# Patient Record
Sex: Female | Born: 1945 | Race: White | Hispanic: No | Marital: Married | Smoking: Light tobacco smoker
Health system: Southern US, Community
[De-identification: ages and names within clinical notes are randomized; demographics above are authoritative.]

## PROBLEM LIST (undated history)

## (undated) DIAGNOSIS — I739 Peripheral vascular disease, unspecified: Secondary | ICD-10-CM

## (undated) DIAGNOSIS — I499 Cardiac arrhythmia, unspecified: Secondary | ICD-10-CM

## (undated) DIAGNOSIS — Z8719 Personal history of other diseases of the digestive system: Secondary | ICD-10-CM

## (undated) DIAGNOSIS — N270 Small kidney, unilateral: Secondary | ICD-10-CM

## (undated) DIAGNOSIS — Z720 Tobacco use: Secondary | ICD-10-CM

## (undated) DIAGNOSIS — Z87442 Personal history of urinary calculi: Secondary | ICD-10-CM

## (undated) DIAGNOSIS — M1990 Unspecified osteoarthritis, unspecified site: Secondary | ICD-10-CM

## (undated) DIAGNOSIS — J189 Pneumonia, unspecified organism: Secondary | ICD-10-CM

## (undated) DIAGNOSIS — K219 Gastro-esophageal reflux disease without esophagitis: Secondary | ICD-10-CM

## (undated) DIAGNOSIS — I10 Essential (primary) hypertension: Secondary | ICD-10-CM

## (undated) HISTORY — PX: OTHER SURGICAL HISTORY: SHX169

## (undated) HISTORY — DX: Essential (primary) hypertension: I10

---

## 1967-10-17 HISTORY — PX: ECTOPIC PREGNANCY SURGERY: SHX613

## 1967-10-17 HISTORY — PX: APPENDECTOMY: SHX54

## 1968-10-16 HISTORY — PX: BREAST BIOPSY: SHX20

## 2006-04-25 ENCOUNTER — Emergency Department: Payer: Self-pay | Admitting: Emergency Medicine

## 2006-04-26 ENCOUNTER — Emergency Department: Payer: Self-pay | Admitting: Emergency Medicine

## 2006-05-03 ENCOUNTER — Ambulatory Visit: Payer: Self-pay | Admitting: Urology

## 2006-05-24 ENCOUNTER — Ambulatory Visit: Payer: Self-pay | Admitting: Urology

## 2006-09-12 ENCOUNTER — Ambulatory Visit: Payer: Self-pay | Admitting: Urology

## 2006-09-25 ENCOUNTER — Emergency Department: Payer: Self-pay | Admitting: General Practice

## 2007-06-12 ENCOUNTER — Ambulatory Visit: Payer: Self-pay | Admitting: Urology

## 2007-07-01 ENCOUNTER — Emergency Department: Payer: Self-pay | Admitting: Emergency Medicine

## 2011-01-16 ENCOUNTER — Ambulatory Visit: Payer: Self-pay | Admitting: Urology

## 2011-10-09 ENCOUNTER — Other Ambulatory Visit: Payer: Self-pay | Admitting: Internal Medicine

## 2011-11-08 ENCOUNTER — Ambulatory Visit: Payer: Self-pay | Admitting: Internal Medicine

## 2014-02-02 ENCOUNTER — Ambulatory Visit: Payer: Self-pay | Admitting: Physician Assistant

## 2014-02-16 ENCOUNTER — Other Ambulatory Visit: Payer: Self-pay | Admitting: Physician Assistant

## 2014-02-16 LAB — COMPREHENSIVE METABOLIC PANEL
Albumin: 3.9 g/dL (ref 3.4–5.0)
Alkaline Phosphatase: 99 U/L
Anion Gap: 2 — ABNORMAL LOW (ref 7–16)
BUN: 13 mg/dL (ref 7–18)
Bilirubin,Total: 0.3 mg/dL (ref 0.2–1.0)
Calcium, Total: 9.4 mg/dL (ref 8.5–10.1)
Chloride: 108 mmol/L — ABNORMAL HIGH (ref 98–107)
Co2: 29 mmol/L (ref 21–32)
Creatinine: 1.09 mg/dL (ref 0.60–1.30)
EGFR (African American): >60
EGFR (Non-African Amer.): 52 — ABNORMAL LOW
Glucose: 90 mg/dL (ref 65–99)
Osmolality: 277 (ref 275–301)
Potassium: 4.5 mmol/L (ref 3.5–5.1)
SGOT(AST): 21 U/L (ref 15–37)
SGPT (ALT): 15 U/L (ref 12–78)
Sodium: 139 mmol/L (ref 136–145)
Total Protein: 7.5 g/dL (ref 6.4–8.2)

## 2014-02-16 LAB — CBC WITH DIFFERENTIAL/PLATELET
Basophil #: 0 10*3/uL (ref 0.0–0.1)
Basophil %: 0.5 %
Eosinophil #: 0.2 10*3/uL (ref 0.0–0.7)
Eosinophil %: 3 %
HCT: 43.2 % (ref 35.0–47.0)
HGB: 14.5 g/dL (ref 12.0–16.0)
Lymphocyte #: 1.6 10*3/uL (ref 1.0–3.6)
Lymphocyte %: 22.8 %
MCH: 31.7 pg (ref 26.0–34.0)
MCHC: 33.6 g/dL (ref 32.0–36.0)
MCV: 94 fL (ref 80–100)
Monocyte #: 0.4 x10 3/mm (ref 0.2–0.9)
Monocyte %: 5.9 %
Neutrophil #: 4.7 10*3/uL (ref 1.4–6.5)
Neutrophil %: 67.8 %
Platelet: 267 10*3/uL (ref 150–440)
RBC: 4.59 10*6/uL (ref 3.80–5.20)
RDW: 14.3 % (ref 11.5–14.5)
WBC: 6.9 10*3/uL (ref 3.6–11.0)

## 2014-02-16 LAB — TSH: Thyroid Stimulating Horm: 1.67 u[IU]/mL

## 2014-02-20 LAB — LIPID PANEL
Cholesterol: 246 mg/dL — ABNORMAL HIGH (ref 0–200)
HDL Cholesterol: 60 mg/dL (ref 40–60)
Ldl Cholesterol, Calc: 168 mg/dL — ABNORMAL HIGH (ref 0–100)
Triglycerides: 90 mg/dL (ref 0–200)
VLDL Cholesterol, Calc: 18 mg/dL (ref 5–40)

## 2014-06-27 ENCOUNTER — Emergency Department: Payer: Self-pay | Admitting: Emergency Medicine

## 2014-06-27 LAB — COMPREHENSIVE METABOLIC PANEL
Albumin: 3.7 g/dL (ref 3.4–5.0)
Alkaline Phosphatase: 96 U/L
Anion Gap: 9 (ref 7–16)
BUN: 21 mg/dL — ABNORMAL HIGH (ref 7–18)
Bilirubin,Total: 0.3 mg/dL (ref 0.2–1.0)
Calcium, Total: 9.1 mg/dL (ref 8.5–10.1)
Chloride: 111 mmol/L — ABNORMAL HIGH (ref 98–107)
Co2: 20 mmol/L — ABNORMAL LOW (ref 21–32)
Creatinine: 0.95 mg/dL (ref 0.60–1.30)
EGFR (African American): >60
EGFR (Non-African Amer.): >60
Glucose: 106 mg/dL — ABNORMAL HIGH (ref 65–99)
Osmolality: 283 (ref 275–301)
Potassium: 3.9 mmol/L (ref 3.5–5.1)
SGOT(AST): 24 U/L (ref 15–37)
SGPT (ALT): 18 U/L
Sodium: 140 mmol/L (ref 136–145)
Total Protein: 7.5 g/dL (ref 6.4–8.2)

## 2014-06-27 LAB — CBC WITH DIFFERENTIAL/PLATELET
Basophil #: 0 10*3/uL (ref 0.0–0.1)
Basophil %: 0.3 %
Eosinophil #: 0.1 10*3/uL (ref 0.0–0.7)
Eosinophil %: 0.5 %
HCT: 42.2 % (ref 35.0–47.0)
HGB: 13.5 g/dL (ref 12.0–16.0)
Lymphocyte #: 1.2 10*3/uL (ref 1.0–3.6)
Lymphocyte %: 10 %
MCH: 30.5 pg (ref 26.0–34.0)
MCHC: 32 g/dL (ref 32.0–36.0)
MCV: 95 fL (ref 80–100)
Monocyte #: 0.5 x10 3/mm (ref 0.2–0.9)
Monocyte %: 4.1 %
Neutrophil #: 10 10*3/uL — ABNORMAL HIGH (ref 1.4–6.5)
Neutrophil %: 85.1 %
Platelet: 241 10*3/uL (ref 150–440)
RBC: 4.44 10*6/uL (ref 3.80–5.20)
RDW: 13.4 % (ref 11.5–14.5)
WBC: 11.8 10*3/uL — ABNORMAL HIGH (ref 3.6–11.0)

## 2014-06-27 LAB — D-DIMER(ARMC): D-Dimer: 730 ng/ml

## 2014-07-01 ENCOUNTER — Ambulatory Visit: Payer: Self-pay | Admitting: Vascular Surgery

## 2014-07-01 LAB — BASIC METABOLIC PANEL
Anion Gap: 7 (ref 7–16)
BUN: 11 mg/dL (ref 7–18)
Calcium, Total: 8.9 mg/dL (ref 8.5–10.1)
Chloride: 107 mmol/L (ref 98–107)
Co2: 25 mmol/L (ref 21–32)
Creatinine: 0.96 mg/dL (ref 0.60–1.30)
EGFR (African American): >60
EGFR (Non-African Amer.): >60
Glucose: 85 mg/dL (ref 65–99)
Osmolality: 276 (ref 275–301)
Potassium: 3.5 mmol/L (ref 3.5–5.1)
Sodium: 139 mmol/L (ref 136–145)

## 2014-07-02 LAB — CBC WITH DIFFERENTIAL/PLATELET
Basophil #: 0 10*3/uL (ref 0.0–0.1)
Basophil %: 0.3 %
Eosinophil #: 0.1 10*3/uL (ref 0.0–0.7)
Eosinophil %: 0.8 %
HCT: 33.7 % — ABNORMAL LOW (ref 35.0–47.0)
HGB: 11.4 g/dL — ABNORMAL LOW (ref 12.0–16.0)
Lymphocyte #: 1.2 10*3/uL (ref 1.0–3.6)
Lymphocyte %: 11.8 %
MCH: 31.8 pg (ref 26.0–34.0)
MCHC: 33.8 g/dL (ref 32.0–36.0)
MCV: 94 fL (ref 80–100)
Monocyte #: 0.6 x10 3/mm (ref 0.2–0.9)
Monocyte %: 5.8 %
Neutrophil #: 8.1 10*3/uL — ABNORMAL HIGH (ref 1.4–6.5)
Neutrophil %: 81.3 %
Platelet: 208 10*3/uL (ref 150–440)
RBC: 3.6 10*6/uL — ABNORMAL LOW (ref 3.80–5.20)
RDW: 13.4 % (ref 11.5–14.5)
WBC: 9.9 10*3/uL (ref 3.6–11.0)

## 2014-07-02 LAB — BASIC METABOLIC PANEL
Anion Gap: 6 — ABNORMAL LOW (ref 7–16)
BUN: 9 mg/dL (ref 7–18)
Calcium, Total: 8.5 mg/dL (ref 8.5–10.1)
Chloride: 110 mmol/L — ABNORMAL HIGH (ref 98–107)
Co2: 24 mmol/L (ref 21–32)
Creatinine: 0.74 mg/dL (ref 0.60–1.30)
EGFR (African American): >60
EGFR (Non-African Amer.): >60
Glucose: 98 mg/dL (ref 65–99)
Osmolality: 278 (ref 275–301)
Potassium: 3.4 mmol/L — ABNORMAL LOW (ref 3.5–5.1)
Sodium: 140 mmol/L (ref 136–145)

## 2014-07-22 ENCOUNTER — Ambulatory Visit: Payer: Self-pay | Admitting: Vascular Surgery

## 2014-07-22 LAB — CREATININE, SERUM
Creatinine: 1 mg/dL (ref 0.60–1.30)
EGFR (African American): >60
EGFR (Non-African Amer.): 58 — ABNORMAL LOW

## 2014-07-22 LAB — BUN: BUN: 13 mg/dL (ref 7–18)

## 2014-07-23 LAB — CBC WITH DIFFERENTIAL/PLATELET
Basophil #: 0 10*3/uL (ref 0.0–0.1)
Basophil %: 0.4 %
Eosinophil #: 0.1 10*3/uL (ref 0.0–0.7)
Eosinophil %: 0.5 %
HCT: 32.6 % — ABNORMAL LOW (ref 35.0–47.0)
HGB: 10.8 g/dL — ABNORMAL LOW (ref 12.0–16.0)
Lymphocyte #: 1.2 10*3/uL (ref 1.0–3.6)
Lymphocyte %: 10.2 %
MCH: 31.6 pg (ref 26.0–34.0)
MCHC: 33.3 g/dL (ref 32.0–36.0)
MCV: 95 fL (ref 80–100)
Monocyte #: 0.7 x10 3/mm (ref 0.2–0.9)
Monocyte %: 5.6 %
Neutrophil #: 10.1 10*3/uL — ABNORMAL HIGH (ref 1.4–6.5)
Neutrophil %: 83.3 %
Platelet: 260 10*3/uL (ref 150–440)
RBC: 3.42 10*6/uL — ABNORMAL LOW (ref 3.80–5.20)
RDW: 13.6 % (ref 11.5–14.5)
WBC: 12.1 10*3/uL — ABNORMAL HIGH (ref 3.6–11.0)

## 2014-07-23 LAB — BASIC METABOLIC PANEL
Anion Gap: 6 — ABNORMAL LOW (ref 7–16)
BUN: 16 mg/dL (ref 7–18)
Calcium, Total: 7.9 mg/dL — ABNORMAL LOW (ref 8.5–10.1)
Chloride: 106 mmol/L (ref 98–107)
Co2: 28 mmol/L (ref 21–32)
Creatinine: 1.09 mg/dL (ref 0.60–1.30)
EGFR (African American): >60
EGFR (Non-African Amer.): 53 — ABNORMAL LOW
Glucose: 94 mg/dL (ref 65–99)
Osmolality: 280 (ref 275–301)
Potassium: 4.2 mmol/L (ref 3.5–5.1)
Sodium: 140 mmol/L (ref 136–145)

## 2014-08-20 ENCOUNTER — Ambulatory Visit: Payer: Self-pay | Admitting: Vascular Surgery

## 2014-08-20 LAB — CREATININE, SERUM
Creatinine: 1.19 mg/dL (ref 0.60–1.30)
EGFR (African American): 58 — ABNORMAL LOW
EGFR (Non-African Amer.): 48 — ABNORMAL LOW

## 2014-08-20 LAB — BUN: BUN: 13 mg/dL (ref 7–18)

## 2014-10-27 DIAGNOSIS — F17210 Nicotine dependence, cigarettes, uncomplicated: Secondary | ICD-10-CM | POA: Diagnosis not present

## 2014-10-27 DIAGNOSIS — R011 Cardiac murmur, unspecified: Secondary | ICD-10-CM | POA: Diagnosis not present

## 2014-10-27 DIAGNOSIS — I10 Essential (primary) hypertension: Secondary | ICD-10-CM | POA: Diagnosis not present

## 2014-10-27 DIAGNOSIS — I70223 Atherosclerosis of native arteries of extremities with rest pain, bilateral legs: Secondary | ICD-10-CM | POA: Diagnosis not present

## 2014-11-06 ENCOUNTER — Inpatient Hospital Stay: Payer: Self-pay | Admitting: Vascular Surgery

## 2014-11-06 DIAGNOSIS — I743 Embolism and thrombosis of arteries of the lower extremities: Secondary | ICD-10-CM | POA: Diagnosis not present

## 2014-11-06 DIAGNOSIS — Z95820 Peripheral vascular angioplasty status with implants and grafts: Secondary | ICD-10-CM | POA: Diagnosis not present

## 2014-11-06 DIAGNOSIS — F17210 Nicotine dependence, cigarettes, uncomplicated: Secondary | ICD-10-CM | POA: Diagnosis not present

## 2014-11-06 DIAGNOSIS — Z87442 Personal history of urinary calculi: Secondary | ICD-10-CM | POA: Diagnosis not present

## 2014-11-06 DIAGNOSIS — K219 Gastro-esophageal reflux disease without esophagitis: Secondary | ICD-10-CM | POA: Diagnosis not present

## 2014-11-06 DIAGNOSIS — Z7902 Long term (current) use of antithrombotics/antiplatelets: Secondary | ICD-10-CM | POA: Diagnosis not present

## 2014-11-06 DIAGNOSIS — Z7982 Long term (current) use of aspirin: Secondary | ICD-10-CM | POA: Diagnosis not present

## 2014-11-06 DIAGNOSIS — I739 Peripheral vascular disease, unspecified: Secondary | ICD-10-CM | POA: Diagnosis not present

## 2014-11-06 DIAGNOSIS — M549 Dorsalgia, unspecified: Secondary | ICD-10-CM | POA: Diagnosis not present

## 2014-11-06 DIAGNOSIS — F17200 Nicotine dependence, unspecified, uncomplicated: Secondary | ICD-10-CM | POA: Diagnosis not present

## 2014-11-06 DIAGNOSIS — M79609 Pain in unspecified limb: Secondary | ICD-10-CM | POA: Diagnosis not present

## 2014-11-06 DIAGNOSIS — I70221 Atherosclerosis of native arteries of extremities with rest pain, right leg: Secondary | ICD-10-CM | POA: Diagnosis not present

## 2014-11-06 DIAGNOSIS — I10 Essential (primary) hypertension: Secondary | ICD-10-CM | POA: Diagnosis not present

## 2014-11-06 DIAGNOSIS — M79604 Pain in right leg: Secondary | ICD-10-CM | POA: Diagnosis not present

## 2014-11-06 DIAGNOSIS — M25559 Pain in unspecified hip: Secondary | ICD-10-CM | POA: Diagnosis not present

## 2014-11-06 DIAGNOSIS — I70223 Atherosclerosis of native arteries of extremities with rest pain, bilateral legs: Secondary | ICD-10-CM | POA: Diagnosis not present

## 2014-11-06 DIAGNOSIS — I70229 Atherosclerosis of native arteries of extremities with rest pain, unspecified extremity: Secondary | ICD-10-CM | POA: Diagnosis not present

## 2014-11-06 LAB — COMPREHENSIVE METABOLIC PANEL
Albumin: 3.5 g/dL (ref 3.4–5.0)
Alkaline Phosphatase: 119 U/L — ABNORMAL HIGH
Anion Gap: 11 (ref 7–16)
BUN: 22 mg/dL — ABNORMAL HIGH (ref 7–18)
Bilirubin,Total: 0.3 mg/dL (ref 0.2–1.0)
Calcium, Total: 9.2 mg/dL (ref 8.5–10.1)
Chloride: 98 mmol/L (ref 98–107)
Co2: 22 mmol/L (ref 21–32)
Creatinine: 1.13 mg/dL (ref 0.60–1.30)
EGFR (African American): >60
EGFR (Non-African Amer.): 51 — ABNORMAL LOW
Glucose: 177 mg/dL — ABNORMAL HIGH (ref 65–99)
Osmolality: 270 (ref 275–301)
Potassium: 3.6 mmol/L (ref 3.5–5.1)
SGOT(AST): 21 U/L (ref 15–37)
SGPT (ALT): 15 U/L
Sodium: 131 mmol/L — ABNORMAL LOW (ref 136–145)
Total Protein: 7.1 g/dL (ref 6.4–8.2)

## 2014-11-06 LAB — CBC
HCT: 37.4 % (ref 35.0–47.0)
HGB: 11.9 g/dL — ABNORMAL LOW (ref 12.0–16.0)
MCH: 28.3 pg (ref 26.0–34.0)
MCHC: 31.9 g/dL — ABNORMAL LOW (ref 32.0–36.0)
MCV: 89 fL (ref 80–100)
Platelet: 282 10*3/uL (ref 150–440)
RBC: 4.21 10*6/uL (ref 3.80–5.20)
RDW: 15.4 % — ABNORMAL HIGH (ref 11.5–14.5)
WBC: 14.5 10*3/uL — ABNORMAL HIGH (ref 3.6–11.0)

## 2014-11-06 LAB — PROTIME-INR
INR: 1.1
Prothrombin Time: 13.6 secs (ref 11.5–14.7)

## 2014-11-06 LAB — APTT
Activated PTT: 31.1 secs (ref 23.6–35.9)
Activated PTT: 42.2 secs — ABNORMAL HIGH (ref 23.6–35.9)

## 2014-11-06 LAB — HEPARIN LEVEL (UNFRACTIONATED): Anti-Xa(Unfractionated): >1.1 IU/mL (ref 0.30–0.70)

## 2014-11-07 LAB — CBC WITH DIFFERENTIAL/PLATELET
Basophil #: 0 10*3/uL (ref 0.0–0.1)
Basophil %: 0.2 %
Eosinophil #: 0 10*3/uL (ref 0.0–0.7)
Eosinophil %: 0.2 %
HCT: 31.9 % — ABNORMAL LOW (ref 35.0–47.0)
HGB: 10.3 g/dL — ABNORMAL LOW (ref 12.0–16.0)
Lymphocyte #: 0.7 10*3/uL — ABNORMAL LOW (ref 1.0–3.6)
Lymphocyte %: 5.7 %
MCH: 28.5 pg (ref 26.0–34.0)
MCHC: 32.2 g/dL (ref 32.0–36.0)
MCV: 89 fL (ref 80–100)
Monocyte #: 0.6 x10 3/mm (ref 0.2–0.9)
Monocyte %: 4.4 %
Neutrophil #: 11.7 10*3/uL — ABNORMAL HIGH (ref 1.4–6.5)
Neutrophil %: 89.5 %
Platelet: 190 10*3/uL (ref 150–440)
RBC: 3.6 10*6/uL — ABNORMAL LOW (ref 3.80–5.20)
RDW: 15.9 % — ABNORMAL HIGH (ref 11.5–14.5)
WBC: 13.1 10*3/uL — ABNORMAL HIGH (ref 3.6–11.0)

## 2014-11-07 LAB — BASIC METABOLIC PANEL
Anion Gap: 9 (ref 7–16)
BUN: 24 mg/dL — ABNORMAL HIGH (ref 7–18)
Calcium, Total: 8.6 mg/dL (ref 8.5–10.1)
Chloride: 104 mmol/L (ref 98–107)
Co2: 22 mmol/L (ref 21–32)
Creatinine: 1.02 mg/dL (ref 0.60–1.30)
EGFR (African American): >60
EGFR (Non-African Amer.): 57 — ABNORMAL LOW
Glucose: 107 mg/dL — ABNORMAL HIGH (ref 65–99)
Osmolality: 275 (ref 275–301)
Potassium: 4.1 mmol/L (ref 3.5–5.1)
Sodium: 135 mmol/L — ABNORMAL LOW (ref 136–145)

## 2014-11-08 LAB — CBC WITH DIFFERENTIAL/PLATELET
Basophil #: 0 10*3/uL (ref 0.0–0.1)
Basophil %: 0.4 %
Eosinophil #: 0.1 10*3/uL (ref 0.0–0.7)
Eosinophil %: 0.6 %
HCT: 26 % — ABNORMAL LOW (ref 35.0–47.0)
HGB: 8.4 g/dL — ABNORMAL LOW (ref 12.0–16.0)
Lymphocyte #: 1.1 10*3/uL (ref 1.0–3.6)
Lymphocyte %: 12.9 %
MCH: 28.9 pg (ref 26.0–34.0)
MCHC: 32.2 g/dL (ref 32.0–36.0)
MCV: 90 fL (ref 80–100)
Monocyte #: 0.7 x10 3/mm (ref 0.2–0.9)
Monocyte %: 8.1 %
Neutrophil #: 6.9 10*3/uL — ABNORMAL HIGH (ref 1.4–6.5)
Neutrophil %: 78 %
Platelet: 154 10*3/uL (ref 150–440)
RBC: 2.89 10*6/uL — ABNORMAL LOW (ref 3.80–5.20)
RDW: 15.8 % — ABNORMAL HIGH (ref 11.5–14.5)
WBC: 8.9 10*3/uL (ref 3.6–11.0)

## 2014-11-08 LAB — BASIC METABOLIC PANEL
Anion Gap: 8 (ref 7–16)
BUN: 16 mg/dL (ref 7–18)
Calcium, Total: 7.8 mg/dL — ABNORMAL LOW (ref 8.5–10.1)
Chloride: 108 mmol/L — ABNORMAL HIGH (ref 98–107)
Co2: 22 mmol/L (ref 21–32)
Creatinine: 1.13 mg/dL (ref 0.60–1.30)
EGFR (African American): >60
EGFR (Non-African Amer.): 51 — ABNORMAL LOW
Glucose: 83 mg/dL (ref 65–99)
Osmolality: 276 (ref 275–301)
Potassium: 3.2 mmol/L — ABNORMAL LOW (ref 3.5–5.1)
Sodium: 138 mmol/L (ref 136–145)

## 2014-11-27 DIAGNOSIS — I70213 Atherosclerosis of native arteries of extremities with intermittent claudication, bilateral legs: Secondary | ICD-10-CM | POA: Diagnosis not present

## 2014-11-27 DIAGNOSIS — I10 Essential (primary) hypertension: Secondary | ICD-10-CM | POA: Diagnosis not present

## 2014-11-27 DIAGNOSIS — F17200 Nicotine dependence, unspecified, uncomplicated: Secondary | ICD-10-CM | POA: Diagnosis not present

## 2015-01-07 ENCOUNTER — Inpatient Hospital Stay: Payer: Self-pay | Admitting: Vascular Surgery

## 2015-01-07 DIAGNOSIS — Z7902 Long term (current) use of antithrombotics/antiplatelets: Secondary | ICD-10-CM | POA: Diagnosis not present

## 2015-01-07 DIAGNOSIS — I70213 Atherosclerosis of native arteries of extremities with intermittent claudication, bilateral legs: Secondary | ICD-10-CM | POA: Diagnosis not present

## 2015-01-07 DIAGNOSIS — Z79891 Long term (current) use of opiate analgesic: Secondary | ICD-10-CM | POA: Diagnosis not present

## 2015-01-07 DIAGNOSIS — F4310 Post-traumatic stress disorder, unspecified: Secondary | ICD-10-CM | POA: Diagnosis not present

## 2015-01-07 DIAGNOSIS — D649 Anemia, unspecified: Secondary | ICD-10-CM | POA: Diagnosis not present

## 2015-01-07 DIAGNOSIS — E785 Hyperlipidemia, unspecified: Secondary | ICD-10-CM | POA: Diagnosis not present

## 2015-01-07 DIAGNOSIS — D500 Iron deficiency anemia secondary to blood loss (chronic): Secondary | ICD-10-CM | POA: Diagnosis not present

## 2015-01-07 DIAGNOSIS — I129 Hypertensive chronic kidney disease with stage 1 through stage 4 chronic kidney disease, or unspecified chronic kidney disease: Secondary | ICD-10-CM | POA: Diagnosis not present

## 2015-01-07 DIAGNOSIS — K219 Gastro-esophageal reflux disease without esophagitis: Secondary | ICD-10-CM | POA: Diagnosis not present

## 2015-01-07 DIAGNOSIS — N200 Calculus of kidney: Secondary | ICD-10-CM | POA: Diagnosis not present

## 2015-01-07 DIAGNOSIS — Z87891 Personal history of nicotine dependence: Secondary | ICD-10-CM | POA: Diagnosis not present

## 2015-01-07 DIAGNOSIS — S0990XA Unspecified injury of head, initial encounter: Secondary | ICD-10-CM | POA: Diagnosis not present

## 2015-01-07 DIAGNOSIS — Z79899 Other long term (current) drug therapy: Secondary | ICD-10-CM | POA: Diagnosis not present

## 2015-01-07 DIAGNOSIS — E780 Pure hypercholesterolemia: Secondary | ICD-10-CM | POA: Diagnosis not present

## 2015-01-07 DIAGNOSIS — I743 Embolism and thrombosis of arteries of the lower extremities: Secondary | ICD-10-CM | POA: Diagnosis not present

## 2015-01-07 DIAGNOSIS — Z87442 Personal history of urinary calculi: Secondary | ICD-10-CM | POA: Diagnosis not present

## 2015-01-07 DIAGNOSIS — N186 End stage renal disease: Secondary | ICD-10-CM | POA: Diagnosis not present

## 2015-01-07 DIAGNOSIS — Z452 Encounter for adjustment and management of vascular access device: Secondary | ICD-10-CM | POA: Diagnosis not present

## 2015-01-07 DIAGNOSIS — N183 Chronic kidney disease, stage 3 (moderate): Secondary | ICD-10-CM | POA: Diagnosis not present

## 2015-01-07 DIAGNOSIS — I998 Other disorder of circulatory system: Secondary | ICD-10-CM | POA: Diagnosis not present

## 2015-01-07 DIAGNOSIS — I70221 Atherosclerosis of native arteries of extremities with rest pain, right leg: Secondary | ICD-10-CM | POA: Diagnosis not present

## 2015-01-07 DIAGNOSIS — N189 Chronic kidney disease, unspecified: Secondary | ICD-10-CM | POA: Diagnosis not present

## 2015-01-07 DIAGNOSIS — S0181XA Laceration without foreign body of other part of head, initial encounter: Secondary | ICD-10-CM | POA: Diagnosis not present

## 2015-01-07 DIAGNOSIS — J984 Other disorders of lung: Secondary | ICD-10-CM | POA: Diagnosis not present

## 2015-01-07 DIAGNOSIS — I70222 Atherosclerosis of native arteries of extremities with rest pain, left leg: Secondary | ICD-10-CM | POA: Diagnosis not present

## 2015-01-07 DIAGNOSIS — Z7982 Long term (current) use of aspirin: Secondary | ICD-10-CM | POA: Diagnosis not present

## 2015-01-07 DIAGNOSIS — M1990 Unspecified osteoarthritis, unspecified site: Secondary | ICD-10-CM | POA: Diagnosis not present

## 2015-01-07 DIAGNOSIS — I10 Essential (primary) hypertension: Secondary | ICD-10-CM | POA: Diagnosis not present

## 2015-01-07 DIAGNOSIS — I70223 Atherosclerosis of native arteries of extremities with rest pain, bilateral legs: Secondary | ICD-10-CM | POA: Diagnosis not present

## 2015-01-07 DIAGNOSIS — I6522 Occlusion and stenosis of left carotid artery: Secondary | ICD-10-CM | POA: Diagnosis not present

## 2015-01-07 DIAGNOSIS — D62 Acute posthemorrhagic anemia: Secondary | ICD-10-CM | POA: Diagnosis not present

## 2015-01-07 DIAGNOSIS — N179 Acute kidney failure, unspecified: Secondary | ICD-10-CM | POA: Diagnosis not present

## 2015-01-07 DIAGNOSIS — I7779 Dissection of other artery: Secondary | ICD-10-CM | POA: Diagnosis not present

## 2015-01-07 DIAGNOSIS — Z8249 Family history of ischemic heart disease and other diseases of the circulatory system: Secondary | ICD-10-CM | POA: Diagnosis not present

## 2015-01-07 DIAGNOSIS — Z8673 Personal history of transient ischemic attack (TIA), and cerebral infarction without residual deficits: Secondary | ICD-10-CM | POA: Diagnosis not present

## 2015-01-07 DIAGNOSIS — I739 Peripheral vascular disease, unspecified: Secondary | ICD-10-CM | POA: Diagnosis not present

## 2015-01-07 LAB — BASIC METABOLIC PANEL WITH GFR
Anion Gap: 9
BUN: 25 mg/dL — ABNORMAL HIGH
Calcium, Total: 9.1 mg/dL
Chloride: 103 mmol/L
Co2: 20 mmol/L — ABNORMAL LOW
Creatinine: 1.25 mg/dL — ABNORMAL HIGH
EGFR (African American): 51 — ABNORMAL LOW
EGFR (Non-African Amer.): 44 — ABNORMAL LOW
Glucose: 116 mg/dL — ABNORMAL HIGH
Potassium: 3.5 mmol/L
Sodium: 132 mmol/L — ABNORMAL LOW

## 2015-01-07 LAB — CBC
HCT: 32.1 % — ABNORMAL LOW
HGB: 10.3 g/dL — ABNORMAL LOW
MCH: 28.5 pg
MCHC: 32 g/dL
MCV: 89 fL
Platelet: 265 x10 3/mm 3
RBC: 3.61 X10 6/mm 3 — ABNORMAL LOW
RDW: 15.3 % — ABNORMAL HIGH
WBC: 7.9 x10 3/mm 3

## 2015-01-07 LAB — PROTIME-INR
INR: 1
Prothrombin Time: 13.3 secs

## 2015-01-07 LAB — APTT: Activated PTT: 30.2 secs (ref 23.6–35.9)

## 2015-01-07 LAB — HEPARIN LEVEL (UNFRACTIONATED): Anti-Xa(Unfractionated): 0.38 IU/mL (ref 0.30–0.70)

## 2015-01-08 LAB — CBC WITH DIFFERENTIAL/PLATELET
Basophil #: 0 10*3/uL (ref 0.0–0.1)
Basophil #: 0 10*3/uL (ref 0.0–0.1)
Basophil %: 0.6 %
Basophil %: 0.6 %
Eosinophil #: 0.1 10*3/uL (ref 0.0–0.7)
Eosinophil #: 0.1 10*3/uL (ref 0.0–0.7)
Eosinophil %: 1.7 %
Eosinophil %: 1.9 %
HCT: 24.9 % — ABNORMAL LOW (ref 35.0–47.0)
HCT: 22.7 % — ABNORMAL LOW (ref 35.0–47.0)
HGB: 7.9 g/dL — ABNORMAL LOW (ref 12.0–16.0)
HGB: 7.3 g/dL — ABNORMAL LOW (ref 12.0–16.0)
Lymphocyte #: 1 10*3/uL (ref 1.0–3.6)
Lymphocyte #: 1.1 10*3/uL (ref 1.0–3.6)
Lymphocyte %: 15.4 %
Lymphocyte %: 18.9 %
MCH: 28.1 pg (ref 26.0–34.0)
MCH: 28.2 pg (ref 26.0–34.0)
MCHC: 31.8 g/dL — ABNORMAL LOW (ref 32.0–36.0)
MCHC: 32 g/dL (ref 32.0–36.0)
MCV: 88 fL (ref 80–100)
MCV: 88 fL (ref 80–100)
Monocyte #: 0.4 x10 3/mm (ref 0.2–0.9)
Monocyte #: 0.4 x10 3/mm (ref 0.2–0.9)
Monocyte %: 6.2 %
Monocyte %: 7 %
Neutrophil #: 4.9 10*3/uL (ref 1.4–6.5)
Neutrophil #: 4.1 10*3/uL (ref 1.4–6.5)
Neutrophil %: 76.1 %
Neutrophil %: 71.6 %
Platelet: 198 10*3/uL (ref 150–440)
Platelet: 180 10*3/uL (ref 150–440)
RBC: 2.81 10*6/uL — ABNORMAL LOW (ref 3.80–5.20)
RBC: 2.57 10*6/uL — ABNORMAL LOW (ref 3.80–5.20)
RDW: 15 % — ABNORMAL HIGH (ref 11.5–14.5)
RDW: 15.1 % — ABNORMAL HIGH (ref 11.5–14.5)
WBC: 6.4 10*3/uL (ref 3.6–11.0)
WBC: 5.7 10*3/uL (ref 3.6–11.0)

## 2015-01-08 LAB — BASIC METABOLIC PANEL
Anion Gap: 4 — ABNORMAL LOW (ref 7–16)
BUN: 20 mg/dL
Calcium, Total: 8.2 mg/dL — ABNORMAL LOW
Chloride: 105 mmol/L
Co2: 25 mmol/L
Creatinine: 1.14 mg/dL — ABNORMAL HIGH
EGFR (African American): 57 — ABNORMAL LOW
EGFR (Non-African Amer.): 49 — ABNORMAL LOW
Glucose: 97 mg/dL
Potassium: 3.5 mmol/L
Sodium: 134 mmol/L — ABNORMAL LOW

## 2015-01-08 LAB — HEPARIN LEVEL (UNFRACTIONATED): Anti-Xa(Unfractionated): 0.58 IU/mL (ref 0.30–0.70)

## 2015-01-09 LAB — CBC WITH DIFFERENTIAL/PLATELET
Basophil #: 0 10*3/uL (ref 0.0–0.1)
Basophil %: 0.3 %
Eosinophil #: 0 10*3/uL (ref 0.0–0.7)
Eosinophil %: 0.3 %
HCT: 20.3 % — ABNORMAL LOW (ref 35.0–47.0)
HGB: 6.5 g/dL — ABNORMAL LOW (ref 12.0–16.0)
Lymphocyte #: 1.3 10*3/uL (ref 1.0–3.6)
Lymphocyte %: 18.4 %
MCH: 28.3 pg (ref 26.0–34.0)
MCHC: 32 g/dL (ref 32.0–36.0)
MCV: 88 fL (ref 80–100)
Monocyte #: 0.5 x10 3/mm (ref 0.2–0.9)
Monocyte %: 7.3 %
Neutrophil #: 5.3 10*3/uL (ref 1.4–6.5)
Neutrophil %: 73.7 %
Platelet: 171 10*3/uL (ref 150–440)
RBC: 2.3 10*6/uL — ABNORMAL LOW (ref 3.80–5.20)
RDW: 15 % — ABNORMAL HIGH (ref 11.5–14.5)
WBC: 7.2 10*3/uL (ref 3.6–11.0)

## 2015-01-09 LAB — URINALYSIS, COMPLETE
Bilirubin,UR: NEGATIVE
Glucose,UR: NEGATIVE mg/dL (ref 0–75)
Hyaline Cast: 2
Nitrite: NEGATIVE
Ph: 5 (ref 4.5–8.0)
Protein: NEGATIVE
RBC,UR: 5 /HPF (ref 0–5)
Specific Gravity: 1.012 (ref 1.003–1.030)
Squamous Epithelial: NONE SEEN
WBC UR: 12 /HPF (ref 0–5)

## 2015-01-09 LAB — PROTEIN / CREATININE RATIO, URINE
Creatinine, Urine Random: 63 mg/dL (ref 30–125)
Protein, Urine: <6 mg/dL — ABNORMAL LOW (ref 0–9)

## 2015-01-09 LAB — HEPARIN LEVEL (UNFRACTIONATED)
Anti-Xa(Unfractionated): 0.13 IU/mL — ABNORMAL LOW (ref 0.30–0.70)
Anti-Xa(Unfractionated): 0.29 IU/mL — ABNORMAL LOW (ref 0.30–0.70)

## 2015-01-10 LAB — CBC WITH DIFFERENTIAL/PLATELET
Basophil #: 0 10*3/uL (ref 0.0–0.1)
Basophil %: 0.6 %
Eosinophil #: 0.3 10*3/uL (ref 0.0–0.7)
Eosinophil %: 3.9 %
HCT: 25.7 % — ABNORMAL LOW (ref 35.0–47.0)
HGB: 8.5 g/dL — ABNORMAL LOW (ref 12.0–16.0)
Lymphocyte #: 1.6 10*3/uL (ref 1.0–3.6)
Lymphocyte %: 23.4 %
MCH: 28.4 pg (ref 26.0–34.0)
MCHC: 33.1 g/dL (ref 32.0–36.0)
MCV: 86 fL (ref 80–100)
Monocyte #: 0.5 x10 3/mm (ref 0.2–0.9)
Monocyte %: 7.9 %
Neutrophil #: 4.4 10*3/uL (ref 1.4–6.5)
Neutrophil %: 64.2 %
Platelet: 144 10*3/uL — ABNORMAL LOW (ref 150–440)
RBC: 3 10*6/uL — ABNORMAL LOW (ref 3.80–5.20)
RDW: 16.3 % — ABNORMAL HIGH (ref 11.5–14.5)
WBC: 6.9 10*3/uL (ref 3.6–11.0)

## 2015-01-10 LAB — HEPARIN LEVEL (UNFRACTIONATED)
Anti-Xa(Unfractionated): 0.52 IU/mL (ref 0.30–0.70)
Anti-Xa(Unfractionated): 0.35 IU/mL (ref 0.30–0.70)

## 2015-01-10 LAB — PROTIME-INR
INR: 1.2
Prothrombin Time: 15 secs

## 2015-01-10 LAB — CREATININE, SERUM
Creatinine: 0.84 mg/dL
EGFR (African American): >60
EGFR (Non-African Amer.): >60

## 2015-01-11 LAB — HEPATIC FUNCTION PANEL A (ARMC)
Albumin: 2.9 g/dL — ABNORMAL LOW
Albumin: 3 g/dL — ABNORMAL LOW
Alkaline Phosphatase: 86 U/L
Alkaline Phosphatase: 109 U/L
Bilirubin, Direct: 0.1 mg/dL
Bilirubin, Direct: 0.1 mg/dL
Bilirubin,Total: 0.4 mg/dL
Bilirubin,Total: 0.5 mg/dL
Indirect Bilirubin: 0.3
Indirect Bilirubin: 0.4
SGOT(AST): 21 U/L
SGOT(AST): 28 U/L
SGPT (ALT): 10 U/L — ABNORMAL LOW
SGPT (ALT): 14 U/L
Total Protein: 5.5 g/dL — ABNORMAL LOW
Total Protein: 6.3 g/dL — ABNORMAL LOW

## 2015-01-11 LAB — CBC WITH DIFFERENTIAL/PLATELET
Basophil #: 0 10*3/uL (ref 0.0–0.1)
Basophil %: 0.6 %
Eosinophil #: 0.4 10*3/uL (ref 0.0–0.7)
Eosinophil %: 5.8 %
HCT: 25.5 % — ABNORMAL LOW (ref 35.0–47.0)
HGB: 8.4 g/dL — ABNORMAL LOW (ref 12.0–16.0)
Lymphocyte #: 1.7 10*3/uL (ref 1.0–3.6)
Lymphocyte %: 26.6 %
MCH: 28.6 pg (ref 26.0–34.0)
MCHC: 33.1 g/dL (ref 32.0–36.0)
MCV: 86 fL (ref 80–100)
Monocyte #: 0.6 x10 3/mm (ref 0.2–0.9)
Monocyte %: 8.4 %
Neutrophil #: 3.8 10*3/uL (ref 1.4–6.5)
Neutrophil %: 58.6 %
Platelet: 159 10*3/uL (ref 150–440)
RBC: 2.95 10*6/uL — ABNORMAL LOW (ref 3.80–5.20)
RDW: 16.3 % — ABNORMAL HIGH (ref 11.5–14.5)
WBC: 6.5 10*3/uL (ref 3.6–11.0)

## 2015-01-11 LAB — PROTIME-INR
INR: 2.5
INR: 2.7
Prothrombin Time: 27.1 secs — ABNORMAL HIGH
Prothrombin Time: 28.7 secs — ABNORMAL HIGH

## 2015-01-11 LAB — HEPARIN LEVEL (UNFRACTIONATED): Anti-Xa(Unfractionated): 0.44 IU/mL (ref 0.30–0.70)

## 2015-01-12 LAB — CBC WITH DIFFERENTIAL/PLATELET
Basophil #: 0.1 10*3/uL (ref 0.0–0.1)
Basophil %: 0.7 %
Eosinophil #: 0.4 10*3/uL (ref 0.0–0.7)
Eosinophil %: 5.5 %
HCT: 25.5 % — ABNORMAL LOW (ref 35.0–47.0)
HGB: 8.3 g/dL — ABNORMAL LOW (ref 12.0–16.0)
Lymphocyte #: 1.3 10*3/uL (ref 1.0–3.6)
Lymphocyte %: 18.2 %
MCH: 28.4 pg (ref 26.0–34.0)
MCHC: 32.7 g/dL (ref 32.0–36.0)
MCV: 87 fL (ref 80–100)
Monocyte #: 0.5 x10 3/mm (ref 0.2–0.9)
Monocyte %: 7.7 %
Neutrophil #: 4.9 10*3/uL (ref 1.4–6.5)
Neutrophil %: 67.9 %
Platelet: 190 10*3/uL (ref 150–440)
RBC: 2.93 10*6/uL — ABNORMAL LOW (ref 3.80–5.20)
RDW: 16.5 % — ABNORMAL HIGH (ref 11.5–14.5)
WBC: 7.2 10*3/uL (ref 3.6–11.0)

## 2015-01-12 LAB — PROTIME-INR
INR: 3.1
Prothrombin Time: 31.7 secs — ABNORMAL HIGH

## 2015-01-12 LAB — HEPARIN LEVEL (UNFRACTIONATED): Anti-Xa(Unfractionated): 0.19 IU/mL — ABNORMAL LOW (ref 0.30–0.70)

## 2015-01-15 HISTORY — PX: ABOVE KNEE LEG AMPUTATION: SUR20

## 2015-02-03 ENCOUNTER — Inpatient Hospital Stay
Admit: 2015-02-03 | Disposition: A | Discharge status: Home / Self Care | Payer: Self-pay | Attending: Vascular Surgery | Admitting: Vascular Surgery

## 2015-02-03 DIAGNOSIS — I70301 Unspecified atherosclerosis of unspecified type of bypass graft(s) of the extremities, right leg: Secondary | ICD-10-CM | POA: Diagnosis not present

## 2015-02-03 DIAGNOSIS — T82898A Other specified complication of vascular prosthetic devices, implants and grafts, initial encounter: Secondary | ICD-10-CM | POA: Diagnosis not present

## 2015-02-03 DIAGNOSIS — D62 Acute posthemorrhagic anemia: Secondary | ICD-10-CM | POA: Diagnosis not present

## 2015-02-03 DIAGNOSIS — Z4889 Encounter for other specified surgical aftercare: Secondary | ICD-10-CM | POA: Diagnosis not present

## 2015-02-03 DIAGNOSIS — K219 Gastro-esophageal reflux disease without esophagitis: Secondary | ICD-10-CM | POA: Diagnosis not present

## 2015-02-03 DIAGNOSIS — T82868S Thrombosis of vascular prosthetic devices, implants and grafts, sequela: Secondary | ICD-10-CM | POA: Diagnosis not present

## 2015-02-03 DIAGNOSIS — D649 Anemia, unspecified: Secondary | ICD-10-CM | POA: Diagnosis not present

## 2015-02-03 DIAGNOSIS — Z87891 Personal history of nicotine dependence: Secondary | ICD-10-CM | POA: Diagnosis not present

## 2015-02-03 DIAGNOSIS — I70221 Atherosclerosis of native arteries of extremities with rest pain, right leg: Secondary | ICD-10-CM | POA: Diagnosis not present

## 2015-02-03 DIAGNOSIS — Z7901 Long term (current) use of anticoagulants: Secondary | ICD-10-CM | POA: Diagnosis not present

## 2015-02-03 DIAGNOSIS — T82868A Thrombosis of vascular prosthetic devices, implants and grafts, initial encounter: Secondary | ICD-10-CM | POA: Diagnosis not present

## 2015-02-03 DIAGNOSIS — I70261 Atherosclerosis of native arteries of extremities with gangrene, right leg: Secondary | ICD-10-CM | POA: Diagnosis not present

## 2015-02-03 DIAGNOSIS — Z7982 Long term (current) use of aspirin: Secondary | ICD-10-CM | POA: Diagnosis not present

## 2015-02-03 DIAGNOSIS — N189 Chronic kidney disease, unspecified: Secondary | ICD-10-CM | POA: Diagnosis not present

## 2015-02-03 DIAGNOSIS — Z89611 Acquired absence of right leg above knee: Secondary | ICD-10-CM | POA: Diagnosis not present

## 2015-02-03 DIAGNOSIS — Z79899 Other long term (current) drug therapy: Secondary | ICD-10-CM | POA: Diagnosis not present

## 2015-02-03 DIAGNOSIS — I998 Other disorder of circulatory system: Secondary | ICD-10-CM | POA: Diagnosis not present

## 2015-02-03 DIAGNOSIS — Z87442 Personal history of urinary calculi: Secondary | ICD-10-CM | POA: Diagnosis not present

## 2015-02-03 DIAGNOSIS — I129 Hypertensive chronic kidney disease with stage 1 through stage 4 chronic kidney disease, or unspecified chronic kidney disease: Secondary | ICD-10-CM | POA: Diagnosis not present

## 2015-02-03 DIAGNOSIS — I739 Peripheral vascular disease, unspecified: Secondary | ICD-10-CM | POA: Diagnosis not present

## 2015-02-03 DIAGNOSIS — I824Y1 Acute embolism and thrombosis of unspecified deep veins of right proximal lower extremity: Secondary | ICD-10-CM | POA: Diagnosis not present

## 2015-02-03 LAB — CBC WITH DIFFERENTIAL/PLATELET
Basophil #: 0.1 10*3/uL (ref 0.0–0.1)
Basophil %: 0.8 %
Eosinophil #: 0.4 10*3/uL (ref 0.0–0.7)
Eosinophil %: 4.1 %
HCT: 34.1 % — ABNORMAL LOW (ref 35.0–47.0)
HGB: 11.1 g/dL — ABNORMAL LOW (ref 12.0–16.0)
Lymphocyte #: 2.3 10*3/uL (ref 1.0–3.6)
Lymphocyte %: 26.9 %
MCH: 29.2 pg (ref 26.0–34.0)
MCHC: 32.5 g/dL (ref 32.0–36.0)
MCV: 90 fL (ref 80–100)
Monocyte #: 0.6 x10 3/mm (ref 0.2–0.9)
Monocyte %: 7.3 %
Neutrophil #: 5.3 10*3/uL (ref 1.4–6.5)
Neutrophil %: 60.9 %
Platelet: 259 10*3/uL (ref 150–440)
RBC: 3.8 10*6/uL (ref 3.80–5.20)
RDW: 16.3 % — ABNORMAL HIGH (ref 11.5–14.5)
WBC: 8.7 10*3/uL (ref 3.6–11.0)

## 2015-02-03 LAB — COMPREHENSIVE METABOLIC PANEL
Albumin: 3.9 g/dL
Alkaline Phosphatase: 109 U/L
Anion Gap: 7 (ref 7–16)
BUN: 26 mg/dL — ABNORMAL HIGH
Bilirubin,Total: 0.3 mg/dL
Calcium, Total: 9.3 mg/dL
Chloride: 103 mmol/L
Co2: 25 mmol/L
Creatinine: 1.29 mg/dL — ABNORMAL HIGH
EGFR (African American): 49 — ABNORMAL LOW
EGFR (Non-African Amer.): 42 — ABNORMAL LOW
Glucose: 92 mg/dL
Potassium: 3.3 mmol/L — ABNORMAL LOW
SGOT(AST): 17 U/L
SGPT (ALT): 10 U/L — ABNORMAL LOW
Sodium: 135 mmol/L
Total Protein: 7.2 g/dL

## 2015-02-03 LAB — PROTIME-INR
INR: 1.3
Prothrombin Time: 16.2 secs — ABNORMAL HIGH

## 2015-02-03 LAB — HEPARIN LEVEL (UNFRACTIONATED)
Anti-Xa(Unfractionated): 0.96 IU/mL — ABNORMAL HIGH (ref 0.30–0.70)
Anti-Xa(Unfractionated): 0.39 IU/mL (ref 0.30–0.70)

## 2015-02-03 LAB — APTT: Activated PTT: 32.3 secs (ref 23.6–35.9)

## 2015-02-04 LAB — CBC WITH DIFFERENTIAL/PLATELET
Basophil #: 0 10*3/uL (ref 0.0–0.1)
Basophil %: 0.6 %
Eosinophil #: 0.2 10*3/uL (ref 0.0–0.7)
Eosinophil %: 3.2 %
HCT: 30.6 % — ABNORMAL LOW (ref 35.0–47.0)
HGB: 9.9 g/dL — ABNORMAL LOW (ref 12.0–16.0)
Lymphocyte #: 2.1 10*3/uL (ref 1.0–3.6)
Lymphocyte %: 28.2 %
MCH: 29.1 pg (ref 26.0–34.0)
MCHC: 32.4 g/dL (ref 32.0–36.0)
MCV: 90 fL (ref 80–100)
Monocyte #: 0.5 x10 3/mm (ref 0.2–0.9)
Monocyte %: 6 %
Neutrophil #: 4.7 10*3/uL (ref 1.4–6.5)
Neutrophil %: 62 %
Platelet: 205 10*3/uL (ref 150–440)
RBC: 3.41 10*6/uL — ABNORMAL LOW (ref 3.80–5.20)
RDW: 16.3 % — ABNORMAL HIGH (ref 11.5–14.5)
WBC: 7.6 10*3/uL (ref 3.6–11.0)

## 2015-02-04 LAB — HEPARIN LEVEL (UNFRACTIONATED): Anti-Xa(Unfractionated): 0.44 IU/mL (ref 0.30–0.70)

## 2015-02-05 DIAGNOSIS — I998 Other disorder of circulatory system: Secondary | ICD-10-CM | POA: Diagnosis not present

## 2015-02-05 DIAGNOSIS — T82868S Thrombosis of vascular prosthetic devices, implants and grafts, sequela: Secondary | ICD-10-CM | POA: Diagnosis not present

## 2015-02-05 LAB — CBC WITH DIFFERENTIAL/PLATELET
Basophil #: 0 10*3/uL (ref 0.0–0.1)
Basophil %: 0.3 %
Eosinophil #: 0 10*3/uL (ref 0.0–0.7)
Eosinophil %: 0.1 %
HCT: 24.5 % — ABNORMAL LOW (ref 35.0–47.0)
HGB: 7.9 g/dL — ABNORMAL LOW (ref 12.0–16.0)
Lymphocyte #: 1.2 10*3/uL (ref 1.0–3.6)
Lymphocyte %: 12.9 %
MCH: 29.1 pg (ref 26.0–34.0)
MCHC: 32.4 g/dL (ref 32.0–36.0)
MCV: 90 fL (ref 80–100)
Monocyte #: 0.6 x10 3/mm (ref 0.2–0.9)
Monocyte %: 6.8 %
Neutrophil #: 7.6 10*3/uL — ABNORMAL HIGH (ref 1.4–6.5)
Neutrophil %: 79.9 %
Platelet: 186 10*3/uL (ref 150–440)
RBC: 2.72 10*6/uL — ABNORMAL LOW (ref 3.80–5.20)
RDW: 16.1 % — ABNORMAL HIGH (ref 11.5–14.5)
WBC: 9.5 10*3/uL (ref 3.6–11.0)

## 2015-02-05 LAB — BASIC METABOLIC PANEL
Anion Gap: 6 — ABNORMAL LOW (ref 7–16)
BUN: 15 mg/dL
Calcium, Total: 8.3 mg/dL — ABNORMAL LOW
Chloride: 105 mmol/L
Co2: 24 mmol/L
Creatinine: 1.03 mg/dL — ABNORMAL HIGH
EGFR (African American): >60
EGFR (Non-African Amer.): 55 — ABNORMAL LOW
Glucose: 102 mg/dL — ABNORMAL HIGH
Potassium: 4.3 mmol/L
Sodium: 135 mmol/L

## 2015-02-05 LAB — HEPARIN LEVEL (UNFRACTIONATED): Anti-Xa(Unfractionated): <0.1 IU/mL — ABNORMAL LOW (ref 0.30–0.70)

## 2015-02-06 LAB — BASIC METABOLIC PANEL
Anion Gap: 4 — ABNORMAL LOW (ref 7–16)
BUN: 10 mg/dL
Calcium, Total: 7.8 mg/dL — ABNORMAL LOW
Chloride: 102 mmol/L
Co2: 25 mmol/L
Creatinine: 0.79 mg/dL
EGFR (African American): >60
EGFR (Non-African Amer.): >60
Glucose: 99 mg/dL
Potassium: 3.7 mmol/L
Sodium: 131 mmol/L — ABNORMAL LOW

## 2015-02-06 LAB — CBC WITH DIFFERENTIAL/PLATELET
Basophil #: 0 10*3/uL (ref 0.0–0.1)
Basophil %: 0.3 %
Eosinophil #: 0.1 10*3/uL (ref 0.0–0.7)
Eosinophil %: 1.2 %
HCT: 19.1 % — ABNORMAL LOW (ref 35.0–47.0)
HGB: 6.4 g/dL — ABNORMAL LOW (ref 12.0–16.0)
Lymphocyte #: 1.3 10*3/uL (ref 1.0–3.6)
Lymphocyte %: 15.4 %
MCH: 30.5 pg (ref 26.0–34.0)
MCHC: 33.7 g/dL (ref 32.0–36.0)
MCV: 91 fL (ref 80–100)
Monocyte #: 0.7 x10 3/mm (ref 0.2–0.9)
Monocyte %: 8.2 %
Neutrophil #: 6.1 10*3/uL (ref 1.4–6.5)
Neutrophil %: 74.9 %
Platelet: 160 10*3/uL (ref 150–440)
RBC: 2.11 10*6/uL — ABNORMAL LOW (ref 3.80–5.20)
RDW: 15.7 % — ABNORMAL HIGH (ref 11.5–14.5)
WBC: 8.2 10*3/uL (ref 3.6–11.0)

## 2015-02-06 NOTE — Op Note (Signed)
PATIENT NAME:  [name], [name] MR#:  [id number] DATE OF BIRTH:  [birth year]  DATE OF PROCEDURE:  08/20/2014  PREOPERATIVE DIAGNOSES:  1.  Peripheral arterial disease with rest pain left lower extremity.  2.  Status post aortoiliac intervention and right lower extremity revascularization.  3.  Gastroesophageal reflux disease.   POSTOPERATIVE DIAGNOSES:  1.  Peripheral arterial disease with rest pain left lower extremity.  2.  Status post aortoiliac intervention and right lower extremity revascularization.  3.  Gastroesophageal reflux disease.   PROCEDURES:   1. Ultrasound guidance for vascular access right femoral artery.  2. Catheter placement to left posterior tibial artery from right femoral approach.  3. Aortogram and selective left lower extremity angiogram.  4. Percutaneous transluminal angioplasty of multiple areas of stenosis in the left posterior tibial artery with 2.5 mm diameter angioplasty balloon.  5. Percutaneous transluminal angioplasty of left superficial femoral artery and above-knee popliteal artery with two 4 mm diameter x 15 cm length Lutonix drug-coated balloons.   6. Viabahn covered stent placement for greater than 50% residual stenosis and dissection of multiple areas of the left superficial femoral artery after angioplasty.  7. StarClose closure device, right femoral artery.   SURGEON: Annice Needy, MD.   ANESTHESIA: Local with moderate conscious sedation.   ESTIMATED BLOOD LOSS: 25 mL.   INDICATION FOR PROCEDURE: This is a 69 year old female with extensive peripheral vascular disease, she has undergone multiple previous interventions and has now resolved her right lower extremity rest pain. She still has rest pain in the left lower extremity and she is brought in for an attempted intervention on this side. Risks and benefits were discussed. Informed consent was obtained.   DESCRIPTION OF PROCEDURE: The patient was brought to the vascular suite. Groins were  shaved and prepped and a sterile surgical field was created. The right femoral head was localized with fluoroscopy and accessed under direct ultrasound guidance without difficulty with a Seldinger needle and a permanent image was recorded.  A 5 French sheath was placed. Pigtail catheter was placed in the aorta at the L1-L2 level. Her previous aortoiliac interventions appeared patent. I then crossed the aortic bifurcation and advanced to the left femoral head. This demonstrated what appeared to be a short segment of occlusion of the left profunda femoris artery, the SFA was patent over the first 4-5 cm of the vessel, but then occluded and reconstituted at [facility] canal in the above-knee popliteal artery. She then had tibial disease distally. The posterior tibial artery was her best runoff, but had multiple areas of stenosis. The patient was given intravenous heparin. A 6 French Ansell sheath was placed over a [employer] wire. I was able to gain access to the SFA and cross the occlusion with minimal difficulty and confirm intraluminal flow in the popliteal artery. At this point I exchanged for a 0.018 wire and crossed the posterior tibial artery all the way into the foot.  I used a 2.5 mm diameter angioplasty balloon inflated from the posterior tibial artery in the foot up into the tibioperoneal trunk and below knee popliteal artery. Multiple areas of waist were seen with inflation, which resolved with angioplasty and completion angiogram showed this to be markedly improved. I then turned my attention to the SFA and popliteal artery. I used two 4 mm diameter x 15 cm length Lutonix drug-coated balloons to inflate from the proximal SFA down to the above-knee popliteal artery. Multiple areas of waist were seen, which resolved with angioplasty, however after  angioplasty there were still several separate areas of residual stenosis and dissection that were flow-limiting. I elected to place a stent to treat this. I used  a 5 mm diameter x 25 cm length Viabahn covered stent from just below Hunter canal to the proximal superficial femoral artery, but making sure to stay 3-4 cm from away from the origin of the superficial femoral artery, not to encroach on the profunda femoris artery even though this was already occluded, but not placing a stent in this location, I can come back at a later date and try to intervene upon this profunda femoris artery or treat it surgically without impinging on the stent. This was post dilated with a 4 mm balloon with an excellent angiographic completion result and markedly improved flow.  I then pulled the sheath back to the ipsilateral external iliac artery and an oblique arteriogram was performed. StarClose closure device was deployed, pressure was held. Sterile dressing was placed. The patient tolerated the procedure well and was taken to the recovery room in stable condition.    ____________________________ Annice NeedyJason S. Dew, MD jsd:bu D: 08/20/2014 13:20:11 ET T: 08/20/2014 14:17:12 ET JOB#: [id number]  cc: Annice NeedyJason S. Dew, MD, <Dictator> Annice NeedyJASON S DEW MD ELECTRONICALLY SIGNED 09/17/2014 9:14

## 2015-02-06 NOTE — Op Note (Signed)
PATIENT NAME:  [name], [name] MR#:  [id number] DATE OF BIRTH:  [birth year]  DATE OF PROCEDURE:  07/22/2014  PREOPERATIVE DIAGNOSES: 1.  Peripheral arterial disease with rest pain, right lower extremity.  2. Status post bilateral common iliac artery stent placement for severe bilateral ischemic symptoms previously.   POSTOPERATIVE DIAGNOSES:  1.  Peripheral arterial disease with rest pain, right lower extremity.  2. Status post bilateral common iliac artery stent placement for severe bilateral ischemic symptoms previously.   PROCEDURE: 1.  Ultrasound guidance for vascular access, left femoral artery.  2.  Catheter placement into right popliteal artery from left femoral approach.  3.  Aortogram and selective right lower extremity angiogram.  4.  Catheter-directed thrombolysis with 4 mg of tPA to the right external iliac artery, common femoral artery, and superficial femoral artery.  5.  Mechanical rheolytic thrombectomy to right external iliac artery, common femoral artery, and superficial femoral artery.  6.  Percutaneous transluminal angioplasty of right superficial femoral artery with 4 mm diameter angioplasty balloon.  7.  Percutaneous transluminal angioplasty of common femoral artery with 4 mm diameter and 6 mm diameter angioplasty balloon.  8.  Percutaneous transluminal angioplasty of right external iliac artery with 6 mm diameter angioplasty balloon.  9.  Viabahn-covered stent placement to the right superficial femoral artery x 2 with 5 mm diameter x 15 cm length covered stents for residual stenosis, dissection, and thrombus after angioplasty.  10.  Viabahn-covered stent placement to right external iliac artery with 6 mm diameter Viabahn- covered stent for residual thrombus and stenosis after angioplasty.  11.  StarClose closure device, left femoral artery.   SURGEON: Annice Needy, MD   ANESTHESIA: Local with moderate conscious sedation.   ESTIMATED BLOOD LOSS: 25 mL.   CONTRAST  USED: 90 mL Visipaque.   INDICATION FOR PROCEDURE: This is an individual who was treated recently with bilateral common iliac stents for severe ischemic symptoms. Her left lower extremity symptoms are markedly improved, but she still has rest pain in the right lower extremity. Noninvasive studies following this procedure showed occlusion in the right external iliac artery below the stents. She is brought in for angiography for further evaluation and potential treatment of her right lower extremity ischemic symptoms. Her ischemic rest pain is a critical limb threatening situation with a risk of limb loss without revascularization. Risks and benefits were discussed. Informed consent was obtained.   DESCRIPTION OF PROCEDURE: The patient is brought to the vascular suite. Groins were shaved and prepped and a sterile surgical field was created. The left femoral head was localized with fluoroscopy. The left femoral artery was accessed without difficulty with a Seldinger needle under direct ultrasound guidance and a permanent image was recorded. A 5 French sheath was placed. Pigtail catheter was placed in the aorta at the L1-L2 level and then pulled down to the area of bifurcation where pelvic obliques were performed. This showed a very irregular aorta without stenosis in the aorta. There appeared to be 2 right renal arteries that were patent and a left renal artery which was patent. The left common iliac artery stent was patent. The left external iliac artery had mild disease and does not appear flow-limiting. The right common iliac stent appeared to be patent to the end of the stent. The hypogastric artery drained, but the external iliac artery was occluded. The common femoral artery reconstituted, but the proximal superficial femoral artery was occluded and reconstituted downstream in the SFA, which was small and diseased. She  did appear to have 2 vessel runoff distally.   The patient was systemically heparinized.  A 6 French sheath was placed. A Terumo Advantage wire and RIM catheter were used to cross the aortic bifurcation and advanced through the right external iliac occlusion into the common femoral artery. I then accessed the right superficial femoral artery and crossed this occlusion as well, and confirmed intraluminal flow with a Kumpe catheter in the popliteal artery. There was disease that appeared to be native stenosis in the distal SFA near Hunter's canal that was distinct from the occlusion proximally. It was unclear if the occlusion proximally was chronic atherosclerotic disease or thrombosis from the external iliac occlusion above. TPA 4 mg was administered with the AngioJet catheter in power pulse spray fashion at the right external iliac artery, common femoral artery, and superficial femoral artery to treat the areas of occlusion and thrombosis. This was allowed to dwell for 15 minutes and mechanical rheolytic thrombectomy was performed in the same vessels.   Following this, there was now some flow through the lumen, but a large amount of residual thrombus and stenosis in the external iliac artery. The proximal SFA was still occluded by a large amount of thrombus, about 3-4 cm beyond the origin of the SFA. I treated the SFA, both the distal stenosis and the proximal stenosis and thrombus with occlusion with a 4 mm diameter angioplasty balloon. The iliac artery was then treated with a 6 mm diameter angioplasty balloon. Neither of these resulted in patency and there was still residual stenosis and thrombus. I elected to treat these areas with covered stents due to the thrombus. I exchanged for an 0.018 wire and deployed two 5 mm diameter x 15 cm length covered stents from Hunter's canal up to about 2-3 cm beyond the origin of the SFA, encompassing the large blob of thrombus in the SFA and the stenosis, and dissection distally in the mid to distal SFA, as well as the native stenosis near just above Hunter's  canal. These were post dilated with a 5 mm balloon with an excellent angiographic completion result in the SFA and preserved flow distally. There was some mild stenosis at the origin of the SFA, but there was reasonable flow and I did not want to cover the profunda femoris artery, so I did not stent this area. The external iliac had thrombus and stenosis as well and I used a 6 mm diameter x 10 cm length Viabahn stent up into the common iliac artery stents placed previously and to just above the femoral head. This was post dilated with a 6 mm balloon and I actually used a small 8 mm balloon proximally to make sure this seated to the previously placed stents. This resulted in excellent flow through the iliac arteries without significant residual stenosis and more brisk flow distally. At this point, I elected to terminate the procedure. The sheath was removed. StarClose closure device was deployed in the usual fashion. The patient was awakened from anesthesia and taken to the recovery room in stable condition, having tolerated the procedure well.    ____________________________ Annice NeedyJason S. Lael Wetherbee, MD jsd:LT D: 07/22/2014 16:27:50 ET T: 07/22/2014 20:18:37 ET JOB#: [id number]  cc: Annice NeedyJason S. Jyssica Rief, MD, <Dictator> Annice NeedyJASON S Avrie Kedzierski MD ELECTRONICALLY SIGNED 07/28/2014 10:23

## 2015-02-06 NOTE — Op Note (Signed)
PATIENT NAME:  [name], [name] MR#:  [id number] DATE OF BIRTH:  [birth year]  DATE OF PROCEDURE:  07/01/2014  PREOPERATIVE DIAGNOSES:  1. 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  2. Ischemia left lower extremity.  3. Tobacco dependence.   POSTOPERATIVE DIAGNOSES:  1. 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  2. Ischemia left lower extremity.  3. Tobacco dependence.   PROCEDURES PERFORMED:   1. Abdominal aortogram.  2. Left lower extremity distal runoff.  3. Introduction catheter into aorta right femoral approach.  4. Introduction catheter into the aorta left femoral approach.  5. Percutaneous transluminal angioplasty and stent placement right common iliac artery.  6. Percutaneous transluminal angioplasty and stent placement left common iliac artery.   SURGEON:  Renford Dills, MD.    SEDATION:  Versed 4 mg plus fentanyl 100 mcg administered IV. Continuous ECG, pulse oximetry and cardiopulmonary monitoring was performed throughout the entire procedure by the interventional radiology nurse. Total sedation time was 1 hour.   ACCESS:  1. A 6 French sheath right common femoral artery.  2. A 6 French sheath left common femoral artery.   INDICATIONS:  The patient is a 69 year old woman who presented to the Emergency Room several days ago with worsening pain in her foot. She was subsequently followed up in my office where it was determined that she had an ischemic left leg with significant atherosclerotic occlusive disease. Risks and benefits for angiography and intervention were reviewed. All questions were answered. The patient has agreed to proceed.   DESCRIPTION OF PROCEDURE: The patient is taken to the special procedure suite, placed in the supine position. After adequate sedation has been achieved both groins are prepped and draped in sterile fashion. Ultrasound is placed in  a sterile sleeve. Ultrasound is utilized secondary to lack of appropriate landmarks and to avoid vascular injury. Under direct ultrasound visualization the right common femoral artery is identified. It is echolucent and pulsatile indicating patency. Image is recorded for the permanent record. 1% lidocaine is infiltrated in the soft tissues and under real-time visualization a micropuncture needle is inserted, microwire followed by micro sheath, J-wire followed by a 5 French sheath. A 5 French pigtail catheter and J-wire are then advanced into the abdominal aorta.   AP projection of the aorta is obtained. Subsequently the pigtail catheter is repositioned and bilateral oblique views of the pelvis are obtained. Femorals and proximal SFA are imaged in the AP direction as well.   Total of 4000 units of heparin is given. Ultrasound is utilized in a similar fashion and a sheath is inserted in the left common femoral artery. A 6 [place] wire is negotiated through the catheter on the right, and subsequently a 6 [place] sheath is placed on that side as well. After the heparin has been allowed to circulate a 6 x 6 balloon is advanced up the left and a 6 x 4 up the right. Balloon angioplasty is then performed of the common iliac on the right and left in a kissing balloon fashion.   Followup imaging demonstrates significant dissection on the right with some acute thrombus and inadequate treatment of the lesion on the left. Therefore an 8 x 4 LifeStar stent is advanced up the, on the right an 8 x 4 Absolute Pro and subsequently an 8 x 3 LifeStar are deployed, both are post dilated with the 6 balloon and followup imaging demonstrates resolution of the aforementioned lesions,  although there is still a small amount of non-flow-limiting thrombus noted on the right.   Distal runoff on the left is then obtained by hand injection through the sheath.    Magnified oblique views of both femorals are obtained. She is not a candidate  for closure devices and she is taken to the postoperative area with the sheathes in.  Her ACT will be checked and her sheaths will be pulled and pressure held when appropriate.   INTERPRETATION: The abdominal aorta is opacified with a bolus injection of contrast. She does have one a very large right-sided kidney, this is easily twice the size of normal and a very, very small left kidney. There are no hemodynamically significant stenoses of the renal arteries.  Below the level of the renals there is diffuse extensive disease of the aorta although there does not appear to be a focal hemodynamically significant lesion. The left common iliac artery is occluded, the right demonstrates diffuse disease. The external iliacs are patent bilaterally, but very small as are the common femorals bilaterally with a subtotal occlusion of the left common femoral.   The left superficial femoral artery is occluded from its origin to the [facility]. Hunter canal above-knee popliteal is reconstituted and there is 2 vessel runoff via the posterior tibial and peroneal although the posterior tibial is diffusely diseased. The anterior tibial is occluded throughout its course.   Following angioplasty there is inadequate result and therefore stents are placed as noted above with excellent result, small amount of non-flow-limiting thrombus is noted and the patient will be maintained overnight on Aggrastat.    ____________________________ Renford DillsGregory G. Jaxn Chiquito, MD ggs:bu D: 07/01/2014 17:41:55 ET T: 07/01/2014 18:08:54 ET JOB#: [id number]  cc: Renford DillsGregory G. Carren Blakley, MD, <Dictator> Renford DillsGREGORY G Allesandra Huebsch MD ELECTRONICALLY SIGNED 08/03/2014 20:42

## 2015-02-07 LAB — CBC WITH DIFFERENTIAL/PLATELET
Basophil #: 0 10*3/uL (ref 0.0–0.1)
Basophil %: 0.4 %
Eosinophil #: 0.3 10*3/uL (ref 0.0–0.7)
Eosinophil %: 3.7 %
HCT: 23.9 % — ABNORMAL LOW (ref 35.0–47.0)
HGB: 8 g/dL — ABNORMAL LOW (ref 12.0–16.0)
Lymphocyte #: 0.9 10*3/uL — ABNORMAL LOW (ref 1.0–3.6)
Lymphocyte %: 11.3 %
MCH: 29.5 pg (ref 26.0–34.0)
MCHC: 33.4 g/dL (ref 32.0–36.0)
MCV: 88 fL (ref 80–100)
Monocyte #: 0.6 x10 3/mm (ref 0.2–0.9)
Monocyte %: 7.7 %
Neutrophil #: 6 10*3/uL (ref 1.4–6.5)
Neutrophil %: 76.9 %
Platelet: 157 10*3/uL (ref 150–440)
RBC: 2.7 10*6/uL — ABNORMAL LOW (ref 3.80–5.20)
RDW: 15.4 % — ABNORMAL HIGH (ref 11.5–14.5)
WBC: 7.7 10*3/uL (ref 3.6–11.0)

## 2015-02-08 LAB — CBC WITH DIFFERENTIAL/PLATELET
Basophil #: 0 10*3/uL (ref 0.0–0.1)
Basophil %: 0.4 %
Eosinophil #: 0.3 10*3/uL (ref 0.0–0.7)
Eosinophil %: 4.4 %
HCT: 22.4 % — ABNORMAL LOW (ref 35.0–47.0)
HGB: 7.6 g/dL — ABNORMAL LOW (ref 12.0–16.0)
Lymphocyte #: 1.3 10*3/uL (ref 1.0–3.6)
Lymphocyte %: 17.7 %
MCH: 30.1 pg (ref 26.0–34.0)
MCHC: 34 g/dL (ref 32.0–36.0)
MCV: 88 fL (ref 80–100)
Monocyte #: 0.4 x10 3/mm (ref 0.2–0.9)
Monocyte %: 6.2 %
Neutrophil #: 5.1 10*3/uL (ref 1.4–6.5)
Neutrophil %: 71.3 %
Platelet: 186 10*3/uL (ref 150–440)
RBC: 2.53 10*6/uL — ABNORMAL LOW (ref 3.80–5.20)
RDW: 15 % — ABNORMAL HIGH (ref 11.5–14.5)
WBC: 7.2 10*3/uL (ref 3.6–11.0)

## 2015-02-08 LAB — SURGICAL PATHOLOGY

## 2015-02-09 LAB — SURGICAL PATHOLOGY

## 2015-02-12 ENCOUNTER — Inpatient Hospital Stay
Admit: 2015-02-12 | Discharge: 2015-02-13 | Disposition: A | Discharge status: Home / Self Care | Payer: Medicare Other | Source: Ambulatory Visit | Attending: Internal Medicine | Admitting: Internal Medicine

## 2015-02-12 DIAGNOSIS — E871 Hypo-osmolality and hyponatremia: Secondary | ICD-10-CM | POA: Diagnosis not present

## 2015-02-12 DIAGNOSIS — Z89611 Acquired absence of right leg above knee: Secondary | ICD-10-CM | POA: Diagnosis not present

## 2015-02-12 DIAGNOSIS — K219 Gastro-esophageal reflux disease without esophagitis: Secondary | ICD-10-CM | POA: Diagnosis not present

## 2015-02-12 DIAGNOSIS — Z7901 Long term (current) use of anticoagulants: Secondary | ICD-10-CM | POA: Diagnosis not present

## 2015-02-12 DIAGNOSIS — E876 Hypokalemia: Secondary | ICD-10-CM | POA: Diagnosis not present

## 2015-02-12 DIAGNOSIS — A419 Sepsis, unspecified organism: Secondary | ICD-10-CM | POA: Diagnosis not present

## 2015-02-12 DIAGNOSIS — Z87891 Personal history of nicotine dependence: Secondary | ICD-10-CM | POA: Diagnosis not present

## 2015-02-12 DIAGNOSIS — Z48812 Encounter for surgical aftercare following surgery on the circulatory system: Secondary | ICD-10-CM | POA: Diagnosis not present

## 2015-02-12 DIAGNOSIS — J189 Pneumonia, unspecified organism: Secondary | ICD-10-CM | POA: Diagnosis not present

## 2015-02-12 DIAGNOSIS — Z5181 Encounter for therapeutic drug level monitoring: Secondary | ICD-10-CM | POA: Diagnosis not present

## 2015-02-12 DIAGNOSIS — Z79891 Long term (current) use of opiate analgesic: Secondary | ICD-10-CM | POA: Diagnosis not present

## 2015-02-12 DIAGNOSIS — I739 Peripheral vascular disease, unspecified: Secondary | ICD-10-CM | POA: Diagnosis not present

## 2015-02-12 DIAGNOSIS — N189 Chronic kidney disease, unspecified: Secondary | ICD-10-CM | POA: Diagnosis not present

## 2015-02-12 DIAGNOSIS — N390 Urinary tract infection, site not specified: Secondary | ICD-10-CM | POA: Diagnosis not present

## 2015-02-12 DIAGNOSIS — D649 Anemia, unspecified: Secondary | ICD-10-CM | POA: Diagnosis not present

## 2015-02-12 DIAGNOSIS — I129 Hypertensive chronic kidney disease with stage 1 through stage 4 chronic kidney disease, or unspecified chronic kidney disease: Secondary | ICD-10-CM | POA: Diagnosis not present

## 2015-02-12 DIAGNOSIS — R509 Fever, unspecified: Secondary | ICD-10-CM | POA: Diagnosis not present

## 2015-02-12 DIAGNOSIS — Z7982 Long term (current) use of aspirin: Secondary | ICD-10-CM | POA: Diagnosis not present

## 2015-02-12 DIAGNOSIS — I2510 Atherosclerotic heart disease of native coronary artery without angina pectoris: Secondary | ICD-10-CM | POA: Diagnosis not present

## 2015-02-12 DIAGNOSIS — Z8249 Family history of ischemic heart disease and other diseases of the circulatory system: Secondary | ICD-10-CM | POA: Diagnosis not present

## 2015-02-12 LAB — COMPREHENSIVE METABOLIC PANEL
Albumin: 2.4 g/dL — ABNORMAL LOW
Alkaline Phosphatase: 141 U/L — ABNORMAL HIGH
Anion Gap: 9 (ref 7–16)
BUN: 15 mg/dL
Bilirubin,Total: 0.6 mg/dL
Calcium, Total: 7.9 mg/dL — ABNORMAL LOW
Chloride: 94 mmol/L — ABNORMAL LOW
Co2: 24 mmol/L
Creatinine: 0.95 mg/dL
EGFR (African American): >60
EGFR (Non-African Amer.): >60
Glucose: 110 mg/dL — ABNORMAL HIGH
Potassium: 3.3 mmol/L — ABNORMAL LOW
SGOT(AST): 71 U/L — ABNORMAL HIGH
SGPT (ALT): 30 U/L
Sodium: 127 mmol/L — ABNORMAL LOW
Total Protein: 6.2 g/dL — ABNORMAL LOW

## 2015-02-12 LAB — URINALYSIS, COMPLETE
Bilirubin,UR: NEGATIVE
Glucose,UR: NEGATIVE mg/dL (ref 0–75)
Ketone: NEGATIVE
Nitrite: POSITIVE
Ph: 7 (ref 4.5–8.0)
Protein: 100
Specific Gravity: 1.015 (ref 1.003–1.030)
Transitional Epi: 1

## 2015-02-12 LAB — CBC WITH DIFFERENTIAL/PLATELET
Basophil #: 0 10*3/uL (ref 0.0–0.1)
Basophil %: 0.1 %
Eosinophil #: 0 10*3/uL (ref 0.0–0.7)
Eosinophil %: 0 %
HCT: 25.9 % — ABNORMAL LOW (ref 35.0–47.0)
HGB: 8.6 g/dL — ABNORMAL LOW (ref 12.0–16.0)
Lymphocyte #: 0.6 10*3/uL — ABNORMAL LOW (ref 1.0–3.6)
Lymphocyte %: 5.5 %
MCH: 29.4 pg (ref 26.0–34.0)
MCHC: 33.4 g/dL (ref 32.0–36.0)
MCV: 88 fL (ref 80–100)
Monocyte #: 0.8 x10 3/mm (ref 0.2–0.9)
Monocyte %: 7.9 %
Neutrophil #: 9 10*3/uL — ABNORMAL HIGH (ref 1.4–6.5)
Neutrophil %: 86.5 %
Platelet: 343 10*3/uL (ref 150–440)
RBC: 2.94 10*6/uL — ABNORMAL LOW (ref 3.80–5.20)
RDW: 15.3 % — ABNORMAL HIGH (ref 11.5–14.5)
WBC: 10.4 10*3/uL (ref 3.6–11.0)

## 2015-02-12 LAB — MAGNESIUM: Magnesium: 2 mg/dL

## 2015-02-12 LAB — LACTIC ACID, PLASMA: Lactic Acid, Venous: 1.8 mmol/L

## 2015-02-12 LAB — PROTIME-INR
INR: 1.2
Prothrombin Time: 15 secs

## 2015-02-12 LAB — TROPONIN I: Troponin-I: 0.13 ng/mL — ABNORMAL HIGH

## 2015-02-12 LAB — PHOSPHORUS: Phosphorus: 1.6 mg/dL — ABNORMAL LOW

## 2015-02-13 DIAGNOSIS — J189 Pneumonia, unspecified organism: Secondary | ICD-10-CM | POA: Diagnosis present

## 2015-02-13 LAB — BASIC METABOLIC PANEL
Anion Gap: 5 — ABNORMAL LOW (ref 7–16)
BUN: 10 mg/dL
Calcium, Total: 7.3 mg/dL — ABNORMAL LOW
Chloride: 103 mmol/L
Co2: 23 mmol/L
Creatinine: 0.83 mg/dL
EGFR (African American): >60
EGFR (Non-African Amer.): >60
Glucose: 104 mg/dL — ABNORMAL HIGH
Potassium: 3 mmol/L — ABNORMAL LOW
Sodium: 131 mmol/L — ABNORMAL LOW

## 2015-02-13 LAB — PHOSPHORUS: Phosphorus: 1.6 mg/dL — ABNORMAL LOW

## 2015-02-13 MED ORDER — ACETAMINOPHEN 325 MG PO TABS: 650.0000 mg | ORAL_TABLET | ORAL | Status: DC | PRN

## 2015-02-13 MED ORDER — POTASSIUM & SODIUM PHOSPHATES 280-160-250 MG PO PACK: 1.0000 | PACK | Freq: Three times a day (TID) | ORAL | Status: DC

## 2015-02-13 MED ORDER — K PHOS MONO-SOD PHOS DI & MONO 155-852-130 MG PO TABS
250.0000 mg | ORAL_TABLET | Freq: Two times a day (BID) | ORAL | Status: DC
  Filled 2015-02-13 (×2): qty 1

## 2015-02-13 MED ORDER — FAMOTIDINE 20 MG PO TABS: 20.0000 mg | ORAL_TABLET | Freq: Every day | ORAL | Status: DC

## 2015-02-13 MED ORDER — OXYCODONE-ACETAMINOPHEN 5-325 MG PO TABS: 1.0000 | ORAL_TABLET | ORAL | Status: DC | PRN

## 2015-02-13 MED ORDER — LEVOFLOXACIN 250 MG PO TABS: 250.0000 mg | ORAL_TABLET | Freq: Every day | ORAL | Status: DC

## 2015-02-13 MED ORDER — SODIUM CHLORIDE 0.9 % IJ SOLN: 3.0000 mL | INTRAMUSCULAR | Status: DC | PRN

## 2015-02-13 MED ORDER — DOCUSATE SODIUM 100 MG PO CAPS: 100.0000 mg | ORAL_CAPSULE | Freq: Two times a day (BID) | ORAL | Status: DC | PRN

## 2015-02-13 MED ORDER — APIXABAN 5 MG PO TABS: 5.0000 mg | ORAL_TABLET | Freq: Every day | ORAL | Status: DC

## 2015-02-13 MED ORDER — SENNA 8.6 MG PO TABS: 1.0000 | ORAL_TABLET | Freq: Two times a day (BID) | ORAL | Status: DC | PRN

## 2015-02-13 MED ORDER — ONDANSETRON HCL 4 MG/2ML IJ SOLN: 4.0000 mg | INTRAMUSCULAR | Status: DC | PRN

## 2015-02-13 MED ORDER — ASPIRIN 81 MG PO CHEW: 81.0000 mg | CHEWABLE_TABLET | Freq: Every day | ORAL | Status: DC

## 2015-02-14 DIAGNOSIS — I739 Peripheral vascular disease, unspecified: Secondary | ICD-10-CM | POA: Diagnosis not present

## 2015-02-14 DIAGNOSIS — N189 Chronic kidney disease, unspecified: Secondary | ICD-10-CM | POA: Diagnosis not present

## 2015-02-14 DIAGNOSIS — D649 Anemia, unspecified: Secondary | ICD-10-CM | POA: Diagnosis not present

## 2015-02-14 DIAGNOSIS — Z5181 Encounter for therapeutic drug level monitoring: Secondary | ICD-10-CM | POA: Diagnosis not present

## 2015-02-14 DIAGNOSIS — I129 Hypertensive chronic kidney disease with stage 1 through stage 4 chronic kidney disease, or unspecified chronic kidney disease: Secondary | ICD-10-CM | POA: Diagnosis not present

## 2015-02-14 DIAGNOSIS — Z89611 Acquired absence of right leg above knee: Secondary | ICD-10-CM | POA: Diagnosis not present

## 2015-02-14 DIAGNOSIS — Z48812 Encounter for surgical aftercare following surgery on the circulatory system: Secondary | ICD-10-CM | POA: Diagnosis not present

## 2015-02-14 DIAGNOSIS — Z7901 Long term (current) use of anticoagulants: Secondary | ICD-10-CM | POA: Diagnosis not present

## 2015-02-14 DIAGNOSIS — K219 Gastro-esophageal reflux disease without esophagitis: Secondary | ICD-10-CM | POA: Diagnosis not present

## 2015-02-14 LAB — CULTURE, BLOOD (SINGLE)

## 2015-02-14 NOTE — Op Note (Signed)
PATIENT NAME:  [name], [name] MR#:  [id number] DATE OF BIRTH:  [birth year]  DATE OF PROCEDURE:  01/07/2015  PREOPERATIVE DIAGNOSES:  1.  Acute on chronic ischemia of right lower extremity with ischemic rest pain.  2.  Status post multiple previous interventions for peripheral arterial disease bilaterally.  3.  Chronic kidney disease.   POSTOPERATIVE DIAGNOSES:  1.  Acute on chronic ischemia of right lower extremity with ischemic rest pain.  2.  Status post multiple previous interventions for peripheral arterial disease bilaterally.  3.  Chronic kidney disease.   PROCEDURES:  1.  Ultrasound guidance for vascular access to left femoral artery.  2.  Catheter placement to right peroneal artery and right posterior tibial artery from left femoral approach.  3.  Aortogram and selective right lower extremity angiogram.  4.  Catheter directed thrombolysis with 8 mg of tPA using the AngioJet Proxi catheter in the right superficial femoral artery, popliteal artery, tibioperoneal trunk, and proximal peroneal artery.  5.  Mechanical rheolytic thrombectomy to same vessels with AngioJet Proxi catheter.  6.  Percutaneous transluminal angioplasty of right common femoral artery and proximal superficial femoral artery with 5 mm diameter Lutonix drug-coated angioplasty balloon.  7.  Percutaneous transluminal angioplasty of tibioperoneal trunk and proximal peroneal artery with 3 mm diameter angioplasty balloon.  8.  Percutaneous transluminal angioplasty of entire posterior tibial artery into the mid foot with 2.5 mm diameter angioplasty balloon back up into the tibioperoneal trunk.   SURGEON: Annice Needy, MD.   ANESTHESIA: Local with moderate conscious sedation.   ESTIMATED BLOOD LOSS: 25 mL.   FLUOROSCOPY TIME: 7.3 minutes.   CONTRAST USED: 68 mL.   INDICATION FOR PROCEDURE: This is a 69 year old female with extensive peripheral vascular disease. She presented this morning with acute on chronic  right lower extremity ischemia. She had numbness and ischemic rest pain. We are trying to perform angiogram for delineation of anatomy, potential revascularization for limb salvage, or determination of level of amputation. It was discussed with her that she has now had multiple interventions in the past year and long term patency is obviously poor, she has had a very hard time with tobacco dependence, she has very small and diseased vessels. We are attempting to salvage her leg if at all possible. Risks and benefits were discussed. Informed consent was obtained.   DESCRIPTION OF PROCEDURE: The patient was brought to the vascular suite. Groins were shaved and prepped and a sterile surgical field was created.  The diseased left femoral artery was seen under ultrasound and accessed under direct ultrasound guidance. Micropuncture wire and catheter were placed and a permanent image was recorded. I upsized to a 5 [place] sheath and a pigtail catheter was placed in the aorta at the L1 level. AP aortogram was performed. This showed slightly aneurysmal, but not stenotic aorta, the iliac stents on both sides were actually patent without significant stenosis. This was better than the last time she presented with occlusion that involved both iliac vessels. I crossed the aortic bifurcation with the pigtail catheter and Advantage wire and advanced to the right femoral head. Selective right lower extremity angiogram was then performed. This showed occlusion of the SFA proximally only about 2-3 cm beyond its origin. The common femoral artery was somewhat diffusely diseased. There essentially was no runoff distally on the initial images. The patient was then heparinized.  A 6 French Ansell sheath was placed over a [employer] wire. I was able to gain access into the occluded  SFA and passed the Advantage wire and a Kumpe catheter across the occlusion and confirm intraluminal flow in the peroneal artery. She has a Vyvanse stent  down into the tibioperoneal trunk from a previous intervention. I then replaced the 0.035 wire due to her small size with a 90 cm Proxi catheter, was able to treat from the origin of the SFA throughout the popliteal artery into the tibioperoneal trunk just below the bottom of the stent to the proximal peroneal artery. 8 mg of tPA was instilled in a power pulse spray fashion and this allowed to dwell for 15 minutes. After infusion of thrombolysis I then performed mechanical rheolytic thrombectomy with the AngioJet Proxi catheter to the entire right SFA, popliteal artery, tibioperoneal trunk, and proximal peroneal artery. At this point this uncovered some residual stenosis and thrombus at the origin of the SFA, I treated this with a 5 mm diameter Lutonix drug-coated angioplasty balloon with a good result, however there was still almost no flow through the stent due to poor runoff distally. Kumpe catheter was parked in the stent.  The remainder of the SFA, the popliteal artery itself was patent, however there was occlusion at the distal edge of the stent in the tibioperoneal trunk and proximal peroneal artery. I took a 3 mm diameter angioplasty balloon into about the first 5-6 cm of the peroneal artery and into the tibioperoneal trunk. This resulted in restored patency. The peroneal artery below this area had significant spasm. The posterior tibial artery had multiple areas of stenosis and occlusion throughout its course, however it did reconstitute and was patent in the foot. I then turned my attention to cannulating this.  I cannulated this separately with a Kumpe catheter and an 0.018 wire and was able to advance a wire all the way into the foot of the posterior tibial artery, crossing the multiple stenoses and occlusion of the posterior tibial artery. I then treated with a 2.5 mm diameter angioplasty balloon from the mid foot up through the entirety of the posterior tibial artery and into the tibioperoneal trunk  and the previous stent. Following this with the Kumpe catheter in the distal popliteal artery, her runoff was markedly improved, she still had areas of disease and likely some residual thrombus within her peroneal and posterior tibial arteries, but this was much better and at this point no further percutaneous revascularization would be of benefit. We will place her on Aggrastat and then oral anticoagulants. The sheath was pulled back to the ipsilateral external iliac artery, oblique arteriogram was performed.  Her vessel was small and diseased in the common femoral artery and not appropriate for StarClose closure device and manual pressure was held for 30 minutes and a sterile pressure dressing was placed. The patient was taken to the recovery room in stable condition, having tolerated the procedure well.    ____________________________ Annice NeedyJason S. Dew, MD jsd:bu D: 01/07/2015 12:51:08 ET T: 01/07/2015 21:18:02 ET JOB#: [id number]  cc: Annice NeedyJason S. Dew, MD, <Dictator> Mary SellaEric M. Mayford Knifeurner, PA-C Annice NeedyJASON S DEW MD ELECTRONICALLY SIGNED 01/18/2015 15:49

## 2015-02-14 NOTE — Op Note (Signed)
PATIENT NAME:  [name], Gypsy S MR#:  [id number] DATE OF BIRTH:  July 19, 1946  DATE OF PROCEDURE:  02/05/2015  PREOPERATIVE DIAGNOSES: 1. Ischemia of right foot.  2. Thrombosis femoral distal bypass.   POSTOPERATIVE DIAGNOSES: 1. Ischemia of right foot. 2. Thrombosis femoral distal bypass.   PROCEDURE PERFORMED: Right above knee amputation.   SURGEON: Renford DillsGregory G. Schnier, MD   ANESTHESIA: General by LMA.   FLUIDS: Per anesthesia record.   ESTIMATED BLOOD LOSS: 200 mL.   SPECIMEN: Distal right limb to pathology, permanent section.   INDICATIONS: [name] is a 69 year old woman who presented to the hospital with an ischemic right foot. She has undergone revascularization; however, this has not provided adequate perfusion. All options for limb salvage have been exhausted and the patient is recommended to have above knee amputation. Risks and benefits are reviewed. All questions answered. The patient has agreed to proceed.   PROCEDURE IN DETAIL:  The patient is taken to the operating room,  placed in the supine position.  After adequate general anesthesia induced,  appropriate invasive monitors are placed.  She is positioned supine and her right leg is prepped and draped in a sterile fashion.   A circumferential incision is then diagrammed onto the distal thigh with a surgical marker and skin incision is made after appropriate timeout.   Fascia is then incised and the anterior muscle bellies are transected with Bovie cautery. The superficial femoral artery vein and the femoral nerve are identified and ligated and divided. The bypass is also encountered and this is ligated and divided as well. The periosteum is then elevated on the femoral shaft and the femur is transected with the Gigli saw. Posterior muscle bellies are transected with the amputation knife. Bleeding is controlled with hemostats. Subsequently 0 Vicryl is used to ligate the individual sites as well as the sciatic nerve which  is  retracted, ligated, and then allowed to retract back up into the muscle bellies.   The wound is then irrigated with a liter of saline after rasp was used to smooth the femur and  subsequently the fascia was closed with 0 Vicryl. Subcutaneous tissues are closed with 3-0 Vicryl and the skin is closed with staples. Sterile dressing is applied. The patient tolerated the procedure well and there were no immediate complications. Sponge and needle counts are correct and she is taken to the recovery area in excellent condition.     ____________________________ Renford DillsGregory G. Schnier, MD ggs:tr D: 02/05/2015 16:58:38 ET T: 02/05/2015 17:54:09 ET JOB#: [id number]  cc: Renford DillsGregory G. Schnier, MD, <Dictator> Renford DillsGREGORY G SCHNIER MD ELECTRONICALLY SIGNED 02/09/2015 12:49

## 2015-02-14 NOTE — Op Note (Signed)
PATIENT NAME:  [name], [name] MR#:  [id number] DATE OF BIRTH:  Aug 22, 1946  DATE OF PROCEDURE:  01/08/2015  PREOPERATIVE DIAGNOSES:  1. Atherosclerotic occlusive disease, bilateral lower extremities.  2. New onset rest pain, left lower extremity.   POSTOPERATIVE DIAGNOSES:  1. Atherosclerotic occlusive disease, bilateral lower extremities.  2. New onset rest pain, left lower extremity. 3. Thrombus associated with apparent  dissection, left common femoral artery with new thrombosis of the left superficial femoral artery.   PROCEDURES PERFORMED:  1. Left lower extremity distal runoff, 3rd order catheter placement with  catheter positioned in the profunda femoris  2. Additional 3rd order catheter placement with the catheter positioned in the SFA.   PROCEDURE PERFORMED BY: Renford Dills, M.D.   SEDATION:  Versed plus fentanyl.   CONTRAST USED: Isovue 60 mL.   FLUOROSCOPY TIME: 12.5 minutes.   ACCESS: A 6 French sheath, right common femoral artery.   INDICATIONS: [name] is a 69 year old woman who presented to the Emergency Room with profound ischemia of the right lower extremity. Yesterday she underwent angiography with intervention for limb salvage, which was successful on the right. She was initiated on an Aggrastat drip. Postoperatively, she was doing well. Postoperative check yesterday evening demonstrated the right foot to be hyperemic, left foot was at baseline, and the patient denied any pain. Earlier this morning, I received a call that her left foot had become cold and that she was in increasing pain and, therefore, she is brought to the angio suite for evaluation and hope for salvage. Risks and benefits have been reviewed with the patient. All questions have been answered. The patient has agreed to proceed.   DESCRIPTION OF PROCEDURE: The patient is taken to special procedures and placed in  supine position. After adequate sedation is achieved, her right groin is prepped  and draped in sterile fashion. Ultrasound is placed in a sterile sleeve. Common femoral artery is identified. It is echolucent and pulsatile indicating patency. Image is recorded for the permanent record in real-time visualization, Microneedle is inserted, microwire followed by microsheath, J-wire followed by a 5 [place] sheath. Glidewire and rim catheter are then used to cross the bifurcation.  Hand injection of contrast demonstrates patency of the iliac system. The catheter is then advanced down to the common femoral, where hand injection demonstrates what appears to be a combination of dissection,  very complex, and thrombus. The catheter is then negotiated into the profunda femoris where hand injection of contrast demonstrates patency of the profunda femoris distally. Distal runoff is then obtained at this point, which does show reconstitution of the above-knee popliteal but it appears to be diseased. There is intermittent islands of the tibial arteries, the best of which appears to be the peroneal; however, all of these are incomplete.   The trifurcation itself appears to have multiple short segment occlusions throughout it. In order to attempt across  and perhaps obtain better images, the catheter is then repositioned.  It is advanced into the SFA; however, imaging does not show any improvement in the distal runoff and, in fact, appears to have had dissection extending into the SFA. Given these findings, it is elected to proceed with open surgery as intervention and stenting in the common femoral is certainly not indicated.  The sheath is pulled into the right side and a Mynx device deployed without difficulty. There are no immediate complications.   INTERPRETATION: Images demonstrate that there is now a combination of thrombus and dissection in the common  femoral that extends into the external iliac as well as into the superficial femoral artery and profunda femoris. The patient, as noted above, will  undergo open revision.    ____________________________ Renford DillsGregory G. Schnier, MD ggs:tr D: 01/08/2015 12:21:00 ET T: 01/08/2015 16:56:13 ET JOB#: [id number]  cc: Renford DillsGregory G. Schnier, MD, <Dictator> Renford DillsGREGORY G SCHNIER MD ELECTRONICALLY SIGNED 01/26/2015 15:10

## 2015-02-14 NOTE — Consult Note (Signed)
Brief Consult Note: Diagnosis: PVD- consult for medical issues.   Patient was seen by consultant.   Consult note dictated.   Orders entered.   Comments: will follow for medical issues.  Electronic Signatures: Altamese DillingVachhani, Jacobs Golab (MD)  (Signed 25-Mar-16 16:49)  Authored: Brief Consult Note   Last Updated: 25-Mar-16 16:49 by Altamese DillingVachhani, Wolfe Camarena (MD)

## 2015-02-14 NOTE — Consult Note (Signed)
CHIEF COMPLAINT and HISTORY:  Subjective/Chief Complaint right foot pain/numbness   History of Present Illness Patient with an extensive history of vascular disease, s/p multiple interventions to BLE for limb threatening situations returns with <12 hour history of RLE pain and numbness.  She fell last night and has a head lac that is closed and dose not have significant underlying hematoma or injury.  Right leg is cool but not cold.  left toes look chronically ischemic.  Femoral pulse 1+ on right, nothing distally.  Desires an attempt at limb salvage if at all possible.  Has remained on oral blood thinners.   PAST MEDICAL/SURGICAL HISTORY:  Past Medical History:   disfunctional left kidney:    kidney stone:    acid reflux:    Stent in legs:    tubal pregnancy:   ALLERGIES:  Allergies:  No Known Allergies:   HOME MEDICATIONS:  Home Medications: Medication Instructions Status  acetaminophen-oxyCODONE 325 mg-5 mg oral tablet 1 tab(s) orally every 4 hours, As needed, moderate pain (4-6/10) Active  clopidogrel 75 mg oral tablet 1 tab(s) orally once a day Active  hydrochlorothiazide-lisinopril 12.5 mg-20 mg oral tablet 1 tab(s) orally once a day Active  Chantix 0.5 mg oral tablet 1 tab(s) orally 2 times a day Active   Family and Social History:  Family History Coronary Artery Disease  Hypertension  Smoking   Social History positive  tobacco (Current within 1 year), negative ETOH, negative Illicit drugs   Place of Living Home   Review of Systems:  Subjective/Chief Complaint LE symptoms as above. No TIA/stroke/seizure No heat or cold intolerance No dysuria/hematuria No blurry or double vision No tinnitus or ear pain No rashes or ulcer.  Laceration on scalp closed No suicidal ideation or psychosis No signs of bleeding or easy bruising No SOB/DOE, orthopnea, or sputum No palpitations or chest pain No N/V/D or abdominal pain No joint pain or joint swelling No fever or  chills No unintentional weight loss or gain   Physical Exam:  GEN well developed, well nourished, thin   HEENT pink conjunctivae, hearing intact to voice   NECK No masses  trachea midline   RESP normal resp effort  no use of accessory muscles   CARD regular rate  no murmur  no JVD   VASCULAR ACCESS none   ABD denies tenderness  soft  normal BS   GU no superpubic tenderness   LYMPH negative neck, negative axillae   EXTR no pedal pulses on right, only doppler signals on left.  Slow cap refill bilaterally.  Motor and sensory intact   SKIN normal to palpation, skin turgor good   NEURO cranial nerves intact, follows commands, motor/sensory function intact   PSYCH alert, A+O to time, place, person, good insight   LABS:  Laboratory Results: Routine Chem:    24-Mar-16 54:09, Basic Metabolic Panel (w/Total Calcium)  Glucose, Serum 116  65-99  NOTE: New Reference Range   12/22/14  BUN 25  6-20  NOTE: New Reference Range   12/22/14  Creatinine (comp) 1.25  0.44-1.00  NOTE: New Reference Range   12/22/14  Sodium, Serum 132  135-145  NOTE: New Reference Range   12/22/14  Potassium, Serum 3.5  3.5-5.1  NOTE: New Reference Range   12/22/14  Chloride, Serum 103  101-111  NOTE: New Reference Range   12/22/14  CO2, Serum 20  22-32  NOTE: New Reference Range   12/22/14  Calcium (Total), Serum 9.1  8.9-10.3  NOTE: New Reference  Range   12/22/14  Anion Gap 9  eGFR (African American) 51  eGFR (Non-African American) 44  eGFR values <43m/min/1.73 m2 may be an indication of chronic  kidney disease (CKD).  Calculated eGFR is useful in patients with stable renal function.  The eGFR calculation will not be reliable in acutely ill patients  when serum creatinine is changing rapidly. It is not useful in  patients on dialysis. The eGFR calculation may not be applicable  to patients at the low and high extremes of body sizes, pregnant  women, and vegetarians.  Routine  Coag:    24-Mar-16 08:13, Activated PTT  Activated PTT (APTT) 30.2  A HCT value >55% may artifactually increase the APTT. In one study,  the increase was an average of 19%.  Reference: "Effect on Routine and Special Coagulation Testing Values  of Citrate Anticoagulant Adjustment in Patients with High HCT Values."  American Journal of Clinical Pathology 2006;126:400-405.    24-Mar-16 08:13, Prothrombin Time  Prothrombin 13.3  11.4-15.0  NOTE: New Reference Range   11/13/14  INR 1.0  INR reference interval applies to patients on anticoagulant therapy.  A single INR therapeutic range for coumarins is not optimal for all  indications; however, the suggested range for most indications is  2.0 - 3.0.  Exceptions to the INR Reference Range may include: Prosthetic heart  valves, acute myocardial infarction, prevention of myocardial  infarction, and combinations of aspirin and anticoagulant. The need  for a higher or lower target INR must be assessed individually.  Reference: The Pharmacology and Management of the Vitamin K   antagonists: the seventh ACCP Conference on Antithrombotic and  Thrombolytic Therapy. CYOVZC.5885Sept:126 (3suppl): [id number]  A HCT value >55% may artifactually increase the PT.  In one study,   the increase was an average of 25%.  Reference:  "Effect on Routine and Special Coagulation Testing Values  of Citrate Anticoagulant Adjustment in Patients with High HCT Values."  American Journal of Clinical Pathology 2006;126:400-405.  Routine Hem:    24-Mar-16 08:13, Hemogram, Platelet Count  WBC (CBC) 7.9  RBC (CBC) 3.61  Hemoglobin (CBC) 10.3  Hematocrit (CBC) 32.1  Platelet Count (CBC) 265  Result(s) reported on 07 Jan 2015 at 08:28AM.  MCV 89  MCH 28.5  MCHC 32.0  RDW 15.3   RADIOLOGY:  Radiology Results: XRay:    12-Sep-15 20:38, Femur Left  Femur Left  REASON FOR EXAM:    left leg pain, concern for PAD, please eval for   vessel calcification  COMMENTS:        PROCEDURE: DXR - DXR FEMUR LEFT  - Jun 27 2014  8:38PM     CLINICAL DATA:  LEFT leg pain.  Peripheral arterial disease.    EXAM:  LEFT FEMUR -2 VIEW    COMPARISON:  None.    FINDINGS:  LEFT femur is intact. Atherosclerotic calcification is present in  the superficial femoral artery and below the knee.     IMPRESSION:  Intact LEFT femur.      Electronically Signed    By: GDereck LigasM.D.    On: 06/27/2014 20:51         Verified By: GMelvyn Novas M.D.,  LBeatrice    20-Apr-15 16:09, Screening Digital Mammogram  PACS Image    12-Sep-15 20:38, Femur Left  PACS Image    24-Mar-16 08:27, CT Head Without Contrast  PACS Image  CT:  CT Head Without Contrast  REASON FOR EXAM:    fall, R  lat orbit lac. no crepitus  COMMENTS:       PROCEDURE: CT  - CT HEAD WITHOUT CONTRAST  - Jan 07 2015  8:27AM     CLINICAL DATA:  Fall last night, hit right face and head on cabinet    EXAM:  CT HEAD WITHOUT CONTRAST    TECHNIQUE:  Contiguous axial images were obtained from the base of the skull  through the vertex without intravenous contrast.    COMPARISON:  None.  FINDINGS:  No skull fracture is noted. Paranasal sinuses and mastoid air cells  are unremarkable. No intracranial hemorrhage, mass effect or midline  shift. Moderate cerebral atrophy. No acute cortical infarction.  There is small lacunar infarct in right occipital lobe.    No mass lesion is noted on this unenhanced scan.     IMPRESSION:  No acute intracranial abnormality. Moderate cerebral atrophy. 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 No acute cortical  infarction. Mild periventricular white matter decreased attenuation  probable due to chronic small vessel ischemic changes.    Electronically Signed    By: Lahoma Crocker M.D.    On: 01/07/2015 08:32         Verified By: Ephraim Hamburger, M.D.,  [facility]:    20-Apr-15 16:09, Screening Digital Mammogram  Screening Digital Mammogram   REASON FOR EXAM:    scr mammo no order  COMMENTS:       PROCEDURE: MAM - MAM DGTL SCRN MAM NO ORDER W/CAD  - Feb 02 2014  4:09PM     CLINICAL DATA:  Screening.    EXAM:  DIGITAL SCREENING BILATERAL MAMMOGRAM WITH CAD    COMPARISON:  Previous exam(s).    ACR Breast Density Category c: The breast tissue is heterogeneously  dense, which may obscure small masses.  FINDINGS:  There are no findings suspicious for malignancy. Images were  processed with CAD.     IMPRESSION:  No mammographic evidence of malignancy. A result letter of this  screening mammogram will be mailed directly to the patient.    RECOMMENDATION:  Screening mammogram in one year. (Code:SM-B-01Y)    BI-RADS CATEGORY  1: Negative.      Electronically Signed    By: Abelardo Diesel M.D.    On: 02/02/2014 16:30         Verified By: Abelardo Diesel, M.D.,   ASSESSMENT AND PLAN:  Assessment/Admission Diagnosis acute on chronic limb threatening ischemia of RLE. Chronic ischemia LLE   Plan discussed very high risk of limb loss. Will attempt angiogram with possible revascularization.  Likely will need thrombolysis.  Even if successful, patency would likely not be great as her runoff is compromised.  To angio emergently.    level 3 admit   Electronic Signatures: Algernon Huxley (MD)  (Signed 24-Mar-16 11:21)  Authored: Chief Complaint and History, PAST MEDICAL/SURGICAL HISTORY, ALLERGIES, HOME MEDICATIONS, Family and Social History, Review of Systems, Physical Exam, LABS, RADIOLOGY, Assessment and Plan   Last Updated: 24-Mar-16 11:21 by Algernon Huxley (MD)

## 2015-02-14 NOTE — H&P (Signed)
Subjective/Chief Complaint right foot pain   History of Present Illness The patient is a 69 year old female, extensive history of vascular disease s/p multiple interventions in both lower extremities. She came to the ER early this am with the abrupt onset ov right lower extremity pain and numbness. Last month she had angiography  with runoff right lower extremity and intervention which provided a viable foot.  Unfortunately the course was complicated with left femoral thrombosis which was treated with surgery.  Today she is noting her right foot is cold an painful.  Recent INR was 2.4 but on admission her INR is 1.3.  No trauma, no recent illness.   Past History PAST MEDICAL HISTORY:   1. Dysfunctional left kidney.  2. Kidney stone.  3. Acid reflux.  4. Severe peripheral vascular disease and chronic ischemia, multiple stent in the legs.  5. Tubal pregnancy.   Past Med/Surgical Hx:  disfunctional left kidney:   kidney stone:   acid reflux:   Stent in legs:   tubal pregnancy:   ALLERGIES:  No Known Allergies:   HOME MEDICATIONS: Medication Instructions Status  acetaminophen-oxyCODONE 325 mg-5 mg oral tablet 1 tab(s) orally every 4 hours, As needed, moderate pain (4-6/10) Active  hydrochlorothiazide-lisinopril 12.5 mg-20 mg oral tablet 1 tab(s) orally once a day Active  aspirin 81 mg oral tablet 1 tab(s) orally once a day Active  Pepcid 20 mg oral tablet 1 tab(s) orally once a day Active  Coumadin 2.5 mg oral tablet 1 tab(s) orally once a day Active   Family and Social History:  Family History No porphyria no autoimmune disease   Social History positive  tobacco, negative ETOH, negative Illicit drugs   + Tobacco Current (within 1 year)   Place of Living Home   Review of Systems:  ROS No TIA/stroke/seizure No heat or cold intolerance No dysuria/hematuria No blurry or double vision No tinnitus or ear pain No rashes or ulcer No suicidal ideation or psychosis No signs of  bleeding or easy bruising No SOB/DOE, orthopnea, or sputum No palpitations or chest pain No N/V/D or abdominal pain No joint pain or joint swelling No fever or chills No unintentional weight loss or gain   Physical Exam:  GEN well developed, no acute distress   HEENT hearing intact to voice, moist oral mucosa, Oropharynx clear   NECK supple  trachea midline   RESP normal resp effort  no use of accessory muscles   CARD regular rate  No LE edema  no JVD   ABD denies tenderness  normal BS  nondistended   EXTR negative edema, right foot pale and cold, left foot pink and warm, pedal pulses nonpalpable bilaterally, femoral pulses 2+ bilaterally   SKIN No rashes, No ulcers   NEURO cranial nerves intact, follows commands, motor/sensory function intact   PSYCH alert, A+O to time, place, person, good insight   Lab Results: Hepatic:  20-Apr-16 02:54   Bilirubin, Total 0.3 (0.3-1.2 NOTE: New Reference Range  12/22/14)  Alkaline Phosphatase 109 (38-126 NOTE: New Reference Range  12/22/14)  SGPT (ALT)  10 (14-54 NOTE: New Reference Range  12/22/14)  SGOT (AST) 17 (15-41 NOTE: New Reference Range  12/22/14)  Total Protein, Serum 7.2 (6.5-8.1 NOTE: New Reference Range  12/22/14)  Albumin, Serum 3.9 (3.5-5.0 NOTE: New reference range  12/22/14)  Routine Chem:  20-Apr-16 02:54   Glucose, Serum 92 (65-99 NOTE: New Reference Range  12/22/14)  BUN  26 (6-20 NOTE: New Reference Range  12/22/14)  Creatinine (comp)  1.29 (0.44-1.00 NOTE: New Reference Range  12/22/14)  Sodium, Serum 135 (135-145 NOTE: New Reference Range  12/22/14)  Potassium, Serum  3.3 (3.5-5.1 NOTE: New Reference Range  12/22/14)  Chloride, Serum 103 (101-111 NOTE: New Reference Range  12/22/14)  CO2, Serum 25 (22-32 NOTE: New Reference Range  12/22/14)  Calcium (Total), Serum 9.3 (8.9-10.3 NOTE: New Reference Range  12/22/14)  eGFR (African American)  49  eGFR (Non-African American)  42 (eGFR  values <74m/min/1.73 m2 may be an indication of chronic kidney disease (CKD). Calculated eGFR is useful in patients with stable renal function. The eGFR calculation will not be reliable in acutely ill patients when serum creatinine is changing rapidly. It is not useful in patients on dialysis. The eGFR calculation may not be applicable to patients at the low and high extremes of body sizes, pregnant women, and vegetarians.)  Anion Gap 7  Routine Coag:  20-Apr-16 02:54   Activated PTT (APTT) 32.3 (A HCT value >55% may artifactually increase the APTT. In one study, the increase was an average of 19%. Reference: "Effect on Routine and Special Coagulation Testing Values of Citrate Anticoagulant Adjustment in Patients with High HCT Values." American Journal of Clinical Pathology 26286;381:771-165)  Prothrombin  16.2 (11.4-15.0 NOTE: New Reference Range  11/13/14)  INR 1.3 (INR reference interval applies to patients on anticoagulant therapy. A single INR therapeutic range for coumarins is not optimal for all indications; however, the suggested range for most indications is 2.0 - 3.0. Exceptions to the INR Reference Range may include: Prosthetic heart valves, acute myocardial infarction, prevention of myocardial infarction, and combinations of aspirin and anticoagulant. The need for a higher or lower target INR must be assessed individually. Reference: The Pharmacology and Management of the Vitamin K  antagonists: the seventh ACCP Conference on Antithrombotic and Thrombolytic Therapy. CBXUXY.3338Sept:126 (3suppl): [id number] A HCT value >55% may artifactually increase the PT.  In one study,  the increase was an average of 25%. Reference:  "Effect on Routine and Special Coagulation Testing Values of Citrate Anticoagulant Adjustment in Patients with High HCT Values." American Journal of Clinical Pathology 2006;126:400-405.)  Routine Hem:  20-Apr-16 02:54   WBC (CBC) 8.7  RBC (CBC) 3.80   Hemoglobin (CBC)  11.1  Hematocrit (CBC)  34.1  Platelet Count (CBC) 259  MCV 90  MCH 29.2  MCHC 32.5  RDW  16.3  Neutrophil % 60.9  Lymphocyte % 26.9  Monocyte % 7.3  Eosinophil % 4.1  Basophil % 0.8  Neutrophil # 5.3  Lymphocyte # 2.3  Monocyte # 0.6  Eosinophil # 0.4  Basophil # 0.1 (Result(s) reported on 03 Feb 2015 at 03:26AM.)    Assessment/Admission Diagnosis 1.  Peripheral vascular disease with limb threatening occlusive disease.  I have started her on a heparin drip and will plan for a femoral to distal bypass.  I have reviewed her films from last month and she has profound multilevel diease but I believ that a femoral distal bypass.  I have discussed the risks and benefits and the likelyhood of amputation.   She voices acceptance of these risks and asks that everything be done to save her leg. 2.  Chronic renal failure. Creatinine appears to be stable. She has dysfunctional left kidney. We will continue monitoring renal function. No further intervention needed at this time. Continue IV fluid. Nephrology consult is called in by Dr. SDelana Meyertoday.  3.  Hypertension. The patient is on hydrochlorothiazide and lisinopril at home. Currently because  of post surgery we would just like to continue monitoring her blood pressure and hold medication if systolic blood pressure is less than 110.  4.  Anemia. Her hemoglobin was decreased during her last visit and will be followed carefully   Plan level 3 admission   Electronic Signatures: Hortencia Pilar (MD)  (Signed 20-Apr-16 23:28)  Authored: CHIEF COMPLAINT and HISTORY, PAST MEDICAL/SURGIAL HISTORY, ALLERGIES, HOME MEDICATIONS, FAMILY AND SOCIAL HISTORY, REVIEW OF SYSTEMS, PHYSICAL EXAM, LABS, ASSESSMENT AND PLAN   Last Updated: 20-Apr-16 23:28 by Hortencia Pilar (MD)

## 2015-02-14 NOTE — Discharge Summary (Signed)
PATIENT NAME:  [name], [name] MR#:  [id number] DATE OF BIRTH:  Mar 05, 1946  DATE OF ADMISSION:  01/07/2015 DATE OF DISCHARGE:  01/12/2015  DISCHARGE DIAGNOSIS:  Ischemia, bilateral lower extremities.   SECONDARY DIAGNOSES:   1.  Atherosclerotic occlusive disease, bilateral lower extremities.  2.  Complication of vascular device.  3.  Hypertension.  4.  Hypercholesterolemia.   PROCEDURES PERFORMED: 1.  Right lower extremity angiography with intervention, 01/07/2015.  2.  Angiography of the left lower extremity, 01/08/2015.  3.  Thromboendarterectomy of the common and deep femoral arteries with embolectomy of the superficial femoral artery, popliteal, and peroneal, 01/08/2015.   CONSULTATIONS:   1.  [facility] doctor, medical management.  2.  Dr. Thedore MinsSingh of nephrology for renal insufficiency.    HISTORY OF PRESENT ILLNESS:  [name] is a 69 year old woman who presented to the Emergency Room with ischemia of the right lower extremity. She was therefore undergoing attempts at limb salvage.   HOSPITAL COURSE:  On the day of admission, the patient underwent angiography with thrombolysis and intervention; this resulted in improved flow and her ischemic changes were alieved. She was initiated on IIb/IIIa inhibitor intravenously. During the night, her left foot was identified to become cold and mottled. On inspection, there was no Doppler signal and therefore on the 25th, she was returned to specials, where angiography confirmed occlusion on the left side. This was not a situation that was amenable to intervention within the common femoral. It appeared to be related to both a combination of thrombus as well as a dissection, and therefore, she was scheduled for emergent surgery. Later that day she did undergo successful endarterectomy of the common and profunda femoris arteries with reconstruction using a patch angioplasty and subsequently an embolectomy using a Fogarty catheter of the SFA, popliteal,  and peroneal. Postoperatively, both feet remained pink and warm. [facility] doctor was asked to evaluate her in the postoperative period, as was Dr. Thedore MinsSingh, as she had gotten several boluses of contrast for the angiograms and has known renal insufficiency. She did well over the course of the next 4 days and was started on heparin and then Coumadin. On postoperative day #4 from her endarterectomy, her INR was therapeutic. She was ambulating in the hall without assistance. She was tolerating a regular diet.   She is felt fit for discharge. She is discharged to home. She will continue her diet as instructed. She will maintain a dry dressing on her incision in the left groin. She will continue Coumadin and will follow up with me in the office in approximately 1 week.    ____________________________ Renford DillsGregory G. Page Lancon, MD ggs:nb D: 01/12/2015 17:09:22 ET T: 01/13/2015 05:19:53 ET JOB#: [id number]  cc: Renford DillsGregory G. Daivion Pape, MD, <Dictator> Renford DillsGREGORY G Alawna Graybeal MD ELECTRONICALLY SIGNED 01/26/2015 15:10

## 2015-02-14 NOTE — Consult Note (Signed)
CHIEF COMPLAINT and HISTORY:  Subjective/Chief Complaint right leg pain, syncope   History of Present Illness Patient with a known history of extensive PAD and multiple previous interventions presents today with a 3 hour history of right leg pain.  She was nauseated, vomited, and collapsed earlier today at work.  This was the result of acute, severe right leg pain.  This was 10/10 and persists as severe an unrelenting.  Started in hip and groin area and went down leg.  The left leg has some discoloration but is not painful.  Has been on Eliquis.  No new or inciting events.  Nothing really making the pain better.  Nothing out of the normal was happening to cause the pain.  In ER, right foot and leg appeared clearly ischemic with no distal pulses and we are asked to evaluate.   PAST MEDICAL/SURGICAL HISTORY:  Past Medical History:   disfunctional left kidney:    kidney stone:    acid reflux:    Stent in legs:    tubal pregnancy:   ALLERGIES:  Allergies:  No Known Allergies:   HOME MEDICATIONS:  Home Medications: Medication Instructions Status  apixaban 2.5 mg oral tablet 1 tab(s) orally 2 times a day Active  acetaminophen-oxyCODONE 325 mg-5 mg oral tablet 1 tab(s) orally every 4 hours, As needed, moderate pain (4-6/10) Active  aspirin 81 mg oral tablet 1 tab(s) orally once a day Active  famotidine 20   once a day Active   Family and Social History:  Family History Hypertension  Smoking   Social History positive  tobacco (Current within 1 year), negative ETOH, negative Illicit drugs   Place of Living Home   Review of Systems:  Subjective/Chief Complaint No TIA/stroke/seizure No heat or cold intolerance No dysuria/hematuria No blurry or double vision No tinnitus or ear pain No rashes or ulcer No suicidal ideation or psychosis No signs of bleeding or easy bruising No SOB/DOE, orthopnea, or sputum No palpitations or chest pain Had N/V and syncope with start of pain.  This  is better now No joint pain or joint swelling.. new onset right leg pain per HPI No fever or chills No unintentional weight loss or gain   Telemetry Reviewed NSR   Medications/Allergies Reviewed Medications/Allergies reviewed   Physical Exam:  GEN well developed, thin, appears in pain   HEENT pink conjunctivae, PERRL, hearing intact to voice   NECK No masses  trachea midline   RESP normal resp effort  no use of accessory muscles   CARD regular rate  no thrills  no JVD   VASCULAR ACCESS none   ABD denies tenderness  soft  normal BS   GU no superpubic tenderness   LYMPH negative neck, negative axillae   EXTR positive cyanosis/clubbing, negative edema, pulses in left foot are dopplerable to trace palpable, no pulses in right foot.  Right foot is cold and has poor cap refill   SKIN skin turgor good, right foot pale, left foot with some mild cyanosis.   NEURO cranial nerves intact, follows commands, motor/sensory function intact   PSYCH alert, A+O to time, place, person, good insight   Additional Comments labs pending   ASSESSMENT AND PLAN:  Assessment/Admission Diagnosis acute RLE ischemia with rest pain.  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   tobacco dependence   Plan Suspected acute on chronic thrombosis of her RLE interventions with limb threatening ischemia.  This is an emergent issue, and the fact  that she thrombosed on Eliquis is not a good sign for long term patency.  I discussed that this was a pressing issue, and immediate intervention will be required to attempt to restore patency.  If flow can not be restored, she would likely lose her leg.  May require extended thrombolysis as well and several days in the hospital.  Plan to go emergently to special procedures now.   level 3 admit   Electronic Signatures: Annice Needyew, Ermal Brzozowski S (MD)  (Signed 22-Jan-16 13:01)  Authored: Chief Complaint and History, PAST  MEDICAL/SURGICAL HISTORY, ALLERGIES, HOME MEDICATIONS, Family and Social History, Review of Systems, Physical Exam, Assessment and Plan   Last Updated: 22-Jan-16 13:01 by Annice Needyew, Cooper Stamp S (MD)

## 2015-02-14 NOTE — Op Note (Signed)
PATIENT NAME:  [name], [name] MR#:  [id number] DATE OF BIRTH:  1946/07/11  DATE OF PROCEDURE:  11/06/2014  PREOPERATIVE DIAGNOSES:  1.  She has severe peripheral arterial disease bilaterally.  2.  Acute on chronic ischemia of the right lower extremity with rest pain.  3.  Tobacco dependence.   POSTOPERATIVE DIAGNOSES: 1.  She has severe peripheral arterial disease bilaterally.  2.  Acute on chronic ischemia of the right lower extremity with rest pain.  3.  Tobacco dependence.   PROCEDURE: 1.  Ultrasound guidance for vascular access, left femoral artery.  2.  Catheter placement to right posterior tibial artery from left femoral approach.  3.  Aortogram and selective right lower extremity angiogram.  4.  Catheter-directed thrombolysis with 8 mg of t-PA to the left external iliac artery, right common iliac artery, right external iliac artery, right common femoral artery, right superficial femoral artery, right profunda femoris artery, right tibioperoneal trunk, and right posterior tibial artery.  5.  Mechanical rheolytic thrombectomy to same vessels, right leg.  6.  Percutaneous transluminal angioplasty with 5 mm diameter Lutonix drug-coated angioplasty balloon to the left external iliac artery.  7.  A 6 mm diameter self-expanding stent placement to the left external iliac artery for greater than 50% residual stenosis and dissection after angioplasty.  8.  Percutaneous transluminal angioplasty of right common iliac artery with 6 mm diameter angioplasty balloon.  9.  Percutaneous transluminal angioplasty of right external iliac artery with 5 mm diameter Lutonix drug-coated angioplasty balloon.  10.  Percutaneous transluminal angioplasty of right common femoral artery and proximal superficial femoral artery with 5 mm diameter Lutonix drug-coated angioplasty balloon.  11.  Percutaneous transluminal angioplasty of distal right superficial femoral artery and above-knee popliteal artery with 5 mm  diameter angioplasty balloon.  12.  Percutaneous transluminal angioplasty of mid right popliteal artery with 4 mm diameter Lutonix drug-coated angioplasty balloon.  13.  Percutaneous transluminal angioplasty of right tibioperoneal trunk and majority of right posterior tibial artery with 3 mm diameter angioplasty balloon.  14.  Viabahn covered stent placement to the right popliteal artery and tibioperoneal trunk with 5 mm diameter Viabahn stent for 2 areas of residual dissection and greater than 50% residual stenosis.   SURGEON: Annice Needy, MD   ANESTHESIA: Local with moderate conscious sedation.   BLOOD LOSS: 25 mL  FLUOROSCOPY TIME: 16 minutes.   CONTRAST USED: 150 mL  INDICATION FOR PROCEDURE: This is a 69 year old female who was admitted through the Emergency Room today for acute on chronic ischemia. She has severe peripheral vascular disease as a baseline and undergone multiple previous revascularization procedures. She has presented today with an acute onset of severe 10 out of 10 right lower extremity pain and pallor with a clear ischemic foot. She also had some mottling and cyanosis of her left foot, although this was not as painful. She is brought in for emergent angiography for hopes of limb salvage. The risks and benefits were discussed. Informed consent was obtained.   DESCRIPTION OF THE PROCEDURE: The patient is brought to the vascular suite. Groins were shaved and prepped, and a sterile surgical field was created. The left femoral head was localized with fluoroscopy. Ultrasound was used to visualize a patent left femoral artery. Although it was small, it was accessed under direct ultrasound guidance without difficulty with a Seldinger needle. A J-wire and 5-French sheath were placed. A pigtail catheter was placed in the aorta and then pulled down to the aortic bifurcation and  pelvic obliques were performed. 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 The right common iliac artery was thrombosed about 2 cm beyond its origin with the location of a second stent in the right iliac artery. This appeared to reconstitute in the distal external iliac artery and common femoral artery. It then re-thrombosed in the common femoral artery and the SFA stents were thrombosed. Initial opacification was poor. The left external iliac artery also appeared occluded, and it was unclear if this was thrombus or just stenosis.  I crossed the aortic bifurcation and advanced a wire down into the right, initially, SFA and then later all the way into the posterior tibial artery. Then, 2 mg of t-PA were instilled in the left external iliac artery and then this artery was ballooned with a 5 mm diameter Lutonix drug-coated angioplasty balloon. There was a dissection and residual stenosis, but there was improved flow, and I was able to then get a sheath up and over. I would later come back and place a 6 mm diameter by 8 cm length self-expanding stent into the left external iliac artery post dilated with a 5 mm balloon. It should be noted that the initial 5 mm balloon was a Lutonix drug-coated angioplasty balloon. This was done at the end of the procedure. I put a 6-French Ansell sheath up and over the bifurcation and heparinized the patient.  I performed catheter-directed thrombolysis in the right common iliac artery, right external iliac artery, common femoral artery, and superficial femoral artery initially, with imaging showing that there was thrombosis of the popliteal, tibioperoneal trunk, and tibial vessels. I then advanced a wire and catheter all the way down into the distal posterior tibial artery and confirmed intraluminal flow and performed more catheter-directed thrombolysis in the distal SFA, popliteal artery, tibioperoneal trunk, and posterior tibial artery. This was allowed to dwell for 15 minutes. Mechanical rheolytic thrombectomy was then  performed throughout the same vessels. The common iliac artery, external iliac artery, common femoral artery, superficial femoral artery both proximally and distally and throughout its course through the stents, popliteal artery, tibioperoneal trunk and down into the posterior tibial artery. After this, there were still multiple areas of residual stenosis and thrombus throughout the right lower extremity, but now we would at least opacify some flow.  The tibioperoneal trunk and posterior tibial artery down to just above the ankle were treated with a 3 mm diameter angioplasty balloon. However, there was a significant dissection in the tibioperoneal trunk. This would later be treated with a 4 mm diameter angioplasty balloon. For stenosis in the popliteal artery at the knee below the previously placed stents, a 4 mm diameter Lutonix drug-coated angioplasty balloon was inflated. Again, narrowing was seen, which resolved with angioplasty. At the bottom of the SFA stent within the SFA stent, a 5 mm diameter conventional angioplasty balloon was inflated. There was still residual stenosis and dissection in the popliteal artery, where it was treated with a 4 mm balloon, and the tibioperoneal trunk which was treated with a 3 mm and 4 mm diameter angioplasty balloon. I used a 5 mm diameter by 25 cm in length Viabahn covered stent from the tibioperoneal trunk all the way up into the previously placed stent into the distal SFA. This was post dilated with a 4 distally and a 5 balloon proximally, with improved flow. The common femoral artery and proximal superficial femoral artery were treated with a 5 mm diameter Lutonix drug-coated  angioplasty balloon with a good angiographic result and improved flow. The iliac vessels, I treated the external iliac artery with a 5 mm diameter Lutonix drug-coated angioplasty balloon and the common iliac artery with a 5 mm balloon and a 6 mm balloon, with markedly improved flow and no  significant residual stenosis in the iliac vessels.  At this point, she still had significant spasm distally. Her circulation was quite slow, but I felt we had improved her circulation as best possible, so if she rethromboses she would likely require amputation. We will admit her to the hospital for continued anticoagulation therapy. We also did perform the 6 mm diameter self-expanding stent in the left external iliac artery post dilated with a 5 mm balloon prior to completion. Her left femoral artery was too small for a closure device, and manual pressure was held. The patient tolerated the procedure well and was taken to the recovery room in stable condition.    ____________________________ Annice NeedyJason S. Magdelena Kinsella, MD jsd:ST D: 11/06/2014 15:56:39 ET T: 11/06/2014 23:55:10 ET JOB#: [id number]  cc: Annice NeedyJason S. Masako Overall, MD, <Dictator> Annice NeedyJASON S Jaleigh Mccroskey MD ELECTRONICALLY SIGNED 11/18/2014 11:52

## 2015-02-14 NOTE — Op Note (Signed)
PATIENT NAME:  [name], MENNEN MR#:  [id number] DATE OF BIRTH:  [birth year]  DATE OF PROCEDURE:  02/04/2015  PREOPERATIVE DIAGNOSES:  1.  Ischemia right foot.  2.  Atherosclerotic occlusive disease, bilateral lower extremities with rest pain right lower extremity.  3.  Complication of vascular device with occlusion of previously placed stents.   POSTOPERATIVE DIAGNOSIS:  1.  Ischemia right foot.  2.  Atherosclerotic occlusive disease, bilateral lower extremities with rest pain right lower extremity.  3.  Complication of vascular device with occlusion of previously placed stents.   PROCEDURES PERFORMED:  1.  Right femoral to right posterior tibial bypass grafting with prosthetic.  2.  Right posterior tibial embolectomy by Fogarty catheter.  3.  Right peroneal embolectomy by Fogarty catheter.  4.  Right common femoral endarterectomy.   PROCEDURE PERFORMED BY:  Renford Dills, MD  FIRST ASSISTANT:  Dr. Wyn Quaker.  ANESTHESIA:  General by LMA.   FLUIDS:  Per anesthesia record.   ESTIMATED BLOOD LOSS:  150 mL.   SPECIMEN:  1.  Thrombus from posterior tibial sent as specimen.  2.  Thrombus peroneal artery sent as specimen.  3.  Plaque from common femoral sent as separate specimen.   INDICATIONS:  [name] is a 69 year old woman who presented to the hospital with acute onset of rest pain.  She recently underwent thrombolysis for similar symptoms and was started on Coumadin post procedure.  However, on admission, her INR was noted to be 1.3.  She recently underwent surgical revision of her left groin area and therefore she is not a candidate for thrombolysis at this time.  She is therefore undergoing open surgical revascularization.  The risks and benefits as well as the high probability of limb loss has been discussed in detail.  All questions are answered.  The patient requests that all be done to save her foot. She wishes to proceed with surgery.   DESCRIPTION OF PROCEDURE:  The  patient is taken to the operating room and placed in the supine position.  After adequate general anesthesia is induced and appropriate invasive monitors are placed, she is positioned supine and her right leg is circumferentially prepped and draped in a sterile fashion.  Appropriate timeout is called.   A vertical incision is made in the groin overlying the femoral impulse and the dissection is carried down through the soft tissues.  Lymphatic tissue is ligated and divided between 3-0 and 2-0 silk ties.  The femoral sheath is opened and the common femoral artery is identified. Dissection is then carried circumferentially around the common femoral artery identifying 3 individual but reasonably sized branches that comprise the profunda system.  The distal external carotid artery feels soft with just a small amount of posterior plaque and therefore it is dissected circumferentially just above the circumflex vessels.  The circumflex vessels are looped as is the distal external.  The SFA is occluded and so it is not looped, but the 3 branches comprising the profunda are each individually looped.   Attention is then turned to the saphenofemoral junction which is skeletonized.  The saphenous vein appears to be only 3 mm or so at the level of the femoral junction and therefore I have elected to expose the vein more distally to see if it would be reasonable as conduit at that level instead of proceeding with exposure of the vein along the entire thigh.   Attention is then turned to the medial calf where the knee is flexed and a  skin incision was made.  The saphenous vein is identified at this level.  It has a bifurcation at the knee and although it is perhaps 3 mm just above the knee, once this area bifurcates, it divides into 2 branches each approximately 1.5 mm in size.  The slightly larger of the 2 is followed for another several centimeters but quickly becomes increasingly smaller and therefore completely  inadequate as conduit.   The fascia of the medial calf is then entered and the muscles taken down reflecting them posteriorly exposing the distal popliteal.  This area is then skeletonized identifying the origin of the anterior tibial and subsequently the tibioperoneal trunk.  At this point, the posterior tibial artery is then identified and the dissection is carried for approximately 2 to 2.5 cm along the posterior tibial.  The origin of the peroneal is also identified.  This is looped with a silastic vessel loop.   The Impra tunneler is then used to create a tunnel subsartorially passing between the femoral condyles and into the popliteal fossa.  A 6 mm Impra distal flow graft is then pulled through the subcutaneous tunnel taking care to align the racing stripe and ensure that it is not twisted. Heparin 5000 units is given and allowed to circulate for 4 minutes.   The distalmost aspect of the tibioperoneal trunk is then opened with an 11 blade and extended into the posterior tibial through its origin for a distance of approximately 14 mm matching the length of the distal flow.  Upon opening the artery, acute looking thrombus is noted.   Therefore a #2 Fogarty is then advanced.  This advances all the way down passed the ankle.  Two passes are made with the return of bright red blood.  The posterior tibia was then irrigated with heparinized saline with a Marks tip and attention is turned to the peroneal which also demonstrates acute thrombus at its origin.  Once again, the #2 Fogarty is advanced down.  This time it advances to approximately 30 cm which is just above the ankle consistent with its terminus.  Again 2 passes are made with the return of bright red blood.  This too is irrigated with heparinized saline.   Using CV-7 suture and distal flow to side, posterior tibial artery is then anastomosed, then fashioned leaving approximately a 5 mm gap on one side.  It is secured with interrupted CV-7 sutures  at this point.  This will allow for flushing.   Attention is then turned to the common femoral which is clamped proximally using the Silastic vessel loop and a vascular clamp at the distal external.  The profunda branches are secured with the Silastic vessel loops.  Arteriotomy is made and extended with Potts scissors.  A huge amount of bulky plaque nearly occlusive is identified and endarterectomy is performed under direct visualization from the origin of the SFA to the distal external iliac obtaining a nice edge at both locations.  The profunda branches demonstrated a small amount of plaque at their origins but essentially the femoral endarterectomy treated this and therefore individual profunda endarterectomies were not necessary.  All three branches were irrigated with heparinized saline.   The graft was then checked for length with the leg in extended position and essentially, the graft was then beveled using a long spatulated hood to serve as the patch over the entire length of the common femoral.  CV-6 suture was then used to apply the end graft to side common femoral. Flushing  maneuvers were performed.  The graft was flushed out by the posterior tibial.   Posterior tibial and peroneal were flushed and then the suture line distally was completed and flow established into the tibials.   After hemostasis was obtained, irrigation of both wounds was performed.  A silver metallic ring was then placed which marks the origin of the prosthetic graft at the common femoral.  Surgicel and Evicel are then placed in both incisions.  The calf is then closed using 2 layers of 3-0 followed by surgical staples for skin.  The groin is closed using 4 layers of Vicryl, starting with 2-0, working to 3-0, and then surgical staples.  Sterile dressings are applied.  The patient tolerated the procedure well.  There were no immediate complications.  Sponge and needle counts were correct and she was taken to the recovery area in  stable condition.      ____________________________ Renford DillsGregory G. Schnier, MD ggs:852 D: 02/04/2015 18:33:22 ET T: 02/04/2015 19:03:30 ET JOB#: [id number]  cc: Renford DillsGregory G. Schnier, MD, <Dictator> Renford DillsGREGORY G SCHNIER MD ELECTRONICALLY SIGNED 02/09/2015 12:49

## 2015-02-14 NOTE — Op Note (Signed)
PATIENT NAME:  [name], [name] MR#:  [id number] DATE OF BIRTH:  06-18-1946  DATE OF PROCEDURE:  01/08/2015  PREOPERATIVE DIAGNOSES:  1.  Ischemia of the left lower extremity.  2.  Atherosclerotic occlusive disease bilateral lower extremities with rest pain right lower extremity.  3.  History of cerebrovascular accident.  4.  Lack of appropriate intravenous access.  5.  Recent fall with traumatic laceration of the head.   POSTOPERATIVE DIAGNOSES:  1.  Ischemia of the left lower extremity.  2.  Atherosclerotic occlusive disease bilateral lower extremities with rest pain right lower extremity.  3.  History of cerebrovascular accident.  4.  Lack of appropriate intravenous access.  5.  Recent fall with traumatic laceration of the head.   PROCEDURES PERFORMED:  1.  Left common femoral endarterectomy.  2.  Left profunda femoris endarterectomy.  3.  Thromboembolectomy of left SFA, popliteal, and tibial arteries using Fogarty balloon tipped catheter.  4.  Insertion of central line with ultrasound guidance.   PROCEDURE PERFORMED BY: Renford DillsGregory G. Eutha Cude, MD   ANESTHESIA: General by endotracheal intubation.   FLUIDS: Per anesthesia record.   ESTIMATED BLOOD LOSS: 200 mL.   SPECIMEN:  1.  Plaque from the common femoral as well as the profunda femoris to pathology for permanent section.  2.  Thrombus from tibial, popliteal, and SFA embolectomy to pathology as separate specimen.   INDICATIONS: [name] is a 69 year old woman who presented to the hospital yesterday with acute ischemia of the right lower extremity. She underwent successful intervention and was maintained on Aggrastat overnight. Early this morning she developed painful left lower extremity with mottling of her foot and absence of Doppler signals. Angiography this morning demonstrated severe atherosclerotic occlusive disease of the common femoral with thrombus and dissection noted extending into the profunda femoris and  occlusion of the SFA. Risks and benefits for open treatment were reviewed. All questions answered. Family and patient have agreed to proceed.   DESCRIPTION OF PROCEDURE: The patient is taken to the operating room and placed in the supine position. After adequate general anesthesia was induced, A line was placed. Subsequently I am asked to place a triple-lumen catheter as the patient has limited IV access. The left neck is prepped and draped in a sterile fashion. Ultrasound is placed in a sterile sleeve. Jugular vein is echolucent and compressible indicating patency. Image is recorded for the permanent record, and under real-time visualization, the jugular vein is accessed with a Seldinger needle,  J-wire is advanced without difficulty, dilators passed over the wire, and the triple-lumen catheter is advanced. All 3 lumens aspirate and flush and the catheter secured to the skin of the neck with 2-0 silk and a Biopatch and sterile dressing is applied.   The patient is then prepped and draped circumferential fashion for the left leg and up onto the abdominal wall above the umbilicus. Appropriate timeout is then called.   Vertical incision is made in the left groin and the dissection is carried down to expose the femoral artery. Artery is identified at the level of the ilioinguinal ligament as there is significant scar tissue more distally from her many angiograms and interventions. Ultimately, however, the dissection is extended opening the ilioinguinal ligament slightly and exposing the distal external iliac. Circumferential dissection was carried out at the level all the way to the level of the palpable stent that is placed in the external iliac as that is an area by angiography from this morning that demonstrates clear true  lumen with good flow. The dissection is then extended exposing the SFA for approximately 4 to 5 cm and circumferentially dissecting it, several small branches are ligated. Several small  collaterals on the common femoral also ligated and divided between silk ties. Venous branches crossing are also ligated and divided between silk ties. The profunda femoris is then identified and then the dissection is carried down to the division. A total of 6 blue vessel loops are used to isolate each individual branch. Red vessel loops were passed around the external iliac and the SFA. Heparin 4000 units is given and allowed to circulate for 5 minutes.   The external iliac is then clamped. Vessel loops are used to control distal vessels and arteriotomy is made with an 11 blade, extended with Potts scissors. It is extended up approximately 1 cm into the external and then down to the major secondary division of the profunda femoris along its trunk. Thromboendarterectomy is then performed under direct visualization from the profunda branches up to the external iliac. Once smooth endarterectomy surface has been achieved, a Fogarty catheter is then used. It is advanced down to 60 cm, which by approximation is just above the ankle. It passes freely, and after the initial pass, a large amount of thrombus and organized material was returned. Secondary passes were then made until the final passage demonstrated no retrieval of any thrombotic material. The SFA was then irrigated copiously with heparinized saline and re-controlled with a Silastic vessel loop.   CorMatrix patch was then rehydrated on the back table and applied to repair the arterial injury beginning in the external and then putting it down onto the profunda femoris. Prolene 6-0 was used to apply the patch. Flushing maneuvers were performed and then flow is re-established first to the profunda and then the SFA. Excellent distal pulses are noted in all the branches of the profunda as well as in the SFA.   Several points along the suture line are controlled with interrupted 6-0 Prolene. Once hemostasis is complete, the wound is irrigated with a liter of  saline and it is then inspected. Bleeding points are cauterized with the Bovie and subsequently Evicel and Fibrillar are applied. The wound is then closed in 5 layers using 2 layers of 0 Vicryl, a layer of 2-0, and then 2 layers of 3-0. Staples close the skin. The patient tolerated the procedure well. There were no immediate complications. Sponge and needle counts are correct. She is taken to the recovery area in stable condition.    ____________________________ Renford Dills, MD ggs:AT D: 01/08/2015 16:04:55 ET T: 01/09/2015 01:02:22 ET JOB#: [id number]  cc: Renford Dills, MD, <Dictator> Renford Dills MD ELECTRONICALLY SIGNED 01/26/2015 15:10

## 2015-02-14 NOTE — Consult Note (Signed)
PATIENT NAME:  [name], KRONICK MR#:  [id number] DATE OF BIRTH:  10-15-1946  DATE OF CONSULTATION:  01/08/2015  REFERRING PHYSICIAN:   CONSULTING PHYSICIAN:  Merilee Wible G. Elisabeth Pigeon, MD  REFERRING PHYSICIAN: Dr. Festus Barren, vascular.   REASON FOR CONSULTATION::  Medical management.   HISTORY OF PRESENT ILLNESS: This is a 69 year old female, extensive history of vascular disease and multiple interventions in bilateral lower extremity. She came yesterday with right lower extremity pain and numbness. Right leg was a little cold on presentation.  She was taken for surgery yesterday by Dr. Wyn Quaker. She had abdominal aortogram with runoff right lower extremity, catheter-directed thrombolysis, and mechanical thrombectomy with right saphenofemoral artery, popliteal, posterior tibial, and peroneal arteries. After surgery and procedure she was monitored on telemetry floor last night.  Today morning she had severe color changes on the left lower extremity and coldness, so Dr. Gilda Crease saw her in the morning and she was noted to have left dissection of common femoral artery with thrombus, taken to OR and surgery was done for left common femoral endarterectomy and left profunda femoris endarterectomy, and thromboembolectomy, and left IJ central line was placed, and she was sent to CCU for further management and monitoring after surgery. Medical consult was called in for further management of her medical issues. Currently during my exam the patient is still drowsy under effect of sedation from the anesthesia, so could not get any review of systems.   PAST MEDICAL HISTORY:   1. Dysfunctional left kidney.  2. Kidney stone.  3. Acid reflux.  4. Severe peripheral vascular disease and chronic ischemia, multiple stent in the legs.  5. Tubal pregnancy.   FAMILY HISTORY: Positive for coronary artery disease, hypertension, and smoking.   SOCIAL HISTORY: Positive for smoking. Negative for alcohol or illegal drug use. She  lives at home.   REVIEW OF SYSTEMS: Unable to get as the patient is still slightly sedated after surgery.   HOME MEDICATIONS:  1. Acetaminophen and oxycodone tablet every 4 hours as needed for moderate pain.  2. Plavix 75 mg oral once a day.  3. Hydrochlorothiazide and lisinopril 12.5 plus 20 mg once a day.  4. Chantix 0.5 mg oral tablet 2 times a day   PHYSICAL EXAMINATION:  VITAL SIGNS: Currently temperature 97.5, pulse 77, respirations 12, blood pressure 103/58, and pulse oximetry is 100% on 3 liters nasal cannula oxygen.  GENERAL: The patient is slightly drowsy, but opens eyes and moves limbs to stimuli, then goes back to sleep.  HEENT:  Pupils are bilaterally reactive to light. Sclerae anicteric. Conjunctivae pale. There is a small laceration wound present on right side of the forehead.  NECK: Supple. No JVD. Thyroid nontender.  RESPIRATORY: Bilateral equal and clear air entry. No crepitation or wheezing.  CARDIOVASCULAR: S1, S2 present. Systolic murmur present. Regular rhythm.  ABDOMEN: Soft, nontender. Bowel sounds present. No organomegaly.  SKIN: No acne, rashes, or lesions, but laceration on right forehead present and there is dressing after surgery today on left thigh.  LEGS: No edema.  NEUROLOGICAL: She has withdrawal to painful stimuli response, but she is drowsy, so not able to follow command. No tremor or rigidity.  PSYCHIATRIC: Unable to check because of drowsiness.  JOINTS: No swelling or tenderness.  IMPORTANT LABORATORY RESULTS:  1.  Glucose 105, BUN 20, creatinine 1.14, sodium 134, potassium 3.5, chloride 105, CO2 of 25, anion gap is 4, calcium 8.2.  2.  WBC count 5.7, hemoglobin 7.3, platelets 180,000, MCV 88.   ASSESSMENT  AND PLAN: A 69 year old female who is admitted to vascular surgical team because of chronic leg ischemia, presented with right-sided pain and ischemia after angioplasty and stenting was done yesterday, but overnight she had sudden worsening and  coldness on the left side of the leg, noted to have dissection and thrombosis on the left side common femoral, surgery was done and admitted to CCU for further management and monitoring.   1.  Peripheral vascular disease, dissection, and thrombectomy as per the vascular team today. She is currently on a heparin drip for that. Management per primary team post surgically. 2.  Chronic renal failure. Creatinine appears to be stable. She has dysfunctional left kidney. We will continue monitoring renal function. No further intervention needed at this time. Continue IV fluid. Nephrology consult is called in by Dr. Gilda CreaseSchnier today.  3.  Hypertension. The patient is on hydrochlorothiazide and lisinopril at home. Currently because of post surgery we would just like to continue monitoring her blood pressure and hold medication if systolic blood pressure is less than 110.  4.  Anemia. Her hemoglobin was 10 yesterday, most likely acute drop because of surgery. Management as per primary team. May require blood transfusion.   CODE STATUS: Full code.   TOTAL TIME SPENT ON THIS CONSULT: 40 minutes.   We will continue following with you for any medical issues.      ____________________________ Hope PigeonVaibhavkumar G. Elisabeth PigeonVachhani, MD vgv:bu D: 01/08/2015 17:01:46 ET T: 01/08/2015 17:38:18 ET JOB#: [id number]  cc: Hope PigeonVaibhavkumar G. Elisabeth PigeonVachhani, MD, <Dictator> Altamese DillingVAIBHAVKUMAR Reynard Christoffersen MD ELECTRONICALLY SIGNED 01/21/2015 0:33

## 2015-02-15 LAB — URINE CULTURE

## 2015-02-15 NOTE — H&P (Signed)
PATIENT NAME:  [name], [name] MR#:  [id number] DATE OF BIRTH:  1946-09-09  DATE OF ADMISSION:  02/12/2015  PRESENTING COMPLAINT: Fever of 102.6.   HISTORY OF PRESENT ILLNESS: Ms. 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 Home health nurse visited today. She was found to have temperature of 102.6. The patient was sent to the Emergency Room. Her current temperature is 100.6. She denies any complaints of fever, denies any complaints of cough, dysuria, abdominal pain, nausea or vomiting. She had some constipation earlier which resolved. During her work-up in the Emergency Room, she was found to have left lower lobe early pneumonia. The patient is asymptomatic; denies any back pain or any cough. Her white count is 10.6. It was also noted that the patient is hyponatremic, hypokalemic and has low phosphorus. She is being admitted for further evaluation and management.   PAST MEDICAL HISTORY: 1. Bilateral lower extremity very severe peripheral vascular disease with right lower extremity acute thrombosis status post right above-knee amputation secondary to ischemic foot. This was done on 02/05/2015.  2. Coronary artery disease.  3. Chronic smoker, former.  4. Dysfunctional left kidney.  5. History of kidney stones.  6. GERD.   7. History of tubal pregnancy.   ALLERGIES: MORPHINE.   MEDICATIONS: Milk of magnesia 30 mL p.o. daily.  Famotidine 20 mg p.o. daily.  Eliquis 5 mg p.o. daily.  Aspirin 81 mg daily.  Acetaminophen/oxycodone 5/325 mg 1 tablet every 4 hours.   SOCIAL HISTORY: She lives at home with her husband. Former smoker. Denies any alcohol use.   FAMILY HISTORY: Positive for hypertension.   REVIEW OF SYSTEMS:  CONSTITUTIONAL: Positive for fever and weakness. No fatigue.  EYES: No blurred or double vision,  glaucoma, or cataracts.  EARS, NOSE, THROAT: No tinnitus, ear pain, hearing loss.  RESPIRATORY: No cough, wheeze, hemoptysis. No COPD.   CARDIOVASCULAR: No chest pain, orthopnea, edema, dyspnea on exertion or cough.  GASTROINTESTINAL: No nausea, vomiting, diarrhea, abdominal pain. GENITOURINARY: No dysuria, hematuria, frequency.  ENDOCRINE: No nocturia or thyroid problems.  HEMATOLOGY: No anemia or easy bruising or bleeding.  SKIN: No acne, rash or lesion.  MUSCULOSKELETAL: Positive for arthritis. No swelling or gout. NEUROLOGIC: No CVA, TIA, dementia, or dysarthria. PSYCHIATRIC: No anxiety, bipolar disorder or depression.  All other systems reviewed and negative.   PHYSICAL EXAMINATION: GENERAL: The patient is awake, alert, oriented x3. In no acute distress. VITAL SIGNS: Temperature is 100.4, pulse is 95, blood pressure is 128/63, saturation 95% on room air. HEENT: Atraumatic, normocephalic. PERRLA. EOMI. Oral mucosa is moist.  NECK: Supple. No JVD. No carotid bruit.  LUNGS: Clear to auscultation bilaterally. No rales, rhonchi, respiratory distress, or labored breathing.  CARDIOVASCULAR: Mild tachycardia. No murmur heard. PMI not lateralized. Chest nontender.  EXTREMITIES: Good pedal pulses on the left. Good femoral pulses. Right below knee amputation. Incision looks okay. There is no cellulitis or any active drainage.  NEUROLOGIC: Grossly intact, cranial nerves II through XII. No motor or sensory deficit.  PSYCHIATRIC: The patient is awake, alert, oriented x3.   Chest x-ray shows new left lower lobe pneumonia and pleural effusion. Lactic acid 1.8. White count is 10.4. H and H is 8.6 and 25.9. Glucose is 110. Sodium is 127, potassium is 3.3, chloride is 94, total protein is 6.2. Magnesium is 2. Troponin is 0.13. Phosphorus is 1.6. PT-INR within  normal limits.   ASSESSMENT: 69 year old, [name], with recent right below-knee amputation due to ischemic right foot which was recently done  on 02/05/2015, along with history of severe peripheral vascular disease, comes in with:  1. Fever with chest x-ray consistent with left lower lobe pneumonia. The patient is asymptomatic with no cough or elevated white count. She is currently feeling stable. Will admit the patient to the medical floor. She received IV vancomycin and Zosyn. I will discontinue p.o. Levaquin, monitor her fever curve, monitor blood cultures. The patient does not have any cough, does not make any sputum; hence, sputum culture has not been sent.  2. Electrolyte abnormality with hyponatremia, hypokalemia and hypophosphatemia. Will replete with IV normal saline and Neutra-Phos packets. Repeat labs in the morning.  3. Recent right below-knee amputation status post right ischemic foot with a history of severe peripheral vascular disease in the setting of a former smoker. [name] amputation stump looks okay. No active drainage. Dressing change was done.  4. Borderline elevated troponin. The patient has a history of coronary artery disease; however, she is chest-pain free. Will continue to monitor her symptoms. If she has any chest pain, will do further work-up.   5. Deep vein thrombosis prophylaxis. She is already on Eliquis, which will continue.  6. Further work-up according to [name] clinical course. Hospital admission plan was discussed with the patient and [name] family members.   TIME SPENT: 40 minutes.    ____________________________ Wylie Hail Allena Katz, MD sap:lm D: 02/12/2015 20:02:07 ET T: 02/12/2015 20:19:47 ET JOB#: [id number]  cc: Luvina Poirier A. Allena Katz, MD, <Dictator> Willow Ora MD ELECTRONICALLY SIGNED 02/13/2015 15:12

## 2015-02-15 NOTE — Discharge Summary (Addendum)
PATIENT NAME:  [name], [name] DATE OF BIRTH:  [birth year]  DATE OF ADMISSION:  02/12/2015 DATE OF DISCHARGE:  02/13/2015   ADMISSION DIAGNOSES: 1.  Pneumonia of left upper lobe.  2.  Low phosphate levels.   DISCHARGE DIAGNOSES: 1.  Left upper lobe pneumonia.  2.  Hypophosphatemia and hypokalemia, repleted.  3.  Recent above-the-knee amputation of the right lower extremity.   PHYSICAL EXAMINATION AT DISCHARGE: VITAL SIGNS: The patient's temperature 98.3, pulse 88, respirations 17, blood pressure 135/74, saturation 93% on room air.  GENERAL: The patient is alert, oriented, not in acute distress.  CARDIOVASCULAR: Regular rate and rhythm, no murmur, gallops, or rubs.    LUNGS: Clear to auscultation without crackles, rales, rhonchi, or wheezing.  ABDOMEN: Bowel sounds positive, nontender, nondistended.  EXTREMITIES: The right lower extremity stump incision is clean and intact.   HOSPITAL COURSE: This is a very pleasant 69 year old female with recent AKA, who  presented with a fever of 102.6 at home. For further details, please refer to the H and P.  1. Left upper lobe pneumonia. The patient presented with a fever. She has a left upper lobe pneumonia as well as a urinary tract infection. She was placed on Levaquin. She did quite well with this. She was afebrile throughout the night. She did not have elevation in her white blood cell count. She denies any cough or chills. She will continue with Levaquin to treat her pneumonia as well as urinary tract infection, blood culture negative to date.  2.  Electrolyte abnormalities including hyponatremia, hypophosphatemia, and hypokalemia. Her phosphate level and potassium were repleted, sodium has improved.  3.  A recent right below the knee amputation. The patient will continue with wound care and follow-up with vascular in 1 week.  4.  Borderline elevated troponin. The patient did not have any chest pain. This is likely due to  problem #1.    DISCHARGE LABORATORIES:  1.  Sodium 131, potassium 2.0, chloride 103, bicarbonate 23, BUN 10, creatinine 0.83, glucose is 104.  2.  Phosphorus is 1.6.  3.  Urine culture negative to date.  4.  Blood cultures negative to date.   DISCHARGE MEDICATIONS:  1.  Levaquin 750 mg daily for 6 days.  2.  Acetaminophen oxycodone 1 tablet q. 4 hours p.r.n.  3.  Eliquis 5 mg daily.  4.  Aspirin 81 mg daily.  5.  Famotidine 20 mg daily.  6.  Milk of magnesia 30 mL daily p.r.n.  7.  Potassium phosphate - sodium phosphate 1 tablet b.i.d. for 2 days.   DISPOSITION: Discharge home health for dressing changes.   DISCHARGE DIET: Regular diet.   DISCHARGE ACTIVITY: As tolerated.   DISCHARGE FOLLOWUP:   1.  Patient will follow up with vascular in 1 week. Patient is stable for discharge.  2. The patient will also need a repeat chest x-ray in 4 to 6 weeks given her past history of tobacco dependence.    ____________________________ Calisha Tindel P. Juliene PinaMody, MD spm:nt D: 02/13/2015 12:55:34 ET T: 02/14/2015 08:22:29 ET JOB#: [id number]  cc: Eowyn Tabone P. Juliene PinaMody, MD, <Dictator> Mary SellaEric M. Mayford Knifeurner, PA-C Lyzbeth Genrich P Olon Russ MD ELECTRONICALLY SIGNED 02/19/2015 13:31

## 2015-02-18 DIAGNOSIS — Z48812 Encounter for surgical aftercare following surgery on the circulatory system: Secondary | ICD-10-CM | POA: Diagnosis not present

## 2015-02-18 DIAGNOSIS — K219 Gastro-esophageal reflux disease without esophagitis: Secondary | ICD-10-CM | POA: Diagnosis not present

## 2015-02-18 DIAGNOSIS — I129 Hypertensive chronic kidney disease with stage 1 through stage 4 chronic kidney disease, or unspecified chronic kidney disease: Secondary | ICD-10-CM | POA: Diagnosis not present

## 2015-02-18 DIAGNOSIS — I739 Peripheral vascular disease, unspecified: Secondary | ICD-10-CM | POA: Diagnosis not present

## 2015-02-18 DIAGNOSIS — Z5181 Encounter for therapeutic drug level monitoring: Secondary | ICD-10-CM | POA: Diagnosis not present

## 2015-02-18 DIAGNOSIS — Z89611 Acquired absence of right leg above knee: Secondary | ICD-10-CM | POA: Diagnosis not present

## 2015-02-18 DIAGNOSIS — N189 Chronic kidney disease, unspecified: Secondary | ICD-10-CM | POA: Diagnosis not present

## 2015-02-18 DIAGNOSIS — Z7901 Long term (current) use of anticoagulants: Secondary | ICD-10-CM | POA: Diagnosis not present

## 2015-02-18 DIAGNOSIS — D649 Anemia, unspecified: Secondary | ICD-10-CM | POA: Diagnosis not present

## 2015-02-23 DIAGNOSIS — I129 Hypertensive chronic kidney disease with stage 1 through stage 4 chronic kidney disease, or unspecified chronic kidney disease: Secondary | ICD-10-CM | POA: Diagnosis not present

## 2015-02-23 DIAGNOSIS — Z89611 Acquired absence of right leg above knee: Secondary | ICD-10-CM | POA: Diagnosis not present

## 2015-02-23 DIAGNOSIS — I739 Peripheral vascular disease, unspecified: Secondary | ICD-10-CM | POA: Diagnosis not present

## 2015-02-23 DIAGNOSIS — K219 Gastro-esophageal reflux disease without esophagitis: Secondary | ICD-10-CM | POA: Diagnosis not present

## 2015-02-23 DIAGNOSIS — D649 Anemia, unspecified: Secondary | ICD-10-CM | POA: Diagnosis not present

## 2015-02-23 DIAGNOSIS — Z48812 Encounter for surgical aftercare following surgery on the circulatory system: Secondary | ICD-10-CM | POA: Diagnosis not present

## 2015-02-23 DIAGNOSIS — Z5181 Encounter for therapeutic drug level monitoring: Secondary | ICD-10-CM | POA: Diagnosis not present

## 2015-02-23 DIAGNOSIS — N189 Chronic kidney disease, unspecified: Secondary | ICD-10-CM | POA: Diagnosis not present

## 2015-02-23 DIAGNOSIS — Z7901 Long term (current) use of anticoagulants: Secondary | ICD-10-CM | POA: Diagnosis not present

## 2015-02-26 DIAGNOSIS — Z5181 Encounter for therapeutic drug level monitoring: Secondary | ICD-10-CM | POA: Diagnosis not present

## 2015-02-26 DIAGNOSIS — N189 Chronic kidney disease, unspecified: Secondary | ICD-10-CM | POA: Diagnosis not present

## 2015-02-26 DIAGNOSIS — I739 Peripheral vascular disease, unspecified: Secondary | ICD-10-CM | POA: Diagnosis not present

## 2015-02-26 DIAGNOSIS — D649 Anemia, unspecified: Secondary | ICD-10-CM | POA: Diagnosis not present

## 2015-02-26 DIAGNOSIS — Z7901 Long term (current) use of anticoagulants: Secondary | ICD-10-CM | POA: Diagnosis not present

## 2015-02-26 DIAGNOSIS — K219 Gastro-esophageal reflux disease without esophagitis: Secondary | ICD-10-CM | POA: Diagnosis not present

## 2015-02-26 DIAGNOSIS — I129 Hypertensive chronic kidney disease with stage 1 through stage 4 chronic kidney disease, or unspecified chronic kidney disease: Secondary | ICD-10-CM | POA: Diagnosis not present

## 2015-02-26 DIAGNOSIS — Z48812 Encounter for surgical aftercare following surgery on the circulatory system: Secondary | ICD-10-CM | POA: Diagnosis not present

## 2015-02-26 DIAGNOSIS — Z89611 Acquired absence of right leg above knee: Secondary | ICD-10-CM | POA: Diagnosis not present

## 2015-03-01 DIAGNOSIS — I739 Peripheral vascular disease, unspecified: Secondary | ICD-10-CM | POA: Diagnosis not present

## 2015-03-01 DIAGNOSIS — K219 Gastro-esophageal reflux disease without esophagitis: Secondary | ICD-10-CM | POA: Diagnosis not present

## 2015-03-01 DIAGNOSIS — Z5181 Encounter for therapeutic drug level monitoring: Secondary | ICD-10-CM | POA: Diagnosis not present

## 2015-03-01 DIAGNOSIS — I129 Hypertensive chronic kidney disease with stage 1 through stage 4 chronic kidney disease, or unspecified chronic kidney disease: Secondary | ICD-10-CM | POA: Diagnosis not present

## 2015-03-01 DIAGNOSIS — Z48812 Encounter for surgical aftercare following surgery on the circulatory system: Secondary | ICD-10-CM | POA: Diagnosis not present

## 2015-03-01 DIAGNOSIS — Z7901 Long term (current) use of anticoagulants: Secondary | ICD-10-CM | POA: Diagnosis not present

## 2015-03-01 DIAGNOSIS — D649 Anemia, unspecified: Secondary | ICD-10-CM | POA: Diagnosis not present

## 2015-03-01 DIAGNOSIS — Z89611 Acquired absence of right leg above knee: Secondary | ICD-10-CM | POA: Diagnosis not present

## 2015-03-01 DIAGNOSIS — N189 Chronic kidney disease, unspecified: Secondary | ICD-10-CM | POA: Diagnosis not present

## 2015-03-03 DIAGNOSIS — D649 Anemia, unspecified: Secondary | ICD-10-CM | POA: Diagnosis not present

## 2015-03-03 DIAGNOSIS — Z7901 Long term (current) use of anticoagulants: Secondary | ICD-10-CM | POA: Diagnosis not present

## 2015-03-03 DIAGNOSIS — Z5181 Encounter for therapeutic drug level monitoring: Secondary | ICD-10-CM | POA: Diagnosis not present

## 2015-03-03 DIAGNOSIS — I129 Hypertensive chronic kidney disease with stage 1 through stage 4 chronic kidney disease, or unspecified chronic kidney disease: Secondary | ICD-10-CM | POA: Diagnosis not present

## 2015-03-03 DIAGNOSIS — N189 Chronic kidney disease, unspecified: Secondary | ICD-10-CM | POA: Diagnosis not present

## 2015-03-03 DIAGNOSIS — Z48812 Encounter for surgical aftercare following surgery on the circulatory system: Secondary | ICD-10-CM | POA: Diagnosis not present

## 2015-03-03 DIAGNOSIS — K219 Gastro-esophageal reflux disease without esophagitis: Secondary | ICD-10-CM | POA: Diagnosis not present

## 2015-03-03 DIAGNOSIS — Z89611 Acquired absence of right leg above knee: Secondary | ICD-10-CM | POA: Diagnosis not present

## 2015-03-03 DIAGNOSIS — I739 Peripheral vascular disease, unspecified: Secondary | ICD-10-CM | POA: Diagnosis not present

## 2015-03-03 NOTE — Discharge Summary (Signed)
Dates of Admission and Diagnosis:  Date of Admission 03-Feb-2015   Date of Discharge 10-Feb-2015   Admitting Diagnosis ischemic right leg, Atherosclerosis bilateral lower extremities with rest pain   Final Diagnosis Same    Chief Complaint/History of Present Illness Patient presented with ischemic right leg s/p recent intervention   Chief Complaint/History of Present Illness cont'd The patient The patient is a 69 year old female, extensive history of vascular disease s/p multiple interventions in both lower extremities. She came to the ER early this am with the abrupt onset ov right lower extremity pain and numbness. Last month she had angiography  with runoff right lower extremity and intervention which provided a viable foot.  Unfortunately the course was complicated with left femoral thrombosis which was treated with surgery.  Today she is noting her right foot is cold an painful.  Recent INR was 2.4 but on admission her INR is 1.3.  No trauma, no recent illness.  PAST MEDICAL HISTORY:   1. Dysfunctional left kidney.  2. Kidney stone.  3. Acid reflux.  4. Severe peripheral vascular disease and chronic ischemia, multiple stent in the legs.  5. Tubal pregnancy.   Allergies:  Morphine: N/V/Diarrhea  Hepatic:  20-Apr-16 02:54   Bilirubin, Total 0.3 (0.3-1.2 NOTE: New Reference Range  12/22/14)  Alkaline Phosphatase 109 (38-126 NOTE: New Reference Range  12/22/14)  SGPT (ALT)  10 (14-54 NOTE: New Reference Range  12/22/14)  SGOT (AST) 17 (15-41 NOTE: New Reference Range  12/22/14)  Total Protein, Serum 7.2 (6.5-8.1 NOTE: New Reference Range  12/22/14)  Albumin, Serum 3.9 (3.5-5.0 NOTE: New reference range  12/22/14)  Routine Chem:  20-Apr-16 02:54   Glucose, Serum 92 (65-99 NOTE: New Reference Range  12/22/14)  BUN  26 (6-20 NOTE: New Reference Range  12/22/14)  Creatinine (comp)  1.29 (0.44-1.00 NOTE: New Reference Range  12/22/14)  Sodium, Serum 135  (135-145 NOTE: New Reference Range  12/22/14)  Potassium, Serum  3.3 (3.5-5.1 NOTE: New Reference Range  12/22/14)  Chloride, Serum 103 (101-111 NOTE: New Reference Range  12/22/14)  CO2, Serum 25 (22-32 NOTE: New Reference Range  12/22/14)  Calcium (Total), Serum 9.3 (8.9-10.3 NOTE: New Reference Range  12/22/14)  Anion Gap 7  eGFR (African American)  49  eGFR (Non-African American)  42 (eGFR values <59m/min/1.73 m2 may be an indication of chronic kidney disease (CKD). Calculated eGFR is useful in patients with stable renal function. The eGFR calculation will not be reliable in acutely ill patients when serum creatinine is changing rapidly. It is not useful in patients on dialysis. The eGFR calculation may not be applicable to patients at the low and high extremes of body sizes, pregnant women, and vegetarians.)  23-Apr-16 02:19   Glucose, Serum 99 (65-99 NOTE: New Reference Range  12/22/14)  BUN 10 (6-20 NOTE: New Reference Range  12/22/14)  Creatinine (comp) 0.79 (0.44-1.00 NOTE: New Reference Range  12/22/14)  Sodium, Serum  131 (135-145 NOTE: New Reference Range  12/22/14)  Potassium, Serum 3.7 (3.5-5.1 NOTE: New Reference Range  12/22/14)  Chloride, Serum 102 (101-111 NOTE: New Reference Range  12/22/14)  CO2, Serum 25 (22-32 NOTE: New Reference Range  12/22/14)  Calcium (Total), Serum  7.8 (8.9-10.3 NOTE: New Reference Range  12/22/14)  Anion Gap  4  eGFR (African American) >60  eGFR (Non-African American) >60 (eGFR values <621mmin/1.73 m2 may be an indication of chronic kidney disease (CKD). Calculated eGFR is useful in patients with stable renal function. The eGFR calculation will not  be reliable in acutely ill patients when serum creatinine is changing rapidly. It is not useful in patients on dialysis. The eGFR calculation may not be applicable to patients at the low and high extremes of body sizes, pregnant women, and vegetarians.)  Routine  Coag:  20-Apr-16 02:54   Activated PTT (APTT) 32.3 (A HCT value >55% may artifactually increase the APTT. In one study, the increase was an average of 19%. Reference: "Effect on Routine and Special Coagulation Testing Values of Citrate Anticoagulant Adjustment in Patients with High HCT Values." American Journal of Clinical Pathology 1749;449:675-916.)  Prothrombin  16.2 (11.4-15.0 NOTE: New Reference Range  11/13/14)  INR 1.3 (INR reference interval applies to patients on anticoagulant therapy. A single INR therapeutic range for coumarins is not optimal for all indications; however, the suggested range for most indications is 2.0 - 3.0. Exceptions to the INR Reference Range may include: Prosthetic heart valves, acute myocardial infarction, prevention of myocardial infarction, and combinations of aspirin and anticoagulant. The need for a higher or lower target INR must be assessed individually. Reference: The Pharmacology and Management of the Vitamin K  antagonists: the seventh ACCP Conference on Antithrombotic and Thrombolytic Therapy. BWGYK.5993 Sept:126 (3suppl): [id number]. A HCT value >55% may artifactually increase the PT.  In one study,  the increase was an average of 25%. Reference:  "Effect on Routine and Special Coagulation Testing Values of Citrate Anticoagulant Adjustment in Patients with High HCT Values." American Journal of Clinical Pathology 2006;126:400-405.)  Routine Hem:  20-Apr-16 02:54   WBC (CBC) 8.7  RBC (CBC) 3.80  Hemoglobin (CBC)  11.1  Hematocrit (CBC)  34.1  Platelet Count (CBC) 259  MCV 90  MCH 29.2  MCHC 32.5  RDW  16.3  Neutrophil % 60.9  Lymphocyte % 26.9  Monocyte % 7.3  Eosinophil % 4.1  Basophil % 0.8  Neutrophil # 5.3  Lymphocyte # 2.3  Monocyte # 0.6  Eosinophil # 0.4  Basophil # 0.1 (Result(s) reported on 03 Feb 2015 at 03:26AM.)  23-Apr-16 02:19   WBC (CBC) 8.2  RBC (CBC)  2.11  Hemoglobin (CBC)  6.4  Hematocrit (CBC)  19.1   Platelet Count (CBC) 160  MCV 91  MCH 30.5  MCHC 33.7  RDW  15.7  Neutrophil % 74.9  Lymphocyte % 15.4  Monocyte % 8.2  Eosinophil % 1.2  Basophil % 0.3  Neutrophil # 6.1  Lymphocyte # 1.3  Monocyte # 0.7  Eosinophil # 0.1  Basophil # 0.0 (Result(s) reported on 06 Feb 2015 at 03:26AM.)   PERTINENT RADIOLOGY STUDIES: XRay:    25-Mar-16 15:50, Chest Portable Single View  Chest Portable Single View   REASON FOR EXAM:    check central line placement  COMMENTS:       PROCEDURE: DXR - DXR PORTABLE CHEST SINGLE VIEW  - Jan 08 2015  3:50PM     CLINICAL DATA:  Central line placement    EXAM:  PORTABLE CHEST - 1 VIEW    COMPARISON:  None.    FINDINGS:  Cardiomediastinal silhouette is unremarkable. There is mild  reticular interstitial prominence bilaterally without convincing  pulmonary edema. No segmental infiltrate. No pneumothorax. There is  left IJ central line with tip in distal SVC.     IMPRESSION:  Mild reticular interstitial prominence bilaterally without  convincing pulmonary edema. No segmental infiltrate. No  pneumothorax. Left IJ central line in place.      Electronically Signed    By: Orlean Bradford.D.  On: 01/08/2015 15:52         Verified By: Ephraim Hamburger, M.D.,  LabUnknown:    24-Mar-16 08:27, CT Head Without Contrast  PACS Image     25-Mar-16 15:50, Chest Portable Single View  PACS Image   CT:    24-Mar-16 08:27, CT Head Without Contrast  CT Head Without Contrast   REASON FOR EXAM:    fall, R lat orbit lac. no crepitus  COMMENTS:       PROCEDURE: CT  - CT HEAD WITHOUT CONTRAST  - Jan 07 2015  8:27AM     CLINICAL DATA:  Fall last night, hit right face and head on cabinet    EXAM:  CT HEAD WITHOUT CONTRAST    TECHNIQUE:  Contiguous axial images were obtained from the base of the skull  through the vertex without intravenous contrast.    COMPARISON:  None.  FINDINGS:  No skull fracture is noted. Paranasal sinuses and mastoid air  cells  are unremarkable. No intracranial hemorrhage, mass effect or midline  shift. Moderate cerebral atrophy. No acute cortical infarction.  There is small lacunar infarct in right occipital lobe.    No mass lesion is noted on this unenhanced scan.     IMPRESSION:  No acute intracranial abnormality. Moderate cerebral atrophy. 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 No acute cortical  infarction. Mild periventricular white matter decreased attenuation  probable due to chronic small vessel ischemic changes.    Electronically Signed    By: Lahoma Crocker M.D.    On: 01/07/2015 08:32         Verified By: Ephraim Hamburger, M.D.,   Pertinent Past History:  Pertinent Past History See above   Hospital Course:  Hospital Course On the day of admission she underwent correction of her anticoagulation.  On April 21 the following operation was performed:   1.  Right femoral to right posterior tibial bypass grafting with prosthetic.  2.  Right posterior tibial embolectomy by Fogarty catheter.  3.  Right peroneal embolectomy by Fogarty catheter.  4.  Right common femoral endarterectomy.    Although postoperatively she was hemodynamically stable reevaluation of her foot demonstrated persistence of her ischemia her bypass had thrombosed given that the surgery itself was without difficulties and that the result yielded a palpable pulse below the level of the distal anastomosis I believe that the problem was the intrinsic distal disease and that further interventions and/or attempts at surgery would not yield a salvageable situation. Therefore amputation was discussed with the patient and the family.  On April 22 she underwent right above-knee amputation there were no Complications. Postoperatively she had a normal course and was discharged on postoperative day 5 status post AKA   DISCHARGE INSTRUCTIONS HOME MEDS:  Medication Reconciliation: Patient's Home Medications at Discharge:      Medication Instructions  acetaminophen-oxycodone 325 mg-5 mg oral tablet  1 tab(s) orally every 4 hours, As Needed - for Pain   aspirin 81 mg oral tablet  1 tab(s) orally once a day (in the morning)   famotidine 20 mg oral tablet  1 tab(s) orally once a day (in the morning)   eliquis 5 mg oral tablet  1 tab(s) orally once a day (in the morning)   milk of magnesia 8% oral suspension  30 milliliter(s) orally once a day, As Needed - for Constipation   levofloxacin 250 mg oral tablet  1 tab(s) orally once a day x 6 days  potassium phosphate-sodium phosphate  1 tab(s) orally 2 times a day x 2 days    STOP TAKING THE FOLLOWING MEDICATION(S):    hydrochlorothiazide-lisinopril 12.5 mg-20 mg oral tablet: 1 tab(s) orally once a day coumadin 2.5 mg oral tablet: 1 tab(s) orally once a day  Physician's Instructions:  Home Health? Yes   [place] are in place to close your incision.  Keep this area clean and dry.  Keep the bandage in place unless it becomes loose or soiled.   Diet Regular   Activity Limitations As tolerated   Return to Work Not Applicable   Time frame for Follow Up Appointment 1-2 weeks     Schnier, Gregory(Ordered): [facility] [address], [place], [place] [address], [place] [phone]  Electronic Signatures: Hortencia Pilar (MD)  (Signed 18-May-16 10:46)  Authored: ADMISSION DATE AND DIAGNOSIS, CHIEF COMPLAINT/HPI, Allergies, PERTINENT LABS, PERTINENT RADIOLOGY STUDIES, PERTINENT PAST HISTORY, HOSPITAL COURSE, DISCHARGE INSTRUCTIONS HOME MEDS, PATIENT INSTRUCTIONS, Follow Up Physician   Last Updated: 18-May-16 10:46 by Hortencia Pilar (MD)

## 2015-03-05 DIAGNOSIS — N189 Chronic kidney disease, unspecified: Secondary | ICD-10-CM | POA: Diagnosis not present

## 2015-03-05 DIAGNOSIS — Z5181 Encounter for therapeutic drug level monitoring: Secondary | ICD-10-CM | POA: Diagnosis not present

## 2015-03-05 DIAGNOSIS — Z48812 Encounter for surgical aftercare following surgery on the circulatory system: Secondary | ICD-10-CM | POA: Diagnosis not present

## 2015-03-05 DIAGNOSIS — K219 Gastro-esophageal reflux disease without esophagitis: Secondary | ICD-10-CM | POA: Diagnosis not present

## 2015-03-05 DIAGNOSIS — I739 Peripheral vascular disease, unspecified: Secondary | ICD-10-CM | POA: Diagnosis not present

## 2015-03-05 DIAGNOSIS — D649 Anemia, unspecified: Secondary | ICD-10-CM | POA: Diagnosis not present

## 2015-03-05 DIAGNOSIS — Z89611 Acquired absence of right leg above knee: Secondary | ICD-10-CM | POA: Diagnosis not present

## 2015-03-05 DIAGNOSIS — I129 Hypertensive chronic kidney disease with stage 1 through stage 4 chronic kidney disease, or unspecified chronic kidney disease: Secondary | ICD-10-CM | POA: Diagnosis not present

## 2015-03-05 DIAGNOSIS — Z7901 Long term (current) use of anticoagulants: Secondary | ICD-10-CM | POA: Diagnosis not present

## 2015-03-08 DIAGNOSIS — N189 Chronic kidney disease, unspecified: Secondary | ICD-10-CM | POA: Diagnosis not present

## 2015-03-08 DIAGNOSIS — Z89611 Acquired absence of right leg above knee: Secondary | ICD-10-CM | POA: Diagnosis not present

## 2015-03-08 DIAGNOSIS — Z5181 Encounter for therapeutic drug level monitoring: Secondary | ICD-10-CM | POA: Diagnosis not present

## 2015-03-08 DIAGNOSIS — Z48812 Encounter for surgical aftercare following surgery on the circulatory system: Secondary | ICD-10-CM | POA: Diagnosis not present

## 2015-03-08 DIAGNOSIS — I129 Hypertensive chronic kidney disease with stage 1 through stage 4 chronic kidney disease, or unspecified chronic kidney disease: Secondary | ICD-10-CM | POA: Diagnosis not present

## 2015-03-08 DIAGNOSIS — I739 Peripheral vascular disease, unspecified: Secondary | ICD-10-CM | POA: Diagnosis not present

## 2015-03-08 DIAGNOSIS — D649 Anemia, unspecified: Secondary | ICD-10-CM | POA: Diagnosis not present

## 2015-03-08 DIAGNOSIS — Z7901 Long term (current) use of anticoagulants: Secondary | ICD-10-CM | POA: Diagnosis not present

## 2015-03-08 DIAGNOSIS — K219 Gastro-esophageal reflux disease without esophagitis: Secondary | ICD-10-CM | POA: Diagnosis not present

## 2015-03-09 ENCOUNTER — Other Ambulatory Visit
Admission: RE | Admit: 2015-03-09 | Discharge: 2015-03-09 | Disposition: A | Discharge status: Home / Self Care | Payer: Medicare Other | Source: Ambulatory Visit | Attending: Physician Assistant | Admitting: Physician Assistant

## 2015-03-09 DIAGNOSIS — I10 Essential (primary) hypertension: Secondary | ICD-10-CM | POA: Insufficient documentation

## 2015-03-09 DIAGNOSIS — M810 Age-related osteoporosis without current pathological fracture: Secondary | ICD-10-CM | POA: Insufficient documentation

## 2015-03-09 LAB — COMPREHENSIVE METABOLIC PANEL
ALT: 10 U/L — ABNORMAL LOW (ref 14–54)
AST: 17 U/L (ref 15–41)
Albumin: 4 g/dL (ref 3.5–5.0)
Alkaline Phosphatase: 108 U/L (ref 38–126)
Anion gap: 10 (ref 5–15)
BUN: 25 mg/dL — ABNORMAL HIGH (ref 6–20)
CO2: 26 mmol/L (ref 22–32)
Calcium: 9.1 mg/dL (ref 8.9–10.3)
Chloride: 98 mmol/L — ABNORMAL LOW (ref 101–111)
Creatinine, Ser: 1.08 mg/dL — ABNORMAL HIGH (ref 0.44–1.00)
GFR calc Af Amer: 59 mL/min — ABNORMAL LOW (ref >60)
GFR calc non Af Amer: 51 mL/min — ABNORMAL LOW (ref >60)
Glucose, Bld: 92 mg/dL (ref 65–99)
Potassium: 4.1 mmol/L (ref 3.5–5.1)
Sodium: 134 mmol/L — ABNORMAL LOW (ref 135–145)
Total Bilirubin: 0.2 mg/dL — ABNORMAL LOW (ref 0.3–1.2)
Total Protein: 7.6 g/dL (ref 6.5–8.1)

## 2015-03-09 LAB — CBC WITH DIFFERENTIAL/PLATELET
Basophils Absolute: 0.1 10*3/uL (ref 0–0.1)
Basophils Relative: 1 %
Eosinophils Absolute: 0.2 10*3/uL (ref 0–0.7)
Eosinophils Relative: 3 %
HCT: 33.1 % — ABNORMAL LOW (ref 35.0–47.0)
Hemoglobin: 10.7 g/dL — ABNORMAL LOW (ref 12.0–16.0)
Lymphocytes Relative: 25 %
Lymphs Abs: 1.7 10*3/uL (ref 1.0–3.6)
MCH: 28.2 pg (ref 26.0–34.0)
MCHC: 32.3 g/dL (ref 32.0–36.0)
MCV: 87.4 fL (ref 80.0–100.0)
Monocytes Absolute: 0.4 10*3/uL (ref 0.2–0.9)
Monocytes Relative: 6 %
Neutro Abs: 4.4 10*3/uL (ref 1.4–6.5)
Neutrophils Relative %: 65 %
Platelets: 309 10*3/uL (ref 150–440)
RBC: 3.78 MIL/uL — ABNORMAL LOW (ref 3.80–5.20)
RDW: 15.2 % — ABNORMAL HIGH (ref 11.5–14.5)
WBC: 6.8 10*3/uL (ref 3.6–11.0)

## 2015-03-09 LAB — LIPID PANEL
Cholesterol: 225 mg/dL — ABNORMAL HIGH (ref 0–200)
HDL: 53 mg/dL (ref >40)
LDL Cholesterol: 144 mg/dL — ABNORMAL HIGH (ref 0–99)
Total CHOL/HDL Ratio: 4.2 RATIO
Triglycerides: 141 mg/dL (ref <150)
VLDL: 28 mg/dL (ref 0–40)

## 2015-03-09 LAB — TSH: TSH: 0.665 u[IU]/mL (ref 0.350–4.500)

## 2015-03-10 DIAGNOSIS — N189 Chronic kidney disease, unspecified: Secondary | ICD-10-CM | POA: Diagnosis not present

## 2015-03-10 DIAGNOSIS — I129 Hypertensive chronic kidney disease with stage 1 through stage 4 chronic kidney disease, or unspecified chronic kidney disease: Secondary | ICD-10-CM | POA: Diagnosis not present

## 2015-03-10 DIAGNOSIS — K219 Gastro-esophageal reflux disease without esophagitis: Secondary | ICD-10-CM | POA: Diagnosis not present

## 2015-03-10 DIAGNOSIS — Z48812 Encounter for surgical aftercare following surgery on the circulatory system: Secondary | ICD-10-CM | POA: Diagnosis not present

## 2015-03-10 DIAGNOSIS — I739 Peripheral vascular disease, unspecified: Secondary | ICD-10-CM | POA: Diagnosis not present

## 2015-03-10 DIAGNOSIS — Z89611 Acquired absence of right leg above knee: Secondary | ICD-10-CM | POA: Diagnosis not present

## 2015-03-10 DIAGNOSIS — D649 Anemia, unspecified: Secondary | ICD-10-CM | POA: Diagnosis not present

## 2015-03-10 DIAGNOSIS — Z7901 Long term (current) use of anticoagulants: Secondary | ICD-10-CM | POA: Diagnosis not present

## 2015-03-10 DIAGNOSIS — Z5181 Encounter for therapeutic drug level monitoring: Secondary | ICD-10-CM | POA: Diagnosis not present

## 2015-03-10 LAB — T4: T4, Total: 7.3 ug/dL (ref 4.5–12.0)

## 2015-05-19 DIAGNOSIS — I70222 Atherosclerosis of native arteries of extremities with rest pain, left leg: Secondary | ICD-10-CM | POA: Diagnosis not present

## 2015-05-19 DIAGNOSIS — I70211 Atherosclerosis of native arteries of extremities with intermittent claudication, right leg: Secondary | ICD-10-CM | POA: Diagnosis not present

## 2015-05-20 DIAGNOSIS — S88912A Complete traumatic amputation of left lower leg, level unspecified, initial encounter: Secondary | ICD-10-CM | POA: Diagnosis not present

## 2015-05-20 DIAGNOSIS — I70211 Atherosclerosis of native arteries of extremities with intermittent claudication, right leg: Secondary | ICD-10-CM | POA: Diagnosis not present

## 2015-05-27 DIAGNOSIS — I824Y9 Acute embolism and thrombosis of unspecified deep veins of unspecified proximal lower extremity: Secondary | ICD-10-CM | POA: Diagnosis not present

## 2015-05-27 DIAGNOSIS — I70211 Atherosclerosis of native arteries of extremities with intermittent claudication, right leg: Secondary | ICD-10-CM | POA: Diagnosis not present

## 2015-05-31 DIAGNOSIS — S78111A Complete traumatic amputation at level between right hip and knee, initial encounter: Secondary | ICD-10-CM | POA: Diagnosis not present

## 2015-06-07 ENCOUNTER — Ambulatory Visit: Payer: Medicare Other | Attending: Vascular Surgery | Admitting: Physical Therapy

## 2015-06-07 DIAGNOSIS — R269 Unspecified abnormalities of gait and mobility: Secondary | ICD-10-CM

## 2015-06-07 DIAGNOSIS — Z5189 Encounter for other specified aftercare: Secondary | ICD-10-CM | POA: Insufficient documentation

## 2015-06-07 DIAGNOSIS — R2681 Unsteadiness on feet: Secondary | ICD-10-CM | POA: Insufficient documentation

## 2015-06-07 DIAGNOSIS — Z89611 Acquired absence of right leg above knee: Secondary | ICD-10-CM | POA: Diagnosis not present

## 2015-06-07 DIAGNOSIS — R531 Weakness: Secondary | ICD-10-CM

## 2015-06-07 DIAGNOSIS — R29818 Other symptoms and signs involving the nervous system: Secondary | ICD-10-CM | POA: Insufficient documentation

## 2015-06-07 DIAGNOSIS — R2689 Other abnormalities of gait and mobility: Secondary | ICD-10-CM

## 2015-06-07 DIAGNOSIS — R6889 Other general symptoms and signs: Secondary | ICD-10-CM | POA: Diagnosis not present

## 2015-06-07 NOTE — Therapy (Signed)
[facility] [address] [place], [place], [address] Phone: [phone]   Fax:  [phone]  Physical Therapy Evaluation  Patient Details  Name: [name] MRN: [id number] Date of Birth: 1946/02/12 Referring Provider:  Renford Dills, MD  Encounter Date: 06/07/2015      PT End of Session - 06/07/15 1300    Visit Number 1   Number of Visits 18   Date for PT Re-Evaluation 08/06/15   Authorization Type Medicare G-code every 10th visit   PT Start Time 1300   PT Stop Time 1400   PT Time Calculation (min) 60 min   Equipment Utilized During Treatment Gait belt   Activity Tolerance Patient tolerated treatment well   Behavior During Therapy [facility] for tasks assessed/performed      No past medical history on file.  No past surgical history on file.  There were no vitals filed for this visit.  Visit Diagnosis:  Abnormality of gait  Unsteadiness  Balance problems  Decreased functional activity tolerance  Functional weakness  Status post above knee amputation of right lower extremity      Subjective Assessment - 06/07/15 1309    Subjective This 69yo female underwent a right Transfemoral Amputation on 02/05/2015 due to PAD complications. She recieved first prosthesis 05/31/2015. She presents for PT evaluation.   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 No/denies            [facility] PT Assessment - 06/07/15 1300    Assessment   Medical Diagnosis RIght Transfemoral Amputation   Onset Date/Surgical Date 05/31/15   Precautions   Precautions Fall   Restrictions   Weight Bearing Restrictions No   Balance Screen   Has the patient fallen in the past 6 months No   Has the patient had a decrease in activity level because of a fear of falling?  Yes   Is the  patient reluctant to leave their home because of a fear of falling?  Yes   Home Environment   Living Environment Private residence   Living Arrangements Spouse/significant other   Type of Home House   Home Access Ramped entrance  2nd entrance 2 steps no rails with grass walk to that entran   Home Layout One level   Home Equipment Walker - 4 wheels;Cane - single point;Bedside commode;Shower seat;Grab bars - tub/shower;Wheelchair - manual   Prior Function   Level of Independence Independent;Independent with household mobility without device;Independent with community mobility without device;Independent with gait   Vocation Part time employment  15-20 hrs with 5-6 hr shifts   Vocation Requirements standing, some lifting & carrying up to 20#   Leisure go to car races   Observation/Other Assessments   Focus on Therapeutic Outcomes (FOTO)  33  Functional Status   Fear Avoidance Belief Questionnaire (FABQ)  26   Posture/Postural Control   Posture/Postural Control Postural limitations   Postural Limitations Rounded Shoulders;Forward head;Flexed trunk;Weight shift left  prosthesis abducted   ROM / Strength   AROM / PROM / Strength AROM;Strength   AROM   Overall AROM  Within functional limits for tasks performed   Overall AROM Comments Right hip flexors & left hamstrings appear tight   Strength   Overall Strength Within functional limits for tasks performed   Overall Strength Comments tested BUEs &  BLEs in sitting, grossly 5/5   Transfers   Transfers Sit to Stand;Stand to Sit   Sit to Stand 5: Supervision;With upper extremity assist;With armrests;From chair/3-in-1  cues on prosthesis control   Sit to Stand Details (indicate cue type and reason) unsteady initial 5 seconds but did not require touch to RW   Stand to Sit 5: Supervision;With upper extremity assist;With armrests;To chair/3-in-1   Ambulation/Gait   Ambulation/Gait Yes   Ambulation/Gait Assistance 4: Min assist;4: Min guard    Ambulation/Gait Assistance Details PT instructed in prosthetic knee control, wt shift & general technique   Ambulation Distance (Feet) 140 Feet   Assistive device Prosthesis;Rolling walker   Gait Pattern Step-through pattern;Decreased step length - left;Decreased stance time - right;Decreased stride length;Decreased hip/knee flexion - right;Decreased weight shift to right;Right hip hike;Trunk flexed;Abducted- right   Ambulation Surface Indoor;Level   Gait velocity 0.46 ft/sec   Standardized Balance Assessment   Standardized Balance Assessment Berg Balance Test   Berg Balance Test   Sit to Stand Able to stand using hands after several tries   Standing Unsupported Needs several tries to stand 30 seconds unsupported   Sitting with Back Unsupported but Feet Supported on Floor or Stool Able to sit safely and securely 2 minutes   Stand to Sit Sits independently, has uncontrolled descent   Transfers Able to transfer safely, minor use of hands   Standing Unsupported with Eyes Closed Unable to keep eyes closed 3 seconds but stays steady   Standing Ubsupported with Feet Together Needs help to attain position but able to stand for 30 seconds with feet together   From Standing, Reach Forward with Outstretched Arm Reaches forward but needs supervision   From Standing Position, Pick up Object from Floor Unable to pick up shoe, but reaches 2-5 cm (1-2") from shoe and balances independently   From Standing Position, Turn to Look Behind Over each Shoulder Needs supervision when turning   Turn 360 Degrees Needs assistance while turning   Standing Unsupported, Alternately Place Feet on Step/Stool Needs assistance to keep from falling or unable to try   Standing Unsupported, One Foot in [employer] balance while stepping or standing   Standing on One Leg Tries to lift leg/unable to hold 3 seconds but remains standing independently   Total Score 19         Prosthetics Assessment - 06/07/15 1300     Prosthetics   Prosthetic Care Dependent with Skin check;Residual limb care;Prosthetic cleaning;Correct ply sock adjustment;Proper wear schedule/adjustment;Proper weight-bearing schedule/adjustment;Other (comment)  donning   Donning prosthesis  Min assist   Doffing prosthesis  Supervision   Current prosthetic wear tolerance (days/week)  Patient reports wear 6 of 7 days since delivery   Current prosthetic wear tolerance (#hours/day)  reports wear 30-60 minutes 1x/day   Residual limb condition  no open areas, normal color, temperature & moisture   K code/activity level with prosthetic use  3                  [facility] Adult PT Treatment/Exercise - 06/07/15 1300    Prosthetics   Education Provided Skin check;Residual limb care;Prosthetic cleaning;Correct ply sock adjustment;Proper Donning;Proper wear schedule/adjustment;Proper weight-bearing schedule/adjustment;Other (comment)  sitting posture/position with prosthesis, toileting   Person(s) Educated Patient;Spouse   Education Method Explanation;Demonstration;Verbal cues   Education Method Verbalized understanding;Returned demonstration;Tactile cues required;Verbal cues required;Needs further instruction                  PT Short Term Goals -  06/07/15 1300    PT SHORT TERM GOAL #1   Title Patient tolerates wear >10 hrs /day without skin integrity issues or pain in residual limb ( Target Date: 07/07/2015)   Time 1   Period Months   Status New   PT SHORT TERM GOAL #2   Title Patient donnes prosthesis independently & correctly. ( Target Date: 07/07/2015)   Time 1   Period Months   Status New   PT SHORT TERM GOAL #3   Title Patient ambulates 300' with walker or crutches with prosthesis with supervision. ( Target Date: 07/07/2015)   Time 1   Period Months   Status New   PT SHORT TERM GOAL #4   Title Patient negotiates stairs (1 rail), ramps & curbs with walker or crutches with supervision. ( Target Date: 07/07/2015)   Time 1    Period Months   Status New   PT SHORT TERM GOAL #5   Title patient reaches 7" and to floor without UE support with supervision safely. ( Target Date: 07/07/2015)   Time 1   Period Months   Status New           PT Long Term Goals - 06/07/15 1300    PT LONG TERM GOAL #1   Title patient tolerates prosthesis wear >90% of awake hours without skin integrity issues or limb discomfort to enable function throughout her day. ( Target Date: 08/06/2015)   Time 2   Period Months   Status New   PT LONG TERM GOAL #2   Title Patient verbalizes / demonstrates proper prosthetic care to enable safe use of prosthesis. ( Target Date: 08/06/2015)   Time 2   Period Months   Status New   PT LONG TERM GOAL #3   Title Patient ambulates household distance of 61' with cane or less with prosthesis carrying light weight object modified independent. ( Target Date: 08/06/2015)   Time 2   Period Months   Status New   PT LONG TERM GOAL #4   Title patient ambulates 400' with LRAD & prosthesis modified independent to enable community access. ( Target Date: 08/06/2015)   Time 2   Period Months   Status New   PT LONG TERM GOAL #5   Title Patient negotiates ramps, curbs & stairs with LRAD & prosthesis modified independent to enable community access. ( Target Date: 08/06/2015)   Time 2   Period Months   Status New   Additional Long Term Goals   Additional Long Term Goals Yes   PT LONG TERM GOAL #6   Title Berg Balance Test >36/56 to reduce fall risk. ( Target Date: 08/06/2015)   Time 2   Status New   PT LONG TERM GOAL #7   Title Patient self reports via FOTO a functional status of >45 ( Target Date: 08/06/2015)   Time 2   Period Months   Status New               Plan - 06/07/15 1300    Clinical Impression Statement This 69yo female recieved her first prosthesis one week ago and is dependent in care & use. She has limited wear which limits function and needs skilled instruction to progress  without complications. Patient has Berg Balance test 19/56 indicating high fall risk. Her gait is dependent with little knowledge of prosthesis use with risk of falls.    Pt will benefit from skilled therapeutic intervention in order to improve on the following deficits Abnormal  gait;Decreased activity tolerance;Decreased balance;Decreased endurance;Decreased knowledge of use of DME;Decreased mobility;Decreased range of motion;Impaired flexibility;Postural dysfunction;Prosthetic Dependency   Rehab Potential Good   PT Frequency 2x / week   PT Duration Other (comment)  9 weeks (60 days)   PT Treatment/Interventions ADLs/Self Care Home Management;Gait training;Stair training;DME Instruction;Functional mobility training;Therapeutic activities;Therapeutic exercise;Balance training;Neuromuscular re-education;Patient/family education;Prosthetic Training   PT Next Visit Plan Review prosthetic care, instruct in stairs, ramps, curbs with RW, assess gait with crutches (if pt has axillary use them otherwise forearm)   PT Home Exercise Plan midline standing HEP   Consulted and Agree with Plan of Care Patient;Family member/caregiver   Family Member Consulted husband          G-Codes - Jun 17, 2015 1300    Functional Assessment Tool Used dependent in prosthetic care, pt has worn prosthesis 6 of 7 days since delivery for 30-60 minutes.   Functional Limitation Self care   Self Care Current Status [phone]) At least 80 percent but less than 100 percent impaired, limited or restricted   Self Care Goal Status (U0454) At least 1 percent but less than 20 percent impaired, limited or restricted       Problem List Patient Active Problem List   Diagnosis Date Noted  . Pneumonia 02/13/2015    Vladimir Faster PT, DPT 06/17/15, 5:46 PM  [facility] [address] [place], [place], [address] Phone: [phone]   Fax:  [phone]

## 2015-06-08 ENCOUNTER — Encounter: Payer: Self-pay | Admitting: Physical Therapy

## 2015-06-08 ENCOUNTER — Ambulatory Visit: Payer: Medicare Other | Admitting: Physical Therapy

## 2015-06-08 DIAGNOSIS — R2681 Unsteadiness on feet: Secondary | ICD-10-CM

## 2015-06-08 DIAGNOSIS — R269 Unspecified abnormalities of gait and mobility: Secondary | ICD-10-CM | POA: Diagnosis not present

## 2015-06-08 DIAGNOSIS — R531 Weakness: Secondary | ICD-10-CM | POA: Diagnosis not present

## 2015-06-08 DIAGNOSIS — R29818 Other symptoms and signs involving the nervous system: Secondary | ICD-10-CM | POA: Diagnosis not present

## 2015-06-08 DIAGNOSIS — Z5189 Encounter for other specified aftercare: Secondary | ICD-10-CM | POA: Diagnosis not present

## 2015-06-08 DIAGNOSIS — Z89611 Acquired absence of right leg above knee: Secondary | ICD-10-CM | POA: Diagnosis not present

## 2015-06-08 DIAGNOSIS — R6889 Other general symptoms and signs: Secondary | ICD-10-CM

## 2015-06-08 DIAGNOSIS — R2689 Other abnormalities of gait and mobility: Secondary | ICD-10-CM

## 2015-06-09 NOTE — Therapy (Signed)
[facility] [address] [place], [place], [address] Phone: [phone]   Fax:  [phone]  Physical Therapy Treatment  Patient Details  Name: [name] MRN: [id number] Date of Birth: Apr 11, 1946 Referring Provider:  Renford Dills, MD  Encounter Date: 06/08/2015      PT End of Session - 06/08/15 1625    Visit Number 2   Number of Visits 18   Date for PT Re-Evaluation 08/06/15   Authorization Type Medicare G-code every 10th visit   PT Start Time 1625   PT Stop Time 1718   PT Time Calculation (min) 53 min   Equipment Utilized During Treatment Gait belt   Activity Tolerance Patient tolerated treatment well   Behavior During Therapy [facility] for tasks assessed/performed      History reviewed. No pertinent past medical history.  History reviewed. No pertinent past surgical history.  There were no vitals filed for this visit.  Visit Diagnosis:  Abnormality of gait  Unsteadiness  Balance problems  Decreased functional activity tolerance  Functional weakness  Status post above knee amputation of right lower extremity      Subjective Assessment - 06/08/15 1625    Subjective Reports that she put 1/4" lift that PT gave her into sound limb shoe and it has decreased pubic ramus pressure   Patient is accompained by: Family member   Currently in Pain? No/denies                         [facility] Adult PT Treatment/Exercise - 06/08/15 1625    Transfers   Sit to Stand 5: Supervision;With upper extremity assist;With armrests;From chair/3-in-1   Stand to Sit 5: Supervision;With upper extremity assist;With armrests;To chair/3-in-1   Ambulation/Gait   Ambulation/Gait Yes   Ambulation/Gait Assistance 4: Min guard;5: Supervision   Ambulation/Gait Assistance Details tactile & verbal cues on wt shift, prosthetic knee control & pelvic motion   Ambulation Distance (Feet) 200 Feet  200' X 2   Assistive device Prosthesis;Rolling walker   Gait Pattern Step-through pattern;Decreased step length - left;Decreased stance time - right;Decreased stride length;Decreased hip/knee flexion - right;Decreased weight shift to right;Right hip hike;Trunk flexed;Abducted- right   Ambulation Surface Indoor;Level   Stairs Yes   Stairs Assistance 5: Supervision   Stairs Assistance Details (indicate cue type and reason) demo / instruction prior & verbal cues during on technique with AKA prosthesisa   Stair Management Technique Two rails;Step to pattern;Forwards   Number of Stairs 4   Ramp 4: Min assist  RW & prosthesis   Ramp Details (indicate cue type and reason) PT demo / instructed technique with prosthesis prior with verbal cues during  PT instructed husband how to safely assist w/ verbalized und   Curb 4: Min assist  RW & prosthesis   Curb Details (indicate cue type and reason) PT demo / instructed technique with prosthesis prior with verbal cues during   Prosthetics   Prosthetic Care Comments  PT checked pubic ramus area with no pressure    Current prosthetic wear tolerance (days/week)  daily   Current prosthetic wear tolerance (#hours/day)  reports wear 2 hrs 2x/day as advised in eval with plan to increase Q 5 days if no issues   Residual limb condition  no open areas, normal color, temperature & moisture   Education Provided Skin check;Residual limb care;Proper Donning;Proper wear schedule/adjustment   Person(s) Educated Patient   Education Method Explanation   Education Method Verbalized  understanding;Verbal cues required;Needs further instruction                PT Education - 06/08/15 1615    Education provided Yes   Education Details amputee standing midline HEP at sink   Person(s) Educated Patient;Spouse   Methods Explanation;Demonstration;Tactile cues;Verbal cues   Comprehension Verbalized understanding;Returned demonstration;Verbal cues required;Tactile cues required;Need  further instruction          PT Short Term Goals - 06/07/15 1300    PT SHORT TERM GOAL #1   Title Patient tolerates wear >10 hrs /day without skin integrity issues or pain in residual limb ( Target Date: 07/07/2015)   Time 1   Period Months   Status New   PT SHORT TERM GOAL #2   Title Patient donnes prosthesis independently & correctly. ( Target Date: 07/07/2015)   Time 1   Period Months   Status New   PT SHORT TERM GOAL #3   Title Patient ambulates 300' with walker or crutches with prosthesis with supervision. ( Target Date: 07/07/2015)   Time 1   Period Months   Status New   PT SHORT TERM GOAL #4   Title Patient negotiates stairs (1 rail), ramps & curbs with walker or crutches with supervision. ( Target Date: 07/07/2015)   Time 1   Period Months   Status New   PT SHORT TERM GOAL #5   Title patient reaches 7" and to floor without UE support with supervision safely. ( Target Date: 07/07/2015)   Time 1   Period Months   Status New           PT Long Term Goals - 06/07/15 1300    PT LONG TERM GOAL #1   Title patient tolerates prosthesis wear >90% of awake hours without skin integrity issues or limb discomfort to enable function throughout her day. ( Target Date: 08/06/2015)   Time 2   Period Months   Status New   PT LONG TERM GOAL #2   Title Patient verbalizes / demonstrates proper prosthetic care to enable safe use of prosthesis. ( Target Date: 08/06/2015)   Time 2   Period Months   Status New   PT LONG TERM GOAL #3   Title Patient ambulates household distance of 106' with cane or less with prosthesis carrying light weight object modified independent. ( Target Date: 08/06/2015)   Time 2   Period Months   Status New   PT LONG TERM GOAL #4   Title patient ambulates 400' with LRAD & prosthesis modified independent to enable community access. ( Target Date: 08/06/2015)   Time 2   Period Months   Status New   PT LONG TERM GOAL #5   Title Patient negotiates ramps, curbs  & stairs with LRAD & prosthesis modified independent to enable community access. ( Target Date: 08/06/2015)   Time 2   Period Months   Status New   Additional Long Term Goals   Additional Long Term Goals Yes   PT LONG TERM GOAL #6   Title Berg Balance Test >36/56 to reduce fall risk. ( Target Date: 08/06/2015)   Time 2   Status New   PT LONG TERM GOAL #7   Title Patient self reports via FOTO a functional status of >45 ( Target Date: 08/06/2015)   Time 2   Period Months   Status New               Plan - 06/08/15 1625  Clinical Impression Statement Patient appears to understand how to negotiate barriers with device with prosthesis to perform with husband assist. Patient understands how to use HEP to improve proprioception of prosthesis.   Pt will benefit from skilled therapeutic intervention in order to improve on the following deficits Abnormal gait;Decreased activity tolerance;Decreased balance;Decreased endurance;Decreased knowledge of use of DME;Decreased mobility;Decreased range of motion;Impaired flexibility;Postural dysfunction;Prosthetic Dependency   Rehab Potential Good   PT Frequency 2x / week   PT Duration Other (comment)  9 weeks (60 days)   PT Treatment/Interventions ADLs/Self Care Home Management;Gait training;Stair training;DME Instruction;Functional mobility training;Therapeutic activities;Therapeutic exercise;Balance training;Neuromuscular re-education;Patient/family education;Prosthetic Training   PT Next Visit Plan Review prosthetic care, review stairs, ramps, curbs with RW, assess gait with crutches (if pt has axillary use them otherwise forearm)   PT Home Exercise Plan review midline standing HEP   Consulted and Agree with Plan of Care Patient;Family member/caregiver   Family Member Consulted husband        Problem List Patient Active Problem List   Diagnosis Date Noted  . Pneumonia 02/13/2015    Vladimir Faster PT, DPT 06/09/2015, 7:54 AM  [facility] [address] [place], [place], [address] Phone: [phone]   Fax:  [phone]

## 2015-06-14 DIAGNOSIS — I70211 Atherosclerosis of native arteries of extremities with intermittent claudication, right leg: Secondary | ICD-10-CM | POA: Diagnosis not present

## 2015-06-14 DIAGNOSIS — I824Y9 Acute embolism and thrombosis of unspecified deep veins of unspecified proximal lower extremity: Secondary | ICD-10-CM | POA: Diagnosis not present

## 2015-06-15 ENCOUNTER — Encounter: Payer: Self-pay | Admitting: Physical Therapy

## 2015-06-15 ENCOUNTER — Ambulatory Visit: Payer: Medicare Other | Admitting: Physical Therapy

## 2015-06-15 DIAGNOSIS — R6889 Other general symptoms and signs: Secondary | ICD-10-CM | POA: Diagnosis not present

## 2015-06-15 DIAGNOSIS — Z89611 Acquired absence of right leg above knee: Secondary | ICD-10-CM

## 2015-06-15 DIAGNOSIS — R29818 Other symptoms and signs involving the nervous system: Secondary | ICD-10-CM | POA: Diagnosis not present

## 2015-06-15 DIAGNOSIS — R269 Unspecified abnormalities of gait and mobility: Secondary | ICD-10-CM

## 2015-06-15 DIAGNOSIS — R2689 Other abnormalities of gait and mobility: Secondary | ICD-10-CM

## 2015-06-15 DIAGNOSIS — R2681 Unsteadiness on feet: Secondary | ICD-10-CM | POA: Diagnosis not present

## 2015-06-15 DIAGNOSIS — R531 Weakness: Secondary | ICD-10-CM

## 2015-06-15 DIAGNOSIS — Z5189 Encounter for other specified aftercare: Secondary | ICD-10-CM | POA: Diagnosis not present

## 2015-06-15 DIAGNOSIS — Z4789 Encounter for other orthopedic aftercare: Secondary | ICD-10-CM

## 2015-06-15 NOTE — Therapy (Signed)
[facility] [address] [place], [place], [address] Phone: [phone]   Fax:  [phone]  Physical Therapy Treatment  Patient Details  Name: [name] MRN: [id number] Date of Birth: 1945-12-02 Referring Provider:  Renford Dills, MD  Encounter Date: 06/15/2015      PT End of Session - 06/15/15 1400    Visit Number 3   Number of Visits 18   Date for PT Re-Evaluation 08/06/15   Authorization Type Medicare G-code every 10th visit   PT Start Time 1400   PT Stop Time 1445   PT Time Calculation (min) 45 min   Equipment Utilized During Treatment Gait belt   Activity Tolerance Patient tolerated treatment well   Behavior During Therapy [facility] for tasks assessed/performed      History reviewed. No pertinent past medical history.  History reviewed. No pertinent past surgical history.  There were no vitals filed for this visit.  Visit Diagnosis:  Abnormality of gait  Unsteadiness  Balance problems  Decreased functional activity tolerance  Functional weakness  Status post above knee amputation of right lower extremity  Encounter for prosthetic gait training      Subjective Assessment - 06/15/15 1411    Subjective Her INR is 3.6 this morning. She is a little light headed. Denies falls. Wearing prosthesis 2 hrs 2x/[name] with no issues.    Currently in Pain? No/denies                         [facility] Adult PT Treatment/Exercise - 06/15/15 1400    Transfers   Sit to Stand 5: Supervision;With upper extremity assist;With armrests;From chair/3-in-1   Sit to Stand Details Visual cues/gestures for sequencing;Visual cues for safe use of DME/AE;Verbal cues for sequencing   Sit to Stand Details (indicate cue type and reason) PT demo how to stand up with crutches & prosthesis   Stand to Sit 5: Supervision;With armrests;With upper extremity assist;To chair/3-in-1   Stand to Sit Details (indicate  cue type and reason) Visual cues for safe use of DME/AE;Visual cues/gestures for sequencing;Verbal cues for technique;Verbal cues for sequencing   Stand to Sit Details PT demo cued on use of crutches & prosthesis   Ambulation/Gait   Ambulation/Gait Yes   Ambulation/Gait Assistance 4: Min assist;4: Min guard   Ambulation/Gait Assistance Details PT demo, instructed prior in 3 pt pattern with axillary crutches, verbal cues during   Ambulation Distance (Feet) 65 Feet  65' X 2 with crutches, 100' X 2 with rolling walker   Assistive device Prosthesis;Rolling walker;Crutches   Gait Pattern Step-through pattern;Decreased step length - left;Decreased stance time - right;Decreased stride length;Decreased hip/knee flexion - right;Decreased weight shift to right;Right hip hike;Trunk flexed;Abducted- right   Ambulation Surface Indoor;Level   Stairs Yes   Stairs Assistance 4: Min guard   Stairs Assistance Details (indicate cue type and reason) PT demo, cued in technique with 1 crutch & 1 rail, with varying rail location   Stair Management Technique One rail Right;With crutches;Step to pattern;Forwards   Number of Stairs 4   Ramp --   Curb --   Prosthetics   Prosthetic Care Comments  PT instructed to limit WB if she notices bruising with high INR    Current prosthetic wear tolerance (days/week)  daily   Current prosthetic wear tolerance (#hours/[name])  wearing 2 hrs 2x/[name], if no bruising with INR high increase to 3 hrs 2x/[name]   Residual limb condition  --  Education Provided Skin check;Residual limb care;Proper wear schedule/adjustment   Person(s) Educated Patient;Spouse   Education Method Explanation;Verbal cues   Education Method Verbalized understanding;Needs further instruction;Verbal cues required                  PT Short Term Goals - 06/07/15 1300    PT SHORT TERM GOAL #1   Title Patient tolerates wear >10 hrs /[name] without skin integrity issues or pain in residual limb ( Target  Date: 07/07/2015)   Time 1   Period Months   Status New   PT SHORT TERM GOAL #2   Title Patient donnes prosthesis independently & correctly. ( Target Date: 07/07/2015)   Time 1   Period Months   Status New   PT SHORT TERM GOAL #3   Title Patient ambulates 300' with walker or crutches with prosthesis with supervision. ( Target Date: 07/07/2015)   Time 1   Period Months   Status New   PT SHORT TERM GOAL #4   Title Patient negotiates stairs (1 rail), ramps & curbs with walker or crutches with supervision. ( Target Date: 07/07/2015)   Time 1   Period Months   Status New   PT SHORT TERM GOAL #5   Title patient reaches 7" and to floor without UE support with supervision safely. ( Target Date: 07/07/2015)   Time 1   Period Months   Status New           PT Long Term Goals - 06/07/15 1300    PT LONG TERM GOAL #1   Title patient tolerates prosthesis wear >90% of awake hours without skin integrity issues or limb discomfort to enable function throughout her [name]. ( Target Date: 08/06/2015)   Time 2   Period Months   Status New   PT LONG TERM GOAL #2   Title Patient verbalizes / demonstrates proper prosthetic care to enable safe use of prosthesis. ( Target Date: 08/06/2015)   Time 2   Period Months   Status New   PT LONG TERM GOAL #3   Title Patient ambulates household distance of 29' with cane or less with prosthesis carrying light weight object modified independent. ( Target Date: 08/06/2015)   Time 2   Period Months   Status New   PT LONG TERM GOAL #4   Title patient ambulates 400' with LRAD & prosthesis modified independent to enable community access. ( Target Date: 08/06/2015)   Time 2   Period Months   Status New   PT LONG TERM GOAL #5   Title Patient negotiates ramps, curbs & stairs with LRAD & prosthesis modified independent to enable community access. ( Target Date: 08/06/2015)   Time 2   Period Months   Status New   Additional Long Term Goals   Additional Long Term  Goals Yes   PT LONG TERM GOAL #6   Title Berg Balance Test >36/56 to reduce fall risk. ( Target Date: 08/06/2015)   Time 2   Status New   PT LONG TERM GOAL #7   Title Patient self reports via FOTO a functional status of >45 ( Target Date: 08/06/2015)   Time 2   Period Months   Status New               Plan - 06/15/15 1400    Clinical Impression Statement Patient appears can ambulate with crutches safely with further training. She needs to limit gait until INR is lower due to high risk of  falls could result in greater injuries and prosthesis pressure can cause bruising of limb.   Pt will benefit from skilled therapeutic intervention in order to improve on the following deficits Abnormal gait;Decreased activity tolerance;Decreased balance;Decreased endurance;Decreased knowledge of use of DME;Decreased mobility;Decreased range of motion;Impaired flexibility;Postural dysfunction;Prosthetic Dependency   Rehab Potential Good   PT Frequency 2x / week   PT Duration Other (comment)  9 weeks (60 days)   PT Treatment/Interventions ADLs/Self Care Home Management;Gait training;Stair training;DME Instruction;Functional mobility training;Therapeutic activities;Therapeutic exercise;Balance training;Neuromuscular re-education;Patient/family education;Prosthetic Training   PT Next Visit Plan Review prosthetic care, review stairs, ramps, curbs with RW, gait with crutches (if pt has axillary use them otherwise forearm)   PT Home Exercise Plan --   Consulted and Agree with Plan of Care Patient;Family member/caregiver   Family Member Consulted husband        Problem List Patient Active Problem List   Diagnosis Date Noted  . Pneumonia 02/13/2015    Vladimir Faster PT, DPT 06/15/2015, 8:37 PM  [facility] [address] [place], [place], [address] Phone: [phone]   Fax:  [phone]

## 2015-06-23 ENCOUNTER — Ambulatory Visit: Payer: Medicare Other | Attending: Vascular Surgery | Admitting: Physical Therapy

## 2015-06-23 ENCOUNTER — Encounter: Payer: Self-pay | Admitting: Physical Therapy

## 2015-06-23 DIAGNOSIS — R29818 Other symptoms and signs involving the nervous system: Secondary | ICD-10-CM | POA: Diagnosis not present

## 2015-06-23 DIAGNOSIS — Z5189 Encounter for other specified aftercare: Secondary | ICD-10-CM | POA: Insufficient documentation

## 2015-06-23 DIAGNOSIS — R6889 Other general symptoms and signs: Secondary | ICD-10-CM

## 2015-06-23 DIAGNOSIS — R269 Unspecified abnormalities of gait and mobility: Secondary | ICD-10-CM | POA: Diagnosis not present

## 2015-06-23 DIAGNOSIS — Z89611 Acquired absence of right leg above knee: Secondary | ICD-10-CM | POA: Insufficient documentation

## 2015-06-23 DIAGNOSIS — R531 Weakness: Secondary | ICD-10-CM

## 2015-06-23 DIAGNOSIS — R2681 Unsteadiness on feet: Secondary | ICD-10-CM | POA: Diagnosis not present

## 2015-06-23 DIAGNOSIS — R2689 Other abnormalities of gait and mobility: Secondary | ICD-10-CM

## 2015-06-24 ENCOUNTER — Ambulatory Visit: Payer: Medicare Other | Admitting: Physical Therapy

## 2015-06-24 DIAGNOSIS — I70211 Atherosclerosis of native arteries of extremities with intermittent claudication, right leg: Secondary | ICD-10-CM | POA: Diagnosis not present

## 2015-06-24 DIAGNOSIS — I824Z9 Acute embolism and thrombosis of unspecified deep veins of unspecified distal lower extremity: Secondary | ICD-10-CM | POA: Diagnosis not present

## 2015-06-27 NOTE — Therapy (Signed)
[facility] [address] [place], [place], [address] Phone: [phone]   Fax:  [phone]  Physical Therapy Treatment  Patient Details  Name: [name] MRN: [id number] Date of Birth: 01-19-1946 Referring Provider:  Renford Dills, MD  Encounter Date: 06/23/2015   06/23/15 1240  PT Visits / Re-Eval  Visit Number 4  Number of Visits 18  Date for PT Re-Evaluation 08/06/15  Authorization  Authorization Type Medicare G-code every 10th visit  PT Time Calculation  PT Start Time 1233  PT Stop Time 1315  PT Time Calculation (min) 42 min  PT - End of Session  Equipment Utilized During Treatment Gait belt  Activity Tolerance Patient tolerated treatment well  Behavior During Therapy [facility] for tasks assessed/performed     History reviewed. No pertinent past medical history.  History reviewed. No pertinent past surgical history.  There were no vitals filed for this visit.  Visit Diagnosis:  Abnormality of gait  Unsteadiness  Balance problems  Decreased functional activity tolerance  Functional weakness     06/23/15 1239  Symptoms/Limitations  Subjective No new complaints. No falls. Did not wear her leg much over the weekend due to being on vacation at [facility].   Pain Assessment  Currently in Pain? No/denies      06/23/15 1241  Transfers  Sit to Stand 5: Supervision;With upper extremity assist;With armrests;From chair/3-in-1  Sit to Stand Details Verbal cues for technique;Verbal cues for precautions/safety  Stand to Sit 5: Supervision;With armrests;With upper extremity assist;To chair/3-in-1  Stand to Sit Details (indicate cue type and reason) Verbal cues for precautions/safety;Verbal cues for technique  Ambulation/Gait  Ambulation/Gait Yes  Ambulation/Gait Assistance 4: Min guard  Ambulation/Gait Assistance Details cues on posture and to correct gait deviations  Ambulation Distance (Feet) 220  Feet (x1, 60 x1, 80 x1)  Assistive device Rollator;Prosthesis  Gait Pattern Step-through pattern;Decreased step length - left;Decreased stance time - right;Decreased stride length;Decreased weight shift to right;Trunk flexed  Ambulation Surface Level;Indoor  Stairs Yes  Stairs Assistance 4: Min guard  Stairs Assistance Details (indicate cue type and reason) cues on sequence, hand advancement and technique with advancing prosthesis  Stair Management Technique One rail Right;One rail Left;Step to pattern;Forwards  Number of Stairs 4 (x 2 reps)  Ramp Other (comment) (min guard assist with rollator/prosthesis)  Ramp Details (indicate cue type and reason) cues on tecnique/sequence  Curb Other (comment) (min guard assist with rollator/prosthesis)  Curb Details (indicate cue type and reason) cues on foot placement while advancing rollator  Prosthetics  Prosthetic Care Comments  did have some bruising from her INR, however it's cleared up. Get's her INR checked again in the am.   Current prosthetic wear tolerance (days/week)  daily  Current prosthetic wear tolerance (#hours/day)  2 hours in am, 3 hours in pm.  Residual limb condition  intact per pt.  Education Provided Residual limb care;Proper Donning;Proper Doffing;Proper wear schedule/adjustment  Person(s) Educated Patient  Education Method Explanation;Verbal cues  Education Method Verbalized understanding  Donning Prosthesis 5  Doffing Prosthesis 5           PT Short Term Goals - 06/07/15 1300    PT SHORT TERM GOAL #1   Title Patient tolerates wear >10 hrs /day without skin integrity issues or pain in residual limb ( Target Date: 07/07/2015)   Time 1   Period Months   Status New   PT SHORT TERM GOAL #2   Title Patient donnes prosthesis independently & correctly. (  Target Date: 07/07/2015)   Time 1   Period Months   Status New   PT SHORT TERM GOAL #3   Title Patient ambulates 300' with walker or crutches with prosthesis with  supervision. ( Target Date: 07/07/2015)   Time 1   Period Months   Status New   PT SHORT TERM GOAL #4   Title Patient negotiates stairs (1 rail), ramps & curbs with walker or crutches with supervision. ( Target Date: 07/07/2015)   Time 1   Period Months   Status New   PT SHORT TERM GOAL #5   Title patient reaches 7" and to floor without UE support with supervision safely. ( Target Date: 07/07/2015)   Time 1   Period Months   Status New           PT Long Term Goals - 06/07/15 1300    PT LONG TERM GOAL #1   Title patient tolerates prosthesis wear >90% of awake hours without skin integrity issues or limb discomfort to enable function throughout her day. ( Target Date: 08/06/2015)   Time 2   Period Months   Status New   PT LONG TERM GOAL #2   Title Patient verbalizes / demonstrates proper prosthetic care to enable safe use of prosthesis. ( Target Date: 08/06/2015)   Time 2   Period Months   Status New   PT LONG TERM GOAL #3   Title Patient ambulates household distance of 1' with cane or less with prosthesis carrying light weight object modified independent. ( Target Date: 08/06/2015)   Time 2   Period Months   Status New   PT LONG TERM GOAL #4   Title patient ambulates 400' with LRAD & prosthesis modified independent to enable community access. ( Target Date: 08/06/2015)   Time 2   Period Months   Status New   PT LONG TERM GOAL #5   Title Patient negotiates ramps, curbs & stairs with LRAD & prosthesis modified independent to enable community access. ( Target Date: 08/06/2015)   Time 2   Period Months   Status New   Additional Long Term Goals   Additional Long Term Goals Yes   PT LONG TERM GOAL #6   Title Berg Balance Test >36/56 to reduce fall risk. ( Target Date: 08/06/2015)   Time 2   Status New   PT LONG TERM GOAL #7   Title Patient self reports via FOTO a functional status of >45 ( Target Date: 08/06/2015)   Time 2   Period Months   Status New         06/23/15 1240  Plan  Clinical Impression Statement Pt making steady progress toward goals.   Pt will benefit from skilled therapeutic intervention in order to improve on the following deficits Abnormal gait;Decreased activity tolerance;Decreased balance;Decreased endurance;Decreased knowledge of use of DME;Decreased mobility;Decreased range of motion;Impaired flexibility;Postural dysfunction;Prosthetic Dependency  Rehab Potential Good  PT Frequency 2x / week  PT Duration Other (comment) (9 weeks (60 days))  PT Treatment/Interventions ADLs/Self Care Home Management;Gait training;Stair training;DME Instruction;Functional mobility training;Therapeutic activities;Therapeutic exercise;Balance training;Neuromuscular re-education;Patient/family education;Prosthetic Training  PT Next Visit Plan Review prosthetic care, review stairs, ramps, curbs with RW, gait with crutches (if pt has axillary use them otherwise forearm)  Consulted and Agree with Plan of Care Patient;Family member/caregiver  Family Member Consulted husband     Problem List Patient Active Problem List   Diagnosis Date Noted  . Pneumonia 02/13/2015    Sallyanne Kuster 06/27/2015, 11:15 PM  Sallyanne Kuster, PTA, [facility] [address], [address] [place], [place] [address] [phone] 06/27/2015, 11:15 PM

## 2015-06-29 ENCOUNTER — Encounter: Payer: Self-pay | Admitting: Physical Therapy

## 2015-06-29 ENCOUNTER — Ambulatory Visit: Payer: Medicare Other | Admitting: Physical Therapy

## 2015-06-29 DIAGNOSIS — R269 Unspecified abnormalities of gait and mobility: Secondary | ICD-10-CM | POA: Diagnosis not present

## 2015-06-29 DIAGNOSIS — Z4789 Encounter for other orthopedic aftercare: Secondary | ICD-10-CM

## 2015-06-29 DIAGNOSIS — Z89611 Acquired absence of right leg above knee: Secondary | ICD-10-CM | POA: Diagnosis not present

## 2015-06-29 DIAGNOSIS — Z5189 Encounter for other specified aftercare: Secondary | ICD-10-CM | POA: Diagnosis not present

## 2015-06-29 DIAGNOSIS — R2681 Unsteadiness on feet: Secondary | ICD-10-CM | POA: Diagnosis not present

## 2015-06-29 DIAGNOSIS — R6889 Other general symptoms and signs: Secondary | ICD-10-CM | POA: Diagnosis not present

## 2015-06-29 DIAGNOSIS — R2689 Other abnormalities of gait and mobility: Secondary | ICD-10-CM

## 2015-06-29 DIAGNOSIS — R531 Weakness: Secondary | ICD-10-CM | POA: Diagnosis not present

## 2015-06-29 DIAGNOSIS — R29818 Other symptoms and signs involving the nervous system: Secondary | ICD-10-CM | POA: Diagnosis not present

## 2015-06-30 NOTE — Therapy (Signed)
[facility] [address] [place], [place], [address] Phone: [phone]   Fax:  [phone]  Physical Therapy Treatment  Patient Details  Name: [name] MRN: [id number] Date of Birth: 1946-05-04 Referring Provider:  Renford Dills, MD  Encounter Date: 06/29/2015      PT End of Session - 06/29/15 1530    Visit Number 5   Number of Visits 18   Date for PT Re-Evaluation 08/06/15   Authorization Type Medicare G-code every 10th visit   PT Start Time 1530   PT Stop Time 1615   PT Time Calculation (min) 45 min   Equipment Utilized During Treatment Gait belt   Activity Tolerance Patient tolerated treatment well   Behavior During Therapy [facility] for tasks assessed/performed      History reviewed. No pertinent past medical history.  History reviewed. No pertinent past surgical history.  There were no vitals filed for this visit.  Visit Diagnosis:  Abnormality of gait  Unsteadiness  Balance problems  Decreased functional activity tolerance  Functional weakness  Status post above knee amputation of right lower extremity  Encounter for prosthetic gait training      Subjective Assessment - 06/29/15 1536    Subjective (p) No falls. She was sick Thursday & Friday but wore prosthesis some.    Currently in Pain? (p) No/denies                         [facility] Adult PT Treatment/Exercise - 06/29/15 1530    Transfers   Transfers Floor to Transfer   Sit to Stand 5: Supervision;With upper extremity assist;With armrests;From chair/3-in-1   Sit to Stand Details Verbal cues for technique;Verbal cues for precautions/safety   Stand to Sit 5: Supervision;With armrests;With upper extremity assist;To chair/3-in-1   Stand to Sit Details (indicate cue type and reason) Verbal cues for precautions/safety;Verbal cues for technique   Floor to Transfer 5: Supervision;With upper extremity assist  using mat  table to push up   Floor to Transfer Details (indicate cue type and reason) PT demo technique & verbal cues during   Floor to Transfer Details Visual cues/gestures for precautions/safety;Visual cues/gestures for sequencing;Verbal cues for sequencing;Verbal cues for technique   Ambulation/Gait   Ambulation/Gait Yes   Ambulation/Gait Assistance 4: Min guard   Ambulation/Gait Assistance Details cues on step length, prosthetic knee control, wt shift, and sequence with crutches   Ambulation Distance (Feet) 150 Feet  150' X 2 with RW, 125' with crutches   Assistive device Rollator;Prosthesis   Gait Pattern Step-through pattern;Decreased step length - left;Decreased stance time - right;Decreased stride length;Decreased weight shift to right;Trunk flexed   Ambulation Surface Indoor;Level   Stairs Yes   Stairs Assistance 4: Min guard   Stairs Assistance Details (indicate cue type and reason) cues on technique   Stair Management Technique One rail Right;Step to pattern;Forwards;With crutches  1 crutch & 1 rail with prosthesis   Number of Stairs 4   Ramp 4: Min assist;Other (comment)  crutches & prosthesis   Ramp Details (indicate cue type and reason) cues on technique including wt shift & step length   Curb 4: Min assist  crutches & prosthesis   Curb Details (indicate cue type and reason) cues on technique   Prosthetics   Prosthetic Care Comments  Fall risk when prosthesis off; wearing when sick if out of bed but limiting activities   Current prosthetic wear tolerance (days/week)  daily  Current prosthetic wear tolerance (#hours/day)  5-6hrs 2x/day   Residual limb condition  intact per pt.   Education Provided Residual limb care;Proper Donning;Proper Doffing;Proper wear schedule/adjustment   Person(s) Educated Patient   Education Method Explanation;Tactile cues;Verbal cues   Education Method Verbalized understanding;Verbal cues required;Needs further instruction   Donning Prosthesis  Supervision   Doffing Prosthesis Supervision                PT Education - 06/29/15 1530    Education provided Yes   Education Details claudication pain in sound limb & monitoring to gauge distance able to  ambulate   Person(s) Educated Patient   Methods Explanation   Comprehension Verbalized understanding;Need further instruction          PT Short Term Goals - 06/07/15 1300    PT SHORT TERM GOAL #1   Title Patient tolerates wear >10 hrs /day without skin integrity issues or pain in residual limb ( Target Date: 07/07/2015)   Time 1   Period Months   Status New   PT SHORT TERM GOAL #2   Title Patient donnes prosthesis independently & correctly. ( Target Date: 07/07/2015)   Time 1   Period Months   Status New   PT SHORT TERM GOAL #3   Title Patient ambulates 300' with walker or crutches with prosthesis with supervision. ( Target Date: 07/07/2015)   Time 1   Period Months   Status New   PT SHORT TERM GOAL #4   Title Patient negotiates stairs (1 rail), ramps & curbs with walker or crutches with supervision. ( Target Date: 07/07/2015)   Time 1   Period Months   Status New   PT SHORT TERM GOAL #5   Title patient reaches 7" and to floor without UE support with supervision safely. ( Target Date: 07/07/2015)   Time 1   Period Months   Status New           PT Long Term Goals - 06/07/15 1300    PT LONG TERM GOAL #1   Title patient tolerates prosthesis wear >90% of awake hours without skin integrity issues or limb discomfort to enable function throughout her day. ( Target Date: 08/06/2015)   Time 2   Period Months   Status New   PT LONG TERM GOAL #2   Title Patient verbalizes / demonstrates proper prosthetic care to enable safe use of prosthesis. ( Target Date: 08/06/2015)   Time 2   Period Months   Status New   PT LONG TERM GOAL #3   Title Patient ambulates household distance of 80' with cane or less with prosthesis carrying light weight object modified  independent. ( Target Date: 08/06/2015)   Time 2   Period Months   Status New   PT LONG TERM GOAL #4   Title patient ambulates 400' with LRAD & prosthesis modified independent to enable community access. ( Target Date: 08/06/2015)   Time 2   Period Months   Status New   PT LONG TERM GOAL #5   Title Patient negotiates ramps, curbs & stairs with LRAD & prosthesis modified independent to enable community access. ( Target Date: 08/06/2015)   Time 2   Period Months   Status New   Additional Long Term Goals   Additional Long Term Goals Yes   PT LONG TERM GOAL #6   Title Berg Balance Test >36/56 to reduce fall risk. ( Target Date: 08/06/2015)   Time 2   Status New  PT LONG TERM GOAL #7   Title Patient self reports via FOTO a functional status of >45 ( Target Date: 08/06/2015)   Time 2   Period Months   Status New               Plan - 06/29/15 1530    Clinical Impression Statement Patient is on target to meet STGs. Patient improved mobility with crutches in including ramps & curbs. She had claudication pain in non-amputated leg that limited gait. Patient appears to understand floor transfers if she falls, so she can get up.   Pt will benefit from skilled therapeutic intervention in order to improve on the following deficits Abnormal gait;Decreased activity tolerance;Decreased balance;Decreased endurance;Decreased knowledge of use of DME;Decreased mobility;Decreased range of motion;Impaired flexibility;Postural dysfunction;Prosthetic Dependency   Rehab Potential Good   PT Frequency 2x / week   PT Duration Other (comment)  9 weeks (60 days)   PT Treatment/Interventions ADLs/Self Care Home Management;Gait training;Stair training;DME Instruction;Functional mobility training;Therapeutic activities;Therapeutic exercise;Balance training;Neuromuscular re-education;Patient/family education;Prosthetic Training   PT Next Visit Plan Review prosthetic care, review stairs, ramps, curbs with  crutches, balance activities   Consulted and Agree with Plan of Care Patient        Problem List Patient Active Problem List   Diagnosis Date Noted  . Pneumonia 02/13/2015    Hildagard Sobecki PT, DPT 06/30/2015, 7:11 AM  [place] [facility] [address] [place], [place], [address] Phone: [phone]   Fax:  [phone]

## 2015-07-01 ENCOUNTER — Encounter: Payer: Self-pay | Admitting: Physical Therapy

## 2015-07-01 ENCOUNTER — Ambulatory Visit: Payer: Medicare Other | Admitting: Physical Therapy

## 2015-07-01 DIAGNOSIS — R269 Unspecified abnormalities of gait and mobility: Secondary | ICD-10-CM

## 2015-07-01 DIAGNOSIS — Z89611 Acquired absence of right leg above knee: Secondary | ICD-10-CM | POA: Diagnosis not present

## 2015-07-01 DIAGNOSIS — R2681 Unsteadiness on feet: Secondary | ICD-10-CM

## 2015-07-01 DIAGNOSIS — R2689 Other abnormalities of gait and mobility: Secondary | ICD-10-CM

## 2015-07-01 DIAGNOSIS — R531 Weakness: Secondary | ICD-10-CM | POA: Diagnosis not present

## 2015-07-01 DIAGNOSIS — Z5189 Encounter for other specified aftercare: Secondary | ICD-10-CM | POA: Diagnosis not present

## 2015-07-01 DIAGNOSIS — R6889 Other general symptoms and signs: Secondary | ICD-10-CM

## 2015-07-01 DIAGNOSIS — R29818 Other symptoms and signs involving the nervous system: Secondary | ICD-10-CM | POA: Diagnosis not present

## 2015-07-01 NOTE — Therapy (Signed)
[facility] [address] [place], [place], [address] Phone: [phone]   Fax:  [phone]  Physical Therapy Treatment  Patient Details  Name: [name] MRN: [id number] Date of Birth: 08/25/1946 Referring Provider:  Renford Dills, MD  Encounter Date: 07/01/2015   07/01/15 1541  PT Visits / Re-Eval  Visit Number 6  Number of Visits 18  Date for PT Re-Evaluation 08/06/15  Authorization  Authorization Type Medicare G-code every 10th visit  PT Time Calculation  PT Start Time 1532  PT Stop Time 1615  PT Time Calculation (min) 43 min  PT - End of Session  Equipment Utilized During Treatment Gait belt  Activity Tolerance Patient tolerated treatment well  Behavior During Therapy [facility] for tasks assessed/performed     History reviewed. No pertinent past medical history.  History reviewed. No pertinent past surgical history.  There were no vitals filed for this visit.  Visit Diagnosis:  Abnormality of gait  Unsteadiness  Balance problems  Decreased functional activity tolerance  Functional weakness      Subjective Assessment - 07/01/15 1540    Subjective No new compliants. No falls. Saw Kathlene November with [employer] and had some adjustments made, prosthesis feels much better now, more comfortable with sitting and walking.   Currently in Pain? No/denies          [facility] Adult PT Treatment/Exercise - 07/01/15 1542    Transfers   Sit to Stand 5: Supervision;With upper extremity assist;With armrests;From chair/3-in-1   Sit to Stand Details Verbal cues for technique;Verbal cues for precautions/safety   Sit to Stand Details (indicate cue type and reason) to chair and rollator seat   Stand to Sit 5: Supervision;With armrests;With upper extremity assist;To chair/3-in-1   Stand to Sit Details (indicate cue type and reason) Verbal cues for precautions/safety;Verbal cues for sequencing;Verbal cues for technique   Stand to Sit Details to chair and rollator seat.   Ambulation/Gait   Ambulation/Gait Yes   Ambulation/Gait Assistance 5: Supervision   Ambulation/Gait Assistance Details cues on posture, decreased step length with prosthesis and increased step length with other leg.   Ambulation Distance (Feet) 220 Feet  x1, 105 x1, 110 x1   Assistive device Rollator;Prosthesis   Gait Pattern Step-through pattern;Decreased step length - left;Decreased stance time - right;Decreased stride length;Decreased weight shift to right;Trunk flexed   Ambulation Surface Level;Indoor   Prosthetics   Current prosthetic wear tolerance (days/week)  daily   Current prosthetic wear tolerance (#hours/day)  5-6hrs 2x/day   Residual limb condition  intact per pt.   Education Provided Proper weight-bearing schedule/adjustment;Proper wear schedule/adjustment;Residual limb care   Person(s) Educated Patient   Education Method Explanation   Education Method Verbalized understanding   Donning Prosthesis Supervision   Doffing Prosthesis Supervision     Neuro Re-ed: Standing by mat with rollator in front for balance as needed: With dowel rod: UE raises and upper trunk rotation x 10 each with min guard assist for balance.  Bean bag toss: forward with lateral reaching to mat, alternating sides with min guard assist to min assist for balance.        PT Short Term Goals - 06/07/15 1300    PT SHORT TERM GOAL #1   Title Patient tolerates wear >10 hrs /day without skin integrity issues or pain in residual limb ( Target Date: 07/07/2015)   Time 1   Period Months   Status New   PT SHORT TERM GOAL #2   Title Patient donnes  prosthesis independently & correctly. ( Target Date: 07/07/2015)   Time 1   Period Months   Status New   PT SHORT TERM GOAL #3   Title Patient ambulates 300' with walker or crutches with prosthesis with supervision. ( Target Date: 07/07/2015)   Time 1   Period Months   Status New   PT SHORT TERM GOAL #4    Title Patient negotiates stairs (1 rail), ramps & curbs with walker or crutches with supervision. ( Target Date: 07/07/2015)   Time 1   Period Months   Status New   PT SHORT TERM GOAL #5   Title patient reaches 7" and to floor without UE support with supervision safely. ( Target Date: 07/07/2015)   Time 1   Period Months   Status New           PT Long Term Goals - 06/07/15 1300    PT LONG TERM GOAL #1   Title patient tolerates prosthesis wear >90% of awake hours without skin integrity issues or limb discomfort to enable function throughout her day. ( Target Date: 08/06/2015)   Time 2   Period Months   Status New   PT LONG TERM GOAL #2   Title Patient verbalizes / demonstrates proper prosthetic care to enable safe use of prosthesis. ( Target Date: 08/06/2015)   Time 2   Period Months   Status New   PT LONG TERM GOAL #3   Title Patient ambulates household distance of 46' with cane or less with prosthesis carrying light weight object modified independent. ( Target Date: 08/06/2015)   Time 2   Period Months   Status New   PT LONG TERM GOAL #4   Title patient ambulates 400' with LRAD & prosthesis modified independent to enable community access. ( Target Date: 08/06/2015)   Time 2   Period Months   Status New   PT LONG TERM GOAL #5   Title Patient negotiates ramps, curbs & stairs with LRAD & prosthesis modified independent to enable community access. ( Target Date: 08/06/2015)   Time 2   Period Months   Status New   Additional Long Term Goals   Additional Long Term Goals Yes   PT LONG TERM GOAL #6   Title Berg Balance Test >36/56 to reduce fall risk. ( Target Date: 08/06/2015)   Time 2   Status New   PT LONG TERM GOAL #7   Title Patient self reports via FOTO a functional status of >45 ( Target Date: 08/06/2015)   Time 2   Period Months   Status New        07/01/15 1542  Plan  Clinical Impression Statement Worked on gait and unsupported balance today with no issues  reported by pt. Pt making steady progress toward goals.  Pt will benefit from skilled therapeutic intervention in order to improve on the following deficits Abnormal gait;Decreased activity tolerance;Decreased balance;Decreased endurance;Decreased knowledge of use of DME;Decreased mobility;Decreased range of motion;Impaired flexibility;Postural dysfunction;Prosthetic Dependency  Rehab Potential Good  PT Frequency 2x / week  PT Duration Other (comment) (9 weeks (60 days))  PT Treatment/Interventions ADLs/Self Care Home Management;Gait training;Stair training;DME Instruction;Functional mobility training;Therapeutic activities;Therapeutic exercise;Balance training;Neuromuscular re-education;Patient/family education;Prosthetic Training  PT Next Visit Plan Review prosthetic care, review stairs, ramps, curbs with crutches, balance activities  Consulted and Agree with Plan of Care Patient    Problem List Patient Active Problem List   Diagnosis Date Noted  . Pneumonia 02/13/2015    Sallyanne Kuster 07/01/2015, 4:22  PM  Sallyanne Kuster, PTA, [facility] [address], [address] [place], [place] [address] [phone] 07/01/2015, 4:22 PM

## 2015-07-06 ENCOUNTER — Ambulatory Visit: Payer: Medicare Other | Admitting: Physical Therapy

## 2015-07-06 ENCOUNTER — Encounter: Payer: Self-pay | Admitting: Physical Therapy

## 2015-07-06 DIAGNOSIS — Z5189 Encounter for other specified aftercare: Secondary | ICD-10-CM | POA: Diagnosis not present

## 2015-07-06 DIAGNOSIS — R269 Unspecified abnormalities of gait and mobility: Secondary | ICD-10-CM

## 2015-07-06 DIAGNOSIS — R6889 Other general symptoms and signs: Secondary | ICD-10-CM

## 2015-07-06 DIAGNOSIS — R2681 Unsteadiness on feet: Secondary | ICD-10-CM | POA: Diagnosis not present

## 2015-07-06 DIAGNOSIS — Z89611 Acquired absence of right leg above knee: Secondary | ICD-10-CM | POA: Diagnosis not present

## 2015-07-06 DIAGNOSIS — R29818 Other symptoms and signs involving the nervous system: Secondary | ICD-10-CM | POA: Diagnosis not present

## 2015-07-06 DIAGNOSIS — R531 Weakness: Secondary | ICD-10-CM

## 2015-07-06 DIAGNOSIS — R2689 Other abnormalities of gait and mobility: Secondary | ICD-10-CM

## 2015-07-08 ENCOUNTER — Ambulatory Visit: Payer: Medicare Other | Admitting: Physical Therapy

## 2015-07-08 DIAGNOSIS — I70211 Atherosclerosis of native arteries of extremities with intermittent claudication, right leg: Secondary | ICD-10-CM | POA: Diagnosis not present

## 2015-07-08 DIAGNOSIS — I824Z9 Acute embolism and thrombosis of unspecified deep veins of unspecified distal lower extremity: Secondary | ICD-10-CM | POA: Diagnosis not present

## 2015-07-09 NOTE — Therapy (Signed)
[facility] [address] [place], [place], [address] Phone: [phone]   Fax:  [phone]  Physical Therapy Treatment  Patient Details  Name: [name] MRN: [id number] Date of Birth: October 27, 1945 Referring Provider:  Renford Dills, MD  Encounter Date: 07/06/2015   07/06/15 1448  PT Visits / Re-Eval  Visit Number 7  Number of Visits 18  Date for PT Re-Evaluation 08/06/15  Authorization  Authorization Type Medicare G-code every 10th visit  PT Time Calculation  PT Start Time 1443  PT Stop Time 1530  PT Time Calculation (min) 47 min  PT - End of Session  Equipment Utilized During Treatment Gait belt  Activity Tolerance Patient tolerated treatment well  Behavior During Therapy [facility] for tasks assessed/performed     History reviewed. No pertinent past medical history.  History reviewed. No pertinent past surgical history.  There were no vitals filed for this visit.  Visit Diagnosis:  Abnormality of gait  Unsteadiness  Balance problems  Decreased functional activity tolerance  Functional weakness     07/06/15 1446  Symptoms/Limitations  Subjective No new complaints. No falls or pain to report. Still getting dizzy at times. Goes for INR check tomorrow.   Pain Assessment  Currently in Pain? No/denies      07/06/15 1449  Transfers  Sit to Stand 5: Supervision;With upper extremity assist;With armrests;From chair/3-in-1  Sit to Stand Details Verbal cues for safe use of DME/AE  Sit to Stand Details (indicate cue type and reason) for crutch placement with standing  Stand to Sit 5: Supervision;With armrests;With upper extremity assist;To chair/3-in-1  Stand to Sit Details (indicate cue type and reason) Verbal cues for safe use of DME/AE  Stand to Sit Details for crutch placement with sitting down  Ambulation/Gait  Ambulation/Gait Yes  Ambulation/Gait Assistance 4: Min guard  Ambulation/Gait  Assistance Details cues on posture, sequence and to correct gait deviations  Ambulation Distance (Feet) 115 Feet (xz1; 80 feet x 2 reps)  Assistive device Crutches;Prosthesis  Gait Pattern Step-through pattern;Decreased step length - left;Decreased stance time - right;Decreased stride length;Decreased weight shift to right;Trunk flexed  Ambulation Surface Level;Indoor  Stairs Yes  Stairs Assistance 4: Min guard  Stairs Assistance Details (indicate cue type and reason) occasional cues on sequence/technique  Stair Management Technique One rail Left;One rail Right;Step to pattern;Forwards;With crutches  Number of Stairs 4 (x2 reps)  Ramp 4: Min assist (crutches and prosthesis)  Ramp Details (indicate cue type and reason) cues on technique/sequence  Curb 4: Min assist (crutches and prosthesis)  Curb Details (indicate cue type and reason) cues on posture and sequence  Prosthetics  Prosthetic Care Comments  Edcuated on signs of sweating and importance of drying. Also on use of antipersperant to assist with sweat control.                     Current prosthetic wear tolerance (days/week)  daily  Current prosthetic wear tolerance (#hours/day)  7-8 total per day, drying as needed (usually every 4 hours)  Residual limb condition  intact per pt.  Education Provided Proper wear schedule/adjustment;Residual limb care  Person(s) Educated Patient  Education Method Explanation  Education Method Verbalized understanding  Donning Prosthesis 5  Doffing Prosthesis 5         PT Short Term Goals - 06/07/15 1300    PT SHORT TERM GOAL #1   Title Patient tolerates wear >10 hrs /day without skin integrity issues or pain in residual limb (  Target Date: 07/07/2015)   Time 1   Period Months   Status New   PT SHORT TERM GOAL #2   Title Patient donnes prosthesis independently & correctly. ( Target Date: 07/07/2015)   Time 1   Period Months   Status New   PT SHORT TERM GOAL #3   Title Patient ambulates  300' with walker or crutches with prosthesis with supervision. ( Target Date: 07/07/2015)   Time 1   Period Months   Status New   PT SHORT TERM GOAL #4   Title Patient negotiates stairs (1 rail), ramps & curbs with walker or crutches with supervision. ( Target Date: 07/07/2015)   Time 1   Period Months   Status New   PT SHORT TERM GOAL #5   Title patient reaches 7" and to floor without UE support with supervision safely. ( Target Date: 07/07/2015)   Time 1   Period Months   Status New           PT Long Term Goals - 06/07/15 1300    PT LONG TERM GOAL #1   Title patient tolerates prosthesis wear >90% of awake hours without skin integrity issues or limb discomfort to enable function throughout her day. ( Target Date: 08/06/2015)   Time 2   Period Months   Status New   PT LONG TERM GOAL #2   Title Patient verbalizes / demonstrates proper prosthetic care to enable safe use of prosthesis. ( Target Date: 08/06/2015)   Time 2   Period Months   Status New   PT LONG TERM GOAL #3   Title Patient ambulates household distance of 59' with cane or less with prosthesis carrying light weight object modified independent. ( Target Date: 08/06/2015)   Time 2   Period Months   Status New   PT LONG TERM GOAL #4   Title patient ambulates 400' with LRAD & prosthesis modified independent to enable community access. ( Target Date: 08/06/2015)   Time 2   Period Months   Status New   PT LONG TERM GOAL #5   Title Patient negotiates ramps, curbs & stairs with LRAD & prosthesis modified independent to enable community access. ( Target Date: 08/06/2015)   Time 2   Period Months   Status New   Additional Long Term Goals   Additional Long Term Goals Yes   PT LONG TERM GOAL #6   Title Berg Balance Test >36/56 to reduce fall risk. ( Target Date: 08/06/2015)   Time 2   Status New   PT LONG TERM GOAL #7   Title Patient self reports via FOTO a functional status of >45 ( Target Date: 08/06/2015)   Time 2    Period Months   Status New        07/06/15 1449  Plan  Clinical Impression Statement Continued to work with crutches and prosthesis in session today with no issues reported. Pt making steady progress toward goals.  Pt will benefit from skilled therapeutic intervention in order to improve on the following deficits Abnormal gait;Decreased activity tolerance;Decreased balance;Decreased endurance;Decreased knowledge of use of DME;Decreased mobility;Decreased range of motion;Impaired flexibility;Postural dysfunction;Prosthetic Dependency  Rehab Potential Good  PT Frequency 2x / week  PT Duration Other (comment) (9 weeks (60 days))  PT Treatment/Interventions ADLs/Self Care Home Management;Gait training;Stair training;DME Instruction;Functional mobility training;Therapeutic activities;Therapeutic exercise;Balance training;Neuromuscular re-education;Patient/family education;Prosthetic Training  PT Next Visit Plan Review prosthetic care, review stairs, ramps, curbs with crutches, balance activities  Consulted and Agree with Plan  of Care Patient     Problem List Patient Active Problem List   Diagnosis Date Noted  . Pneumonia 02/13/2015    Sallyanne Kuster 07/09/2015, 1:31 PM  Sallyanne Kuster, PTA, [facility] [address] [place], [place] [address] [phone] 07/09/2015, 1:31 PM

## 2015-07-13 ENCOUNTER — Encounter: Payer: Self-pay | Admitting: Physical Therapy

## 2015-07-13 ENCOUNTER — Ambulatory Visit: Payer: Medicare Other | Admitting: Physical Therapy

## 2015-07-13 DIAGNOSIS — R29818 Other symptoms and signs involving the nervous system: Secondary | ICD-10-CM | POA: Diagnosis not present

## 2015-07-13 DIAGNOSIS — R269 Unspecified abnormalities of gait and mobility: Secondary | ICD-10-CM | POA: Diagnosis not present

## 2015-07-13 DIAGNOSIS — R531 Weakness: Secondary | ICD-10-CM | POA: Diagnosis not present

## 2015-07-13 DIAGNOSIS — R6889 Other general symptoms and signs: Secondary | ICD-10-CM

## 2015-07-13 DIAGNOSIS — R2681 Unsteadiness on feet: Secondary | ICD-10-CM | POA: Diagnosis not present

## 2015-07-13 DIAGNOSIS — R2689 Other abnormalities of gait and mobility: Secondary | ICD-10-CM

## 2015-07-13 DIAGNOSIS — Z89611 Acquired absence of right leg above knee: Secondary | ICD-10-CM | POA: Diagnosis not present

## 2015-07-13 DIAGNOSIS — Z4789 Encounter for other orthopedic aftercare: Secondary | ICD-10-CM

## 2015-07-13 DIAGNOSIS — Z5189 Encounter for other specified aftercare: Secondary | ICD-10-CM | POA: Diagnosis not present

## 2015-07-13 NOTE — Therapy (Signed)
[facility] [address] [place], [place], [address] Phone: [phone]   Fax:  [phone]  Physical Therapy Treatment  Patient Details  Name: [name] MRN: [id number] Date of Birth: 12/11/1945 Referring Provider:  Renford Dills, MD  Encounter Date: 07/13/2015      PT End of Session - 07/13/15 1530    Visit Number 8   Number of Visits 18   Date for PT Re-Evaluation 08/06/15   Authorization Type Medicare G-code every 10th visit   PT Start Time 1530   PT Stop Time 1615   PT Time Calculation (min) 45 min   Equipment Utilized During Treatment Gait belt   Activity Tolerance Patient tolerated treatment well   Behavior During Therapy [facility] for tasks assessed/performed      History reviewed. No pertinent past medical history.  History reviewed. No pertinent past surgical history.  There were no vitals filed for this visit.  Visit Diagnosis:  Abnormality of gait  Unsteadiness  Balance problems  Decreased functional activity tolerance  Functional weakness  Status post above knee amputation of right lower extremity  Encounter for prosthetic gait training      Subjective Assessment - 07/13/15 1537    Subjective (p) Her INR is now 2.4 and goal is between 2 & 3 so they are continuing dose of  4days/wk and 2.5mg  3x/wk. She is wearing prosthesis 2 hrs 2x/day.    Currently in Pain? (p) No/denies                         [facility] Adult PT Treatment/Exercise - 07/13/15 1530    Transfers   Sit to Stand 5: Supervision;With upper extremity assist;With armrests;From chair/3-in-1   Sit to Stand Details Verbal cues for safe use of DME/AE   Stand to Sit 5: Supervision;With armrests;With upper extremity assist;To chair/3-in-1   Stand to Sit Details (indicate cue type and reason) Verbal cues for safe use of DME/AE   Ambulation/Gait   Ambulation/Gait Yes   Ambulation/Gait Assistance 5:  Supervision;3: Mod assist  SBA with RW & ModA with [facility]   Ambulation/Gait Assistance Details PT demo, verbal cues on step-thru pattern with increased prosthesis stance duration & initial contact with heel to facilitate prosthetic knee control; SBQC sequence with carryover of knee control, wt shift and posture   Ambulation Distance (Feet) 125 Feet  125' X 3 with RW, 75' X 2 with SBQC   Assistive device Prosthesis;Rolling walker;Small based quad cane;1 person hand held assist  hand hold with SBQC   Gait Pattern Step-through pattern;Decreased step length - left;Decreased stance time - right;Decreased stride length;Decreased weight shift to right;Trunk flexed   Ambulation Surface Indoor;Level   Stairs Yes   Stairs Assistance 5: Supervision   Stairs Assistance Details (indicate cue type and reason) demo, verbal cues on various single rail location.   Stair Management Technique One rail Left;One rail Right;Step to pattern;Forwards;With cane   Number of Stairs 4  x2 reps   Ramp --   Curb --   Prosthetics   Prosthetic Care Comments  increase wear to 4 hrs 2x/day with increase weekly   Current prosthetic wear tolerance (days/week)  daily   Current prosthetic wear tolerance (#hours/day)  current 2 hrs 2x/day increased to 4 hrs 2x/day   Residual limb condition  intact per pt.   Education Provided Proper wear schedule/adjustment;Residual limb care   Person(s) Educated Patient   Education Method Explanation;Verbal cues  Education Method Verbalized understanding;Verbal cues required;Needs further instruction   Donning Prosthesis Supervision                  PT Short Term Goals - 06/07/15 1300    PT SHORT TERM GOAL #1   Title Patient tolerates wear >10 hrs /day without skin integrity issues or pain in residual limb ( Target Date: 07/07/2015)   Time 1   Period Months   Status New   PT SHORT TERM GOAL #2   Title Patient donnes prosthesis independently & correctly. ( Target Date:  07/07/2015)   Time 1   Period Months   Status New   PT SHORT TERM GOAL #3   Title Patient ambulates 300' with walker or crutches with prosthesis with supervision. ( Target Date: 07/07/2015)   Time 1   Period Months   Status New   PT SHORT TERM GOAL #4   Title Patient negotiates stairs (1 rail), ramps & curbs with walker or crutches with supervision. ( Target Date: 07/07/2015)   Time 1   Period Months   Status New   PT SHORT TERM GOAL #5   Title patient reaches 7" and to floor without UE support with supervision safely. ( Target Date: 07/07/2015)   Time 1   Period Months   Status New           PT Long Term Goals - 06/07/15 1300    PT LONG TERM GOAL #1   Title patient tolerates prosthesis wear >90% of awake hours without skin integrity issues or limb discomfort to enable function throughout her day. ( Target Date: 08/06/2015)   Time 2   Period Months   Status New   PT LONG TERM GOAL #2   Title Patient verbalizes / demonstrates proper prosthetic care to enable safe use of prosthesis. ( Target Date: 08/06/2015)   Time 2   Period Months   Status New   PT LONG TERM GOAL #3   Title Patient ambulates household distance of 89' with cane or less with prosthesis carrying light weight object modified independent. ( Target Date: 08/06/2015)   Time 2   Period Months   Status New   PT LONG TERM GOAL #4   Title patient ambulates 400' with LRAD & prosthesis modified independent to enable community access. ( Target Date: 08/06/2015)   Time 2   Period Months   Status New   PT LONG TERM GOAL #5   Title Patient negotiates ramps, curbs & stairs with LRAD & prosthesis modified independent to enable community access. ( Target Date: 08/06/2015)   Time 2   Period Months   Status New   Additional Long Term Goals   Additional Long Term Goals Yes   PT LONG TERM GOAL #6   Title Berg Balance Test >36/56 to reduce fall risk. ( Target Date: 08/06/2015)   Time 2   Status New   PT LONG TERM GOAL  #7   Title Patient self reports via FOTO a functional status of >45 ( Target Date: 08/06/2015)   Time 2   Period Months   Status New               Plan - 07/13/15 1530    Clinical Impression Statement Patient improved gait with RW with more fluent gait with increased stance duration on prosthesis after PT demo & instructed. Patient was able to ambulate with [facility] & hand hold for first time. Patient has potential to use [facility] in  house which wil allow a free hand to carry items.   Pt will benefit from skilled therapeutic intervention in order to improve on the following deficits Abnormal gait;Decreased activity tolerance;Decreased balance;Decreased endurance;Decreased knowledge of use of DME;Decreased mobility;Decreased range of motion;Impaired flexibility;Postural dysfunction;Prosthetic Dependency   Rehab Potential Good   PT Frequency 2x / week   PT Duration Other (comment)  9 weeks (60 days)   PT Treatment/Interventions ADLs/Self Care Home Management;Gait training;Stair training;DME Instruction;Functional mobility training;Therapeutic activities;Therapeutic exercise;Balance training;Neuromuscular re-education;Patient/family education;Prosthetic Training   PT Next Visit Plan Review prosthetic care, review stairs, ramps, curbs with crutches or RW, SBQC for household gait, balance activities   Consulted and Agree with Plan of Care Patient        Problem List Patient Active Problem List   Diagnosis Date Noted  . Pneumonia 02/13/2015    Vladimir Faster PT, DPT 07/13/2015, 10:57 PM  [facility] [address] [place], [place], [address] Phone: [phone]   Fax:  [phone]

## 2015-07-14 ENCOUNTER — Inpatient Hospital Stay
Admission: EM | Admit: 2015-07-14 | Discharge: 2015-07-26 | DRG: 853 | Disposition: A | Discharge status: Skilled Nursing Facility | Payer: Medicare Other | Attending: Internal Medicine | Admitting: Internal Medicine

## 2015-07-14 ENCOUNTER — Inpatient Hospital Stay: Payer: Medicare Other

## 2015-07-14 ENCOUNTER — Encounter: Payer: Self-pay | Admitting: Emergency Medicine

## 2015-07-14 ENCOUNTER — Other Ambulatory Visit: Payer: Self-pay | Admitting: Pulmonary Disease

## 2015-07-14 DIAGNOSIS — R2681 Unsteadiness on feet: Secondary | ICD-10-CM | POA: Diagnosis not present

## 2015-07-14 DIAGNOSIS — M6281 Muscle weakness (generalized): Secondary | ICD-10-CM | POA: Diagnosis not present

## 2015-07-14 DIAGNOSIS — Z5189 Encounter for other specified aftercare: Secondary | ICD-10-CM | POA: Diagnosis not present

## 2015-07-14 DIAGNOSIS — Z66 Do not resuscitate: Secondary | ICD-10-CM | POA: Diagnosis not present

## 2015-07-14 DIAGNOSIS — E784 Other hyperlipidemia: Secondary | ICD-10-CM | POA: Diagnosis not present

## 2015-07-14 DIAGNOSIS — Z7901 Long term (current) use of anticoagulants: Secondary | ICD-10-CM | POA: Diagnosis not present

## 2015-07-14 DIAGNOSIS — I96 Gangrene, not elsewhere classified: Secondary | ICD-10-CM | POA: Diagnosis not present

## 2015-07-14 DIAGNOSIS — R918 Other nonspecific abnormal finding of lung field: Secondary | ICD-10-CM | POA: Diagnosis not present

## 2015-07-14 DIAGNOSIS — Z79899 Other long term (current) drug therapy: Secondary | ICD-10-CM | POA: Diagnosis not present

## 2015-07-14 DIAGNOSIS — N179 Acute kidney failure, unspecified: Secondary | ICD-10-CM | POA: Diagnosis not present

## 2015-07-14 DIAGNOSIS — N270 Small kidney, unilateral: Secondary | ICD-10-CM | POA: Diagnosis not present

## 2015-07-14 DIAGNOSIS — R0602 Shortness of breath: Secondary | ICD-10-CM | POA: Diagnosis not present

## 2015-07-14 DIAGNOSIS — D500 Iron deficiency anemia secondary to blood loss (chronic): Secondary | ICD-10-CM | POA: Diagnosis not present

## 2015-07-14 DIAGNOSIS — R6521 Severe sepsis with septic shock: Secondary | ICD-10-CM | POA: Diagnosis present

## 2015-07-14 DIAGNOSIS — E872 Acidosis: Secondary | ICD-10-CM | POA: Diagnosis present

## 2015-07-14 DIAGNOSIS — F17210 Nicotine dependence, cigarettes, uncomplicated: Secondary | ICD-10-CM | POA: Diagnosis not present

## 2015-07-14 DIAGNOSIS — G546 Phantom limb syndrome with pain: Secondary | ICD-10-CM | POA: Diagnosis not present

## 2015-07-14 DIAGNOSIS — E873 Alkalosis: Secondary | ICD-10-CM | POA: Diagnosis not present

## 2015-07-14 DIAGNOSIS — E876 Hypokalemia: Secondary | ICD-10-CM | POA: Diagnosis not present

## 2015-07-14 DIAGNOSIS — N184 Chronic kidney disease, stage 4 (severe): Secondary | ICD-10-CM | POA: Diagnosis present

## 2015-07-14 DIAGNOSIS — N189 Chronic kidney disease, unspecified: Secondary | ICD-10-CM | POA: Diagnosis not present

## 2015-07-14 DIAGNOSIS — A419 Sepsis, unspecified organism: Principal | ICD-10-CM | POA: Diagnosis present

## 2015-07-14 DIAGNOSIS — J9811 Atelectasis: Secondary | ICD-10-CM | POA: Diagnosis not present

## 2015-07-14 DIAGNOSIS — Z79891 Long term (current) use of opiate analgesic: Secondary | ICD-10-CM | POA: Diagnosis not present

## 2015-07-14 DIAGNOSIS — J9621 Acute and chronic respiratory failure with hypoxia: Secondary | ICD-10-CM | POA: Diagnosis not present

## 2015-07-14 DIAGNOSIS — K219 Gastro-esophageal reflux disease without esophagitis: Secondary | ICD-10-CM | POA: Diagnosis not present

## 2015-07-14 DIAGNOSIS — J189 Pneumonia, unspecified organism: Secondary | ICD-10-CM | POA: Diagnosis not present

## 2015-07-14 DIAGNOSIS — R531 Weakness: Secondary | ICD-10-CM | POA: Diagnosis not present

## 2015-07-14 DIAGNOSIS — Z89519 Acquired absence of unspecified leg below knee: Secondary | ICD-10-CM | POA: Diagnosis not present

## 2015-07-14 DIAGNOSIS — E860 Dehydration: Secondary | ICD-10-CM | POA: Diagnosis present

## 2015-07-14 DIAGNOSIS — J9600 Acute respiratory failure, unspecified whether with hypoxia or hypercapnia: Secondary | ICD-10-CM | POA: Diagnosis not present

## 2015-07-14 DIAGNOSIS — I70222 Atherosclerosis of native arteries of extremities with rest pain, left leg: Secondary | ICD-10-CM | POA: Diagnosis not present

## 2015-07-14 DIAGNOSIS — Z9911 Dependence on respirator [ventilator] status: Secondary | ICD-10-CM | POA: Diagnosis not present

## 2015-07-14 DIAGNOSIS — I7090 Unspecified atherosclerosis: Secondary | ICD-10-CM | POA: Diagnosis present

## 2015-07-14 DIAGNOSIS — I5020 Unspecified systolic (congestive) heart failure: Secondary | ICD-10-CM | POA: Diagnosis not present

## 2015-07-14 DIAGNOSIS — I5041 Acute combined systolic (congestive) and diastolic (congestive) heart failure: Secondary | ICD-10-CM | POA: Diagnosis not present

## 2015-07-14 DIAGNOSIS — M79605 Pain in left leg: Secondary | ICD-10-CM | POA: Diagnosis not present

## 2015-07-14 DIAGNOSIS — R0600 Dyspnea, unspecified: Secondary | ICD-10-CM

## 2015-07-14 DIAGNOSIS — I749 Embolism and thrombosis of unspecified artery: Secondary | ICD-10-CM

## 2015-07-14 DIAGNOSIS — D62 Acute posthemorrhagic anemia: Secondary | ICD-10-CM | POA: Diagnosis not present

## 2015-07-14 DIAGNOSIS — R29818 Other symptoms and signs involving the nervous system: Secondary | ICD-10-CM | POA: Diagnosis not present

## 2015-07-14 DIAGNOSIS — Z720 Tobacco use: Secondary | ICD-10-CM | POA: Diagnosis not present

## 2015-07-14 DIAGNOSIS — Z4781 Encounter for orthopedic aftercare following surgical amputation: Secondary | ICD-10-CM | POA: Diagnosis not present

## 2015-07-14 DIAGNOSIS — J9601 Acute respiratory failure with hypoxia: Secondary | ICD-10-CM | POA: Diagnosis not present

## 2015-07-14 DIAGNOSIS — L97429 Non-pressure chronic ulcer of left heel and midfoot with unspecified severity: Secondary | ICD-10-CM | POA: Diagnosis not present

## 2015-07-14 DIAGNOSIS — I708 Atherosclerosis of other arteries: Secondary | ICD-10-CM | POA: Diagnosis not present

## 2015-07-14 DIAGNOSIS — Z4682 Encounter for fitting and adjustment of non-vascular catheter: Secondary | ICD-10-CM | POA: Diagnosis not present

## 2015-07-14 DIAGNOSIS — K921 Melena: Secondary | ICD-10-CM | POA: Diagnosis not present

## 2015-07-14 DIAGNOSIS — I10 Essential (primary) hypertension: Secondary | ICD-10-CM | POA: Diagnosis not present

## 2015-07-14 DIAGNOSIS — Z89611 Acquired absence of right leg above knee: Secondary | ICD-10-CM | POA: Diagnosis not present

## 2015-07-14 DIAGNOSIS — Z7982 Long term (current) use of aspirin: Secondary | ICD-10-CM | POA: Diagnosis not present

## 2015-07-14 DIAGNOSIS — J90 Pleural effusion, not elsewhere classified: Secondary | ICD-10-CM | POA: Diagnosis not present

## 2015-07-14 DIAGNOSIS — M6282 Rhabdomyolysis: Secondary | ICD-10-CM | POA: Diagnosis not present

## 2015-07-14 DIAGNOSIS — I743 Embolism and thrombosis of arteries of the lower extremities: Secondary | ICD-10-CM | POA: Diagnosis not present

## 2015-07-14 DIAGNOSIS — I70262 Atherosclerosis of native arteries of extremities with gangrene, left leg: Secondary | ICD-10-CM | POA: Diagnosis not present

## 2015-07-14 DIAGNOSIS — J441 Chronic obstructive pulmonary disease with (acute) exacerbation: Secondary | ICD-10-CM | POA: Diagnosis not present

## 2015-07-14 DIAGNOSIS — Z885 Allergy status to narcotic agent status: Secondary | ICD-10-CM | POA: Diagnosis not present

## 2015-07-14 DIAGNOSIS — R112 Nausea with vomiting, unspecified: Secondary | ICD-10-CM | POA: Diagnosis not present

## 2015-07-14 DIAGNOSIS — I739 Peripheral vascular disease, unspecified: Secondary | ICD-10-CM | POA: Diagnosis not present

## 2015-07-14 DIAGNOSIS — R269 Unspecified abnormalities of gait and mobility: Secondary | ICD-10-CM | POA: Diagnosis not present

## 2015-07-14 DIAGNOSIS — N170 Acute kidney failure with tubular necrosis: Secondary | ICD-10-CM | POA: Diagnosis present

## 2015-07-14 DIAGNOSIS — J810 Acute pulmonary edema: Secondary | ICD-10-CM | POA: Diagnosis not present

## 2015-07-14 DIAGNOSIS — J449 Chronic obstructive pulmonary disease, unspecified: Secondary | ICD-10-CM | POA: Diagnosis present

## 2015-07-14 DIAGNOSIS — R109 Unspecified abdominal pain: Secondary | ICD-10-CM

## 2015-07-14 DIAGNOSIS — Z89511 Acquired absence of right leg below knee: Secondary | ICD-10-CM | POA: Diagnosis not present

## 2015-07-14 DIAGNOSIS — Z4659 Encounter for fitting and adjustment of other gastrointestinal appliance and device: Secondary | ICD-10-CM

## 2015-07-14 DIAGNOSIS — S78112S Complete traumatic amputation at level between left hip and knee, sequela: Secondary | ICD-10-CM | POA: Diagnosis not present

## 2015-07-14 DIAGNOSIS — I998 Other disorder of circulatory system: Secondary | ICD-10-CM | POA: Diagnosis not present

## 2015-07-14 DIAGNOSIS — R6889 Other general symptoms and signs: Secondary | ICD-10-CM | POA: Diagnosis not present

## 2015-07-14 DIAGNOSIS — Z452 Encounter for adjustment and management of vascular access device: Secondary | ICD-10-CM

## 2015-07-14 DIAGNOSIS — I255 Ischemic cardiomyopathy: Secondary | ICD-10-CM | POA: Diagnosis not present

## 2015-07-14 DIAGNOSIS — L8990 Pressure ulcer of unspecified site, unspecified stage: Secondary | ICD-10-CM | POA: Diagnosis present

## 2015-07-14 DIAGNOSIS — I340 Nonrheumatic mitral (valve) insufficiency: Secondary | ICD-10-CM | POA: Diagnosis not present

## 2015-07-14 HISTORY — DX: Small kidney, unilateral: N27.0

## 2015-07-14 HISTORY — DX: Tobacco use: Z72.0

## 2015-07-14 HISTORY — DX: Pneumonia, unspecified organism: J18.9

## 2015-07-14 HISTORY — DX: Peripheral vascular disease, unspecified: I73.9

## 2015-07-14 LAB — BLOOD GAS, ARTERIAL
Acid-base deficit: 13.9 mmol/L — ABNORMAL HIGH (ref 0.0–2.0)
Acid-base deficit: 21.5 mmol/L — ABNORMAL HIGH (ref 0.0–2.0)
Acid-base deficit: 11.4 mmol/L — ABNORMAL HIGH (ref 0.0–2.0)
Allens test (pass/fail): POSITIVE — AB
Bicarbonate: 10.3 mEq/L — ABNORMAL LOW (ref 21.0–28.0)
Bicarbonate: 9.7 mEq/L — ABNORMAL LOW (ref 21.0–28.0)
Bicarbonate: 15.6 mEq/L — ABNORMAL LOW (ref 21.0–28.0)
FIO2: 44
FIO2: 1
FIO2: 1
MECHVT: 400 mL
MECHVT: 400 mL
Mechanical Rate: 20
Mechanical Rate: 32
O2 Saturation: 88.6 %
O2 Saturation: 96.6 %
O2 Saturation: 98.4 %
PEEP: 5 cmH2O
PEEP: 5 cmH2O
Patient temperature: 37
Patient temperature: 37
Patient temperature: 37
RATE: 20 resp/min
pCO2 arterial: 21 mmHg — ABNORMAL LOW (ref 32.0–48.0)
pCO2 arterial: 41 mmHg (ref 32.0–48.0)
pCO2 arterial: 38 mmHg (ref 32.0–48.0)
pH, Arterial: 7.3 — ABNORMAL LOW (ref 7.350–7.450)
pH, Arterial: 6.98 — CL (ref 7.350–7.450)
pH, Arterial: 7.22 — ABNORMAL LOW (ref 7.350–7.450)
pO2, Arterial: 62 mmHg — ABNORMAL LOW (ref 83.0–108.0)
pO2, Arterial: 128 mmHg — ABNORMAL HIGH (ref 83.0–108.0)
pO2, Arterial: 133 mmHg — ABNORMAL HIGH (ref 83.0–108.0)

## 2015-07-14 LAB — COMPREHENSIVE METABOLIC PANEL
ALT: 30 U/L (ref 14–54)
AST: 91 U/L — ABNORMAL HIGH (ref 15–41)
Albumin: 3.6 g/dL (ref 3.5–5.0)
Alkaline Phosphatase: 110 U/L (ref 38–126)
Anion gap: 22 — ABNORMAL HIGH (ref 5–15)
BUN: 71 mg/dL — ABNORMAL HIGH (ref 6–20)
CO2: 14 mmol/L — ABNORMAL LOW (ref 22–32)
Calcium: 8.6 mg/dL — ABNORMAL LOW (ref 8.9–10.3)
Chloride: 99 mmol/L — ABNORMAL LOW (ref 101–111)
Creatinine, Ser: 2.18 mg/dL — ABNORMAL HIGH (ref 0.44–1.00)
GFR calc Af Amer: 25 mL/min — ABNORMAL LOW (ref >60)
GFR calc non Af Amer: 22 mL/min — ABNORMAL LOW (ref >60)
Glucose, Bld: 195 mg/dL — ABNORMAL HIGH (ref 65–99)
Potassium: 3.4 mmol/L — ABNORMAL LOW (ref 3.5–5.1)
Sodium: 135 mmol/L (ref 135–145)
Total Bilirubin: 0.8 mg/dL (ref 0.3–1.2)
Total Protein: 6.9 g/dL (ref 6.5–8.1)

## 2015-07-14 LAB — HEPARIN LEVEL (UNFRACTIONATED): Heparin Unfractionated: 0.52 IU/mL (ref 0.30–0.70)

## 2015-07-14 LAB — CK: Total CK: 349 U/L — ABNORMAL HIGH (ref 38–234)

## 2015-07-14 LAB — PROTIME-INR
INR: 3.38
Prothrombin Time: 34.2 seconds — ABNORMAL HIGH (ref 11.4–15.0)

## 2015-07-14 LAB — CBC
HCT: 30.8 % — ABNORMAL LOW (ref 35.0–47.0)
Hemoglobin: 9.7 g/dL — ABNORMAL LOW (ref 12.0–16.0)
MCH: 28.1 pg (ref 26.0–34.0)
MCHC: 31.4 g/dL — ABNORMAL LOW (ref 32.0–36.0)
MCV: 89.4 fL (ref 80.0–100.0)
Platelets: 383 10*3/uL (ref 150–440)
RBC: 3.44 MIL/uL — ABNORMAL LOW (ref 3.80–5.20)
RDW: 16.2 % — ABNORMAL HIGH (ref 11.5–14.5)
WBC: 32 10*3/uL — ABNORMAL HIGH (ref 3.6–11.0)

## 2015-07-14 LAB — TSH: TSH: 2.299 u[IU]/mL (ref 0.350–4.500)

## 2015-07-14 LAB — MRSA PCR SCREENING: MRSA by PCR: NEGATIVE

## 2015-07-14 LAB — HEMOGLOBIN A1C: Hgb A1c MFr Bld: 5.1 % (ref 4.0–6.0)

## 2015-07-14 LAB — GLUCOSE, CAPILLARY: Glucose-Capillary: 107 mg/dL — ABNORMAL HIGH (ref 65–99)

## 2015-07-14 LAB — LACTIC ACID, PLASMA: Lactic Acid, Venous: 6.4 mmol/L (ref 0.5–2.0)

## 2015-07-14 MED ORDER — STERILE WATER FOR INJECTION IJ SOLN
INTRAMUSCULAR | Status: AC
  Administered 2015-07-14: 19:00:00
  Filled 2015-07-14: qty 10

## 2015-07-14 MED ORDER — HYDROMORPHONE HCL 2 MG PO TABS
ORAL_TABLET | ORAL | Status: AC
  Administered 2015-07-14: 1 mg via ORAL
  Filled 2015-07-14: qty 1

## 2015-07-14 MED ORDER — LABETALOL HCL 5 MG/ML IV SOLN: 10.0000 mg | INTRAVENOUS | Status: DC | PRN

## 2015-07-14 MED ORDER — SODIUM CHLORIDE 0.9 % IV BOLUS (SEPSIS)
1000.0000 mL | Freq: Once | INTRAVENOUS | Status: AC
  Administered 2015-07-14: 1000 mL via INTRAVENOUS

## 2015-07-14 MED ORDER — ONDANSETRON HCL 4 MG/2ML IJ SOLN
4.0000 mg | Freq: Once | INTRAMUSCULAR | Status: AC
  Administered 2015-07-14: 4 mg via INTRAVENOUS

## 2015-07-14 MED ORDER — ASPIRIN 81 MG PO CHEW
81.0000 mg | CHEWABLE_TABLET | Freq: Every day | ORAL | Status: DC
  Administered 2015-07-14 – 2015-07-25 (×11): 81 mg via ORAL
  Filled 2015-07-14 (×12): qty 1

## 2015-07-14 MED ORDER — FENTANYL CITRATE (PF) 100 MCG/2ML IJ SOLN
INTRAMUSCULAR | Status: AC
  Administered 2015-07-14: 19:00:00
  Filled 2015-07-14: qty 2

## 2015-07-14 MED ORDER — MIDAZOLAM HCL 2 MG/2ML IJ SOLN
INTRAMUSCULAR | Status: AC
  Administered 2015-07-14: 2 mg
  Filled 2015-07-14: qty 2

## 2015-07-14 MED ORDER — HYDROMORPHONE HCL 1 MG/ML IJ SOLN
0.5000 mg | INTRAMUSCULAR | Status: DC | PRN
  Administered 2015-07-14: 1 mg via INTRAVENOUS
  Administered 2015-07-14 – 2015-07-21 (×4): 0.5 mg via INTRAVENOUS
  Filled 2015-07-14 (×4): qty 1

## 2015-07-14 MED ORDER — SODIUM CHLORIDE 0.9 % IJ SOLN
3.0000 mL | Freq: Two times a day (BID) | INTRAMUSCULAR | Status: DC
  Administered 2015-07-14 – 2015-07-25 (×17): 3 mL via INTRAVENOUS

## 2015-07-14 MED ORDER — FENTANYL CITRATE (PF) 100 MCG/2ML IJ SOLN
INTRAMUSCULAR | Status: AC
  Administered 2015-07-14: 25 ug via INTRAVENOUS
  Filled 2015-07-14: qty 2

## 2015-07-14 MED ORDER — LISINOPRIL-HYDROCHLOROTHIAZIDE 20-12.5 MG PO TABS: 1.0000 | ORAL_TABLET | Freq: Every day | ORAL | Status: DC

## 2015-07-14 MED ORDER — PIPERACILLIN-TAZOBACTAM 3.375 G IVPB
3.3750 g | Freq: Two times a day (BID) | INTRAVENOUS | Status: DC
  Administered 2015-07-14 (×2): 3.375 g via INTRAVENOUS
  Filled 2015-07-14 (×4): qty 50

## 2015-07-14 MED ORDER — SODIUM CHLORIDE 0.9 % IV BOLUS (SEPSIS)
1000.0000 mL | Freq: Once | INTRAVENOUS | Status: AC
Start: 2015-07-14 — End: 2015-07-14
  Administered 2015-07-14: 1000 mL via INTRAVENOUS

## 2015-07-14 MED ORDER — HEPARIN (PORCINE) IN NACL 100-0.45 UNIT/ML-% IJ SOLN
800.0000 [IU]/h | Freq: Once | INTRAMUSCULAR | Status: AC
  Administered 2015-07-14: 800 [IU]/h via INTRAVENOUS
  Filled 2015-07-14: qty 250

## 2015-07-14 MED ORDER — POTASSIUM CHLORIDE IN NACL 40-0.9 MEQ/L-% IV SOLN
INTRAVENOUS | Status: DC
  Administered 2015-07-14: 75 mL/h via INTRAVENOUS
  Filled 2015-07-14 (×4): qty 1000

## 2015-07-14 MED ORDER — ONDANSETRON HCL 4 MG PO TABS
4.0000 mg | ORAL_TABLET | Freq: Four times a day (QID) | ORAL | Status: DC | PRN
Start: 2015-07-14 — End: 2015-07-26

## 2015-07-14 MED ORDER — FAMOTIDINE 20 MG PO TABS
20.0000 mg | ORAL_TABLET | Freq: Every day | ORAL | Status: DC
  Administered 2015-07-14 – 2015-07-25 (×12): 20 mg via ORAL
  Filled 2015-07-14 (×12): qty 1

## 2015-07-14 MED ORDER — STERILE WATER FOR INJECTION IV SOLN
INTRAVENOUS | Status: DC
  Administered 2015-07-14 – 2015-07-17 (×6): via INTRAVENOUS
  Filled 2015-07-14 (×13): qty 850

## 2015-07-14 MED ORDER — HYDROMORPHONE HCL 1 MG/ML IJ SOLN
INTRAMUSCULAR | Status: AC
  Administered 2015-07-14: 1 mg via INTRAVENOUS
  Filled 2015-07-14: qty 1

## 2015-07-14 MED ORDER — FENTANYL 2500MCG IN NS 250ML (10MCG/ML) PREMIX INFUSION
INTRAVENOUS | Status: AC
  Administered 2015-07-14: 2500 ug via INTRAVENOUS
  Filled 2015-07-14: qty 250

## 2015-07-14 MED ORDER — POTASSIUM CHLORIDE IN NACL 20-0.9 MEQ/L-% IV SOLN
INTRAVENOUS | Status: DC
  Administered 2015-07-14: 14:00:00 via INTRAVENOUS
  Filled 2015-07-14 (×4): qty 1000

## 2015-07-14 MED ORDER — FENTANYL 2500MCG IN NS 250ML (10MCG/ML) PREMIX INFUSION
25.0000 ug/h | INTRAVENOUS | Status: DC
  Administered 2015-07-14: 2500 ug via INTRAVENOUS
  Administered 2015-07-15: 150 ug/h via INTRAVENOUS
  Administered 2015-07-15: 10 ug/h via INTRAVENOUS
  Administered 2015-07-16: 150 ug/h via INTRAVENOUS
  Administered 2015-07-16 – 2015-07-17 (×2): 250 ug/h via INTRAVENOUS
  Filled 2015-07-14 (×5): qty 250

## 2015-07-14 MED ORDER — HYDROCHLOROTHIAZIDE 12.5 MG PO CAPS
12.5000 mg | ORAL_CAPSULE | Freq: Every day | ORAL | Status: DC
  Filled 2015-07-14: qty 1

## 2015-07-14 MED ORDER — NOREPINEPHRINE 4 MG/250ML-% IV SOLN
0.0000 ug/min | INTRAVENOUS | Status: DC
  Administered 2015-07-14: 4 ug/min via INTRAVENOUS
  Administered 2015-07-15: 20 ug/min via INTRAVENOUS
  Administered 2015-07-15: 9 ug/min via INTRAVENOUS
  Administered 2015-07-15: 22 ug/min via INTRAVENOUS
  Filled 2015-07-14 (×4): qty 250

## 2015-07-14 MED ORDER — FENTANYL CITRATE (PF) 100 MCG/2ML IJ SOLN: 25.0000 ug | Freq: Once | INTRAMUSCULAR | Status: DC

## 2015-07-14 MED ORDER — HYDROMORPHONE HCL 2 MG PO TABS
1.0000 mg | ORAL_TABLET | ORAL | Status: DC | PRN
  Administered 2015-07-14: 1 mg via ORAL
  Filled 2015-07-14: qty 1

## 2015-07-14 MED ORDER — ONDANSETRON HCL 4 MG/2ML IJ SOLN
4.0000 mg | Freq: Four times a day (QID) | INTRAMUSCULAR | Status: DC | PRN
  Administered 2015-07-14 – 2015-07-23 (×4): 4 mg via INTRAVENOUS
  Filled 2015-07-14 (×3): qty 2

## 2015-07-14 MED ORDER — HEPARIN SODIUM (PORCINE) 5000 UNIT/ML IJ SOLN: 60.0000 [IU]/kg | Freq: Once | INTRAMUSCULAR | Status: DC

## 2015-07-14 MED ORDER — LISINOPRIL 20 MG PO TABS
20.0000 mg | ORAL_TABLET | Freq: Every day | ORAL | Status: DC
  Filled 2015-07-14: qty 1

## 2015-07-14 MED ORDER — NOREPINEPHRINE BITARTRATE 1 MG/ML IV SOLN: 0.0000 ug/min | INTRAVENOUS | Status: DC

## 2015-07-14 MED ORDER — HYDROMORPHONE HCL 1 MG/ML IJ SOLN
1.0000 mg | Freq: Once | INTRAMUSCULAR | Status: AC
  Administered 2015-07-14: 1 mg via INTRAVENOUS

## 2015-07-14 MED ORDER — MAGNESIUM HYDROXIDE 400 MG/5ML PO SUSP: 30.0000 mL | Freq: Every day | ORAL | Status: DC | PRN

## 2015-07-14 MED ORDER — SODIUM CHLORIDE 0.9 % IV SOLN: INTRAVENOUS | Status: DC | PRN

## 2015-07-14 MED ORDER — HEPARIN (PORCINE) IN NACL 100-0.45 UNIT/ML-% IJ SOLN
750.0000 [IU]/h | INTRAMUSCULAR | Status: DC
  Administered 2015-07-14: 750 [IU]/h via INTRAVENOUS
  Filled 2015-07-14: qty 250

## 2015-07-14 MED ORDER — SODIUM CHLORIDE 0.9 % IV BOLUS (SEPSIS)
1000.0000 mL | Freq: Once | INTRAVENOUS | Status: AC
Start: 2015-07-14 — End: 2015-07-14
  Administered 2015-07-14 (×2): 1000 mL via INTRAVENOUS

## 2015-07-14 MED ORDER — VECURONIUM BROMIDE 10 MG IV SOLR
INTRAVENOUS | Status: AC
  Administered 2015-07-14: 10 mg
  Filled 2015-07-14: qty 10

## 2015-07-14 MED ORDER — OXYCODONE-ACETAMINOPHEN 5-325 MG PO TABS
1.0000 | ORAL_TABLET | ORAL | Status: DC | PRN
  Administered 2015-07-14 – 2015-07-19 (×7): 1 via ORAL
  Filled 2015-07-14 (×7): qty 1

## 2015-07-14 MED ORDER — FENTANYL CITRATE (PF) 100 MCG/2ML IJ SOLN
25.0000 ug | Freq: Once | INTRAMUSCULAR | Status: AC
  Administered 2015-07-14: 25 ug via INTRAVENOUS

## 2015-07-14 MED ORDER — SODIUM BICARBONATE 8.4 % IV SOLN
100.0000 meq | Freq: Once | INTRAVENOUS | Status: AC
  Administered 2015-07-14: 100 meq via INTRAVENOUS
  Filled 2015-07-14: qty 100

## 2015-07-14 MED ORDER — NICOTINE 7 MG/24HR TD PT24
7.0000 mg | MEDICATED_PATCH | Freq: Every day | TRANSDERMAL | Status: DC
  Administered 2015-07-14: 7 mg via TRANSDERMAL
  Filled 2015-07-14 (×3): qty 1

## 2015-07-14 NOTE — Progress Notes (Signed)
Dr. Amado Coe paged, made aware of abdominal pain unrelieved by foley insertion. Foley drained 400cc which leaves approx. 255 cc left in bladder. MD is aware; RN asked if ok to continue with bolus given abd pain and MD said yes. Rn requested abd xray and dr. Amado Coe approved.

## 2015-07-14 NOTE — Procedures (Signed)
Arterial Line Placement: Indication: Frequent blood draws; Invasive BP monitoring.   Consent: Emergent.   Risks and benefits explained to patient and/or family in detail including risk of infection, bleeding, respiratory failure and death..   Hand washing performed prior to starting the procedure.   Procedure: An active timeout was performed and correct patient, name, & ID confirmed. Physicial exam was performed to ensure adequate perfusion.  Using sterile technique, an aterial line was inserted into the RIGHT Femoral artery artery.  Catheter threaded and the needle was removed with appropriate blood return.  Arterial waveform was noted.  After the procedure, the patient's extremities were observed to be pink and warm.   Estimated Blood Loss: None .   Number of Attempts: 1.   Complications: None .  Operator: Charlaine Utsey.   [name], M.D.  [facility]  Medical Director ICU-[facility] [place] Medical Director [facility] Cardio-Pulmonary Department      

## 2015-07-14 NOTE — Consult Note (Signed)
[facility] Consultation      Name: [name] MRN: [id number] DOB: 09-04-1946    ADMISSION DATE:  07/14/2015   CHIEF COMPLAINT:   Acute resp distress, unresponsiveness   HISTORY OF PRESENT ILLNESS   69 yo white female with multiple medical issues admitted to ICU for acute hypoxic resp failure and acute mental status changes, patient placed on 100% NRB mask, patient unresponsive, RAPID RESPONSE TEAM was called to evaluate, patient was immeditaely transferred to ICU, I artrived at bedside, patient with agonal respirations, striggling to breathe, using accessoy muscles to breathe, ABG shows metabolic acidosis and severe hypoxia PF ratio 62  Patient also with acute Left ischemic lower ext-Vascular surgery called-and was to be assess in AM for vasc procedure   SIGNIFICANT EVENTS   9/28 ICu admission  PAST MEDICAL HISTORY    :  Past Medical History  Diagnosis Date  . Peripheral vascular disease    Past Surgical History  Procedure Laterality Date  . Below knee leg amputation    . Stents left leg     Prior to Admission medications   Medication Sig Start Date End Date Taking? Authorizing Provider  aspirin 81 MG tablet Take 81 mg by mouth daily.   Yes Historical Provider, MD  famotidine (PEPCID) 20 MG tablet Take 20 mg by mouth daily.   Yes Historical Provider, MD  lisinopril-hydrochlorothiazide (PRINZIDE,ZESTORETIC) 20-12.5 MG tablet Take 1 tablet by mouth daily. 05/27/15  Yes Historical Provider, MD  magnesium hydroxide (MILK OF MAGNESIA) 400 MG/5ML suspension Take 30 mLs by mouth daily as needed for mild constipation.   Yes Historical Provider, MD  oxyCODONE-acetaminophen (PERCOCET/ROXICET) 5-325 MG per tablet Take 1 tablet by mouth every 4 (four) hours as needed for severe pain.   Yes Historical Provider, MD  warfarin (COUMADIN) 5 MG tablet Take 2.5-5 mg by mouth daily. Takes 2.74m on Monday, Wednesday and Friday. All other days take 563m  Yes  Historical Provider, MD   Allergies  Allergen Reactions  . Morphine And Related Diarrhea and Nausea And Vomiting     FAMILY HISTORY   No family history on file.    SOCIAL HISTORY    reports that she has been smoking.  She does not have any smokeless tobacco history on file. She reports that she does not drink alcohol or use illicit drugs.  Review of Systems  Unable to perform ROS: critical illness      VITAL SIGNS    Temp:  [97.1 F (36.2 C)-97.9 F (36.6 C)] 97.9 F (36.6 C) (09/28 1824) Pulse Rate:  [37-126] 125 (09/28 1824) Resp:  [10-24] 24 (09/28 1824) BP: (80-123)/(41-83) 80/58 mmHg (09/28 1824) SpO2:  [90 %-100 %] 94 % (09/28 1824) FiO2 (%):  [100 %] 100 % (09/28 1920) Weight:  [102 lb 1.6 oz (46.312 kg)-110 lb (49.896 kg)] 102 lb 1.6 oz (46.312 kg) (09/28 0820) HEMODYNAMICS:   VENTILATOR SETTINGS: Vent Mode:  [-] PRVC FiO2 (%):  [100 %] 100 % Set Rate:  [20 bmp] 20 bmp Vt Set:  [400 mL] 400 mL PEEP:  [5 cmH20] 5 cmH20 INTAKE / OUTPUT:  Intake/Output Summary (Last 24 hours) at 07/14/15 1948 Last data filed at 07/14/15 1828  Gross per 24 hour  Intake 3159.17 ml  Output    400 ml  Net 2759.17 ml       PHYSICAL EXAM   Physical Exam  Constitutional: She appears distressed.  HENT:  Head: Normocephalic and atraumatic.  Eyes: Pupils are  equal, round, and reactive to light. No scleral icterus.  Neck: Normal range of motion. Neck supple.  Cardiovascular: Normal rate and regular rhythm.   No murmur heard. Pulmonary/Chest: She is in respiratory distress. She has no wheezes. She has rales.  resp distress  Abdominal: Soft. She exhibits no distension. There is no tenderness.  Musculoskeletal: She exhibits no edema.  RT BKA, LEFT decreased Pulses  Neurological: She displays normal reflexes. Coordination normal.  gcs<8T  Skin: Skin is warm. No rash noted. She is diaphoretic.       LABS   LABS:  CBC  Recent Labs Lab 07/14/15 0410  WBC  32.0*  HGB 9.7*  HCT 30.8*  PLT 383   Coag's  Recent Labs Lab 07/14/15 0410  INR 3.38   BMET  Recent Labs Lab 07/14/15 0410  NA 135  K 3.4*  CL 99*  CO2 14*  BUN 71*  CREATININE 2.18*  GLUCOSE 195*   Electrolytes  Recent Labs Lab 07/14/15 0410  CALCIUM 8.6*   Sepsis Markers No results for input(s): LATICACIDVEN, PROCALCITON, O2SATVEN in the last 168 hours. ABG  Recent Labs Lab 07/14/15 1816  PHART 7.30*  PCO2ART 21*  PO2ART 62*   Liver Enzymes  Recent Labs Lab 07/14/15 0410  AST 91*  ALT 30  ALKPHOS 110  BILITOT 0.8  ALBUMIN 3.6   Cardiac Enzymes No results for input(s): TROPONINI, PROBNP in the last 168 hours. Glucose No results for input(s): GLUCAP in the last 168 hours.   Recent Results (from the past 240 hour(s))  Culture, blood (routine x 2)     Status: None (Preliminary result)   Collection Time: 07/14/15  6:53 AM  Result Value Ref Range Status   Specimen Description BLOOD RIGHT ANTECUBITAL  Final   Special Requests BOTTLES DRAWN AEROBIC AND ANAEROBIC 5CC  Final   Culture NO GROWTH <12 HOURS  Final   Report Status PENDING  Incomplete  Culture, blood (routine x 2)     Status: None (Preliminary result)   Collection Time: 07/14/15  6:53 AM  Result Value Ref Range Status   Specimen Description BLOOD RIGHT ANTECUBITAL  Final   Special Requests BOTTLES DRAWN AEROBIC AND ANAEROBIC 5CC  Final   Culture NO GROWTH <12 HOURS  Final   Report Status PENDING  Incomplete     Current facility-administered medications:  .  Place/Maintain arterial line, , , Until Discontinued **AND** 0.9 %  sodium chloride infusion, , Intra-arterial, PRN, Flora Lipps, MD .  0.9 % NaCl with KCl 20 mEq/ L  infusion, , Intravenous, Continuous, Nicholes Mango, MD, Last Rate: 125 mL/hr at 07/14/15 1411 .  aspirin chewable tablet 81 mg, 81 mg, Oral, Daily, Harrie Foreman, MD, 81 mg at 07/14/15 0844 .  famotidine (PEPCID) tablet 20 mg, 20 mg, Oral, Daily, Harrie Foreman, MD, 20 mg at 07/14/15 0844 .  fentaNYL 10 mcg/ml infusion, , , ,  .  fentaNYL 2553mg in NS 2591m(1030mml) infusion-PREMIX, 10 mcg/hr, Intravenous, Continuous, KurFlora LippsD .  heparin ADULT infusion 100 units/mL (25000 units/250 mL), 750 Units/hr, Intravenous, Continuous, RanGregor HamsD, Last Rate: 7.5 mL/hr at 07/14/15 0906, 750 Units/hr at 07/14/15 0906 .  lisinopril (PRINIVIL,ZESTRIL) tablet 20 mg, 20 mg, Oral, Daily, 20 mg at 07/14/15 0848 **AND** hydrochlorothiazide (MICROZIDE) capsule 12.5 mg, 12.5 mg, Oral, Daily, MicHarrie ForemanD, 12.5 mg at 07/14/15 0847 .  HYDROmorphone (DILAUDID) injection 0.5 mg, 0.5 mg, Intravenous, Q3H PRN, MicHarrie ForemanD, 0.5  mg at 07/14/15 1342 .  HYDROmorphone (DILAUDID) tablet 1 mg, 1 mg, Oral, Q3H PRN, Nicholes Mango, MD, 1 mg at 07/14/15 1430 .  labetalol (NORMODYNE,TRANDATE) injection 10 mg, 10 mg, Intravenous, Q2H PRN, Harrie Foreman, MD .  magnesium hydroxide (MILK OF MAGNESIA) suspension 30 mL, 30 mL, Oral, Daily PRN, Harrie Foreman, MD .  nicotine (NICODERM CQ - dosed in mg/24 hr) patch 7 mg, 7 mg, Transdermal, Daily, Harrie Foreman, MD, 7 mg at 07/14/15 0844 .  norepinephrine (LEVOPHED) 30m in D5W 2516mpremix infusion, 0-40 mcg/min, Intravenous, Titrated, Aruna Gouru, MD .  ondansetron (ZOFRAN) tablet 4 mg, 4 mg, Oral, Q6H PRN **OR** ondansetron (ZOFRAN) injection 4 mg, 4 mg, Intravenous, Q6H PRN, MiHarrie ForemanMD, 4 mg at 07/14/15 0646 .  oxyCODONE-acetaminophen (PERCOCET/ROXICET) 5-325 MG per tablet 1 tablet, 1 tablet, Oral, Q4H PRN, MiHarrie ForemanMD, 1 tablet at 07/14/15 1629 .  piperacillin-tazobactam (ZOSYN) IVPB 3.375 g, 3.375 g, Intravenous, Q12H, MiHarrie ForemanMD, 3.375 g at 07/14/15 1026 .  sodium chloride 0.9 % injection 3 mL, 3 mL, Intravenous, Q12H, MiHarrie ForemanMD, 3 mL at 07/14/15 1031 .  sterile water (preservative free) injection, , , ,   IMAGING    Dg Abd Portable  2v  07/14/2015   CLINICAL DATA:  Abdominal pain. Blood in stool. Peripheral vascular disease.  EXAM: PORTABLE ABDOMEN - 2 VIEW  COMPARISON:  None.  FINDINGS: No evidence of dilated bowel loops or free air. Endovascular stent seen throughout the iliac arteries bilaterally. Diffuse interstitial infiltrates seen throughout the visualized portions of the lungs suspicious for diffuse pulmonary edema.  IMPRESSION: Unremarkable bowel gas pattern.  Diffuse pulmonary interstitial infiltrates suspicious for edema.   Electronically Signed   By: JoEarle Gell.D.   On: 07/14/2015 18:35      Indwelling Urinary Catheter continued, requirement due to   Reason to continue Indwelling Urinary Catheter for strict Intake/Output monitoring for hemodynamic instability   Central Line continued, requirement due to   Reason to continue Ce[employer]inder Morgan Energyonitoring of central venous pressure or other hemodynamic parameters   Ventilator continued, requirement due to, resp failure    Ventilator Sedation RASS 0 to -2   MAJOR EVENTS/TEST RESULTS:    INDWELLING DEVICES:: 9/28 intubated 8.0 ETT>>> 9/28 Left IJ CVL>>> 9/28 RT FEM A LINE>>  MICRO DATA: MRSA PCR >> Urine  Blood Resp   ANTIMICROBIALS:  9/28 zosyn>>    ASSESSMENT/PLAN   [age]o white female with underlying severe PVD with acute hypoxic resp failure likely from acute sepsis and metabolic acidosis with acute renal failure with acute left leg ischemia  PULMONARY -Respiratory Failure -continue Full MV support -continue Bronchodilator Therapy -Wean Fio2 and PEEP as tolerated -will perform SAT/SBt when respiratory parameters are met   CARDIOVASCULAR MAP>65 -a line placed -hold fluids for now  RENAL -Acute renal failure from ATN -follow UO, chem7 -may need HD -follow nephrology consult  GASTROINTESTINAL OG placed -keep NPO for potential procedure  HEMATOLOGIC Follow CBC  INFECTIOUS -empiric abx -blood cultures  ENDOCRINE follllow  FSBS  NEUROLOGIC - intubated and sedated - minimal sedation to achieve a RASS goal: -1 -use fentanyl infusion   EXT_ischemic lower ext -follow up vasc surgery recs -on heparin infusion   I have personally obtained a history, examined the patient, evaluated laboratory and independently reviewed  imaging results, formulated the assessment and plan and placed orders.  The Patient requires high complexity decision making for assessment and  support, frequent evaluation and titration of therapies, application of advanced monitoring technologies and extensive interpretation of multiple databases. Critical Care Time devoted to patient care services described in this note is 55 minutes.   Overall, patient is critically ill, prognosis is guarded. Patient at high risk for cardiac arrest and death. Patient with multiorgan failure. Family updated Will assess daily for improvement   Corrin Parker, M.D.  [facility]  Medical Director [place] Director [facility] Cardio-Pulmonary Department

## 2015-07-14 NOTE — Consult Note (Signed)
[facility] Vascular Consult Note  MRN : [id number]  [name] is a 69 y.o. (19-Dec-1945) female who presents with chief complaint of  Chief Complaint  Patient presents with  . Leg Pain  .  History of Present Illness: Patient presents to the emergency room with severe pain in her left leg. This started late last night and within a couple of hours she presented to the emergency room for further evaluation. Prior to that, she was getting around and learning to walk with assistance after having had an amputation on the right leg for severe peripheral vascular disease. She has had extensive interventions to both lower extremities previously. She has had chronic ulcerations on her left fifth toe and left heel for months that have been slow to heal. She has a long history of tobacco dependence and has tried to stop smoking without complete success. The pain is severe, 10 out of 10, intense in the lower part of the leg with no improvement with any medications thus far. She was also found to have markedly reduced renal function on her labs today and a markedly elevated white blood cell count. She has not had any recent fever or chills that she knows of. There were no clear inciting events or causative factors that precipitated the acute exacerbation last night to her knowledge.  Current Facility-Administered Medications  Medication Dose Route Frequency Provider Last Rate Last Dose  . HYDROmorphone (DILAUDID) injection 0.5 mg  0.5 mg Intravenous Q3H PRN Arnaldo Natal, MD   0.5 mg at 07/14/15 7829  . ondansetron (ZOFRAN) tablet 4 mg  4 mg Oral Q6H PRN Arnaldo Natal, MD       Or  . ondansetron [facility]) injection 4 mg  4 mg Intravenous Q6H PRN Arnaldo Natal, MD   4 mg at 07/14/15 [phone]  . sodium chloride 0.9 % bolus 1,000 mL  1,000 mL Intravenous Once Annice Needy, MD       Current Outpatient Prescriptions  Medication Sig Dispense Refill  . aspirin 81 MG tablet  Take 81 mg by mouth daily.    . famotidine (PEPCID) 20 MG tablet Take 20 mg by mouth daily.    Marland Kitchen lisinopril-hydrochlorothiazide (PRINZIDE,ZESTORETIC) 20-12.5 MG tablet Take 1 tablet by mouth daily.  1  . magnesium hydroxide (MILK OF MAGNESIA) 400 MG/5ML suspension Take 30 mLs by mouth daily as needed for mild constipation.    Marland Kitchen oxyCODONE-acetaminophen (PERCOCET/ROXICET) 5-325 MG per tablet Take 1 tablet by mouth every 4 (four) hours as needed for severe pain.    Marland Kitchen warfarin (COUMADIN) 5 MG tablet Take 2.5-5 mg by mouth daily. Takes 2.5mg  on Monday, Wednesday and Friday. All other days take       Past Medical History  Diagnosis Date  . Peripheral vascular disease     Past Surgical History  Procedure Laterality Date  . Below knee leg amputation    . Stents left leg      Social History Social History  Substance Use Topics  . Smoking status: Current Some Day Smoker  . Smokeless tobacco: None  . Alcohol Use: No  Married, lives with husband  Family History No history of bleeding disorders, clotting disorders, or autoimmune diseases  Allergies  Allergen Reactions  . Morphine And Related Diarrhea and Nausea And Vomiting     REVIEW OF SYSTEMS (Negative unless checked)  Constitutional: Weight loss  Fever  Chills Cardiac: Chest pain   Chest pressure   Palpitations     Shortness of breath when laying flat   Shortness of breath at rest   Shortness of breath with exertion. Vascular:  Pain in legs with walking   Pain in legs at rest   Pain in legs when laying flat   Claudication   Pain in feet when walking  Pain in feet at rest  Pain in feet when laying flat   History of DVT   Phlebitis   Swelling in legs   Varicose veins   Non-healing ulcers Pulmonary:   Uses home oxygen   Productive cough   Hemoptysis   Wheeze  COPD   Asthma Neurologic:  Dizziness  Blackouts   Seizures   History of stroke   History of TIA  Aphasia    Temporary blindness   Dysphagia   Weakness or numbness in arms   Weakness or numbness in legs Musculoskeletal:  Arthritis   Joint swelling   Joint pain   Low back pain Hematologic:  Easy bruising  Easy bleeding   Hypercoagulable state   Anemic  Hepatitis Gastrointestinal:  Blood in stool   Vomiting blood  Gastroesophageal reflux/heartburn   Difficulty swallowing. Genitourinary:  Chronic kidney disease   Difficult urination  Frequent urination  Burning with urination   Blood in urine Skin:  Rashes   Ulcers   Wounds Psychological:  History of anxiety    History of major depression.  Physical Examination  Filed Vitals:   07/14/15 0615 07/14/15 0630 07/14/15 0645 07/14/15 0715  BP: 102/69 114/83 112/83 105/74  Pulse:   123   Temp:      TempSrc:      Resp: Height:      Weight:      SpO2:   100%    Body mass index is 18.31 kg/(m^2). Gen:  Thin, appears in pain Head: [place]/AT, No temporalis wasting. Prominent temp pulse not noted. Ear/Nose/Throat: Hearing grossly intact, nares w/o erythema or drainage, oropharynx w/o Erythema/Exudate Eyes: PERRLA, EOMI.  Neck: Supple, no nuchal rigidity.  No bruit or JVD.  Pulmonary:  Good air movement, clear to auscultation bilaterally.  Cardiac: tachycardic but regular Vascular:  Vessel Right Left  Radial Palpable Palpable  Ulnar Palpable Palpable  Brachial Palpable Palpable  Carotid Palpable, without bruit Palpable, without bruit  Aorta Not palpable N/A  Femoral Not Palpable Not Palpable  Popliteal Not palpable Not Palpable  PT (Amputation) Not Palpable  DP (Amputation) Not Palpable   Gastrointestinal: soft, non-tender/non-distended. No guarding/reflex. No masses, surgical incisions, or scars. Musculoskeletal:   Right AKA. Left foot cold and there are appear to be irreversible skin changes near the fifth toe.  Clearly ischemic.  Has ulceration on the fifth toe and the heel that  are chronic. Neurologic: CN 2-12 intact.  Symmetrical.  Speech is fluent.  Psychiatric: Judgment intact, Mood & affect appropriate for pt's clinical situation. Dermatologic: fibrinous exudate on 3 cm left heel ulcer.  <1cm left fifth toe ulceration. Skin with ischemic changes in left foot and lower leg.  Right leg amputation well healed Lymph : No Cervical, Axillary, or Inguinal lymphadenopathy.      CBC Lab Results  Component Value Date   WBC 32.0* 07/14/2015   HGB 9.7* 07/14/2015   HCT 30.8* 07/14/2015   MCV 89.4 07/14/2015   PLT 383 07/14/2015    BMET    Component Value Date/Time   NA 135 07/14/2015 0410   NA 131* 02/13/2015 0426   K 3.4* 07/14/2015 0410   K  3.0* 02/13/2015 0426   CL 99* 07/14/2015 0410   CL 103 02/13/2015 0426   CO2 14* 07/14/2015 0410   CO2 23 02/13/2015 0426   GLUCOSE 195* 07/14/2015 0410   GLUCOSE 104* 02/13/2015 0426   BUN 71* 07/14/2015 0410   BUN 10 02/13/2015 0426   CREATININE 2.18* 07/14/2015 0410   CREATININE 0.83 02/13/2015 0426   CALCIUM 8.6* 07/14/2015 0410   CALCIUM 7.3* 02/13/2015 0426   GFRNONAA 22* 07/14/2015 0410   GFRNONAA >60 02/13/2015 0426   GFRNONAA 51* 11/08/2014 0618   GFRAA 25* 07/14/2015 0410   GFRAA >60 02/13/2015 0426   GFRAA >60 11/08/2014 0618   Estimated Creatinine Clearance: 18.9 mL/min (by C-G formula based on Cr of 2.18).  COAG Lab Results  Component Value Date   INR 3.38 07/14/2015   INR 1.2 02/12/2015   INR 1.3 02/03/2015    Radiology No results found.    Assessment/Plan 1. Acute on chronic ischemia LLE with rest pain.  Clearly limb threatening situation with chronic ulceration but acute pain last night.  Patient has severe disease and is already s/p amputation on the right.  The other medical issues are going to make this much harder to save her leg.  Will need angio, but with ARF, this would be very dangerous to perform today.  Patient understands this and we will follow.  Continue heparin  drip 2. PAD with ulceration of left heel and left fifth toe.  This has been a chronic situation, and now there is an acute exacerbation.  With her ulcerations and a WBC of 32K, I suspect some infection is present and her heel is the likely source.  There is fibrinous exudate present.  Would consider IV ABx but will have to be cautious with renal function. 3. Ulceration of left heel and fifth toe.  Infection is suspected as well with WBC 32K.  Very poor situation and risk of limb loss is extremely high 4. ARF. Likely from many factors but appears dehydrated clinically and the infection and ischemia may be contributing as well.  This dramatically worsens her situation as any angiogram would risk progression of her renal failure to dialysis with dye and thrombectomy device.  With her situation as it is, will hold on angiogram for now.  If renal function improves, will consider angiogram tomorrow or Friday, but the longer we wait the less likely it is we can save her leg.  Very poor situation.   Have ordered a saline bolus as BP about 90/50 and HR about 120 in ER so clearly prerenal and will need aggressive hydration over next day or two to hope to improve her renal function enough to be able to do the angiogram.   5. Tobacco dependence.  The major underlying cause of her PAD.  She knows she needs to stop, but this has been very difficult for her.   6. Severe leukocytosis.  Likely infection from left foot infection.  Also may be elevated with dehydration.   Overall this is a very difficult and morbid situation. She is at extremely high risk of limb loss and at this point she has a high risk of limb loss even if we are able to get successful revascularization. The worse case scenario may be performance of an angiogram today creating complete renal failure that requires dialysis and still not salvaging her leg. She understands this. I would recommend she be admitted, kept on a heparin drip, aggressively hydrated  over the next 24 hours,  and we will reassess her renal function in the morning tomorrow to see whether or not she could have an angiogram safely. If her renal function does not improve in the next couple of days, I do not think that will be much chance of limb salvage. She and her daughter voiced their understanding of this very difficult situation.   Kennis Buell, MD  07/14/2015 7:51 AM

## 2015-07-14 NOTE — Progress Notes (Signed)
ANTIBIOTIC CONSULT NOTE - INITIAL  Pharmacy Consult for Zosyn Indication: pneumonia (CAP), possible LLE ulceration/infection  Allergies  Allergen Reactions  . Morphine And Related Diarrhea and Nausea And Vomiting    Patient Measurements: Height:  (165.1 cm) Weight: 102 lb 1.6 oz (46.312 kg) IBW/kg (Calculated) : 57   Vital Signs: Temp: 97.8 F (36.6 C) (09/28 0820) Temp Source: Oral (09/28 0820) BP: 93/61 mmHg (09/28 0820) Pulse Rate: 115 (09/28 0820) Intake/Output from previous day:   Intake/Output from this shift:    Labs:  Recent Labs  07/14/15 0410  WBC 32.0*  HGB 9.7*  PLT 383  CREATININE 2.18*   Estimated Creatinine Clearance: 17.6 mL/min (by C-G formula based on Cr of 2.18). No results for input(s): VANCOTROUGH, VANCOPEAK, VANCORANDOM, GENTTROUGH, GENTPEAK, GENTRANDOM, TOBRATROUGH, TOBRAPEAK, TOBRARND, AMIKACINPEAK, AMIKACINTROU, AMIKACIN in the last 72 hours.   Microbiology: Recent Results (from the past 720 hour(s))  Culture, blood (routine x 2)     Status: None (Preliminary result)   Collection Time: 07/14/15  6:53 AM  Result Value Ref Range Status   Specimen Description BLOOD RIGHT ANTECUBITAL  Final   Special Requests BOTTLES DRAWN AEROBIC AND ANAEROBIC 5CC  Final   Culture NO GROWTH <12 HOURS  Final   Report Status PENDING  Incomplete  Culture, blood (routine x 2)     Status: None (Preliminary result)   Collection Time: 07/14/15  6:53 AM  Result Value Ref Range Status   Specimen Description BLOOD RIGHT ANTECUBITAL  Final   Special Requests BOTTLES DRAWN AEROBIC AND ANAEROBIC 5CC  Final   Culture NO GROWTH <12 HOURS  Final   Report Status PENDING  Incomplete    Medical History: Past Medical History  Diagnosis Date  . Peripheral vascular disease      Assessment: 69 yo female here with LLE arterial occlusion starting Zosyn for CAP, and to cover possible LLE source.   Goal of Therapy:  Resolution of infection  Plan:  Will order  Zosyn 3.375 g IV q12h EI based on current renal function (CrCl<20 ml/min)   Crist Fat L 07/14/2015,8:40 AM

## 2015-07-14 NOTE — Progress Notes (Addendum)
Dr Amado Coe called back and made aware of patients vitals and status. RN expressed concern about patients condition and need for closer monitoring, possibly stepdown. MD gave verbal order for one more 1L bolus, insert and maintain foley catheter. Per MD, if patient BP still low, will transfer to step down.

## 2015-07-14 NOTE — Procedures (Signed)
Endotracheal Intubation: Patient required placement of an artificial airway secondary to resp failure.   Consent: Emergent.   Hand washing performed prior to starting the procedure.   Medications administered for sedation prior to procedure: Midazolam 2 mg IV,  Vecuronium 10 mg IV, Fentanyl 100 mcg IV.   Procedure: A time out procedure was called and correct patient, name, & ID confirmed. Needed supplies and equipment were assembled and checked to include ETT, 10 ml syringe, Glidescope, Mac and Miller blades, suction, oxygen and bag mask valve, end tidal CO2 monitor. Patient was positioned to align the mouth and pharynx to facilitate visualization of the glottis.  Heart rate, SpO2 and blood pressure was continuously monitored during the procedure. Pre-oxygenation was conducted prior to intubation and endotracheal tube was placed through the vocal cords into the trachea.  During intubation an assistant applied gentle pressure to the cricoid cartilage.   The artificial airway was placed under direct visualization via glidescope route using a 8.0 ETT on the first attempt.    ETT was secured at 24 cm mark.    Placement was confirmed by auscuitation of lungs with good breath sounds bilaterally and no stomach sounds.  Condensation was noted on endotracheal tube.  Pulse ox %.  CO2 detector in place with appropriate color change.   Complications: None .   Operator: Namish Krise.   Chest radiograph ordered and pending.   Comments: OGT placed via glidescope.  Walterine Amodei David Aubriee Szeto, M.D.  [facility]  Medical Director ICU-[facility] [place] Medical Director [facility]       

## 2015-07-14 NOTE — Progress Notes (Signed)
ANTICOAGULATION CONSULT NOTE - Initial Consult  Pharmacy Consult for DVT Indication: DVT  Allergies  Allergen Reactions  . Morphine And Related Diarrhea and Nausea And Vomiting    Patient Measurements: Height:  (165.1 cm) Weight: 110 lb (49.896 kg) IBW/kg (Calculated) : 57 Heparin Dosing Weight: 49.9 kg  Vital Signs: Temp: 97.1 F (36.2 C) (09/28 0412) Temp Source: Oral (09/28 0412) BP: 109/81 mmHg (09/28 0430) Pulse Rate: 115 (09/28 0412)  Labs:  Recent Labs  07/14/15 0410  HGB 9.7*  HCT 30.8*  PLT 383  LABPROT 34.2*  INR 3.38  CREATININE 2.18*    Estimated Creatinine Clearance: 18.9 mL/min (by C-G formula based on Cr of 2.18).   Medical History: Past Medical History  Diagnosis Date  . Peripheral vascular disease     Medications:  Infusions:    Assessment: 69 yof currently on Coumadin with INR 3.38 developed LLE DVT, skin cool to touch per RN note. Vascular consult placed. Spoke with ED physician, wants to start heparin now even though INR therapeutic because LLE clotted thorough therapeutic VKA. Agreed no bolus, lower initial rate.  Goal of Therapy:  Heparin level 0.3-0.7 units/ml Monitor platelets by anticoagulation protocol: Yes   Plan:  Start heparin infusion at 800 units/hr Check anti-Xa level in 8 hours and daily while on heparin Continue to monitor H&H and platelets  Carola Frost, Pharm.D.  Clinical Pharmacist 07/14/2015,4:46 AM

## 2015-07-14 NOTE — Progress Notes (Addendum)
RN spoke with Dr. Levada Schilling and made him aware of critical lactic acid and asked about verifying central line, ETT, and OG tube placements? MD stated "the central line is good to use and you can use the OG tube." MD stated "she needs a CBC and CVP reading too." RN also made MD aware that patient's cuff pressure had just dropped to 70's systolically but arterial pressure still has MAP of 65. Dr. Levada Schilling stated "i would go off the arterial pressure, it is more accurate."

## 2015-07-14 NOTE — Progress Notes (Signed)
ANTICOAGULATION CONSULT NOTE - Initial Consult  Pharmacy Consult for DVT Indication: DVT  Allergies  Allergen Reactions  . Morphine And Related Diarrhea and Nausea And Vomiting    Patient Measurements: Height:  (165.1 cm) Weight: 102 lb 1.6 oz (46.312 kg) IBW/kg (Calculated) : 57 Heparin Dosing Weight: 49.9 kg-->46.3 kg  Vital Signs: Temp: 97.7 F (36.5 C) (09/28 1101) Temp Source: Oral (09/28 1101) BP: 80/44 mmHg (09/28 1635) Pulse Rate: 125 (09/28 1635)  Labs:  Recent Labs  07/14/15 0410 07/14/15 1601  HGB 9.7*  --   HCT 30.8*  --   PLT 383  --   LABPROT 34.2*  --   INR 3.38  --   HEPARINUNFRC  --  0.52  CREATININE 2.18*  --     Estimated Creatinine Clearance: 17.6 mL/min (by C-G formula based on Cr of 2.18).   Medical History: Past Medical History  Diagnosis Date  . Peripheral vascular disease     Medications:  Infusions:  . 0.9 % NaCl with KCl 20 mEq / L 125 mL/hr at 07/14/15 1411  . heparin 750 Units/hr (07/14/15 0906)    Assessment: 69 yof currently on Coumadin with INR 3.38 developed LLE DVT, skin cool to touch per RN note. Vascular consult placed. Spoke with ED physician, wants to start heparin now even though INR therapeutic because LLE clotted thorough therapeutic VKA. Agreed no bolus, lower initial rate.  Goal of Therapy:  Heparin level 0.3-0.7 units/ml Monitor platelets by anticoagulation protocol: Yes   Plan:  Start heparin infusion at 800 units/hr Check anti-Xa level in 8 hours and daily while on heparin Continue to monitor H&H and platelets    Addendum: Heparin drip stopped per RN at 0820, looked like order was d/c'd (put in as once order). Spoke with Dr. Amado Coe, continue heparin drip at this time.  Change in wt from 49.9 to 46.3 kg, will decrease heparin infusion rate to 750 units/hr (=7.5 ml/hr) as INR therapeutic to shoot for 16 units/kg/hr dosing.  CBC and INR ordered for AM.  Will retime heparin level for 1600 today.    9/28:   HL @ 16:00 = 0.52  Will continue pt on current rate of 750 units/hr and recheck HL in 8 hrs on 9/29 @ 00:00.   Faviola Klare D, Pharm.D.  Clinical Pharmacist 07/14/2015,5:18 PM

## 2015-07-14 NOTE — Progress Notes (Addendum)
Dr. Amado Coe paged. Pt 2L bolus complete (total of 3L today), bp still low, HR elevated, minimal urine output since admission to the floor, bladder scan shows 655cc, pt has no urge to void. Pain still 10/10 pain, pt moaning and rolling around in bed. Awaiting call back.  4:50 PM Dr Amado Coe paged again

## 2015-07-14 NOTE — Progress Notes (Signed)
Pt. admitted to unit, rm 233. Oriented to room, call bell, Ascom phones and staff. Bed in low position. Fall safety plan reviewed, contract signed and placed on wall, pt declined yellow non-skid socks, bed alarm on. Full assessment to Epic, doppler unable to locate popliteal and dorsalis pedis pulses, L lower extremity cold to touch; MD aware. Telemetry verified with tele clerk. Skin assessed with Shana Chute., RN. Pt admitted to stage II pressure ulcer to L heel. Will continue to monitor.

## 2015-07-14 NOTE — ED Notes (Signed)
Pt arrives via EMS from home. Pt c/o left leg pain starting last night at 11pm. Pt AAOX4. NAD noted. RR even and nonlabored. Pt moaning in pain at this time. Dr. Manson Passey at bedside for eval. LLE skin cool.

## 2015-07-14 NOTE — Progress Notes (Signed)
ANTICOAGULATION CONSULT NOTE - Initial Consult  Pharmacy Consult for DVT Indication: DVT  Allergies  Allergen Reactions  . Morphine And Related Diarrhea and Nausea And Vomiting    Patient Measurements: Height:  (165.1 cm) Weight: 102 lb 1.6 oz (46.312 kg) IBW/kg (Calculated) : 57 Heparin Dosing Weight: 49.9 kg-->46.3 kg  Vital Signs: Temp: 97.8 F (36.6 C) (09/28 0820) Temp Source: Oral (09/28 0820) BP: 93/61 mmHg (09/28 0820) Pulse Rate: 115 (09/28 0820)  Labs:  Recent Labs  07/14/15 0410  HGB 9.7*  HCT 30.8*  PLT 383  LABPROT 34.2*  INR 3.38  CREATININE 2.18*    Estimated Creatinine Clearance: 17.6 mL/min (by C-G formula based on Cr of 2.18).   Medical History: Past Medical History  Diagnosis Date  . Peripheral vascular disease     Medications:  Infusions:  . 0.9 % NaCl with KCl 40 mEq / L    . heparin      Assessment: 69 yof currently on Coumadin with INR 3.38 developed LLE DVT, skin cool to touch per RN note. Vascular consult placed. Spoke with ED physician, wants to start heparin now even though INR therapeutic because LLE clotted thorough therapeutic VKA. Agreed no bolus, lower initial rate.  Goal of Therapy:  Heparin level 0.3-0.7 units/ml Monitor platelets by anticoagulation protocol: Yes   Plan:  Start heparin infusion at 800 units/hr Check anti-Xa level in 8 hours and daily while on heparin Continue to monitor H&H and platelets    Addendum: Heparin drip stopped per RN at 0820, looked like order was d/c'd (put in as once order). Spoke with Dr. Amado Coe, continue heparin drip at this time.  Change in wt from 49.9 to 46.3 kg, will decrease heparin infusion rate to 750 units/hr (=7.5 ml/hr) as INR therapeutic to shoot for 16 units/kg/hr dosing.  CBC and INR ordered for AM.  Will retime heparin level for 1600 today.   Marty Heck, Pharm.D.  Clinical Pharmacist 07/14/2015,9:05 AM

## 2015-07-14 NOTE — Progress Notes (Signed)
Patient still having a fair bit of pain. Blood pressure remains reasonably low. Has gotten two fluid boluses. Have discussed the case with primary service and this is a very difficult and complex situation. We're going to hydrate her overnight and if her renal function is better, we will perform an angiogram tomorrow. If we are unable to perform an angiogram due to renal dysfunction in the next day or 2, her leg will not be salvageable. I am not sure her leg is salvageable now, but she would like to try to save this if at all possible. Continue aggressive hydration. Continue antibiotics in the form of Zosyn for likely infected ulcerations of the lower extremities. We'll reassess tomorrow morning for angiography.

## 2015-07-14 NOTE — Progress Notes (Signed)
   07/14/15 1745  Clinical Encounter Type  Visited With Family  Visit Type Initial;Spiritual support  Referral From Nurse  Consult/Referral To Chaplain  Responded to a Rapid Response to room 233. Patient in distress-moved to CCU. Offered pastoral care to family.

## 2015-07-14 NOTE — Progress Notes (Signed)
Consent for tomorrows procedure has been signed and placed in patients chart. Daughter at bedside, aware of time of procedure tomorrow AM.

## 2015-07-14 NOTE — Care Management (Signed)
Patient presents from home with severe pain left leg.  She has had previous stents in this leg and prior BKA of right leg.  Upon presentation limb was blue and without a pulse.  Has been assessed by vascular and will need angiogram but kidney function is preventing this procedure at present.  Patient is on heparin drip

## 2015-07-14 NOTE — H&P (Signed)
[name] is an 69 y.o. female.   Chief Complaint: Leg pain HPI: The patient complains of LLE pain that began began approximately 6 hours prior to admission.  PMH significant for R BKA and multiple stents placed to left leg.  On physical exam she was found to have a L leg that was blue, pulseless, and painful. She denies fevers but admits to nausea and vomiting x1.  NBNB.  Her symptoms are concerning for arterial thrombus which prompted the ED staff to call for addmission.  Past Medical History  Diagnosis Date  . Peripheral vascular disease     Past Surgical History  Procedure Laterality Date  . Below knee leg amputation    . Stents left leg      No family history on file. None Social History:  reports that she has been smoking.  She does not have any smokeless tobacco history on file. She reports that she does not drink alcohol or use illicit drugs.  Allergies:  Allergies  Allergen Reactions  . Morphine And Related Diarrhea and Nausea And Vomiting    Prior to Admission medications   Medication Sig Start Date End Date Taking? Authorizing Provider  aspirin 81 MG tablet Take 81 mg by mouth daily.   Yes Historical Provider, MD  famotidine (PEPCID) 20 MG tablet Take 20 mg by mouth daily.   Yes Historical Provider, MD  lisinopril-hydrochlorothiazide (PRINZIDE,ZESTORETIC) 20-12.5 MG tablet Take 1 tablet by mouth daily. 05/27/15  Yes Historical Provider, MD  magnesium hydroxide (MILK OF MAGNESIA) 400 MG/5ML suspension Take 30 mLs by mouth daily as needed for mild constipation.   Yes Historical Provider, MD  oxyCODONE-acetaminophen (PERCOCET/ROXICET) 5-325 MG per tablet Take 1 tablet by mouth every 4 (four) hours as needed for severe pain.   Yes Historical Provider, MD  warfarin (COUMADIN) 5 MG tablet Take 2.5-5 mg by mouth daily. Takes 2.63m on Monday, Wednesday and Friday. All other days take 518m  Yes Historical Provider, MD     Results for orders placed or performed during the  hospital encounter of 07/14/15 (from the past 48 hour(s))  CBC     Status: Abnormal   Collection Time: 07/14/15  4:10 AM  Result Value Ref Range   WBC 32.0 (H) 3.6 - 11.0 K/uL   RBC 3.44 (L) 3.80 - 5.20 MIL/uL   Hemoglobin 9.7 (L) 12.0 - 16.0 g/dL   HCT 30.8 (L) 35.0 - 47.0 %   MCV 89.4 80.0 - 100.0 fL   MCH 28.1 26.0 - 34.0 pg   MCHC 31.4 (L) 32.0 - 36.0 g/dL   RDW 16.2 (H) 11.5 - 14.5 %   Platelets 383 150 - 440 K/uL  Comprehensive metabolic panel     Status: Abnormal   Collection Time: 07/14/15  4:10 AM  Result Value Ref Range   Sodium 135 135 - 145 mmol/L   Potassium 3.4 (L) 3.5 - 5.1 mmol/L   Chloride 99 (L) 101 - 111 mmol/L   CO2 14 (L) 22 - 32 mmol/L   Glucose, Bld 195 (H) 65 - 99 mg/dL   BUN 71 (H) 6 - 20 mg/dL   Creatinine, Ser 2.18 (H) 0.44 - 1.00 mg/dL   Calcium 8.6 (L) 8.9 - 10.3 mg/dL   Total Protein 6.9 6.5 - 8.1 g/dL   Albumin 3.6 3.5 - 5.0 g/dL   AST 91 (H) 15 - 41 U/L   ALT 30 14 - 54 U/L   Alkaline Phosphatase 110 38 - 126 U/L  Total Bilirubin 0.8 0.3 - 1.2 mg/dL   GFR calc non Af Amer 22 (L) >60 mL/min   GFR calc Af Amer 25 (L) >60 mL/min    Comment: (NOTE) The eGFR has been calculated using the CKD EPI equation. This calculation has not been validated in all clinical situations. eGFR's persistently <60 mL/min signify possible Chronic Kidney Disease.    Anion gap 22 (H) 5 - 15  Protime-INR     Status: Abnormal   Collection Time: 07/14/15  4:10 AM  Result Value Ref Range   Prothrombin Time 34.2 (H) 11.4 - 15.0 seconds   INR 3.38    No results found.  Review of Systems  Constitutional: Negative for fever and chills.  HENT: Negative for sore throat and tinnitus.   Eyes: Negative for blurred vision and redness.  Respiratory: Negative for cough and shortness of breath.   Cardiovascular: Negative for chest pain, palpitations, orthopnea and PND.  Gastrointestinal: Negative for nausea, vomiting, abdominal pain and diarrhea.  Genitourinary: Negative  for dysuria, urgency and frequency.  Musculoskeletal: Negative for myalgias and joint pain.       LLE pain  Skin: Negative for rash.       No lesions  Neurological: Negative for speech change, focal weakness and weakness.  Endo/Heme/Allergies: Does not bruise/bleed easily.       No temperature intolerance  Psychiatric/Behavioral: Negative for depression and suicidal ideas.    Blood pressure 97/73, pulse 115, temperature 97.1 F (36.2 C), temperature source Oral, resp. rate 18, height 5' 5"  (1.651 m), weight 49.896 kg (110 lb), SpO2 98 %. Physical Exam  Nursing note and vitals reviewed. Constitutional: She is oriented to person, place, and time. She appears well-developed and well-nourished. No distress.  HENT:  Head: Normocephalic and atraumatic.  Mouth/Throat: Oropharynx is clear and moist.  Eyes: EOM are normal. Pupils are equal, round, and reactive to light. No scleral icterus.  Neck: Normal range of motion. Neck supple. No JVD present. No tracheal deviation present. No thyromegaly present.  Cardiovascular: Normal rate, regular rhythm and normal heart sounds.  Exam reveals no gallop and no friction rub.   No murmur heard. Respiratory: Effort normal and breath sounds normal.  GI: Soft. Bowel sounds are normal. She exhibits no distension. There is no tenderness.  Genitourinary:  Deferred  Musculoskeletal: Normal range of motion. She exhibits tenderness. She exhibits no edema.       Left ankle: She exhibits abnormal pulse.  R BKA; LLE cold and pulseless  Lymphadenopathy:    She has no cervical adenopathy.  Neurological: She is alert and oriented to person, place, and time. No cranial nerve deficit. She exhibits normal muscle tone.  Skin: Skin is warm and dry. No rash noted. No erythema.  Psychiatric: She has a normal mood and affect. Her behavior is normal. Judgment and thought content normal.     Assessment/Plan This is a 69 year old female admitted for arterial thrombus of the  LLE.  1. Arterial occlusion: LLE pulseless, dusky, and painful.  H/o stents to extremity.  Cont heparin drip.  Vascular surgery consult placed. 2. Leukocytosis: likely secondary to poorly perfused leg. Meets criteria for sepsis due to WBC, tachycardia and tachypnea.  Blood cultures obtained.  Lower extremity is likely source, however the patient also has a LLL infiltrate concerning for pneumonia.  Add Zosyn.   3. Pneumonia: community-acquired; antibiotics as above.  Supplemental O2 as needed 4. 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  IVF; avoid nephrotoxic  agents.  Differential diagnosis includes thrombus that is decreasing blood for to renal arteries. 5. Tobacco abuse: Nicoderm patch 6. DVT pxs: as above 7. GI pxs: none The patient is a full code.  Time spent on patient care and admission orders approximately 35 minutes.   Harrie Foreman 07/14/2015, 5:53 AM

## 2015-07-14 NOTE — Progress Notes (Signed)
Rapid Response Event Note  Overview:  Rapid response called d/t low BP, o2 sats dropping. Pt has received total of 3 boluses with 4th infusing and BP has not resolved. Minimal urine output during the day, foley inserted with urine return, approx. 400cc drained. Pt requiring 4L o2  to maintain sats at 89%.     Initial Focused Assessment: Pt pale and diaphoretic, complaining of abdominal pain (abd xray results pending), left leg and hip pain. Patient is oriented but alertness has decreased as pain and low BP have sustained.    Interventions: 4L o2 applied, 4th bolus started (per dr gouru order), dr Amado Coe notified of rapid response Patient transferred to ccu15, ccu consult added and dr. Amado Coe gave orders for levophed drip.  Reference previous notes about RNs concern with patients condition.    Event Summary:   rapid response called at 1741       dr gouru paged 1753       pt transferred to ccu15 at 1800     Venetia Prewitt, Reather Littler

## 2015-07-14 NOTE — Progress Notes (Addendum)
Dr. Amado Coe updated on patients BP, told to hold BP medications for today and increase IVF to 135ml/hr. Pt can go on a heart healthy diet per dr Amado Coe.

## 2015-07-14 NOTE — ED Provider Notes (Signed)
[facility] Emergency Department Provider Note  ____________________________________________  Time seen: 3:50 AM  I have reviewed the triage vital signs and the nursing notes.   HISTORY  Chief Complaint Leg Pain      HPI [name] is a 69 y.o. female resents with acute onset of left leg pain at 11:00 PM. 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 In addition patient has multiple vascular stents in her left leg at present Patient is followed by Dr. Gilda Crease vascular surgeon. Patient denies any chest pain no shortness of breath. Current pain score is 10 out of 10 and worse with any movement alleviating factors.    Past Medical History  Diagnosis Date  . Peripheral vascular disease     Patient Active Problem List   Diagnosis Date Noted  . Pneumonia 02/13/2015    Past Surgical History  Procedure Laterality Date  . Below knee leg amputation    . Stents left leg      Current Outpatient Rx  Name  Route  Sig  Dispense  Refill  . apixaban (ELIQUIS) 5 MG TABS tablet   Oral   Take 5 mg by mouth daily.         Marland Kitchen aspirin 81 MG tablet   Oral   Take 81 mg by mouth daily.         . famotidine (PEPCID) 20 MG tablet   Oral   Take 20 mg by mouth daily.         . magnesium hydroxide (MILK OF MAGNESIA) 400 MG/5ML suspension   Oral   Take 30 mLs by mouth daily as needed for mild constipation.         Marland Kitchen oxyCODONE-acetaminophen (PERCOCET/ROXICET) 5-325 MG per tablet   Oral   Take 1 tablet by mouth every 4 (four) hours as needed for severe pain.         Marland Kitchen warfarin (COUMADIN) 5 MG tablet   Oral   Take 5 mg by mouth daily.           Allergies Morphine and related  No family history on file.  Social History Social History  Substance Use Topics  . Smoking status: Current Some Day Smoker  . Smokeless tobacco: None  . Alcohol Use: No    Review of  Systems  Constitutional: Negative for fever. Eyes: Negative for visual changes. ENT: Negative for sore throat. Cardiovascular: Negative for chest pain. Respiratory: Negative for shortness of breath. Gastrointestinal: Negative for abdominal pain, vomiting and diarrhea. Genitourinary: Negative for dysuria. Musculoskeletal: Negative for back pain. Positive for left leg pain Skin: Negative for rash. Neurological: Negative for headaches, focal weakness or numbness.   10-point ROS otherwise negative.  ____________________________________________   PHYSICAL EXAM:  VITAL SIGNS: ED Triage Vitals  Enc Vitals Group     BP 07/14/15 0357 91/70 mmHg     Pulse Rate 07/14/15 0357 124     Resp 07/14/15 0357 22     Temp 07/14/15 0412 97.1 F (36.2 C)     Temp Source 07/14/15 0357 Oral     SpO2 07/14/15 0357 99 %     Weight 07/14/15 0357 110 lb (49.896 kg)     Height 07/14/15 0357  (1.651 m)     Head Cir --      Peak Flow --      Pain Score 07/14/15 0358 10     Pain Loc --  Pain Edu? --      Excl. in GC? --      Constitutional: Alert and oriented. Apparent distress Eyes: Conjunctivae are normal. PERRL. Normal extraocular movements. ENT   Head: Normocephalic and atraumatic.   Nose: No congestion/rhinnorhea.   Mouth/Throat: Mucous membranes are moist.   Neck: No stridor. Hematological/Lymphatic/Immunilogical: No cervical lymphadenopathy. Cardiovascular: Normal rate, regular rhythm. No palpable or dopplerable left PT/DP or popliteal pulses. No murmurs, rubs, or gallops. Respiratory: Normal respiratory effort without tachypnea nor retractions. Breath sounds are clear and equal bilaterally. No wheezes/rales/rhonchi. Gastrointestinal: Soft and nontender. No distention. There is no CVA tenderness. Genitourinary: deferred Musculoskeletal: Nontender with normal range of motion in all extremities. No joint effusions.  No lower extremity tenderness nor edema. Neurologic:   Normal speech and language. No gross focal neurologic deficits are appreciated. Speech is normal.  Skin:  Left leg paleness pallor and cold to touch Psychiatric: Mood and affect are normal. Speech and behavior are normal. Patient exhibits appropriate insight and judgment.  ____________________________________________    LABS (pertinent positives/negatives)  Labs Reviewed  CBC - Abnormal; Notable for the following:    WBC 32.0 (*)    RBC 3.44 (*)    Hemoglobin 9.7 (*)    HCT 30.8 (*)    MCHC 31.4 (*)    RDW 16.2 (*)    All other components within normal limits  COMPREHENSIVE METABOLIC PANEL - Abnormal; Notable for the following:    Potassium 3.4 (*)    Chloride 99 (*)    CO2 14 (*)    Glucose, Bld 195 (*)    BUN 71 (*)    Creatinine, Ser 2.18 (*)    Calcium 8.6 (*)    AST 91 (*)    GFR calc non Af Amer 22 (*)    GFR calc Af Amer 25 (*)    Anion gap 22 (*)    All other components within normal limits  PROTIME-INR - Abnormal; Notable for the following:    Prothrombin Time 34.2 (*)    All other components within normal limits            Critical Care performed: CRITICAL CARE Performed by: Bayard Males N   Total critical care time: 60 minutes  Critical care time was exclusive of separately billable procedures and treating other patients.  Critical care was necessary to treat or prevent imminent or life-threatening deterioration.  Critical care was time spent personally by me on the following activities: development of treatment plan with patient and/or surrogate as well as nursing, discussions with consultants, evaluation of patient's response to treatment, examination of patient, obtaining history from patient or surrogate, ordering and performing treatments and interventions, ordering and review of laboratory studies, ordering and review of radiographic studies, pulse oximetry and re-evaluation of patient's  condition.  ____________________________________________   INITIAL IMPRESSION / ASSESSMENT AND PLAN / ED COURSE  Pertinent labs & imaging results that were available during my care of the patient were reviewed by me and considered in my medical decision making (see chart for details). Heparin IV bolus and drip given Patient discussed with Dr. Wyn Quaker following initial evaluation. Dr. dew agreed with administration of heparin bolus and drip with plan for vascular consultation this morning. He requested that the patient be admitted to the internal medicine service  ____________________________________________   FINAL CLINICAL IMPRESSION(S) / ED DIAGNOSES  Final diagnoses:  Arterial thrombosis      Darci Current, MD 07/14/15 [phone]

## 2015-07-14 NOTE — ED Notes (Signed)
Report given to Katie, RN.

## 2015-07-14 NOTE — Procedures (Signed)
Central Venous Catheter Placement: Indication: Patient receiving vesicant or irritant drug.; Patient receiving intravenous therapy for longer than 5 days.; Patient has limited or no vascular access.   Consent:emergent  Risks and benefits explained in detail including risk of infection, bleeding, respiratory failure and death..   Hand washing performed prior to starting the procedure.   Procedure: An active timeout was performed and correct patient, name, & ID confirmed.  After explaining risk and benefits, patient was positioned correctly for central venous access. Patient was prepped using strict sterile technique including chlorohexadine preps, sterile drape, sterile gown and sterile gloves.  The area was prepped, draped and anesthetized in the usual sterile manner. Patient comfort was obtained.  A triple lumen catheter was placed in LEFT Internal Jugular Vein There was good blood return, catheter caps were placed on lumens, catheter flushed easily, the line was secured and a sterile dressing and BIO-PATCH applied.   Ultrasound was used to visualize vasculature and guidance of needle.   Number of Attempts: 1 Complications:none Estimated Blood Loss: none Chest Radiograph indicated and ordered.  Operator: Lonna Rabold.   Montine Hight David Toneisha Savary, M.D.   Pulmonary & Critical Care Medicine  Medical Director ICU-[facility] [place] Medical Director [facility] Cardio-Pulmonary Department     

## 2015-07-14 NOTE — Progress Notes (Signed)
[facility] - [place] [facility]   PATIENT NAME: [name]    MR#:  [id number]  DATE OF BIRTH:  March 29, 1946  SUBJECTIVE:  CHIEF COMPLAINT:  Patient is with severe lower extending to pain, ice cold extremity with no pulsus  REVIEW OF SYSTEMS:  CONSTITUTIONAL: No fever, fatigue or weakness.  EYES: No blurred or double vision.  EARS, NOSE, AND THROAT: No tinnitus or ear pain.  RESPIRATORY: No cough, shortness of breath, wheezing or hemoptysis.  CARDIOVASCULAR: No chest pain, orthopnea, edema.  GASTROINTESTINAL: No nausea, vomiting, diarrhea or abdominal pain.  GENITOURINARY: No dysuria, hematuria.  ENDOCRINE: No polyuria, nocturia,  HEMATOLOGY: No anemia, easy bruising or bleeding SKIN: No rash or lesion. MUSCULOSKELETAL: Severe left lower extremity pain, bluish with no pulses NEUROLOGIC: No tingling, numbness, weakness.  PSYCHIATRY: No anxiety or depression.   DRUG ALLERGIES:   Allergies  Allergen Reactions  . Morphine And Related Diarrhea and Nausea And Vomiting    VITALS:  Blood pressure 83/50, pulse 112, temperature 97.7 F (36.5 C), temperature source Oral, resp. rate 20, height  (1.651 m), weight 46.312 kg (102 lb 1.6 oz), SpO2 94 %.  PHYSICAL EXAMINATION:  GENERAL:  69 y.o.-year-old patient lying in the bed with no acute distress. Very uncomfortable from lower extent the pain EYES: Pupils equal, round, reactive to light and accommodation. No scleral icterus.  HEENT: Head atraumatic, normocephalic. Oropharynx and nasopharynx clear.  NECK:  Supple, no jugular venous distention. No thyroid enlargement, no tenderness.  LUNGS: Normal breath sounds bilaterally, no wheezing, rales,rhonchi or crepitation. No use of accessory muscles of respiration.  CARDIOVASCULAR: S1, S2 normal. No murmurs, rubs, or gallops.  ABDOMEN: Soft, nontender, nondistended. Bowel sounds present. No organomegaly or mass.  EXTREMITIES: Left lower extremity is very  tender to touch, cold to touch with no pulses NEUROLOGIC: Cranial nerves II through XII are intact. Muscle strength 5/5 in all extremities. Sensation intact. Gait not checked.  PSYCHIATRIC: The patient is alert and oriented x 3. Very uncomfortable SKIN: No obvious rash, lesion, or ulcer.    LABORATORY PANEL:   CBC  Recent Labs Lab 07/14/15 0410  WBC 32.0*  HGB 9.7*  HCT 30.8*  PLT 383   ------------------------------------------------------------------------------------------------------------------  Chemistries   Recent Labs Lab 07/14/15 0410  NA 135  K 3.4*  CL 99*  CO2 14*  GLUCOSE 195*  BUN 71*  CREATININE 2.18*  CALCIUM 8.6*  AST 91*  ALT 30  ALKPHOS 110  BILITOT 0.8   ------------------------------------------------------------------------------------------------------------------  Cardiac Enzymes No results for input(s): TROPONINI in the last 168 hours. ------------------------------------------------------------------------------------------------------------------  RADIOLOGY:  No results found.  EKG:   Orders placed or performed in visit on 11/06/14  . EKG 12-Lead    ASSESSMENT AND PLAN:   This is a 69 year old female admitted for arterial thrombus of the LLE.  1. Left lower extremity with severe ischemia :  LLE pulseless, dusky, and very painful.  H/o stents to extremity.  Angiogram and surgery is tentatively scheduled for tomorrow, in view of renal insufficiency, discussed with Dr. dew personally Cont heparin drip. Dr. Wyn Quaker is not quite sure whether her limb is salvageable are not, patient and her daughter are aware of the situation   2. Leukocytosis: likely secondary to poorly perfused leg. Meets criteria for sepsis due to WBC, tachycardia and tachypnea. Blood cultures obtained. Lower extremity is likely source, however the patient also has a LLL infiltrate concerning for pneumonia. Add Zosyn.  3. Pneumonia: community-acquired;  antibiotics as above. Supplemental O2 as needed 4. 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 Provide aggressive hydration with IV fluids IVF;  avoid nephrotoxic agents.  Nephrology consult is placed and check a.m. labs  Surgery will be considered if her creatinine is close to 1.5    5. Tobacco abuse: Patient still continues to smoke. Counseled patient to quit smoking for 3-5 minutes. We will provide Nicoderm patch 6. DVT pxs: as above 7. GI pxs: none The patient is a full code. Time spent on patient care and admission orders approximately 35 minutes.     All the records are reviewed and case discussed with Care Management/Social Workerr. Management plans discussed with the patient, family and they are in agreement.  CODE STATUS: Full code  TOTAL TIME TAKING CARE OF THIS PATIENT: 35 minutes.   POSSIBLE D/C IN ?DAYS, DEPENDING ON CLINICAL CONDITION.   Ramonita Lab M.D on 07/14/2015 at 3:02 PM  Between 7am to 6pm - Pager - [phone] After 6pm go to [email or web address] - password [email or web address] [facility]  [facility]  Office  [phone]  CC: Primary care physician; No primary care provider on file.

## 2015-07-14 NOTE — Progress Notes (Signed)
eLink Physician-Brief Progress Note Patient Name: [name] DOB: 1946/07/28 MRN: [id number]   Date of Service  07/14/2015  HPI/Events of Note  Lactic Acid = 6.4. Lower extremity ischemia? CVP pending. No Hgb available.   eICU Interventions  Await CVP reading. Will order CBC now.      Intervention Category Major Interventions: Acid-Base disturbance - evaluation and management  [name] 07/14/2015, 10:07 PM

## 2015-07-14 NOTE — Progress Notes (Signed)
Dr. Amado Coe paged. Pt pain 10/10, unresolved with percocet and iv dilaudid. RN received verbal order for prn PO dilaudid.

## 2015-07-15 ENCOUNTER — Inpatient Hospital Stay: Payer: Medicare Other

## 2015-07-15 ENCOUNTER — Encounter
Admission: EM | Disposition: A | Discharge status: Skilled Nursing Facility | Payer: Self-pay | Source: Home / Self Care | Attending: Internal Medicine

## 2015-07-15 ENCOUNTER — Ambulatory Visit: Payer: Medicare Other | Admitting: Physical Therapy

## 2015-07-15 DIAGNOSIS — I749 Embolism and thrombosis of unspecified artery: Secondary | ICD-10-CM

## 2015-07-15 DIAGNOSIS — I998 Other disorder of circulatory system: Secondary | ICD-10-CM

## 2015-07-15 DIAGNOSIS — J189 Pneumonia, unspecified organism: Secondary | ICD-10-CM

## 2015-07-15 LAB — BASIC METABOLIC PANEL
Anion gap: 6 (ref 5–15)
Anion gap: 8 (ref 5–15)
BUN: 52 mg/dL — ABNORMAL HIGH (ref 6–20)
BUN: 49 mg/dL — ABNORMAL HIGH (ref 6–20)
CO2: 19 mmol/L — ABNORMAL LOW (ref 22–32)
CO2: 25 mmol/L (ref 22–32)
Calcium: 7 mg/dL — ABNORMAL LOW (ref 8.9–10.3)
Calcium: 6.9 mg/dL — ABNORMAL LOW (ref 8.9–10.3)
Chloride: 119 mmol/L — ABNORMAL HIGH (ref 101–111)
Chloride: 111 mmol/L (ref 101–111)
Creatinine, Ser: 1.23 mg/dL — ABNORMAL HIGH (ref 0.44–1.00)
Creatinine, Ser: 1.08 mg/dL — ABNORMAL HIGH (ref 0.44–1.00)
GFR calc Af Amer: 50 mL/min — ABNORMAL LOW (ref >60)
GFR calc Af Amer: 59 mL/min — ABNORMAL LOW (ref >60)
GFR calc non Af Amer: 43 mL/min — ABNORMAL LOW (ref >60)
GFR calc non Af Amer: 51 mL/min — ABNORMAL LOW (ref >60)
Glucose, Bld: 124 mg/dL — ABNORMAL HIGH (ref 65–99)
Glucose, Bld: 118 mg/dL — ABNORMAL HIGH (ref 65–99)
Potassium: 4.4 mmol/L (ref 3.5–5.1)
Potassium: 4.1 mmol/L (ref 3.5–5.1)
Sodium: 144 mmol/L (ref 135–145)
Sodium: 144 mmol/L (ref 135–145)

## 2015-07-15 LAB — CBC
HCT: 23.2 % — ABNORMAL LOW (ref 35.0–47.0)
Hemoglobin: 7.6 g/dL — ABNORMAL LOW (ref 12.0–16.0)
MCH: 29.4 pg (ref 26.0–34.0)
MCHC: 32.8 g/dL (ref 32.0–36.0)
MCV: 89.6 fL (ref 80.0–100.0)
Platelets: 201 10*3/uL (ref 150–440)
RBC: 2.59 MIL/uL — ABNORMAL LOW (ref 3.80–5.20)
RDW: 16.9 % — ABNORMAL HIGH (ref 11.5–14.5)
WBC: 12.5 10*3/uL — ABNORMAL HIGH (ref 3.6–11.0)

## 2015-07-15 LAB — CBC WITH DIFFERENTIAL/PLATELET
Basophils Absolute: 0 10*3/uL (ref 0–0.1)
Basophils Relative: 0 %
Eosinophils Absolute: 0 10*3/uL (ref 0–0.7)
Eosinophils Relative: 0 %
HCT: 20 % — ABNORMAL LOW (ref 35.0–47.0)
Hemoglobin: 6.3 g/dL — ABNORMAL LOW (ref 12.0–16.0)
Lymphocytes Relative: 7 %
Lymphs Abs: 1.1 10*3/uL (ref 1.0–3.6)
MCH: 27.8 pg (ref 26.0–34.0)
MCHC: 31.4 g/dL — ABNORMAL LOW (ref 32.0–36.0)
MCV: 88.6 fL (ref 80.0–100.0)
Monocytes Absolute: 0.9 10*3/uL (ref 0.2–0.9)
Monocytes Relative: 6 %
Neutro Abs: 13.2 10*3/uL — ABNORMAL HIGH (ref 1.4–6.5)
Neutrophils Relative %: 87 %
Platelets: 255 10*3/uL (ref 150–440)
RBC: 2.25 MIL/uL — ABNORMAL LOW (ref 3.80–5.20)
RDW: 16.7 % — ABNORMAL HIGH (ref 11.5–14.5)
WBC: 15.2 10*3/uL — ABNORMAL HIGH (ref 3.6–11.0)

## 2015-07-15 LAB — MAGNESIUM: Magnesium: 2.2 mg/dL (ref 1.7–2.4)

## 2015-07-15 LAB — BLOOD GAS, ARTERIAL
Acid-base deficit: 0.2 mmol/L (ref 0.0–2.0)
Bicarbonate: 22.1 mEq/L (ref 21.0–28.0)
FIO2: 1
MECHVT: 400 mL
Mechanical Rate: 32
O2 Saturation: 100 %
PEEP: 5 cmH2O
Patient temperature: 37
RATE: 32 resp/min
pCO2 arterial: 29 mmHg — ABNORMAL LOW (ref 32.0–48.0)
pH, Arterial: 7.49 — ABNORMAL HIGH (ref 7.350–7.450)
pO2, Arterial: 465 mmHg — ABNORMAL HIGH (ref 83.0–108.0)

## 2015-07-15 LAB — PREPARE RBC (CROSSMATCH)

## 2015-07-15 LAB — HEPARIN LEVEL (UNFRACTIONATED): Heparin Unfractionated: 1.13 IU/mL — ABNORMAL HIGH (ref 0.30–0.70)

## 2015-07-15 LAB — CK: Total CK: 11057 U/L — ABNORMAL HIGH (ref 38–234)

## 2015-07-15 LAB — HEMOGLOBIN AND HEMATOCRIT, BLOOD
HCT: 27.7 % — ABNORMAL LOW (ref 35.0–47.0)
Hemoglobin: 9 g/dL — ABNORMAL LOW (ref 12.0–16.0)

## 2015-07-15 LAB — PHOSPHORUS: Phosphorus: 5.1 mg/dL — ABNORMAL HIGH (ref 2.5–4.6)

## 2015-07-15 LAB — ABO/RH: ABO/RH(D): O POS

## 2015-07-15 SURGERY — LOWER EXTREMITY ANGIOGRAPHY
Anesthesia: Moderate Sedation | Laterality: Left

## 2015-07-15 MED ORDER — SODIUM CHLORIDE 0.9 % IV SOLN
INTRAVENOUS | Status: DC
  Administered 2015-07-19: 04:00:00 via INTRAVENOUS

## 2015-07-15 MED ORDER — SODIUM CHLORIDE 0.9 % IV SOLN
1.0000 g | Freq: Once | INTRAVENOUS | Status: AC
  Administered 2015-07-15: 1 g via INTRAVENOUS
  Filled 2015-07-15: qty 10

## 2015-07-15 MED ORDER — ANTISEPTIC ORAL RINSE SOLUTION (CORINZ)
7.0000 mL | Freq: Four times a day (QID) | OROMUCOSAL | Status: DC
  Administered 2015-07-15 – 2015-07-17 (×10): 7 mL via OROMUCOSAL
  Filled 2015-07-15 (×12): qty 7

## 2015-07-15 MED ORDER — CHLORHEXIDINE GLUCONATE 0.12% ORAL RINSE (MEDLINE KIT)
15.0000 mL | Freq: Two times a day (BID) | OROMUCOSAL | Status: DC
  Administered 2015-07-15 – 2015-07-17 (×5): 15 mL via OROMUCOSAL
  Filled 2015-07-15 (×7): qty 15

## 2015-07-15 MED ORDER — VANCOMYCIN HCL IN DEXTROSE 750-5 MG/150ML-% IV SOLN
750.0000 mg | Freq: Once | INTRAVENOUS | Status: AC
  Administered 2015-07-15: 750 mg via INTRAVENOUS
  Filled 2015-07-15: qty 150

## 2015-07-15 MED ORDER — HYDROCORTISONE 5 MG/ML ORAL SUSPENSION: 10.0000 mg | Freq: Two times a day (BID) | ORAL | Status: DC

## 2015-07-15 MED ORDER — SODIUM CHLORIDE 0.9 % IV SOLN
Freq: Once | INTRAVENOUS | Status: AC
  Administered 2015-07-15: 03:00:00 via INTRAVENOUS

## 2015-07-15 MED ORDER — SODIUM CHLORIDE 0.9 % IV SOLN
500.0000 mg | INTRAVENOUS | Status: DC
  Administered 2015-07-15: 500 mg via INTRAVENOUS
  Filled 2015-07-15 (×3): qty 500

## 2015-07-15 MED ORDER — FENTANYL BOLUS VIA INFUSION
25.0000 ug | INTRAVENOUS | Status: DC | PRN
  Filled 2015-07-15: qty 50

## 2015-07-15 MED ORDER — HYDROCORTISONE NA SUCCINATE PF 100 MG IJ SOLR
50.0000 mg | Freq: Four times a day (QID) | INTRAMUSCULAR | Status: DC
  Administered 2015-07-15 – 2015-07-19 (×18): 50 mg via INTRAVENOUS
  Filled 2015-07-15 (×2): qty 2
  Filled 2015-07-15 (×2): qty 1
  Filled 2015-07-15 (×2): qty 2
  Filled 2015-07-15: qty 1
  Filled 2015-07-15 (×6): qty 2
  Filled 2015-07-15 (×2): qty 1
  Filled 2015-07-15: qty 2
  Filled 2015-07-15: qty 1
  Filled 2015-07-15 (×2): qty 2
  Filled 2015-07-15: qty 1

## 2015-07-15 MED ORDER — HEPARIN (PORCINE) IN NACL 100-0.45 UNIT/ML-% IJ SOLN: 550.0000 [IU]/h | INTRAMUSCULAR | Status: DC

## 2015-07-15 MED ORDER — DEXTROSE 5 % IV SOLN
0.0000 ug/min | INTRAVENOUS | Status: DC
  Administered 2015-07-15: 20 ug/min via INTRAVENOUS
  Administered 2015-07-16: 12 ug/min via INTRAVENOUS
  Filled 2015-07-15 (×2): qty 16

## 2015-07-15 MED ORDER — LEVOFLOXACIN IN D5W 750 MG/150ML IV SOLN
750.0000 mg | INTRAVENOUS | Status: DC
  Administered 2015-07-15: 750 mg via INTRAVENOUS
  Filled 2015-07-15 (×2): qty 150

## 2015-07-15 MED ORDER — CHLORHEXIDINE GLUCONATE CLOTH 2 % EX PADS
6.0000 | MEDICATED_PAD | Freq: Once | CUTANEOUS | Status: AC
  Administered 2015-07-15: 6 via TOPICAL

## 2015-07-15 MED ORDER — PIPERACILLIN-TAZOBACTAM 3.375 G IVPB
3.3750 g | Freq: Three times a day (TID) | INTRAVENOUS | Status: DC
  Administered 2015-07-15 – 2015-07-20 (×16): 3.375 g via INTRAVENOUS
  Filled 2015-07-15 (×18): qty 50

## 2015-07-15 NOTE — Progress Notes (Addendum)
ANTIBIOTIC CONSULT NOTE - INITIAL  Pharmacy Consult for Vancomycin/Levaquin Indication: Sepsis/PNA  Allergies  Allergen Reactions  . Morphine And Related Diarrhea and Nausea And Vomiting    Patient Measurements: Height:  (165.1 cm) Weight: 102 lb 1.6 oz (46.312 kg) IBW/kg (Calculated) : 57   Vital Signs: Temp: 97.6 F (36.4 C) (09/29 0900) Temp Source: Oral (09/29 0900) BP: 86/57 mmHg (09/29 1000) Pulse Rate: 89 (09/29 1000) Intake/Output from previous day: 09/28 0701 - 09/29 0700 In: 5615.6 [I.V.:1954.6; Blood:561; IV Piggyback:3100] Out: 1150 [Urine:1150] Intake/Output from this shift: Total I/O In: 982.9 [I.V.:682.9; Blood:300] Out: 175 [Urine:175]  Labs:  Recent Labs  07/14/15 0410 07/14/15 2358 07/15/15 0613  WBC 32.0* 15.2* 12.5*  HGB 9.7* 6.3* 7.6*  PLT 383 255 201  CREATININE 2.18* 1.23* 1.08*   Estimated Creatinine Clearance: 35.4 mL/min (by C-G formula based on Cr of 1.08). No results for input(s): VANCOTROUGH, VANCOPEAK, VANCORANDOM, GENTTROUGH, GENTPEAK, GENTRANDOM, TOBRATROUGH, TOBRAPEAK, TOBRARND, AMIKACINPEAK, AMIKACINTROU, AMIKACIN in the last 72 hours.   Microbiology: Recent Results (from the past 720 hour(s))  Culture, blood (routine x 2)     Status: None (Preliminary result)   Collection Time: 07/14/15  6:53 AM  Result Value Ref Range Status   Specimen Description BLOOD RIGHT ANTECUBITAL  Final   Special Requests BOTTLES DRAWN AEROBIC AND ANAEROBIC 5CC  Final   Culture NO GROWTH 1 DAY  Final   Report Status PENDING  Incomplete  Culture, blood (routine x 2)     Status: None (Preliminary result)   Collection Time: 07/14/15  6:53 AM  Result Value Ref Range Status   Specimen Description BLOOD RIGHT ANTECUBITAL  Final   Special Requests BOTTLES DRAWN AEROBIC AND ANAEROBIC 5CC  Final   Culture NO GROWTH 1 DAY  Final   Report Status PENDING  Incomplete  MRSA PCR Screening     Status: None   Collection Time: 07/14/15  8:27 PM  Result  Value Ref Range Status   MRSA by PCR NEGATIVE NEGATIVE Final    Comment:        The GeneXpert MRSA Assay (FDA approved for NASAL specimens only), is one component of a comprehensive MRSA colonization surveillance program. It is not intended to diagnose MRSA infection nor to guide or monitor treatment for MRSA infections.     Medical History: Past Medical History  Diagnosis Date  . Peripheral vascular disease     Medications:  Scheduled:  . antiseptic oral rinse  7 mL Mouth Rinse QID  . aspirin  81 mg Oral Daily  . calcium gluconate  1 g Intravenous Once  . chlorhexidine gluconate  15 mL Mouth Rinse BID  . famotidine  20 mg Oral Daily  . lisinopril  20 mg Oral Daily   And  . hydrochlorothiazide  12.5 mg Oral Daily  . hydrocortisone sod succinate (SOLU-CORTEF) inj  50 mg Intravenous Q6H  . levofloxacin (LEVAQUIN) IV  750 mg Intravenous Q48H  . nicotine  7 mg Transdermal Daily  . piperacillin-tazobactam (ZOSYN)  IV  3.375 g Intravenous 3 times per day  . sodium chloride  3 mL Intravenous Q12H  . vancomycin  500 mg Intravenous Q18H  . vancomycin  750 mg Intravenous Once   Infusions:  . sodium chloride    . fentaNYL infusion INTRAVENOUS 150 mcg/hr (07/15/15 1055)  . norepinephrine 20 mcg/min (07/15/15 1056)  .  sodium bicarbonate 150 mEq in sterile water 1000 mL infusion 150 mL/hr at 07/15/15 0727   PRN: Place/Maintain arterial  line **AND** sodium chloride, HYDROmorphone (DILAUDID) injection, HYDROmorphone, labetalol, magnesium hydroxide, ondansetron **OR** ondansetron (ZOFRAN) IV, oxyCODONE-acetaminophen  Assessment: 69 y/o F with LLE arterial thrombus, sepsis, and PNA ordered empiric abx with Levaquin, Zosyn, and vancomycin.   Kinetics:   Ke: 0.034 Vd: 32.4   Goal of Therapy:  Vancomycin trough level 15-20 mcg/ml  Plan:  1. Vancomycin 750 mg iv once given. Will order vancomycin 500 mg iv q 18 hours with stacked dosing and a trough with the 5th dose.   2. Will  order Levaquin 750 iv q 48 hours.   Will speak to MD to determine if it is necessary to use both Levaquin and Zosyn in this patient. Will f/u renal function and culture results.   Luisa Hart D 07/15/2015,11:31 AM

## 2015-07-15 NOTE — Progress Notes (Signed)
ANTIBIOTIC CONSULT NOTE - INITIAL  Pharmacy Consult for Zosyn Indication: pneumonia (CAP), possible LLE ulceration/infection  Allergies  Allergen Reactions  . Morphine And Related Diarrhea and Nausea And Vomiting    Patient Measurements: Height:  (165.1 cm) Weight: 102 lb 1.6 oz (46.312 kg) IBW/kg (Calculated) : 57   Vital Signs: Temp: 97.8 F (36.6 C) (09/29 0345) Temp Source: Oral (09/29 0345) BP: 91/68 mmHg (09/29 0500) Pulse Rate: 102 (09/29 0345) Intake/Output from previous day: 09/28 0701 - 09/29 0700 In: 4552.3 [I.V.:1502.3; IV Piggyback:3050] Out: 900 [Urine:900] Intake/Output from this shift: Total I/O In: 1393.1 [I.V.:1343.1; IV Piggyback:50] Out: 500 [Urine:500]  Labs:  Recent Labs  07/14/15 0410 07/14/15 2358  WBC 32.0* 15.2*  HGB 9.7* 6.3*  PLT 383 255  CREATININE 2.18* 1.23*   Estimated Creatinine Clearance: 31.1 mL/min (by C-G formula based on Cr of 1.23). No results for input(s): VANCOTROUGH, VANCOPEAK, VANCORANDOM, GENTTROUGH, GENTPEAK, GENTRANDOM, TOBRATROUGH, TOBRAPEAK, TOBRARND, AMIKACINPEAK, AMIKACINTROU, AMIKACIN in the last 72 hours.   Microbiology: Recent Results (from the past 720 hour(s))  Culture, blood (routine x 2)     Status: None (Preliminary result)   Collection Time: 07/14/15  6:53 AM  Result Value Ref Range Status   Specimen Description BLOOD RIGHT ANTECUBITAL  Final   Special Requests BOTTLES DRAWN AEROBIC AND ANAEROBIC 5CC  Final   Culture NO GROWTH <12 HOURS  Final   Report Status PENDING  Incomplete  Culture, blood (routine x 2)     Status: None (Preliminary result)   Collection Time: 07/14/15  6:53 AM  Result Value Ref Range Status   Specimen Description BLOOD RIGHT ANTECUBITAL  Final   Special Requests BOTTLES DRAWN AEROBIC AND ANAEROBIC 5CC  Final   Culture NO GROWTH <12 HOURS  Final   Report Status PENDING  Incomplete  MRSA PCR Screening     Status: None   Collection Time: 07/14/15  8:27 PM  Result Value Ref  Range Status   MRSA by PCR NEGATIVE NEGATIVE Final    Comment:        The GeneXpert MRSA Assay (FDA approved for NASAL specimens only), is one component of a comprehensive MRSA colonization surveillance program. It is not intended to diagnose MRSA infection nor to guide or monitor treatment for MRSA infections.     Medical History: Past Medical History  Diagnosis Date  . Peripheral vascular disease      Assessment: 69 yo female here with LLE arterial occlusion starting Zosyn for CAP, and to cover possible LLE source.   Goal of Therapy:  Resolution of infection  Plan:  Will order Zosyn 3.375 g IV q12h EI based on current renal function (CrCl<20 ml/min)  0929 CrCl increased to >20 mL/min, increased frequency of Zosyn to Q8H.   Carola Frost, Pharm.D. Clinical Pharmacist 07/15/2015,5:15 AM

## 2015-07-15 NOTE — Consult Note (Signed)
Date: 07/15/2015                  Patient Name:  [name]  MRN: [id number]  DOB: 1946-05-12  Age / Sex: 69 y.o., female         PCP: No primary care provider on file.                 Service Requesting Consult: Internal medicine                 Reason for Consult: ARF            History of Present Illness: Patient is a 69 y.o. female with medical problems of severe peripheral vascular disease, right above-the-knee amputation, who was admitted to [facility] on 07/14/2015 for evaluation of acute on chronic ischemia of the left lower extremity with rest pain. She has a limb threatening situation. Angiogram was supposed to be performed today but because of her critical illness and is being deferred. There is mottling of her left leg. Amputation is planned now. He is currently on ventilator. Extensive infiltrate is noted in the right lung consistent with pneumonia. All information is obtained from patient's daughters, patient's chart and primary team   Medications: Outpatient medications: Prescriptions prior to admission  Medication Sig Dispense Refill Last Dose  . aspirin 81 MG tablet Take 81 mg by mouth daily.   07/13/2015 at Unknown time  . famotidine (PEPCID) 20 MG tablet Take 20 mg by mouth daily.   07/13/2015 at Unknown time  . lisinopril-hydrochlorothiazide (PRINZIDE,ZESTORETIC) 20-12.5 MG tablet Take 1 tablet by mouth daily.  1 07/13/2015 at Unknown time  . magnesium hydroxide (MILK OF MAGNESIA) 400 MG/5ML suspension Take 30 mLs by mouth daily as needed for mild constipation.   prn at prn  . oxyCODONE-acetaminophen (PERCOCET/ROXICET) 5-325 MG per tablet Take 1 tablet by mouth every 4 (four) hours as needed for severe pain.   prn at prn  . warfarin (COUMADIN) 5 MG tablet Take 2.5-5 mg by mouth daily. Takes 2.5mg  on Monday, Wednesday and Friday. All other days take    07/13/2015 at Unknown time    Current medications: Current Facility-Administered Medications  Medication Dose  Route Frequency Provider Last Rate Last Dose  . 0.9 %  sodium chloride infusion   Intravenous Continuous Annice Needy, MD      . 0.9 %  sodium chloride infusion   Intra-arterial PRN Erin Fulling, MD      . antiseptic oral rinse solution (CORINZ)  7 mL Mouth Rinse QID Shane Crutch, MD      . aspirin chewable tablet 81 mg  81 mg Oral Daily Arnaldo Natal, MD   81 mg at 07/14/15 0844  . chlorhexidine gluconate (PERIDEX) 0.12 % solution 15 mL  15 mL Mouth Rinse BID Shane Crutch, MD   15 mL at 07/15/15 0747  . famotidine (PEPCID) tablet 20 mg  20 mg Oral Daily Arnaldo Natal, MD   20 mg at 07/14/15 0844  . fentaNYL in NS (59mcg/ml) infusion-PREMIX  10 mcg/hr Intravenous Continuous Erin Fulling, MD 1 mL/hr at 07/15/15 0800 10 mcg/hr at 07/15/15 0800  . lisinopril (PRINIVIL,ZESTRIL) tablet 20 mg  20 mg Oral Daily Arnaldo Natal, MD   20 mg at 07/14/15 0848   And  . hydrochlorothiazide (MICROZIDE) capsule 12.5 mg  12.5 mg Oral Daily Arnaldo Natal, MD   12.5 mg at 07/14/15 0847  . HYDROmorphone (DILAUDID) injection 0.5 mg  0.5 mg Intravenous Q3H PRN Arnaldo Natal, MD   0.5 mg at 07/14/15 1342  . HYDROmorphone (DILAUDID) tablet 1 mg  1 mg Oral Q3H PRN Ramonita Lab, MD   1 mg at 07/14/15 1430  . labetalol (NORMODYNE,TRANDATE) injection 10 mg  10 mg Intravenous Q2H PRN Arnaldo Natal, MD      . levofloxacin (LEVAQUIN) IVPB 750 mg  750 mg Intravenous Q48H Aruna Gouru, MD      . magnesium hydroxide (MILK OF MAGNESIA) suspension 30 mL  30 mL Oral Daily PRN Arnaldo Natal, MD      . nicotine (NICODERM CQ - dosed in mg/24 hr) patch 7 mg  7 mg Transdermal Daily Arnaldo Natal, MD   7 mg at 07/14/15 0844  . norepinephrine (LEVOPHED)  in D5W premix infusion  0-40 mcg/min Intravenous Titrated Ramonita Lab, MD 56.3 mL/hr at 07/15/15 0730 15 mcg/min at 07/15/15 0730  . ondansetron (ZOFRAN) tablet 4 mg  4 mg Oral Q6H PRN Arnaldo Natal, MD       Or  .  ondansetron [facility]) injection 4 mg  4 mg Intravenous Q6H PRN Arnaldo Natal, MD   4 mg at 07/14/15 [phone]  . oxyCODONE-acetaminophen (PERCOCET/ROXICET) 5-325 MG per tablet 1 tablet  1 tablet Oral Q4H PRN Arnaldo Natal, MD   1 tablet at 07/14/15 1629  . piperacillin-tazobactam (ZOSYN) IVPB 3.375 g  3.375 g Intravenous 3 times per day Ramonita Lab, MD   3.375 g at 07/15/15 0552  . sodium bicarbonate 150 mEq in sterile water 1,000 mL infusion   Intravenous Continuous Shane Crutch, MD 150 mL/hr at 07/15/15 0727    . sodium chloride 0.9 % injection 3 mL  3 mL Intravenous Q12H Arnaldo Natal, MD   3 mL at 07/14/15 2208  . vancomycin (VANCOCIN) IVPB 750 mg/150 ml premix  750 mg Intravenous Once Ramonita Lab, MD          Allergies: Allergies  Allergen Reactions  . Morphine And Related Diarrhea and Nausea And Vomiting      Past Medical History: Past Medical History  Diagnosis Date  . Peripheral vascular disease      Past Surgical History: Past Surgical History  Procedure Laterality Date  . Below knee leg amputation    . Stents left leg       Family History: No family history on file.   Social History: Social History   Social History  . Marital Status: Married    Spouse Name: N/A  . Number of Children: N/A  . Years of Education: N/A   Occupational History  . Not on file.   Social History Main Topics  . Smoking status: Current Some Day Smoker  . Smokeless tobacco: Not on file  . Alcohol Use: No  . Drug Use: No  . Sexual Activity: Not on file   Other Topics Concern  . Not on file   Social History Narrative     Review of Systems: Not available because patient is sedated and is on ventilator Gen:  HEENT:  CV:  Resp:  GI: GU :  MS:  Derm:   Psych: Heme:  Neuro:  Endocrine  Vital Signs: Blood pressure 89/58, pulse 82, temperature 97.6 F (36.4 C), temperature source Oral, resp. rate 32, height  (1.651 m), weight 46.312 kg (102 lb 1.6  oz), SpO2 100 %.   Intake/Output Summary (Last 24 hours) at 07/15/15 0956 Last data filed at 07/15/15 0900  Gross per 24 hour  Intake 5704.6 ml  Output   1150 ml  Net 4554.6 ml    Weight trends: [employer]   07/14/15 0357 07/14/15 0820  Weight: 49.896 kg (110 lb) 46.312 kg (102 lb 1.6 oz)    Physical Exam: General:  frail, elderly, critically ill-appearing   HEENT  anicteric, small pupils 2-3 mm, ET tube in place   Neck:  supple   Lungs:  ventilator assisted, coarse crackles, FiO2 30%   Heart::  irregular rhythm   Abdomen:  soft, nontender, nondistended   Extremities:  right above-the-knee amputation, left leg mottling up to upper thighs   Neurologic:  sedated   Skin:  no acute rashes   Access:   Foley:  present        Lab results: Basic Metabolic Panel:  Recent Labs Lab 07/14/15 0410 07/14/15 2358 07/15/15 0613  NA 135 144 144  K 3.4* 4.4 4.1  CL 99* 119* 111  CO2 14* 19* 25  GLUCOSE 195* 124* 118*  BUN 71* 52* 49*  CREATININE 2.18* 1.23* 1.08*  CALCIUM 8.6* 7.0* 6.9*  MG  --  2.2  --   PHOS  --  5.1*  --     Liver Function Tests:  Recent Labs Lab 07/14/15 0410  AST 91*  ALT 30  ALKPHOS 110  BILITOT 0.8  PROT 6.9  ALBUMIN 3.6   No results for input(s): LIPASE, AMYLASE in the last 168 hours. No results for input(s): AMMONIA in the last 168 hours.  CBC:  Recent Labs Lab 07/14/15 2358 07/15/15 0613  WBC 15.2* 12.5*  NEUTROABS 13.2*  --   HGB 6.3* 7.6*  HCT 20.0* 23.2*  MCV 88.6 89.6  PLT 255 201    Cardiac Enzymes:  Recent Labs Lab 07/14/15 2033  CKTOTAL 349*    BNP: Invalid input(s): POCBNP  CBG:  Recent Labs Lab 07/14/15 1803  GLUCAP 107*    Microbiology: Recent Results (from the past 720 hour(s))  Culture, blood (routine x 2)     Status: None (Preliminary result)   Collection Time: 07/14/15  6:53 AM  Result Value Ref Range Status   Specimen Description BLOOD RIGHT ANTECUBITAL  Final   Special Requests  BOTTLES DRAWN AEROBIC AND ANAEROBIC 5CC  Final   Culture NO GROWTH 1 DAY  Final   Report Status PENDING  Incomplete  Culture, blood (routine x 2)     Status: None (Preliminary result)   Collection Time: 07/14/15  6:53 AM  Result Value Ref Range Status   Specimen Description BLOOD RIGHT ANTECUBITAL  Final   Special Requests BOTTLES DRAWN AEROBIC AND ANAEROBIC 5CC  Final   Culture NO GROWTH 1 DAY  Final   Report Status PENDING  Incomplete  MRSA PCR Screening     Status: None   Collection Time: 07/14/15  8:27 PM  Result Value Ref Range Status   MRSA by PCR NEGATIVE NEGATIVE Final    Comment:        The GeneXpert MRSA Assay (FDA approved for NASAL specimens only), is one component of a comprehensive MRSA colonization surveillance program. It is not intended to diagnose MRSA infection nor to guide or monitor treatment for MRSA infections.      Coagulation Studies:  Recent Labs  07/14/15 0410  LABPROT 34.2*  INR 3.38    Urinalysis: No results for input(s): COLORURINE, LABSPEC, PHURINE, GLUCOSEU, HGBUR, BILIRUBINUR, KETONESUR, PROTEINUR, UROBILINOGEN, NITRITE, LEUKOCYTESUR in the last 72 hours.  Invalid input(s): APPERANCEUR  Imaging: Dg Chest 1 View  07/15/2015   CLINICAL DATA:  Dyspnea.  Ventilator.  EXAM: CHEST 1 VIEW  COMPARISON:  07/14/2015  FINDINGS: Endotracheal tube in good position. Left jugular central venous catheter tip in the SVC unchanged. NG tube in the stomach.  Extensive infiltrate throughout most of the right lung appears unchanged. Left lower lobe atelectasis unchanged.  Small right pleural effusion  COPD  IMPRESSION: Extensive infiltrate throughout the right lung is unchanged and most consistent with pneumonia. Small right effusion  Left lower lobe atelectasis unchanged  Endotracheal tube remains in good position.   Electronically Signed   By: Marlan Palau M.D.   On: 07/15/2015 07:04   Dg Abd 1 View  07/14/2015   CLINICAL DATA:  NG tube placement.   EXAM: ABDOMEN - 1 VIEW  COMPARISON:  Radiograph obtained yesterday.  FINDINGS: Tip and side port of the enteric tube below the diaphragm in the stomach. There is air within normal caliber amorphous bowel loops in the left abdomen, nonspecific. No bowel dilatation. No evidence of free air. Bi-iliac vascular stent in the lower abdomen. Interstitial change in the included lungs.  IMPRESSION: Tip and side port of the enteric tube below the diaphragm in the stomach.   Electronically Signed   By: Rubye Oaks M.D.   On: 07/14/2015 21:55   Dg Chest Port 1 View  07/14/2015   CLINICAL DATA:  Post intubation films.  EXAM: PORTABLE CHEST 1 VIEW  COMPARISON:  February 12, 2015  FINDINGS: The heart size and mediastinal contours are within normal limits. Endotracheal tube is identified with distal tip 4.2 cm from carina. Left central venous line is identified with distal tip in the superior vena cava. Airspace opacities noted identified throughout both lungs with increased pulmonary interstitium. There is dense consolidation of left lung base with probable left pleural effusion. The visualized skeletal structures are stable.  IMPRESSION: Endotracheal tube in good position.  There is no pneumothorax.  Diffuse airspace opacities noted throughout bilateral lungs. Differential diagnosis includes pulmonary edema and bilateral pneumonias   Electronically Signed   By: Sherian Rein M.D.   On: 07/14/2015 21:10   Dg Abd Portable 2v  07/14/2015   CLINICAL DATA:  Abdominal pain. Blood in stool. Peripheral vascular disease.  EXAM: PORTABLE ABDOMEN - 2 VIEW  COMPARISON:  None.  FINDINGS: No evidence of dilated bowel loops or free air. Endovascular stent seen throughout the iliac arteries bilaterally. Diffuse interstitial infiltrates seen throughout the visualized portions of the lungs suspicious for diffuse pulmonary edema.  IMPRESSION: Unremarkable bowel gas pattern.  Diffuse pulmonary interstitial infiltrates suspicious for edema.    Electronically Signed   By: Myles Rosenthal M.D.   On: 07/14/2015 18:35      Assessment & Plan: Pt is a 69 y.o. yo female with a PMHX of severe peripheral vascular disease, was admitted on 07/14/2015 with limb threatening ischemia.  1. Acute renal failure, likely ATN and volume depletion. Serum creatinine is improving Supportive care at present 2. acute respiratory failure. Right lung pneumonia noted on chest x-ray  3. Acute on chronic left lower extremity ischemia. Vascular surgery is planning left leg amputation

## 2015-07-15 NOTE — Progress Notes (Signed)
Patient continues to follow commands, denies pain and no coughing or gagging against ET tube noted. Will continue to monitor. Titrating levophed drip for hypotension.

## 2015-07-15 NOTE — Consult Note (Signed)
[facility] Consultation      Name: [name] MRN: [id number] DOB: [birth year]    ADMISSION DATE:  07/14/2015   CHIEF COMPLAINT:   Acute resp distress, unresponsiveness   HISTORY OF PRESENT ILLNESS   69 yo white female with multiple medical issues admitted to ICU for acute hypoxic resp failure and acute mental status changes, patient placed on 100% NRB mask, patient unresponsive, RAPID RESPONSE TEAM was called to evaluate, patient was immeditaely transferred to ICU, I artrived at bedside, patient with agonal respirations, striggling to breathe, using accessoy muscles to breathe, ABG shows metabolic acidosis and severe hypoxia PF ratio 62  Patient also with acute Left ischemic lower ext-Vascular surgery following  SUBJECTIVE: Mildly awake this morning with some mild agitation. Daughter's at the bedside.   SIGNIFICANT EVENTS   9/28 ICu admission  PAST MEDICAL HISTORY    :  Past Medical History  Diagnosis Date  . Peripheral vascular disease    Past Surgical History  Procedure Laterality Date  . Below knee leg amputation    . Stents left leg     Prior to Admission medications   Medication Sig Start Date End Date Taking? Authorizing Provider  aspirin 81 MG tablet Take 81 mg by mouth daily.   Yes Historical Provider, MD  famotidine (PEPCID) 20 MG tablet Take 20 mg by mouth daily.   Yes Historical Provider, MD  lisinopril-hydrochlorothiazide (PRINZIDE,ZESTORETIC) 20-12.5 MG tablet Take 1 tablet by mouth daily. 05/27/15  Yes Historical Provider, MD  magnesium hydroxide (MILK OF MAGNESIA) 400 MG/5ML suspension Take 30 mLs by mouth daily as needed for mild constipation.   Yes Historical Provider, MD  oxyCODONE-acetaminophen (PERCOCET/ROXICET) 5-325 MG per tablet Take 1 tablet by mouth every 4 (four) hours as needed for severe pain.   Yes Historical Provider, MD  warfarin (COUMADIN) 5 MG tablet Take 2.5-5 mg by mouth daily. Takes 2.52m on Monday,  Wednesday and Friday. All other days take 572m  Yes Historical Provider, MD   Allergies  Allergen Reactions  . Morphine And Related Diarrhea and Nausea And Vomiting     FAMILY HISTORY   No family history on file.    SOCIAL HISTORY    reports that she has been smoking.  She does not have any smokeless tobacco history on file. She reports that she does not drink alcohol or use illicit drugs.  Review of Systems  Unable to perform ROS: critical illness      VITAL SIGNS    Temp:  [97.4 F (36.3 C)-99.2 F (37.3 C)] 99.2 F (37.3 C) (09/29 1200) Pulse Rate:  [44-129] 116 (09/29 1200) Resp:  [13-32] 17 (09/29 1200) BP: (41-119)/(21-74) 104/62 mmHg (09/29 1200) SpO2:  [53 %-100 %] 100 % (09/29 1200) Arterial Line BP: (87-166)/(45-73) 125/55 mmHg (09/29 1200) FiO2 (%):  [30 %-100 %] 30 % (09/29 1200) HEMODYNAMICS: CVP:  [0 mmHg-21 mmHg] 0 mmHg VENTILATOR SETTINGS: Vent Mode:  [-] PRVC FiO2 (%):  [30 %-100 %] 30 % Set Rate:  [20 bmp-32 bmp] 32 bmp Vt Set:  [400 mL] 400 mL PEEP:  [5 cmH20] 5 cmH20 INTAKE / OUTPUT:  Intake/Output Summary (Last 24 hours) at 07/15/15 1329 Last data filed at 07/15/15 1200  Gross per 24 hour  Intake 6098.76 ml  Output   1475 ml  Net 4623.76 ml       PHYSICAL EXAM   Physical Exam  Constitutional: She appears distressed.  HENT:  Head: Normocephalic and atraumatic.  Eyes: Pupils  are equal, round, and reactive to light. No scleral icterus.  Neck: Normal range of motion. Neck supple.  Cardiovascular: Normal rate and regular rhythm.   No murmur heard. Pulmonary/Chest: No respiratory distress. She has no wheezes. She has no rales.  Intubated and sedated  Abdominal: Soft. She exhibits no distension. There is no tenderness.  Musculoskeletal: She exhibits no edema.  RT BKA, LEFT decreased Pulses  Neurological: She displays normal reflexes. Coordination normal.  gcs<8T  Skin: Skin is warm. No rash noted. She is diaphoretic.        LABS   LABS:  CBC  Recent Labs Lab 07/14/15 0410 07/14/15 2358 07/15/15 0613  WBC 32.0* 15.2* 12.5*  HGB 9.7* 6.3* 7.6*  HCT 30.8* 20.0* 23.2*  PLT 383 255 201   Coag's  Recent Labs Lab 07/14/15 0410  INR 3.38   BMET  Recent Labs Lab 07/14/15 0410 07/14/15 2358 07/15/15 0613  NA 135 144 144  K 3.4* 4.4 4.1  CL 99* 119* 111  CO2 14* 19* 25  BUN 71* 52* 49*  CREATININE 2.18* 1.23* 1.08*  GLUCOSE 195* 124* 118*   Electrolytes  Recent Labs Lab 07/14/15 0410 07/14/15 2358 07/15/15 0613  CALCIUM 8.6* 7.0* 6.9*  MG  --  2.2  --   PHOS  --  5.1*  --    Sepsis Markers  Recent Labs Lab 07/14/15 2044  LATICACIDVEN 6.4*   ABG  Recent Labs Lab 07/14/15 2045 07/14/15 2230 07/15/15 0508  PHART 6.98* 7.22* 7.49*  PCO2ART 41 38 29*  PO2ART 128* 133* 465*   Liver Enzymes  Recent Labs Lab 07/14/15 0410  AST 91*  ALT 30  ALKPHOS 110  BILITOT 0.8  ALBUMIN 3.6   Cardiac Enzymes No results for input(s): TROPONINI, PROBNP in the last 168 hours. Glucose  Recent Labs Lab 07/14/15 1803  GLUCAP 107*     Recent Results (from the past 240 hour(s))  Culture, blood (routine x 2)     Status: None (Preliminary result)   Collection Time: 07/14/15  6:53 AM  Result Value Ref Range Status   Specimen Description BLOOD RIGHT ANTECUBITAL  Final   Special Requests BOTTLES DRAWN AEROBIC AND ANAEROBIC 5CC  Final   Culture NO GROWTH 1 DAY  Final   Report Status PENDING  Incomplete  Culture, blood (routine x 2)     Status: None (Preliminary result)   Collection Time: 07/14/15  6:53 AM  Result Value Ref Range Status   Specimen Description BLOOD RIGHT ANTECUBITAL  Final   Special Requests BOTTLES DRAWN AEROBIC AND ANAEROBIC 5CC  Final   Culture NO GROWTH 1 DAY  Final   Report Status PENDING  Incomplete  MRSA PCR Screening     Status: None   Collection Time: 07/14/15  8:27 PM  Result Value Ref Range Status   MRSA by PCR NEGATIVE NEGATIVE Final     Comment:        The GeneXpert MRSA Assay (FDA approved for NASAL specimens only), is one component of a comprehensive MRSA colonization surveillance program. It is not intended to diagnose MRSA infection nor to guide or monitor treatment for MRSA infections.      Current facility-administered medications:  .  0.9 %  sodium chloride infusion, , Intravenous, Continuous, Algernon Huxley, MD .  Place/Maintain arterial line, , , Until Discontinued **AND** 0.9 %  sodium chloride infusion, , Intra-arterial, PRN, Flora Lipps, MD .  antiseptic oral rinse solution (CORINZ), 7 mL, Mouth Rinse, QID,  Laverle Hobby, MD, 7 mL at 07/15/15 1216 .  aspirin chewable tablet 81 mg, 81 mg, Oral, Daily, Harrie Foreman, MD, 81 mg at 07/15/15 1028 .  chlorhexidine gluconate (PERIDEX) 0.12 % solution 15 mL, 15 mL, Mouth Rinse, BID, Laverle Hobby, MD, 15 mL at 07/15/15 0747 .  famotidine (PEPCID) tablet 20 mg, 20 mg, Oral, Daily, Harrie Foreman, MD, 20 mg at 07/15/15 1028 .  fentaNYL 2561mg in NS 2549m(1011mml) infusion-PREMIX, 10 mcg/hr, Intravenous, Continuous, KurFlora LippsD, Last Rate: 22.5 mL/hr at 07/15/15 1238, 225 mcg/hr at 07/15/15 1238 .  lisinopril (PRINIVIL,ZESTRIL) tablet 20 mg, 20 mg, Oral, Daily, 20 mg at 07/14/15 0848 **AND** hydrochlorothiazide (MICROZIDE) capsule 12.5 mg, 12.5 mg, Oral, Daily, MicHarrie ForemanD, 12.5 mg at 07/14/15 0847 .  hydrocortisone sodium succinate (SOLU-CORTEF) 100 MG injection 50 mg, 50 mg, Intravenous, Q6H, Roxas Clymer, MD, 50 mg at 07/15/15 1216 .  HYDROmorphone (DILAUDID) injection 0.5 mg, 0.5 mg, Intravenous, Q3H PRN, MicHarrie ForemanD, 0.5 mg at 07/14/15 1342 .  HYDROmorphone (DILAUDID) tablet 1 mg, 1 mg, Oral, Q3H PRN, AruNicholes MangoD, 1 mg at 07/14/15 1430 .  labetalol (NORMODYNE,TRANDATE) injection 10 mg, 10 mg, Intravenous, Q2H PRN, MicHarrie ForemanD .  levofloxacin (LEVAQUIN) IVPB 750 mg, 750 mg, Intravenous, Q48H, Aruna Gouru,  MD, 750 mg at 07/15/15 1043 .  magnesium hydroxide (MILK OF MAGNESIA) suspension 30 mL, 30 mL, Oral, Daily PRN, MicHarrie ForemanD .  nicotine (NICODERM CQ - dosed in mg/24 hr) patch 7 mg, 7 mg, Transdermal, Daily, MicHarrie ForemanD, 7 mg at 07/15/15 1029 .  norepinephrine (LEVOPHED) 4mg61m D5W 250mL51mmix infusion, 0-40 mcg/min, Intravenous, Titrated, Aruna Gouru, MD, Last Rate: 67.5 mL/hr at 07/15/15 1115, 18 mcg/min at 07/15/15 1115 .  ondansetron (ZOFRAN) tablet 4 mg, 4 mg, Oral, Q6H PRN **OR** ondansetron (ZOFRAN) injection 4 mg, 4 mg, Intravenous, Q6H PRN, MichaHarrie Foreman 4 mg at 07/14/15 0646 .  oxyCODONE-acetaminophen (PERCOCET/ROXICET) 5-325 MG per tablet 1 tablet, 1 tablet, Oral, Q4H PRN, MichaHarrie Foreman 1 tablet at 07/14/15 1629 .  piperacillin-tazobactam (ZOSYN) IVPB 3.375 g, 3.375 g, Intravenous, 3 times per day, ArunaNicholes Mango 3.375 g at 07/15/15 0552 .  sodium bicarbonate 150 mEq in sterile water 1,000 mL infusion, , Intravenous, Continuous, PradeLaverle Hobby Last Rate: 150 mL/hr at 07/15/15 0727 .  sodium chloride 0.9 % injection 3 mL, 3 mL, Intravenous, Q12H, MichaHarrie Foreman 3 mL at 07/15/15 1045 .  vancomycin (VANCOCIN) 500 mg in sodium chloride 0.9 % 100 mL IVPB, 500 mg, Intravenous, Q18H, ArunaNicholes Mango IMAGING    Dg Chest 1 View  07/15/2015   CLINICAL DATA:  Dyspnea.  Ventilator.  EXAM: CHEST 1 VIEW  COMPARISON:  07/14/2015  FINDINGS: Endotracheal tube in good position. Left jugular central venous catheter tip in the SVC unchanged. NG tube in the stomach.  Extensive infiltrate throughout most of the right lung appears unchanged. Left lower lobe atelectasis unchanged.  Small right pleural effusion  COPD  IMPRESSION: Extensive infiltrate throughout the right lung is unchanged and most consistent with pneumonia. Small right effusion  Left lower lobe atelectasis unchanged  Endotracheal tube remains in good position.   Electronically Signed    By: CharlFranchot Gallo   On: 07/15/2015 07:04   Dg Abd 1 View  07/14/2015   CLINICAL DATA:  NG tube placement.  EXAM: ABDOMEN - 1 VIEW  COMPARISON:  Radiograph obtained yesterday.  FINDINGS: Tip and side port of the enteric tube below the diaphragm in the stomach. There is air within normal caliber amorphous bowel loops in the left abdomen, nonspecific. No bowel dilatation. No evidence of free air. Bi-iliac vascular stent in the lower abdomen. Interstitial change in the included lungs.  IMPRESSION: Tip and side port of the enteric tube below the diaphragm in the stomach.   Electronically Signed   By: Jeb Levering M.D.   On: 07/14/2015 21:55   Dg Chest Port 1 View  07/14/2015   CLINICAL DATA:  Post intubation films.  EXAM: PORTABLE CHEST 1 VIEW  COMPARISON:  February 12, 2015  FINDINGS: The heart size and mediastinal contours are within normal limits. Endotracheal tube is identified with distal tip 4.2 cm from carina. Left central venous line is identified with distal tip in the superior vena cava. Airspace opacities noted identified throughout both lungs with increased pulmonary interstitium. There is dense consolidation of left lung base with probable left pleural effusion. The visualized skeletal structures are stable.  IMPRESSION: Endotracheal tube in good position.  There is no pneumothorax.  Diffuse airspace opacities noted throughout bilateral lungs. Differential diagnosis includes pulmonary edema and bilateral pneumonias   Electronically Signed   By: Abelardo Diesel M.D.   On: 07/14/2015 21:10   Dg Abd Portable 2v  07/14/2015   CLINICAL DATA:  Abdominal pain. Blood in stool. Peripheral vascular disease.  EXAM: PORTABLE ABDOMEN - 2 VIEW  COMPARISON:  None.  FINDINGS: No evidence of dilated bowel loops or free air. Endovascular stent seen throughout the iliac arteries bilaterally. Diffuse interstitial infiltrates seen throughout the visualized portions of the lungs suspicious for diffuse pulmonary  edema.  IMPRESSION: Unremarkable bowel gas pattern.  Diffuse pulmonary interstitial infiltrates suspicious for edema.   Electronically Signed   By: Earle Gell M.D.   On: 07/14/2015 18:35      Indwelling Urinary Catheter continued, requirement due to   Reason to continue Indwelling Urinary Catheter for strict Intake/Output monitoring for hemodynamic instability   Central Line continued, requirement due to   Reason to continue [employer] Monitoring of central venous pressure or other hemodynamic parameters   Ventilator continued, requirement due to, resp failure    Ventilator Sedation RASS 0 to -2   MAJOR EVENTS/TEST RESULTS:    INDWELLING DEVICES:: 9/28 intubated 8.0 ETT>>> 9/28 Left IJ CVL>>> 9/28 RT FEM A LINE>>  MICRO DATA: MRSA PCR >> Blood x 2 9/28>>  ANTIMICROBIALS:  9/28 zosyn>> 9/28 Levaquin>> 9/28 Vanc>>    ASSESSMENT/PLAN   69 yo white female with underlying severe PVD with acute hypoxic resp failure likely from acute sepsis and metabolic acidosis with acute renal failure with acute left leg ischemia  PULMONARY -Respiratory Failure -continue Full MV support -continue Bronchodilator Therapy -Wean Fio2 and PEEP as tolerated -will perform SAT/SBt when respiratory parameters are met -Abx for possible RLL PNA   CARDIOVASCULAR MAP>65 -monitor hemodynamics.  -Ischemic leg - LLE - being evaluated by vascular>>rec to stablize inflammation by putting the leg in dry ice, then will consider amputation when more stable.   RENAL -Acute renal failure from ATN -follow UO, chem7 -may need HD -follow nephrology consult  GASTROINTESTINAL OG placed -keep NPO for potential procedure  HEMATOLOGIC Follow CBC Ischemic leg - LLE - being evaluated by vascular>>rec to stablize inflammation by putting the leg in dry ice, then will consider amputation when more stable.   INFECTIOUS -empiric abx -blood cultures  ENDOCRINE follow FSBS  NEUROLOGIC - intubated  and sedated - minimal sedation to achieve a RASS goal: -1 -use fentanyl infusion   EXT_ischemic lower ext -follow up vasc surgery recs -on heparin infusion   I have personally obtained a history, examined the patient, evaluated laboratory and independently reviewed  imaging results, formulated the assessment and plan and placed orders.  The Patient requires high complexity decision making for assessment and support, frequent evaluation and titration of therapies, application of advanced monitoring technologies and extensive interpretation of multiple databases. Critical Care Time devoted to patient care services described in this note is 45 minutes.   Overall, patient is critically ill, prognosis is guarded. Patient at high risk for cardiac arrest and death. Patient with multiorgan failure. Family updated Will assess daily for improvement  Vilinda Boehringer, MD [facility] Pager [phone] (please enter 7-digits) On Call Pager - [phone] (please enter 7-digits)

## 2015-07-15 NOTE — Progress Notes (Signed)
Fentanyl drip restarted because patient gagging against ET tube, following commands.

## 2015-07-15 NOTE — Progress Notes (Signed)
eLink Physician-Brief Progress Note Patient Name: [name] DOB: Jun 08, 1946 MRN: [id number]   Date of Service  07/15/2015  HPI/Events of Note  Anemia with lactic acidosis.   eICU Interventions  Ordered 2 units blood, increase bicarb drip to 150cc/hr.      Intervention Category Major Interventions: Acid-Base disturbance - evaluation and management;Shock - evaluation and management  Versa Craton 07/15/2015, 1:19 AM

## 2015-07-15 NOTE — Progress Notes (Signed)
Patient is actually alert and responsive on the ventilator. She remains on levophed for hemodynamic reasons. Her left leg is mottled and cold. I have recommended placing her leg on dry ice to stop inflammatory process to allow stabilization to get the patient to surgery. I have been told that our institution is unable to accommodate this request and we do not have the ability to do dry ice on the leg. Will do our best to stabilize patient medically, and when it is felt she is stable will consider high left AKA.  Very grave situation.

## 2015-07-15 NOTE — Progress Notes (Signed)
Initial Nutrition Assessment    INTERVENTION:   Coordination of Care: discussed nutritional poc during ICU rounds; per MD Mungal, holding off on initiation of TF as plan for CT abdomen today, continue to assess  NUTRITION DIAGNOSIS:   Inadequate oral intake related to acute illness as evidenced by NPO status.  GOAL:   Provide needs based on ASPEN/SCCM guidelines  MONITOR:    (Energy Intake, Anthropometrics, Digestive System, Electrolyte/Renal Profile)  REASON FOR ASSESSMENT:   Ventilator    ASSESSMENT:    Pt admitted with LLE ischemia, pneumonia, acute on CKD; rapid response yesterday, pt wit respiratory failure with metabolic acidosis requiring intubation,  plans for amputation when more medically stable,   Past Medical History  Diagnosis Date  . Peripheral vascular disease    Past Surgical History  Procedure Laterality Date  . Below knee leg amputation    . Stents left leg      Diet Order:  Diet NPO time specified   Food and Nutrition Related History: husband reports appetite and po intake has been good prior to admission  Skin:   (stage II pressure ulcer on heel)  Last BM:  9/28  Electrolyte and Renal Profile:  Recent Labs Lab 07/14/15 0410 07/14/15 2358 07/15/15 0613  BUN 71* 52* 49*  CREATININE 2.18* 1.23* 1.08*  NA 135 144 144  K 3.4* 4.4 4.1  MG  --  2.2  --   PHOS  --  5.1*  --    Glucose Profile:   Recent Labs  07/14/15 1803  GLUCAP 107*   Nutritional Anemia Profile:  CBC Latest Ref Rng 07/15/2015 07/14/2015 07/14/2015  WBC 3.6 - 11.0 K/uL 12.5(H) 15.2(H) 32.0(H)  Hemoglobin 12.0 - 16.0 g/dL 7.6(L) 6.3(L) 9.7(L)  Hematocrit 35.0 - 47.0 % 23.2(L) 20.0(L) 30.8(L)  Platelets 150 - 440 K/uL 201 255 383    Meds: reviewed  Nutrition Focused physical exam:  Unable to complete Nutrition-Focused physical exam at this time.    Height:   Ht Readings from Last 1 Encounters:  07/14/15  (1.651 m)    Weight: husband reports pt weight  has been stable; 86.8% IBW with amputation taken into account; IBW 53 kg  Wt Readings from Last 1 Encounters:  07/14/15 102 lb 1.6 oz (46.312 kg)    BMI:  Body mass index is 16.99 kg/(m^2).  Estimated Nutritional Needs:   Kcal:  1365 kcals (Ve: 13, Tmax: 37.3) using wt of 46.3 kg  Protein:  69-92 g (1.5-2.0 g/kg)   Fluid:  1380-1610 mL (25-30 ml/kg)   HIGH Care Level  Romelle Starcher MS, RD, LDN [phone] Pager

## 2015-07-15 NOTE — Progress Notes (Signed)
Notified Dr. Dema Severin of CVP of 5. He stated, "I am okay with that." Asked if fluids should be given to increase CVP, Dr. Dema Severin stated not at this time.

## 2015-07-15 NOTE — Progress Notes (Signed)
RN made Dr. Nicholos Johns aware of hgb 6.3 and that CVP is reading -11.  Dr. Nicholos Johns stated "I've already ordered for blood and increased bicarb drip rate." no new orders given.

## 2015-07-15 NOTE — Progress Notes (Signed)
ANTICOAGULATION CONSULT NOTE - Initial Consult  Pharmacy Consult for DVT Indication: DVT  Allergies  Allergen Reactions  . Morphine And Related Diarrhea and Nausea And Vomiting    Patient Measurements: Height:  (165.1 cm) Weight: 102 lb 1.6 oz (46.312 kg) IBW/kg (Calculated) : 57 Heparin Dosing Weight: 49.9 kg-->46.3 kg  Vital Signs: Temp: 97.8 F (36.6 C) (09/29 0345) Temp Source: Oral (09/29 0345) BP: 84/59 mmHg (09/29 0330) Pulse Rate: 102 (09/29 0345)  Labs:  Recent Labs  07/14/15 0410 07/14/15 1601 07/14/15 2033 07/14/15 2358  HGB 9.7*  --   --  6.3*  HCT 30.8*  --   --  20.0*  PLT 383  --   --  255  LABPROT 34.2*  --   --   --   INR 3.38  --   --   --   HEPARINUNFRC  --  0.52  --  1.13*  CREATININE 2.18*  --   --  1.23*  CKTOTAL  --   --  349*  --     Estimated Creatinine Clearance: 31.1 mL/min (by C-G formula based on Cr of 1.23).   Medical History: Past Medical History  Diagnosis Date  . Peripheral vascular disease     Medications:  Infusions:  . sodium chloride    . fentaNYL infusion INTRAVENOUS 150 mcg/hr (07/14/15 2337)  . heparin    . norepinephrine 12 mcg/min (07/15/15 0000)  .  sodium bicarbonate 150 mEq in sterile water 1000 mL infusion 150 mL/hr at 07/15/15 0150    Assessment: 69 yof currently on Coumadin with INR 3.38 developed LLE DVT, skin cool to touch per RN note. Vascular consult placed. Spoke with ED physician, wants to start heparin now even though INR therapeutic because LLE clotted thorough therapeutic VKA. Agreed no bolus, lower initial rate.  Goal of Therapy:  Heparin level 0.3-0.7 units/ml Monitor platelets by anticoagulation protocol: Yes   Plan:  Start heparin infusion at 800 units/hr Check anti-Xa level in 8 hours and daily while on heparin Continue to monitor H&H and platelets    Addendum: Heparin drip stopped per RN at 0820, looked like order was d/c'd (put in as once order). Spoke with Dr. Amado Coe, continue  heparin drip at this time.  Change in wt from 49.9 to 46.3 kg, will decrease heparin infusion rate to 750 units/hr (=7.5 ml/hr) as INR therapeutic to shoot for 16 units/kg/hr dosing.  CBC and INR ordered for AM.  Will retime heparin level for 1600 today.   9/28:   HL @ 16:00 = 0.52  Will continue pt on current rate of 750 units/hr and recheck HL in 8 hrs on 9/29 @ 00:00.   0928 2358 HL supratherapeutic, hold x 1 hour and restart at 550 units/hr. Lab ordered for 8 hours after resuming drip.   Carola Frost, Pharm.D.  Clinical Pharmacist 07/15/2015,3:51 AM

## 2015-07-15 NOTE — Progress Notes (Signed)
[facility] - [place] at [facility]   PATIENT NAME: [name]    MR#:  [id number]  DATE OF BIRTH:  Jan 02, 1946  SUBJECTIVE:  CHIEF COMPLAINT:  Patient was hypotensive and hypoxic last night and was transferred to ICU yesterday. Has ice cold extremity with no pulses  and mottling. Receiving PRBC for anemia. Got intubated last night for sob  REVIEW OF SYSTEMS:  Pt is on MV DRUG ALLERGIES:   Allergies  Allergen Reactions  . Morphine And Related Diarrhea and Nausea And Vomiting    VITALS:  Blood pressure 89/58, pulse 82, temperature 97.6 F (36.4 C), temperature source Oral, resp. rate 32, height  (1.651 m), weight 46.312 kg (102 lb 1.6 oz), SpO2 100 %.  PHYSICAL EXAMINATION:  GENERAL:  69 y.o.-year-old patient lying in the bed with no acute distress. Very uncomfortable from lower extent the pain EYES: Pupils equal, round, sluggishly reactive to light and accommodation. No scleral icterus.  HEENT: Head atraumatic, normocephalic. Oropharynx with ET tube NECK:  Supple, no jugular venous distention.  LUNGS: Diminished breath sounds on rt greater than left, no wheezing,or crepitation.  CARDIOVASCULAR: S1, S2 normal. No murmurs, rubs, or gallops.  ABDOMEN: Soft, nontender, nondistended. Bowel sounds present. No organomegaly or mass.  EXTREMITIES: Left lower extremity is very cold to touch with no pulses in popliteal area and dorsalis pedis, motled NEUROLOGIC: INTUBATED  PSYCHIATRIC: The patient is on MV SKIN: Is mottled left lower extremity    LABORATORY PANEL:   CBC  Recent Labs Lab 07/15/15 0613  WBC 12.5*  HGB 7.6*  HCT 23.2*  PLT 201   ------------------------------------------------------------------------------------------------------------------  Chemistries   Recent Labs Lab 07/14/15 0410 07/14/15 2358 07/15/15 0613  NA 135 144 144  K 3.4* 4.4 4.1  CL 99* 119* 111  CO2 14* 19* 25  GLUCOSE 195* 124* 118*  BUN 71* 52* 49*   CREATININE 2.18* 1.23* 1.08*  CALCIUM 8.6* 7.0* 6.9*  MG  --  2.2  --   AST 91*  --   --   ALT 30  --   --   ALKPHOS 110  --   --   BILITOT 0.8  --   --    ------------------------------------------------------------------------------------------------------------------  Cardiac Enzymes No results for input(s): TROPONINI in the last 168 hours. ------------------------------------------------------------------------------------------------------------------  RADIOLOGY:  Dg Chest 1 View  07/15/2015   CLINICAL DATA:  Dyspnea.  Ventilator.  EXAM: CHEST 1 VIEW  COMPARISON:  07/14/2015  FINDINGS: Endotracheal tube in good position. Left jugular central venous catheter tip in the SVC unchanged. NG tube in the stomach.  Extensive infiltrate throughout most of the right lung appears unchanged. Left lower lobe atelectasis unchanged.  Small right pleural effusion  COPD  IMPRESSION: Extensive infiltrate throughout the right lung is unchanged and most consistent with pneumonia. Small right effusion  Left lower lobe atelectasis unchanged  Endotracheal tube remains in good position.   Electronically Signed   By: Marlan Palau M.D.   On: 07/15/2015 07:04   Dg Abd 1 View  07/14/2015   CLINICAL DATA:  NG tube placement.  EXAM: ABDOMEN - 1 VIEW  COMPARISON:  Radiograph obtained yesterday.  FINDINGS: Tip and side port of the enteric tube below the diaphragm in the stomach. There is air within normal caliber amorphous bowel loops in the left abdomen, nonspecific. No bowel dilatation. No evidence of free air. Bi-iliac vascular stent in the lower abdomen. Interstitial change in the included lungs.  IMPRESSION: Tip and side port  of the enteric tube below the diaphragm in the stomach.   Electronically Signed   By: Rubye Oaks M.D.   On: 07/14/2015 21:55   Dg Chest Port 1 View  07/14/2015   CLINICAL DATA:  Post intubation films.  EXAM: PORTABLE CHEST 1 VIEW  COMPARISON:  February 12, 2015  FINDINGS: The heart size  and mediastinal contours are within normal limits. Endotracheal tube is identified with distal tip 4.2 cm from carina. Left central venous line is identified with distal tip in the superior vena cava. Airspace opacities noted identified throughout both lungs with increased pulmonary interstitium. There is dense consolidation of left lung base with probable left pleural effusion. The visualized skeletal structures are stable.  IMPRESSION: Endotracheal tube in good position.  There is no pneumothorax.  Diffuse airspace opacities noted throughout bilateral lungs. Differential diagnosis includes pulmonary edema and bilateral pneumonias   Electronically Signed   By: Sherian Rein M.D.   On: 07/14/2015 21:10   Dg Abd Portable 2v  07/14/2015   CLINICAL DATA:  Abdominal pain. Blood in stool. Peripheral vascular disease.  EXAM: PORTABLE ABDOMEN - 2 VIEW  COMPARISON:  None.  FINDINGS: No evidence of dilated bowel loops or free air. Endovascular stent seen throughout the iliac arteries bilaterally. Diffuse interstitial infiltrates seen throughout the visualized portions of the lungs suspicious for diffuse pulmonary edema.  IMPRESSION: Unremarkable bowel gas pattern.  Diffuse pulmonary interstitial infiltrates suspicious for edema.   Electronically Signed   By: Myles Rosenthal M.D.   On: 07/14/2015 18:35    EKG:   Orders placed or performed in visit on 11/06/14  . EKG 12-Lead    ASSESSMENT AND PLAN:   This is a 69 year old female admitted for arterial thrombus of the LLE.  1. Septic shock his metabolic acidosis with PNA and prob LE infection  Pressors to titrate map greater than or equal to 65   continue hydration with IVF Broad spectrum abx coverage with Zosyn, Levaquin and vancomycin  Consult ID  2. Left lower extremity with severe ischemia , elevated CK at 11,000  LLE pulseless, dusky, and very painful yesterday from severe ischemia  H/o stents to extremity. D/C HEparin gtt in view of anemia  Angiogram  and surgery  are rescheduled for a later date as patient is critically ill ,discussed with Dr. dew personally Dr. Wyn Quaker is not quite sure whether her limb is salvageable are not, patient and her daughter are aware of the situation   recommending dry eyes to the lower extremity to prevent spreading of toxins from the left lower extremity   2. Severe anemia  Not quite sure whether the patient is having retroperitoneal bleed as she was complaining of abdominal pain yesterday Cannot do portable CAT scan at bedside, which was discussed with the 2 daughters for healthcare power of attorney Not considering to take the patient to radiology unit to get the CAT scan as she is critically ill, daughters are aware of this and agrees with the plan  3. Acute  respiratory failure secondary to Pneumonia: community-acquired;   Patient is intubated yesterday on September 28 Status post central line and arterial line Appreciate critical care recommendations Vent management per critical care team Continue broad-spectrum antibiotic Zosyn, Levaquin and vancomycin. Levofloxacin and recommends a started today ID consult is placed  4. 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 Renal function significantly improved with the aggressive hydration  avoid nephrotoxic agents.  Appreciate nephrology recommendations    5.  Tobacco abuse: Patient still continues to smoke. Counseled patient to quit smoking for 3-5 minutes prior to intubation . We will provide Nicoderm patch 6. DVT pxs: heparin drip is discontinued in view of severe anemia 7. GI pxs: none The patient is a DO NOT RESUSCITATE      All the records are reviewed and case discussed with Care Management/Social Workerr. Management plans discussed with the  family and they are in agreement.  CODE STATUS:DO NOT RESUSCITATE, 2 daughters at the healthcare power of attorney   TOTAL  critical care TIME TAKING CARE OF THIS PATIENT: 35 minutes.    POSSIBLE D/C IN ?DAYS, DEPENDING ON CLINICAL CONDITION.   Ramonita Lab M.D on 07/15/2015 at 9:20 AM  Between 7am to 6pm - Pager - [phone] After 6pm go to [email or web address] - password [email or web address] [facility]  [place] [facility]  Office  [phone]  CC: Primary care physician; No primary care provider on file.

## 2015-07-15 NOTE — Progress Notes (Signed)
[facility]  Daily Progress Note   Subjective  - * No surgery date entered *  Patient had respiratory failure overnight and is now on the ventilator. Her hypotension has been refractory and she is now on 15 mcgs of levophed. Left lower extremity ischemia remains present. Renal function has significantly improved.  Objective Filed Vitals:   07/15/15 0815 07/15/15 0830 07/15/15 0845 07/15/15 0900  BP: 89/58  Pulse: 89 86 82   Temp:    97.6 F (36.4 C)  TempSrc:    Oral  Resp: 32 32 32 32  Height:      Weight:      SpO2: 97% 98% 100% 100%    Intake/Output Summary (Last 24 hours) at 07/15/15 0948 Last data filed at 07/15/15 0900  Gross per 24 hour  Intake 5704.6 ml  Output   1150 ml  Net 4554.6 ml    PULM  coarse breath sounds with tachypnea on the ventilator CV  RRR, tachycardic today VASC  left lower extremity is clearly ischemic with mottling up to the upper thigh. Only a weak femoral pulse is present and no capillary refill is seen from the lower thigh down.  Laboratory CBC    Component Value Date/Time   WBC 12.5* 07/15/2015 0613   WBC 10.4 02/12/2015 1641   HGB 7.6* 07/15/2015 0613   HGB 8.6* 02/12/2015 1641   HCT 23.2* 07/15/2015 0613   HCT 25.9* 02/12/2015 1641   PLT 201 07/15/2015 0613   PLT 343 02/12/2015 1641    BMET    Component Value Date/Time   NA 144 07/15/2015 0613   NA 131* 02/13/2015 0426   K 4.1 07/15/2015 0613   K 3.0* 02/13/2015 0426   CL 111 07/15/2015 0613   CL 103 02/13/2015 0426   CO2 25 07/15/2015 0613   CO2 23 02/13/2015 0426   GLUCOSE 118* 07/15/2015 0613   GLUCOSE 104* 02/13/2015 0426   BUN 49* 07/15/2015 0613   BUN 10 02/13/2015 0426   CREATININE 1.08* 07/15/2015 0613   CREATININE 0.83 02/13/2015 0426   CALCIUM 6.9* 07/15/2015 0613   CALCIUM 7.3* 02/13/2015 0426   GFRNONAA 51* 07/15/2015 0613   GFRNONAA >60 02/13/2015 0426   GFRNONAA 51* 11/08/2014 0618   GFRAA 59* 07/15/2015 0613    GFRAA >60 02/13/2015 0426   GFRAA >60 11/08/2014 0618    Assessment/Planning:    Patient is critically ill with severe pneumonia, sepsis, hypotension requiring pressors, and respiratory failure requiring ventilator.  Ischemic left lower extremity. Situation significantly compromised by above problems. Patient is not well enough to come down for angiography and the leg appears as if it will be nonviable at this point. With the significant improvement in her renal function, it does not appear as if she has severe muscle breakdown now but would consider ordering myoglobins and CKs for further evaluation. The patient's left lower extremity may need to be put on Dry Ice until she is stable enough to go for amputation. 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 The family understands that her leg is likely not salvageable at this point. She may still require some sort of intervention to improve her iliac flow to heal the above-knee amputation. The fact that she is on pressors is making her ischemia worse, but this is necessary for her overall survival at this point.  This is a very grave and difficult situation with a high risk of morbidity and mortality.  I had a long talk with the family this morning to discuss the situation.    Bern Fare  07/15/2015, 9:48 AM

## 2015-07-16 ENCOUNTER — Encounter
Admission: EM | Disposition: A | Discharge status: Skilled Nursing Facility | Payer: Self-pay | Source: Home / Self Care | Attending: Internal Medicine

## 2015-07-16 ENCOUNTER — Encounter: Payer: Medicare Other | Admitting: Physical Therapy

## 2015-07-16 ENCOUNTER — Encounter: Payer: Self-pay | Admitting: Vascular Surgery

## 2015-07-16 ENCOUNTER — Inpatient Hospital Stay: Payer: Medicare Other | Admitting: Anesthesiology

## 2015-07-16 ENCOUNTER — Ambulatory Visit: Admit: 2015-07-16 | Payer: Self-pay | Admitting: Vascular Surgery

## 2015-07-16 DIAGNOSIS — J9601 Acute respiratory failure with hypoxia: Secondary | ICD-10-CM

## 2015-07-16 DIAGNOSIS — Z5189 Encounter for other specified aftercare: Secondary | ICD-10-CM | POA: Diagnosis not present

## 2015-07-16 DIAGNOSIS — R29818 Other symptoms and signs involving the nervous system: Secondary | ICD-10-CM | POA: Diagnosis not present

## 2015-07-16 DIAGNOSIS — R531 Weakness: Secondary | ICD-10-CM | POA: Diagnosis not present

## 2015-07-16 DIAGNOSIS — R269 Unspecified abnormalities of gait and mobility: Secondary | ICD-10-CM | POA: Diagnosis not present

## 2015-07-16 DIAGNOSIS — R2681 Unsteadiness on feet: Secondary | ICD-10-CM | POA: Diagnosis not present

## 2015-07-16 DIAGNOSIS — R6889 Other general symptoms and signs: Secondary | ICD-10-CM | POA: Diagnosis not present

## 2015-07-16 DIAGNOSIS — Z89611 Acquired absence of right leg above knee: Secondary | ICD-10-CM | POA: Diagnosis not present

## 2015-07-16 HISTORY — PX: AMPUTATION: SHX166

## 2015-07-16 LAB — BASIC METABOLIC PANEL
Anion gap: 10 (ref 5–15)
BUN: 27 mg/dL — ABNORMAL HIGH (ref 6–20)
CO2: 27 mmol/L (ref 22–32)
Calcium: 6.9 mg/dL — ABNORMAL LOW (ref 8.9–10.3)
Chloride: 101 mmol/L (ref 101–111)
Creatinine, Ser: 1.05 mg/dL — ABNORMAL HIGH (ref 0.44–1.00)
GFR calc Af Amer: >60 mL/min (ref >60)
GFR calc non Af Amer: 53 mL/min — ABNORMAL LOW (ref >60)
Glucose, Bld: 122 mg/dL — ABNORMAL HIGH (ref 65–99)
Potassium: 3.2 mmol/L — ABNORMAL LOW (ref 3.5–5.1)
Sodium: 138 mmol/L (ref 135–145)

## 2015-07-16 LAB — CBC
HCT: 24.9 % — ABNORMAL LOW (ref 35.0–47.0)
Hemoglobin: 8.4 g/dL — ABNORMAL LOW (ref 12.0–16.0)
MCH: 29 pg (ref 26.0–34.0)
MCHC: 33.8 g/dL (ref 32.0–36.0)
MCV: 85.9 fL (ref 80.0–100.0)
Platelets: 162 10*3/uL (ref 150–440)
RBC: 2.9 MIL/uL — ABNORMAL LOW (ref 3.80–5.20)
RDW: 16.9 % — ABNORMAL HIGH (ref 11.5–14.5)
WBC: 13.4 10*3/uL — ABNORMAL HIGH (ref 3.6–11.0)

## 2015-07-16 LAB — TYPE AND SCREEN
ABO/RH(D): O POS
Antibody Screen: NEGATIVE
Unit division: 0
Unit division: 0

## 2015-07-16 LAB — PROTIME-INR
INR: 6.75
INR: 1.7
Prothrombin Time: 58.2 seconds — ABNORMAL HIGH (ref 11.4–15.0)
Prothrombin Time: 20.2 seconds — ABNORMAL HIGH (ref 11.4–15.0)

## 2015-07-16 LAB — MYOGLOBIN, SERUM: Myoglobin: 25492 ng/mL — ABNORMAL HIGH (ref 25–58)

## 2015-07-16 LAB — CALCIUM, IONIZED: Calcium, Ionized, Serum: 3.7 mg/dL — ABNORMAL LOW (ref 4.5–5.6)

## 2015-07-16 SURGERY — LOWER EXTREMITY ANGIOGRAPHY
Anesthesia: Moderate Sedation | Laterality: Left

## 2015-07-16 SURGERY — AMPUTATION BELOW KNEE
Anesthesia: General | Site: Leg Lower | Laterality: Left | Wound class: Clean Contaminated

## 2015-07-16 MED ORDER — LACTATED RINGERS IV SOLN
INTRAVENOUS | Status: DC | PRN
  Administered 2015-07-16: 18:00:00 via INTRAVENOUS

## 2015-07-16 MED ORDER — PHENYLEPHRINE HCL 10 MG/ML IJ SOLN
INTRAMUSCULAR | Status: DC | PRN
  Administered 2015-07-16: 200 ug via INTRAVENOUS

## 2015-07-16 MED ORDER — MIDAZOLAM HCL 2 MG/2ML IJ SOLN
INTRAMUSCULAR | Status: DC | PRN
  Administered 2015-07-16: 2 mg via INTRAVENOUS

## 2015-07-16 MED ORDER — VITAL AF 1.2 CAL PO LIQD
1000.0000 mL | ORAL | Status: DC
  Administered 2015-07-17: 1000 mL

## 2015-07-16 MED ORDER — FENTANYL CITRATE (PF) 100 MCG/2ML IJ SOLN
INTRAMUSCULAR | Status: DC | PRN
  Administered 2015-07-16 (×2): 50 ug via INTRAVENOUS

## 2015-07-16 MED ORDER — POTASSIUM CHLORIDE 10 MEQ/100ML IV SOLN
10.0000 meq | INTRAVENOUS | Status: AC
  Administered 2015-07-16 (×4): 10 meq via INTRAVENOUS
  Filled 2015-07-16 (×4): qty 100

## 2015-07-16 MED ORDER — SODIUM CHLORIDE 0.9 % IV SOLN
Freq: Once | INTRAVENOUS | Status: AC
  Administered 2015-07-16: 12:00:00 via INTRAVENOUS

## 2015-07-16 MED ORDER — FREE WATER
200.0000 mL | Freq: Three times a day (TID) | Status: DC
  Administered 2015-07-17 (×2): 200 mL

## 2015-07-16 SURGICAL SUPPLY — 33 items
BAG COUNTER SPONGE EZ (MISCELLANEOUS) ×2 IMPLANT
BANDAGE ELASTIC 6 CLIP NS LF (GAUZE/BANDAGES/DRESSINGS) ×2 IMPLANT
BLADE SAW GIGLI 510 (BLADE) ×2 IMPLANT
BLADE SURG SZ10 CARB STEEL (BLADE) ×2 IMPLANT
BNDG COHESIVE 4X5 TAN STRL (GAUZE/BANDAGES/DRESSINGS) ×2 IMPLANT
BNDG GAUZE 4.5X4.1 6PLY STRL (MISCELLANEOUS) ×4 IMPLANT
CANISTER SUCT 1200ML W/VALVE (MISCELLANEOUS) ×2 IMPLANT
CHLORAPREP W/TINT 26ML (MISCELLANEOUS) ×4 IMPLANT
ELECT CAUTERY BLADE 6.4 (BLADE) ×2 IMPLANT
GAUZE FLUFF 18X24 1PLY STRL (GAUZE/BANDAGES/DRESSINGS) ×2 IMPLANT
GAUZE PETRO XEROFOAM 1X8 (MISCELLANEOUS) ×2 IMPLANT
GLOVE SURG SYN 8.0 (GLOVE) ×2 IMPLANT
GOWN STRL REUS W/ TWL LRG LVL3 (GOWN DISPOSABLE) ×1 IMPLANT
GOWN STRL REUS W/ TWL XL LVL3 (GOWN DISPOSABLE) ×1 IMPLANT
GOWN STRL REUS W/TWL LRG LVL3 (GOWN DISPOSABLE) ×1
GOWN STRL REUS W/TWL XL LVL3 (GOWN DISPOSABLE) ×1
HANDLE YANKAUER SUCT BULB TIP (MISCELLANEOUS) ×2 IMPLANT
KIT RM TURNOVER STRD PROC AR (KITS) ×2 IMPLANT
NS IRRIG 500ML POUR BTL (IV SOLUTION) ×2 IMPLANT
PACK EXTREMITY ARMC (MISCELLANEOUS) ×2 IMPLANT
PAD GROUND ADULT SPLIT (MISCELLANEOUS) ×2 IMPLANT
PAD PREP 24X41 OB/GYN DISP (PERSONAL CARE ITEMS) ×2 IMPLANT
SPONGE LAP 18X18 5 PK (GAUZE/BANDAGES/DRESSINGS) ×4 IMPLANT
STAPLER SKIN PROX 35W (STAPLE) IMPLANT
STOCKINETTE M/LG 89821 (MISCELLANEOUS) ×2 IMPLANT
SUT ETHIBOND 0 36 GRN (SUTURE) ×2 IMPLANT
SUT SILK 2 0 (SUTURE) ×1
SUT SILK 2-0 18XBRD TIE 12 (SUTURE) ×1 IMPLANT
SUT VIC AB 0 CT1 36 (SUTURE) ×10 IMPLANT
SUT VIC AB 3-0 SH 27 (SUTURE) ×4
SUT VIC AB 3-0 SH 27X BRD (SUTURE) ×4 IMPLANT
SUT VICRYL PLUS ABS 0 54 (SUTURE) ×2 IMPLANT
TAPE UMBIL 1/8X18 RADIOPA (MISCELLANEOUS) ×2 IMPLANT

## 2015-07-16 NOTE — Progress Notes (Signed)
[facility] - [place] at [facility]   PATIENT NAME: [name]    MR#:  [id number]  DATE OF BIRTH:  1946/01/11  SUBJECTIVE:  CHIEF COMPLAINT:  Patient was hypotensive and hypoxic on 9/28 and was transferred to ICU yesterday. Has ice cold extremity with no pulses  and mottling. Received  PRBC for anemia yesterday and fresh frozen plasma today. Got intubated l 928 for sob Today during my examination patient is wide awake on vent and smiling. Awaiting for angiogram today  REVIEW OF SYSTEMS:  Pt is on MV DRUG ALLERGIES:   Allergies  Allergen Reactions  . Morphine And Related Diarrhea and Nausea And Vomiting    VITALS:  Blood pressure 106/66, pulse 97, temperature 99.3 F (37.4 C), temperature source Oral, resp. rate 32, height  (1.651 m), weight 51.6 kg (113 lb 12.1 oz), SpO2 100 %.  PHYSICAL EXAMINATION:  GENERAL:  69 y.o.-year-old patient lying in the bed with no acute distress. Very uncomfortable from lower extent the pain EYES: Pupils equal, round, sluggishly reactive to light and accommodation. No scleral icterus.  HEENT: Head atraumatic, normocephalic. Oropharynx with ET tube NECK:  Supple, no jugular venous distention.  LUNGS: Diminished breath sounds on rt greater than left, no wheezing,or crepitation.  CARDIOVASCULAR: S1, S2 normal. No murmurs, rubs, or gallops.  ABDOMEN: Soft, nontender, nondistended. Bowel sounds present. No organomegaly or mass.  EXTREMITIES: Left lower extremity is very cold to touch with no pulses in popliteal area and dorsalis pedis, motled NEUROLOGIC: INTUBATED  PSYCHIATRIC: The patient is on MV SKIN: Is mottled left lower extremity    LABORATORY PANEL:   CBC  Recent Labs Lab 07/16/15 0510  WBC 13.4*  HGB 8.4*  HCT 24.9*  PLT 162   ------------------------------------------------------------------------------------------------------------------  Chemistries   Recent Labs Lab 07/14/15 0410  07/14/15 2358  07/16/15 0510  NA 135 144  < > 138  K 3.4* 4.4  < > 3.2*  CL 99* 119*  < > 101  CO2 14* 19*  < > 27  GLUCOSE 195* 124*  < > 122*  BUN 71* 52*  < > 27*  CREATININE 2.18* 1.23*  < > 1.05*  CALCIUM 8.6* 7.0*  < > 6.9*  MG  --  2.2  --   --   AST 91*  --   --   --   ALT 30  --   --   --   ALKPHOS 110  --   --   --   BILITOT 0.8  --   --   --   < > = values in this interval not displayed. ------------------------------------------------------------------------------------------------------------------  Cardiac Enzymes No results for input(s): TROPONINI in the last 168 hours. ------------------------------------------------------------------------------------------------------------------  RADIOLOGY:  Dg Chest 1 View  07/15/2015   CLINICAL DATA:  Dyspnea.  Ventilator.  EXAM: CHEST 1 VIEW  COMPARISON:  07/14/2015  FINDINGS: Endotracheal tube in good position. Left jugular central venous catheter tip in the SVC unchanged. NG tube in the stomach.  Extensive infiltrate throughout most of the right lung appears unchanged. Left lower lobe atelectasis unchanged.  Small right pleural effusion  COPD  IMPRESSION: Extensive infiltrate throughout the right lung is unchanged and most consistent with pneumonia. Small right effusion  Left lower lobe atelectasis unchanged  Endotracheal tube remains in good position.   Electronically Signed   By: Marlan Palau M.D.   On: 07/15/2015 07:04   Dg Abd 1 View  07/14/2015   CLINICAL DATA:  NG  tube placement.  EXAM: ABDOMEN - 1 VIEW  COMPARISON:  Radiograph obtained yesterday.  FINDINGS: Tip and side port of the enteric tube below the diaphragm in the stomach. There is air within normal caliber amorphous bowel loops in the left abdomen, nonspecific. No bowel dilatation. No evidence of free air. Bi-iliac vascular stent in the lower abdomen. Interstitial change in the included lungs.  IMPRESSION: Tip and side port of the enteric tube below the diaphragm  in the stomach.   Electronically Signed   By: Rubye Oaks M.D.   On: 07/14/2015 21:55   Dg Chest Port 1 View  07/14/2015   CLINICAL DATA:  Post intubation films.  EXAM: PORTABLE CHEST 1 VIEW  COMPARISON:  February 12, 2015  FINDINGS: The heart size and mediastinal contours are within normal limits. Endotracheal tube is identified with distal tip 4.2 cm from carina. Left central venous line is identified with distal tip in the superior vena cava. Airspace opacities noted identified throughout both lungs with increased pulmonary interstitium. There is dense consolidation of left lung base with probable left pleural effusion. The visualized skeletal structures are stable.  IMPRESSION: Endotracheal tube in good position.  There is no pneumothorax.  Diffuse airspace opacities noted throughout bilateral lungs. Differential diagnosis includes pulmonary edema and bilateral pneumonias   Electronically Signed   By: Sherian Rein M.D.   On: 07/14/2015 21:10   Dg Abd Portable 1v  07/15/2015   CLINICAL DATA:  Nausea and vomiting  EXAM: PORTABLE ABDOMEN - 1 VIEW  COMPARISON:  07/14/2015  FINDINGS: Scattered large and small bowel gas is noted. A nasogastric catheter is noted within the stomach. Multiple arterial stents are seen bilaterally. Femoral arterial catheter is noted. No free air is seen. No bony abnormality is noted.  IMPRESSION: No acute abnormality seen.   Electronically Signed   By: Alcide Clever M.D.   On: 07/15/2015 17:08   Dg Abd Portable 2v  07/14/2015   CLINICAL DATA:  Abdominal pain. Blood in stool. Peripheral vascular disease.  EXAM: PORTABLE ABDOMEN - 2 VIEW  COMPARISON:  None.  FINDINGS: No evidence of dilated bowel loops or free air. Endovascular stent seen throughout the iliac arteries bilaterally. Diffuse interstitial infiltrates seen throughout the visualized portions of the lungs suspicious for diffuse pulmonary edema.  IMPRESSION: Unremarkable bowel gas pattern.  Diffuse pulmonary interstitial  infiltrates suspicious for edema.   Electronically Signed   By: Myles Rosenthal M.D.   On: 07/14/2015 18:35    EKG:   Orders placed or performed in visit on 11/06/14  . EKG 12-Lead    ASSESSMENT AND PLAN:   This is a 69 year old female admitted for arterial thrombus of the LLE.  1. Septic shock his metabolic acidosis with PNA and prob LE infection  Pressors to titrate map greater than or equal to 65   continue hydration with IVF Broad spectrum abx coverage with Zosyn, Levaquin and vancomycin  Consult ID is pending  2. Left lower extremity with severe ischemia , elevated CK  LLE pulseless, dusky, and very painful from severe ischemia  H/o stents to extremity. D/C HEparin gtt in view of anemia  Angiogram is scheduled for today afternoon  Dr. Wyn Quaker is not quite sure whether her limb is salvageable are not, patient and her daughter are aware of the situation     2. Severe anemia  Status post blood transfusion and hemoglobin is at 8.4 today. Will continue close monitoring  3. Acute  respiratory failure secondary  to Pneumonia: community-acquired;   Patient is intubated  on September 28 Status post central line and arterial line Appreciate critical care recommendations Vent management per critical care team Continue broad-spectrum antibiotic Zosyn, Levaquin and vancomycin. Levofloxacin and recommends a started today ID consult is pending  4. 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 Renal function significantly improved with the aggressive hydration  avoid nephrotoxic agents.  Appreciate nephrology recommendations    5. Tobacco abuse: Patient still continues to smoke. Counseled patient to quit smoking for 3-5 minutes prior to intubation . We will provide Nicoderm patch 6. DVT pxs: heparin drip is discontinued in view of severe anemia 7. GI pxs: none The patient is a DO NOT RESUSCITATE      All the records are reviewed and case discussed with Care  Management/Social Workerr. Management plans discussed with the  family and they are in agreement.  CODE STATUS:DO NOT RESUSCITATE, 2 daughters at the healthcare power of attorney   TOTAL  critical care TIME TAKING CARE OF THIS PATIENT: 35 minutes.   POSSIBLE D/C IN ?DAYS, DEPENDING ON CLINICAL CONDITION.   Ramonita Lab M.D on 07/16/2015 at 2:16 PM  Between 7am to 6pm - Pager - [phone] After 6pm go to [email or web address] - password [email or web address] [facility]  [place] [facility]  Office  [phone]  CC: Primary care physician; No primary care provider on file.

## 2015-07-16 NOTE — Progress Notes (Signed)
[facility] Progess Note    ASSESSMENT/PLAN       ASSESSMENT/PLAN   69 yo white female with underlying severe PVD with acute hypoxic resp failure likely from acute sepsis and metabolic acidosis with acute renal failure with acute left leg ischemia  PULMONARY -Respiratory Failure -continue Full MV support -continue Bronchodilator Therapy -Wean Fio2 and PEEP as tolerated -will perform SAT/SBt when respiratory parameters are met -Abx for possible RLL PNA   CARDIOVASCULAR MAP>65 -monitor hemodynamics.  -Ischemic leg - LLE - being evaluated by vascular>>rec to stablize inflammation by putting the leg in dry ice, then will consider amputation when more stable.   RENAL -Acute renal failure from ATN -follow UO, chem7 -may need HD -follow nephrology consult  GASTROINTESTINAL OG placed -keep NPO for potential procedure  HEMATOLOGIC Follow CBC Ischemic leg - LLE - being evaluated by vascular>>rec to stablize inflammation by putting the leg in dry ice, then will consider amputation when more stable.   INFECTIOUS -empiric abx -blood cultures  ENDOCRINE follow FSBS  NEUROLOGIC - intubated and sedated - minimal sedation to achieve a RASS goal: -1 -use fentanyl infusion   EXT_ischemic lower ext -follow up vasc surgery recs -on heparin infusion      ANTIMICROBIALS:   MAJOR EVENTS/TEST RESULTS:    INDWELLING DEVICES:: 9/28 intubated 8.0 ETT>>> 9/28 Left IJ CVL>>> 9/28 RT FEM A LINE>>  MICRO DATA: MRSA PCR >> Blood x 2 9/28>>  ANTIMICROBIALS:  9/28 zosyn>> 9/28 Levaquin>> 9/28 Vanc>>  ---------------------------------------   ----------------------------------------   Name: [name] MRN: [id number] DOB: 1945/11/13    ADMISSION DATE:  07/14/2015    CHIEF COMPLAINT:  Dyspnea   STUDIES:     SUBJECTIVE:  Patient is currently awake and alert on the ventilator. She appears to respond to some questions  and follow commands.   Review of Systems:  Pt currently on the ventilator, can not provide history or review of systems.    VITAL SIGNS: Temp:  [98.3 F (36.8 C)-99.3 F (37.4 C)] 99.3 F (37.4 C) (09/30 1400) Pulse Rate:  [69-130] 97 (09/30 1400) Resp:  [15-32] 32 (09/30 1400) BP: (79-125)/(39-79) 106/66 mmHg (09/30 1400) SpO2:  [99 %-100 %] 100 % (09/30 1400) Arterial Line BP: (104-162)/(49-77) 123/56 mmHg (09/30 1400) FiO2 (%):  [30 %] 30 % (09/30 1200) Weight:  [51.6 kg (113 lb 12.1 oz)] 51.6 kg (113 lb 12.1 oz) (09/30 0600) HEMODYNAMICS: CVP:  [11 mmHg-15 mmHg] 11 mmHg VENTILATOR SETTINGS: Vent Mode:  [-] PRVC FiO2 (%):  [30 %] 30 % Set Rate:  [32 bmp] 32 bmp Vt Set:  [400 mL] 400 mL PEEP:  [5 cmH20] 5 cmH20 INTAKE / OUTPUT:  Intake/Output Summary (Last 24 hours) at 07/16/15 1432 Last data filed at 07/16/15 1355  Gross per 24 hour  Intake 5408.64 ml  Output   1145 ml  Net 4263.64 ml    PHYSICAL EXAMINATION: Physical Examination:   VS: BP 106/66 mmHg  Pulse 97  Temp(Src) 99.3 F (37.4 C) (Oral)  Resp 32  Ht 5' 5"  (1.651 m)  Wt 51.6 kg (113 lb 12.1 oz)  BMI 18.93 kg/m2  SpO2 100%  General Appearance: No distress  Neuro:without focal findings, mental status normal. HEENT: PERRLA, EOM intact. Pulmonary: normal breath sounds   CardiovascularNormal S1,S2.  No m/r/g.   Abdomen: Benign, Soft, non-tender. Renal:  No costovertebral tenderness  GU:  Not performed at this time. Endocrine: No evident thyromegaly. Skin:   warm, no rashes, no ecchymosis  Extremities: normal, no cyanosis, clubbing.   LABS:   LABORATORY PANEL:   CBC  Recent Labs Lab 07/16/15 0510  WBC 13.4*  HGB 8.4*  HCT 24.9*  PLT 162    Chemistries   Recent Labs Lab 07/14/15 0410 07/14/15 2358  07/16/15 0510  NA 135 144  < > 138  K 3.4* 4.4  < > 3.2*  CL 99* 119*  < > 101  CO2 14* 19*  < > 27  GLUCOSE 195* 124*  < > 122*  BUN 71* 52*  < > 27*  CREATININE 2.18* 1.23*  <  > 1.05*  CALCIUM 8.6* 7.0*  < > 6.9*  MG  --  2.2  --   --   PHOS  --  5.1*  --   --   AST 91*  --   --   --   ALT 30  --   --   --   ALKPHOS 110  --   --   --   BILITOT 0.8  --   --   --   < > = values in this interval not displayed.   Recent Labs Lab 07/14/15 1803  GLUCAP 107*    Recent Labs Lab 07/14/15 2045 07/14/15 2230 07/15/15 0508  PHART 6.98* 7.22* 7.49*  PCO2ART 41 38 29*  PO2ART 128* 133* 465*    Recent Labs Lab 07/14/15 0410  AST 91*  ALT 30  ALKPHOS 110  BILITOT 0.8  ALBUMIN 3.6    Cardiac Enzymes No results for input(s): TROPONINI in the last 168 hours.  RADIOLOGY:  Dg Chest 1 View  07/15/2015   CLINICAL DATA:  Dyspnea.  Ventilator.  EXAM: CHEST 1 VIEW  COMPARISON:  07/14/2015  FINDINGS: Endotracheal tube in good position. Left jugular central venous catheter tip in the SVC unchanged. NG tube in the stomach.  Extensive infiltrate throughout most of the right lung appears unchanged. Left lower lobe atelectasis unchanged.  Small right pleural effusion  COPD  IMPRESSION: Extensive infiltrate throughout the right lung is unchanged and most consistent with pneumonia. Small right effusion  Left lower lobe atelectasis unchanged  Endotracheal tube remains in good position.   Electronically Signed   By: Franchot Gallo M.D.   On: 07/15/2015 07:04   Dg Abd 1 View  07/14/2015   CLINICAL DATA:  NG tube placement.  EXAM: ABDOMEN - 1 VIEW  COMPARISON:  Radiograph obtained yesterday.  FINDINGS: Tip and side port of the enteric tube below the diaphragm in the stomach. There is air within normal caliber amorphous bowel loops in the left abdomen, nonspecific. No bowel dilatation. No evidence of free air. Bi-iliac vascular stent in the lower abdomen. Interstitial change in the included lungs.  IMPRESSION: Tip and side port of the enteric tube below the diaphragm in the stomach.   Electronically Signed   By: Jeb Levering M.D.   On: 07/14/2015 21:55   Dg Chest Port 1  View  07/14/2015   CLINICAL DATA:  Post intubation films.  EXAM: PORTABLE CHEST 1 VIEW  COMPARISON:  February 12, 2015  FINDINGS: The heart size and mediastinal contours are within normal limits. Endotracheal tube is identified with distal tip 4.2 cm from carina. Left central venous line is identified with distal tip in the superior vena cava. Airspace opacities noted identified throughout both lungs with increased pulmonary interstitium. There is dense consolidation of left lung base with probable left pleural effusion. The visualized skeletal structures are stable.  IMPRESSION: Endotracheal  tube in good position.  There is no pneumothorax.  Diffuse airspace opacities noted throughout bilateral lungs. Differential diagnosis includes pulmonary edema and bilateral pneumonias   Electronically Signed   By: Abelardo Diesel M.D.   On: 07/14/2015 21:10   Dg Abd Portable 1v  07/15/2015   CLINICAL DATA:  Nausea and vomiting  EXAM: PORTABLE ABDOMEN - 1 VIEW  COMPARISON:  07/14/2015  FINDINGS: Scattered large and small bowel gas is noted. A nasogastric catheter is noted within the stomach. Multiple arterial stents are seen bilaterally. Femoral arterial catheter is noted. No free air is seen. No bony abnormality is noted.  IMPRESSION: No acute abnormality seen.   Electronically Signed   By: Inez Catalina M.D.   On: 07/15/2015 17:08   Dg Abd Portable 2v  07/14/2015   CLINICAL DATA:  Abdominal pain. Blood in stool. Peripheral vascular disease.  EXAM: PORTABLE ABDOMEN - 2 VIEW  COMPARISON:  None.  FINDINGS: No evidence of dilated bowel loops or free air. Endovascular stent seen throughout the iliac arteries bilaterally. Diffuse interstitial infiltrates seen throughout the visualized portions of the lungs suspicious for diffuse pulmonary edema.  IMPRESSION: Unremarkable bowel gas pattern.  Diffuse pulmonary interstitial infiltrates suspicious for edema.   Electronically Signed   By: Earle Gell M.D.   On: 07/14/2015 18:35        --Marda Stalker, MD.  Pager [phone] [facility] Office Number: [phone]  Hilja Pesa, M.D.  Vilinda Boehringer, M.D.  Merton Border, M.D

## 2015-07-16 NOTE — Transfer of Care (Signed)
Immediate Anesthesia Transfer of Care Note  Patient: [name]  Procedure(s) Performed: Procedure(s): AMPUTATION ABOVE KNEE (Left)  Patient Location: PACU and ICU  Anesthesia Type:General  Level of Consciousness: sedated  Airway & Oxygen Therapy: Patient placed on Ventilator (see vital sign flow sheet for setting)  Post-op Assessment: Report given to RN and Post -op Vital signs reviewed and stable  Post vital signs: Reviewed and stable  Last Vitals:  Filed Vitals:   07/16/15 1800  BP: 102/58  Pulse: 83  Temp:   Resp: 32    Complications: No apparent anesthesia complications

## 2015-07-16 NOTE — Evaluation (Signed)
Family  Members have different opnions regarding DNR status.Dr.Gouru discussed code status with 2 daughters earlier and they agreed for DNR,third daughter wants full code,husband wants full code,so for now she will be full code,put in chaplain  Visit to decide on HCPOA.

## 2015-07-16 NOTE — Anesthesia Preprocedure Evaluation (Addendum)
Anesthesia Evaluation  Patient identified by MRN, date of birth, ID band Patient awake    Reviewed: Allergy & Precautions, H&P , NPO status , Patient's Chart, lab work & pertinent test results, reviewed documented beta blocker date and time   Airway Mallampati: Intubated  TM Distance: >3 FB Neck ROM: full    Dental  (+) Teeth Intact   Pulmonary neg pulmonary ROS, pneumonia, unresolved, Current Smoker,    Pulmonary exam normal        Cardiovascular Exercise Tolerance: Poor + Peripheral Vascular Disease  negative cardio ROS Normal cardiovascular exam Rhythm:regular Rate:Normal     Neuro/Psych negative neurological ROS  negative psych ROS   GI/Hepatic negative GI ROS, Neg liver ROS,   Endo/Other  negative endocrine ROS  Renal/GU negative Renal ROS  negative genitourinary   Musculoskeletal   Abdominal   Peds  Hematology negative hematology ROS (+)   Anesthesia Other Findings   Reproductive/Obstetrics negative OB ROS                            Anesthesia Physical Anesthesia Plan  ASA: III and emergent  Anesthesia Plan: General ETT   Post-op Pain Management:    Induction:   Airway Management Planned:   Additional Equipment:   Intra-op Plan:   Post-operative Plan:   Informed Consent: I have reviewed the patients History and Physical, chart, labs and discussed the procedure including the risks, benefits and alternatives for the proposed anesthesia with the patient or authorized representative who has indicated his/her understanding and acceptance.     Plan Discussed with: CRNA  Anesthesia Plan Comments:        Anesthesia Quick Evaluation

## 2015-07-16 NOTE — Progress Notes (Signed)
Addressed PT/INR with Dr. Wyn Quaker. His plan is to give 2 units of plasma and recheck INR.

## 2015-07-16 NOTE — Progress Notes (Signed)
   07/16/15 1100  Clinical Encounter Type  Visited With Patient and family together  Visit Type Spiritual support  Consult/Referral To Chaplain  Spiritual Encounters  Spiritual Needs Emotional  Chaplain rounded in unit and offered a compassionate presence and offered support to patient and family. None identified at this time. Chaplain Nayab Aten A. Hazle Ogburn Ext. [phone]

## 2015-07-16 NOTE — Progress Notes (Signed)
ANTIBIOTIC CONSULT NOTE - INITIAL  Pharmacy Consult for Vancomycin/Zosyn/Levaquin Indication: Sepsis/PNA  Allergies  Allergen Reactions  . Morphine And Related Diarrhea and Nausea And Vomiting    Patient Measurements: Height:  (165.1 cm) Weight: 113 lb 12.1 oz (51.6 kg) IBW/kg (Calculated) : 57   Vital Signs: Temp: 98.3 F (36.8 C) (09/30 1215) Temp Source: Oral (09/30 1215) BP: 121/68 mmHg (09/30 1200) Pulse Rate: 92 (09/30 1215) Intake/Output from previous day: 09/29 0701 - 09/30 0700 In: 6129.2 [I.V.:5259.2; Blood:300; NG/GT:70; IV Piggyback:400] Out: 1410 [Urine:1410] Intake/Output from this shift: Total I/O In: 983.3 [I.V.:401.9; Blood:521.4; NG/GT:60] Out: 60 [Urine:60]  Labs:  Recent Labs  07/14/15 2358 07/15/15 0613 07/15/15 1155 07/16/15 0510  WBC 15.2* 12.5*  --  13.4*  HGB 6.3* 7.6* 9.0* 8.4*  PLT 255 201  --  162  CREATININE 1.23* 1.08*  --  1.05*   Estimated Creatinine Clearance: 40.6 mL/min (by C-G formula based on Cr of 1.05). No results for input(s): VANCOTROUGH, VANCOPEAK, VANCORANDOM, GENTTROUGH, GENTPEAK, GENTRANDOM, TOBRATROUGH, TOBRAPEAK, TOBRARND, AMIKACINPEAK, AMIKACINTROU, AMIKACIN in the last 72 hours.   Microbiology: Recent Results (from the past 720 hour(s))  Culture, blood (routine x 2)     Status: None (Preliminary result)   Collection Time: 07/14/15  6:53 AM  Result Value Ref Range Status   Specimen Description BLOOD RIGHT ANTECUBITAL  Final   Special Requests BOTTLES DRAWN AEROBIC AND ANAEROBIC 5CC  Final   Culture NO GROWTH 1 DAY  Final   Report Status PENDING  Incomplete  Culture, blood (routine x 2)     Status: None (Preliminary result)   Collection Time: 07/14/15  6:53 AM  Result Value Ref Range Status   Specimen Description BLOOD RIGHT ANTECUBITAL  Final   Special Requests BOTTLES DRAWN AEROBIC AND ANAEROBIC 5CC  Final   Culture NO GROWTH 1 DAY  Final   Report Status PENDING  Incomplete  MRSA PCR Screening      Status: None   Collection Time: 07/14/15  8:27 PM  Result Value Ref Range Status   MRSA by PCR NEGATIVE NEGATIVE Final    Comment:        The GeneXpert MRSA Assay (FDA approved for NASAL specimens only), is one component of a comprehensive MRSA colonization surveillance program. It is not intended to diagnose MRSA infection nor to guide or monitor treatment for MRSA infections.     Medical History: Past Medical History  Diagnosis Date  . Peripheral vascular disease     Medications:  Scheduled:  . antiseptic oral rinse  7 mL Mouth Rinse QID  . aspirin  81 mg Oral Daily  . chlorhexidine gluconate  15 mL Mouth Rinse BID  . famotidine  20 mg Oral Daily  . hydrocortisone sod succinate (SOLU-CORTEF) inj  50 mg Intravenous Q6H  . levofloxacin (LEVAQUIN) IV  750 mg Intravenous Q48H  . piperacillin-tazobactam (ZOSYN)  IV  3.375 g Intravenous 3 times per day  . sodium chloride  3 mL Intravenous Q12H  . vancomycin  500 mg Intravenous Q18H   Infusions:  . sodium chloride    . fentaNYL infusion INTRAVENOUS 250 mcg/hr (07/16/15 0831)  . norepinephrine (LEVOPHED) Adult infusion 16 mcg/min (07/15/15 2200)  .  sodium bicarbonate 150 mEq in sterile water 1000 mL infusion 50 mL/hr at 07/16/15 1303   PRN: Place/Maintain arterial line **AND** sodium chloride, fentaNYL, HYDROmorphone (DILAUDID) injection, HYDROmorphone, labetalol, magnesium hydroxide, ondansetron **OR** ondansetron (ZOFRAN) IV, oxyCODONE-acetaminophen  Assessment: 69 y/o F with LLE arterial thrombus,  sepsis, and PNA ordered empiric abx with Levaquin, Zosyn, and vancomycin.   Kinetics:   Ke: 0.034 Vd: 32.4   Goal of Therapy:  Vancomycin trough level 15-20 mcg/ml  Plan:  Continue vancomycin 500 mg iv q 18 hours. Vancomycin trough scheduled with the 5th dose. Will continue Levaquin 750 mg iv q 48 hours and Zosyn 3.375 g EI q 8 hours. Will f/u renal function and culture results.   Luisa Hart D 07/16/2015,1:13 PM

## 2015-07-16 NOTE — Progress Notes (Signed)
[facility]  Daily Progress Note   Subjective  - * No surgery date entered *  Patient is awake and alert.  Not hurting, but on a Fentanyl drip. She is much improved and down on her pressors. Leg remains mottled Myoglobin is extremely high  Objective Filed Vitals:   07/16/15 1140 07/16/15 1145 07/16/15 1200 07/16/15 1215  BP:   121/68   Pulse: 69 106 93 92  Temp: 98.7 F (37.1 C)   98.3 F (36.8 C)  TempSrc: Oral   Oral  Resp: 32 24 32 21  Height:      Weight:      SpO2: 100% 100% 100% 100%    Intake/Output Summary (Last 24 hours) at 07/16/15 1251 Last data filed at 07/16/15 1007  Gross per 24 hour  Intake 4931.22 ml  Output   1145 ml  Net 3786.22 ml    PULM  Coarse BS present CV  RRR VASC  Leg remains mottled up in the thigh and cold  Laboratory CBC    Component Value Date/Time   WBC 13.4* 07/16/2015 0510   WBC 10.4 02/12/2015 1641   HGB 8.4* 07/16/2015 0510   HGB 8.6* 02/12/2015 1641   HCT 24.9* 07/16/2015 0510   HCT 25.9* 02/12/2015 1641   PLT 162 07/16/2015 0510   PLT 343 02/12/2015 1641    BMET    Component Value Date/Time   NA 138 07/16/2015 0510   NA 131* 02/13/2015 0426   K 3.2* 07/16/2015 0510   K 3.0* 02/13/2015 0426   CL 101 07/16/2015 0510   CL 103 02/13/2015 0426   CO2 27 07/16/2015 0510   CO2 23 02/13/2015 0426   GLUCOSE 122* 07/16/2015 0510   GLUCOSE 104* 02/13/2015 0426   BUN 27* 07/16/2015 0510   BUN 10 02/13/2015 0426   CREATININE 1.05* 07/16/2015 0510   CREATININE 0.83 02/13/2015 0426   CALCIUM 6.9* 07/16/2015 0510   CALCIUM 7.3* 02/13/2015 0426   GFRNONAA 53* 07/16/2015 0510   GFRNONAA >60 02/13/2015 0426   GFRNONAA 51* 11/08/2014 0618   GFRAA >60 07/16/2015 0510   GFRAA >60 02/13/2015 0426   GFRAA >60 11/08/2014 0618    Assessment/Planning:    Ischemia LLE.  Profound.  Skin is mottled up to the thigh.  She seems to be stabilizing otherwise, and if felt safe by primary service and pulmonary  service, would consider an angiogram today.  Renal function is better and stable.  May need some degree of revascularization to even heal an AKA I am afraid.  Limb loss is expected at this point.  Sepsis. Improving. Less pressors. Vent support down  INR >6.  Will give FFP and then assess for angiogram this afternoon      [name]  07/16/2015, 12:51 PM

## 2015-07-16 NOTE — Progress Notes (Signed)
Nutrition Follow-up     INTERVENTION:   EN: discussed nutritional poc with MD Ram, possible angiogram today, per MD order may start TF today. If pt has procedure, TF to start postop. Recommend starting Vital 1.2 TF at rate of 20 ml/hr with goal rate of 47 ml/hr providing 1354 kcals, 85 g of protein, 914 mL of free water.  Continue to assess   NUTRITION DIAGNOSIS:   Inadequate oral intake related to acute illness as evidenced by NPO status. Being addressed via TF   GOAL:   Provide needs based on ASPEN/SCCM guidelines   MONITOR:    (Energy Intake, Anthropometrics, Digestive System, Electrolyte/Renal Profile)  REASON FOR ASSESSMENT:   Ventilator    ASSESSMENT:    Pt remains on vent, possible angiogram today  Diet Order:  Diet NPO time specified  Digestive System: abdomen soft, BS hypoactive, OG in place  Skin:   (stage II pressure ulcer on heel)  Last BM:  9/28   Protein Profile:  Recent Labs Lab 07/14/15 0410  ALBUMIN 3.6   Electrolyte and Renal Profile:  Recent Labs Lab 07/14/15 2358 07/15/15 0613 07/16/15 0510  BUN 52* 49* 27*  CREATININE 1.23* 1.08* 1.05*  NA 144 144 138  K 4.4 4.1 3.2*  MG 2.2  --   --   PHOS 5.1*  --   --    Glucose Profile:  Recent Labs  07/14/15 1803  GLUCAP 107*   Nutritional Anemia Profile:  CBC Latest Ref Rng 07/16/2015 07/15/2015 07/15/2015  WBC 3.6 - 11.0 K/uL 13.4(H) - 12.5(H)  Hemoglobin 12.0 - 16.0 g/dL 1.6(X) 0.9(U) 7.6(L)  Hematocrit 35.0 - 47.0 % 24.9(L) 27.7(L) 23.2(L)  Platelets 150 - 440 K/uL 162 - 201   Meds: reviewed  Height:   Ht Readings from Last 1 Encounters:  07/14/15  (1.651 m)    Weight:   Wt Readings from Last 1 Encounters:  07/16/15 113 lb 12.1 oz (51.6 kg)    Filed Weights   07/14/15 0357 07/14/15 0820 07/16/15 0600  Weight: 110 lb (49.896 kg) 102 lb 1.6 oz (46.312 kg) 113 lb 12.1 oz (51.6 kg)    BMI:  Body mass index is 18.93 kg/(m^2).  Estimated Nutritional Needs:    Kcal:  1365 kcals (Ve: 13, Tmax: 37.3) using wt of 46.3 kg  Protein:  69-92 g (1.5-2.0 g/kg)   Fluid:  1380-1610 mL (25-30 ml/kg)   HIGH Care Level  Romelle Starcher MS, RD, LDN [phone] Pager

## 2015-07-16 NOTE — Progress Notes (Signed)
   07/16/15 1556  Clinical Encounter Type  Visited With Patient and family together  Visit Type Follow-up;Spiritual support  Referral From Nurse  Consult/Referral To Chaplain  Spiritual Encounters  Spiritual Needs Emotional  Chaplain rounded in the unit and offered a compassionate presence to the additional family and the patient. Patient expressed thanks for checking in with her and the offer of support. Chaplain Shirlean Berman A. Allisha Harter Ext. [phone]

## 2015-07-16 NOTE — Progress Notes (Signed)
Subjective:  Patient is doing fair Able to follow commands  S Cr remains good Family at bedside   Objective:  Vital signs in last 24 hours:  Temp:  [98.7 F (37.1 C)-99.2 F (37.3 C)] 98.8 F (37.1 C) (09/30 0800) Pulse Rate:  [74-130] 87 (09/30 0900) Resp:  [16-32] 29 (09/30 0900) BP: (79-125)/(39-79) 111/71 mmHg (09/30 0900) SpO2:  [99 %-100 %] 100 % (09/30 0900) Arterial Line BP: (104-162)/(49-77) 128/58 mmHg (09/30 0900) FiO2 (%):  [30 %] 30 % (09/30 0900) Weight:  [51.6 kg (113 lb 12.1 oz)] 51.6 kg (113 lb 12.1 oz) (09/30 0600)  Weight change: 5.288 kg (11 lb 10.5 oz) Filed Weights   07/14/15 0357 07/14/15 0820 07/16/15 0600  Weight: 49.896 kg (110 lb) 46.312 kg (102 lb 1.6 oz) 51.6 kg (113 lb 12.1 oz)    Intake/Output:    Intake/Output Summary (Last 24 hours) at 07/16/15 4098 Last data filed at 07/16/15 0900  Gross per 24 hour  Intake 5746.78 ml  Output   1295 ml  Net 4451.78 ml     Physical Exam: General: Frail, elderly laying in bed  HEENT Anicteric, ETT in place  Neck supple  Pulm/lungs Coarse bl  CVS/Heart No rub  Abdomen:  Soft, non tender  Extremities: Left leg cold, rt AKA  Neurologic: Able to follow commands  Skin: Cold left extremity with mottling  Access:        Basic Metabolic Panel:   Recent Labs Lab 07/14/15 0410 07/14/15 2358 07/15/15 0613 07/16/15 0510  NA 135 144 144 138  K 3.4* 4.4 4.1 3.2*  CL 99* 119* 111 101  CO2 14* 19* 25 27  GLUCOSE 195* 124* 118* 122*  BUN 71* 52* 49* 27*  CREATININE 2.18* 1.23* 1.08* 1.05*  CALCIUM 8.6* 7.0* 6.9* 6.9*  MG  --  2.2  --   --   PHOS  --  5.1*  --   --      CBC:  Recent Labs Lab 07/14/15 0410 07/14/15 2358 07/15/15 0613 07/15/15 1155 07/16/15 0510  WBC 32.0* 15.2* 12.5*  --  13.4*  NEUTROABS  --  13.2*  --   --   --   HGB 9.7* 6.3* 7.6* 9.0* 8.4*  HCT 30.8* 20.0* 23.2* 27.7* 24.9*  MCV 89.4 88.6 89.6  --  85.9  PLT 383 255 201  --  162       Microbiology:  Recent Results (from the past 720 hour(s))  Culture, blood (routine x 2)     Status: None (Preliminary result)   Collection Time: 07/14/15  6:53 AM  Result Value Ref Range Status   Specimen Description BLOOD RIGHT ANTECUBITAL  Final   Special Requests BOTTLES DRAWN AEROBIC AND ANAEROBIC 5CC  Final   Culture NO GROWTH 1 DAY  Final   Report Status PENDING  Incomplete  Culture, blood (routine x 2)     Status: None (Preliminary result)   Collection Time: 07/14/15  6:53 AM  Result Value Ref Range Status   Specimen Description BLOOD RIGHT ANTECUBITAL  Final   Special Requests BOTTLES DRAWN AEROBIC AND ANAEROBIC 5CC  Final   Culture NO GROWTH 1 DAY  Final   Report Status PENDING  Incomplete  MRSA PCR Screening     Status: None   Collection Time: 07/14/15  8:27 PM  Result Value Ref Range Status   MRSA by PCR NEGATIVE NEGATIVE Final    Comment:        The GeneXpert MRSA  Assay (FDA approved for NASAL specimens only), is one component of a comprehensive MRSA colonization surveillance program. It is not intended to diagnose MRSA infection nor to guide or monitor treatment for MRSA infections.     Coagulation Studies:  Recent Labs  07/14/15 0410 07/16/15 0510  LABPROT 34.2* 58.2*  INR 3.38 6.75*    Urinalysis: No results for input(s): COLORURINE, LABSPEC, PHURINE, GLUCOSEU, HGBUR, BILIRUBINUR, KETONESUR, PROTEINUR, UROBILINOGEN, NITRITE, LEUKOCYTESUR in the last 72 hours.  Invalid input(s): APPERANCEUR    Imaging: Dg Chest 1 View  07/15/2015   CLINICAL DATA:  Dyspnea.  Ventilator.  EXAM: CHEST 1 VIEW  COMPARISON:  07/14/2015  FINDINGS: Endotracheal tube in good position. Left jugular central venous catheter tip in the SVC unchanged. NG tube in the stomach.  Extensive infiltrate throughout most of the right lung appears unchanged. Left lower lobe atelectasis unchanged.  Small right pleural effusion  COPD  IMPRESSION: Extensive infiltrate throughout the  right lung is unchanged and most consistent with pneumonia. Small right effusion  Left lower lobe atelectasis unchanged  Endotracheal tube remains in good position.   Electronically Signed   By: Marlan Palau M.D.   On: 07/15/2015 07:04   Dg Abd 1 View  07/14/2015   CLINICAL DATA:  NG tube placement.  EXAM: ABDOMEN - 1 VIEW  COMPARISON:  Radiograph obtained yesterday.  FINDINGS: Tip and side port of the enteric tube below the diaphragm in the stomach. There is air within normal caliber amorphous bowel loops in the left abdomen, nonspecific. No bowel dilatation. No evidence of free air. Bi-iliac vascular stent in the lower abdomen. Interstitial change in the included lungs.  IMPRESSION: Tip and side port of the enteric tube below the diaphragm in the stomach.   Electronically Signed   By: Rubye Oaks M.D.   On: 07/14/2015 21:55   Dg Chest Port 1 View  07/14/2015   CLINICAL DATA:  Post intubation films.  EXAM: PORTABLE CHEST 1 VIEW  COMPARISON:  February 12, 2015  FINDINGS: The heart size and mediastinal contours are within normal limits. Endotracheal tube is identified with distal tip 4.2 cm from carina. Left central venous line is identified with distal tip in the superior vena cava. Airspace opacities noted identified throughout both lungs with increased pulmonary interstitium. There is dense consolidation of left lung base with probable left pleural effusion. The visualized skeletal structures are stable.  IMPRESSION: Endotracheal tube in good position.  There is no pneumothorax.  Diffuse airspace opacities noted throughout bilateral lungs. Differential diagnosis includes pulmonary edema and bilateral pneumonias   Electronically Signed   By: Sherian Rein M.D.   On: 07/14/2015 21:10   Dg Abd Portable 1v  07/15/2015   CLINICAL DATA:  Nausea and vomiting  EXAM: PORTABLE ABDOMEN - 1 VIEW  COMPARISON:  07/14/2015  FINDINGS: Scattered large and small bowel gas is noted. A nasogastric catheter is noted  within the stomach. Multiple arterial stents are seen bilaterally. Femoral arterial catheter is noted. No free air is seen. No bony abnormality is noted.  IMPRESSION: No acute abnormality seen.   Electronically Signed   By: Alcide Clever M.D.   On: 07/15/2015 17:08   Dg Abd Portable 2v  07/14/2015   CLINICAL DATA:  Abdominal pain. Blood in stool. Peripheral vascular disease.  EXAM: PORTABLE ABDOMEN - 2 VIEW  COMPARISON:  None.  FINDINGS: No evidence of dilated bowel loops or free air. Endovascular stent seen throughout the iliac arteries bilaterally. Diffuse interstitial infiltrates seen throughout  the visualized portions of the lungs suspicious for diffuse pulmonary edema.  IMPRESSION: Unremarkable bowel gas pattern.  Diffuse pulmonary interstitial infiltrates suspicious for edema.   Electronically Signed   By: Myles Rosenthal M.D.   On: 07/14/2015 18:35     Medications:   . sodium chloride    . fentaNYL infusion INTRAVENOUS 250 mcg/hr (07/16/15 0831)  . norepinephrine (LEVOPHED) Adult infusion 16 mcg/min (07/15/15 2200)  .  sodium bicarbonate 150 mEq in sterile water 1000 mL infusion 150 mL/hr at 07/16/15 0907   . antiseptic oral rinse  7 mL Mouth Rinse QID  . aspirin  81 mg Oral Daily  . chlorhexidine gluconate  15 mL Mouth Rinse BID  . famotidine  20 mg Oral Daily  . hydrocortisone sod succinate (SOLU-CORTEF) inj  50 mg Intravenous Q6H  . levofloxacin (LEVAQUIN) IV  750 mg Intravenous Q48H  . piperacillin-tazobactam (ZOSYN)  IV  3.375 g Intravenous 3 times per day  . sodium chloride  3 mL Intravenous Q12H  . vancomycin  500 mg Intravenous Q18H   Place/Maintain arterial line **AND** sodium chloride, fentaNYL, HYDROmorphone (DILAUDID) injection, HYDROmorphone, labetalol, magnesium hydroxide, ondansetron **OR** ondansetron (ZOFRAN) IV, oxyCODONE-acetaminophen  Assessment/ Plan:  69 y.o. female with a PMHX of severe peripheral vascular disease, was admitted on 07/14/2015 with limb threatening  ischemia.  1. Acute renal failure, likely ATN and volume depletion. Serum creatinine is improving Supportive care at present Reduce iv bicarb rate to 50 cc/hr 2. acute respiratory failure. Right lung pneumonia noted on chest x-ray  3. Acute on chronic left lower extremity ischemia. Vascular surgery is planning left leg amputation   LOS: 2 [name] 9/30/20169:23 AM

## 2015-07-16 NOTE — Care Management Note (Signed)
Case Management Note  Patient Details  Name: MAELA TAKEDA MRN: [id number] Date of Birth: 01/12/1946  Subjective/Objective:   Admitted with acute left lower extremity. Transferred to ICU due to acute respiratory failure and intubated. Vascular plan lower extremity amputation. Patient from home with spouse. Will probably need SNF at discharge secondary to amputation.                   Action/Plan: Will follow progression.   Expected Discharge Date:                  Expected Discharge Plan:  Skilled Nursing Facility  In-House Referral:     Discharge planning Services  CM Consult  Post Acute Care Choice:    Choice offered to:     DME Arranged:    DME Agency:     HH Arranged:    HH Agency:     Status of Service:  In process, will continue to follow  Medicare Important Message Given:    Date Medicare IM Given:    Medicare IM give by:    Date Additional Medicare IM Given:    Additional Medicare Important Message give by:     If discussed at Long Length of Stay Meetings, dates discussed:    Additional Comments:  Marily Memos, RN 07/16/2015, 11:48 AM

## 2015-07-16 NOTE — Care Management Important Message (Signed)
Important Message  Patient Details  Name: [name] MRN: [id number] Date of Birth: [birth year]   Medicare Important Message Given:  Yes-second notification given    Olegario Messier A Allmond 07/16/2015, 1:31 PM

## 2015-07-16 NOTE — Consult Note (Addendum)
[facility]ious Disease     Reason for Consult:Gangrene, sepsis  Referring Physician: Gouru Date of Admission:  07/14/2015   Active Problems:   Arterial occlusion   Pressure ulcer   Acute respiratory failure   HPI: [name] is a 69 y.o. female with severe PVD s/p prior AKA admitted with L leg pain of acute onset and found to have an ischemic leg. She has had markedly elevated CK and myoglobulin.  WBC was 30k. Is on pressors. Covered with broad spectrum abx.   Seen at bedside with husband. Intubated  Past Medical History  Diagnosis Date  . Peripheral vascular disease    Past Surgical History  Procedure Laterality Date  . Below knee leg amputation    . Stents left leg     Social History  Substance Use Topics  . Smoking status: Current Some Day Smoker  . Smokeless tobacco: None  . Alcohol Use: No   No family history on file.  Allergies:  Allergies  Allergen Reactions  . Morphine And Related Diarrhea and Nausea And Vomiting    Current antibiotics: Antibiotics Given (last 72 hours)    Date/Time Action Medication Dose Rate   07/14/15 1026 Given  [medication not available at scheduled time]   piperacillin-tazobactam (ZOSYN) IVPB 3.375 g 3.375 g 12.5 mL/hr   07/14/15 2208 Given   piperacillin-tazobactam (ZOSYN) IVPB 3.375 g 3.375 g 12.5 mL/hr   07/15/15 0552 Given   piperacillin-tazobactam (ZOSYN) IVPB 3.375 g 3.375 g 12.5 mL/hr   07/15/15 1043 Given   levofloxacin (LEVAQUIN) IVPB 750 mg 750 mg 100 mL/hr   07/15/15 1137 Given   vancomycin (VANCOCIN) IVPB 750 mg/150 ml premix 750 mg 150 mL/hr   07/15/15 1503 Given   piperacillin-tazobactam (ZOSYN) IVPB 3.375 g 3.375 g 12.5 mL/hr   07/15/15 2327 Given   vancomycin (VANCOCIN) 500 mg in sodium chloride 0.9 % 100 mL IVPB 500 mg 100 mL/hr   07/15/15 2332 Given   piperacillin-tazobactam (ZOSYN) IVPB 3.375 g 3.375 g 12.5 mL/hr   07/16/15 0509 Given   piperacillin-tazobactam (ZOSYN) IVPB 3.375 g 3.375 g  12.5 mL/hr      MEDICATIONS: . antiseptic oral rinse  7 mL Mouth Rinse QID  . aspirin  81 mg Oral Daily  . chlorhexidine gluconate  15 mL Mouth Rinse BID  . famotidine  20 mg Oral Daily  . hydrocortisone sod succinate (SOLU-CORTEF) inj  50 mg Intravenous Q6H  . levofloxacin (LEVAQUIN) IV  750 mg Intravenous Q48H  . piperacillin-tazobactam (ZOSYN)  IV  3.375 g Intravenous 3 times per day  . potassium chloride  10 mEq Intravenous Q1 Hr x 4  . sodium chloride  3 mL Intravenous Q12H  . vancomycin  500 mg Intravenous Q18H    Review of Systems - unable to obtain   OBJECTIVE: Temp:  [98.3 F (36.8 C)-99.3 F (37.4 C)] 99.3 F (37.4 C) (09/30 1400) Pulse Rate:  [69-130] 97 (09/30 1400) Resp:  [15-32] 32 (09/30 1400) BP: (79-125)/(39-79) 106/66 mmHg (09/30 1400) SpO2:  [99 %-100 %] 100 % (09/30 1400) Arterial Line BP: (104-162)/(49-77) 123/56 mmHg (09/30 1400) FiO2 (%):  [30 %] 30 % (09/30 1200) Weight:  [51.6 kg (113 lb 12.1 oz)] 51.6 kg (113 lb 12.1 oz) (09/30 0600) Physical Exam  Constitutional: frail, intubated, sedated HENT: [place]/AT, ETT in place Cardiovascular: Normal rate, regular rhythm and normal heart sounds Pulmonary/Chest: Effort normal and breath sounds normal. No respiratory distress.  has no wheezes.  Abdominal: Soft. Bowel sounds  are normal.  exhibits no distension. There is no tenderness.  Lymphadenopathy: no cervical adenopathy. No axillary adenopathy Neurological: intubated and sedated Skin: LLE with mottleing above knee, very cool,    LABS: Results for orders placed or performed during the hospital encounter of 07/14/15 (from the past 48 hour(s))  Heparin level (unfractionated)     Status: None   Collection Time: 07/14/15  4:01 PM  Result Value Ref Range   Heparin Unfractionated 0.52 0.30 - 0.70 IU/mL    Comment:        IF HEPARIN RESULTS ARE BELOW EXPECTED VALUES, AND PATIENT DOSAGE HAS BEEN CONFIRMED, SUGGEST FOLLOW UP TESTING OF ANTITHROMBIN III  LEVELS.   Glucose, capillary     Status: Abnormal   Collection Time: 07/14/15  6:03 PM  Result Value Ref Range   Glucose-Capillary 107 (H) 65 - 99 mg/dL  Blood gas, arterial     Status: Abnormal   Collection Time: 07/14/15  6:16 PM  Result Value Ref Range   FIO2 44.00    pH, Arterial 7.30 (L) 7.350 - 7.450   pCO2 arterial 21 (L) 32.0 - 48.0 mmHg   pO2, Arterial 62 (L) 83.0 - 108.0 mmHg   Bicarbonate 10.3 (L) 21.0 - 28.0 mEq/L   Acid-base deficit 13.9 (H) 0.0 - 2.0 mmol/L   O2 Saturation 88.6 %   Patient temperature 37.0    Collection site RIGHT RADIAL    Sample type ARTERIAL DRAW    Allens test (pass/fail) POSITIVE (A) PASS  MRSA PCR Screening     Status: None   Collection Time: 07/14/15  8:27 PM  Result Value Ref Range   MRSA by PCR NEGATIVE NEGATIVE    Comment:        The GeneXpert MRSA Assay (FDA approved for NASAL specimens only), is one component of a comprehensive MRSA colonization surveillance program. It is not intended to diagnose MRSA infection nor to guide or monitor treatment for MRSA infections.   CK     Status: Abnormal   Collection Time: 07/14/15  8:33 PM  Result Value Ref Range   Total CK 349 (H) 38 - 234 U/L  Lactic acid, plasma     Status: Abnormal   Collection Time: 07/14/15  8:44 PM  Result Value Ref Range   Lactic Acid, Venous 6.4 (HH) 0.5 - 2.0 mmol/L    Comment: CRITICAL RESULT CALLED TO, READ BACK BY AND VERIFIED WITH BRITTNAY RUDD 07/14/2015 2131 [place]  Blood gas, arterial     Status: Abnormal   Collection Time: 07/14/15  8:45 PM  Result Value Ref Range   FIO2 1.00    Delivery systems VENTILATOR    Mode PRESSURE REGULATED VOLUME CONTROL    VT 400 mL   LHR 20 resp/min   Peep/cpap 5.0 cm H20   pH, Arterial 6.98 (LL) 7.350 - 7.450    Comment: CRITICAL RESULT CALLED TO, READ BACK BY AND VERIFIED WITH: CRITICAL VALUE CALLED TO DR.SUMMER AT 2055 ON 07/14/15    pCO2 arterial 41 32.0 - 48.0 mmHg   pO2, Arterial 128 (H) 83.0 - 108.0 mmHg    Bicarbonate 9.7 (L) 21.0 - 28.0 mEq/L   Acid-base deficit 21.5 (H) 0.0 - 2.0 mmol/L   O2 Saturation 96.6 %   Patient temperature 37.0    Collection site A-LINE    Sample type ARTERIAL DRAW    Mechanical Rate 20   Blood gas, arterial     Status: Abnormal   Collection Time: 07/14/15 10:30 PM  Result Value Ref Range   FIO2 1.00    Delivery systems VENTILATOR    Mode PRESSURE REGULATED VOLUME CONTROL    VT 400 mL   Peep/cpap 5.0 cm H20   pH, Arterial 7.22 (L) 7.350 - 7.450   pCO2 arterial 38 32.0 - 48.0 mmHg   pO2, Arterial 133 (H) 83.0 - 108.0 mmHg   Bicarbonate 15.6 (L) 21.0 - 28.0 mEq/L   Acid-base deficit 11.4 (H) 0.0 - 2.0 mmol/L   O2 Saturation 98.4 %   Patient temperature 37.0    Collection site A-LINE    Sample type ARTERIAL DRAW    Mechanical Rate 32   Heparin level (unfractionated)     Status: Abnormal   Collection Time: 07/14/15 11:58 PM  Result Value Ref Range   Heparin Unfractionated 1.13 (H) 0.30 - 0.70 IU/mL    Comment:        IF HEPARIN RESULTS ARE BELOW EXPECTED VALUES, AND PATIENT DOSAGE HAS BEEN CONFIRMED, SUGGEST FOLLOW UP TESTING OF ANTITHROMBIN III LEVELS.   CBC with Differential/Platelet     Status: Abnormal   Collection Time: 07/14/15 11:58 PM  Result Value Ref Range   WBC 15.2 (H) 3.6 - 11.0 K/uL   RBC 2.25 (L) 3.80 - 5.20 MIL/uL   Hemoglobin 6.3 (L) 12.0 - 16.0 g/dL    Comment: ARTERIAL DRAW   HCT 20.0 (L) 35.0 - 47.0 %   MCV 88.6 80.0 - 100.0 fL   MCH 27.8 26.0 - 34.0 pg   MCHC 31.4 (L) 32.0 - 36.0 g/dL   RDW 16.7 (H) 11.5 - 14.5 %   Platelets 255 150 - 440 K/uL   Neutrophils Relative % 87% %   Neutro Abs 13.2 (H) 1.4 - 6.5 K/uL   Lymphocytes Relative 7% %   Lymphs Abs 1.1 1.0 - 3.6 K/uL   Monocytes Relative 6% %   Monocytes Absolute 0.9 0.2 - 0.9 K/uL   Eosinophils Relative 0% %   Eosinophils Absolute 0.0 0 - 0.7 K/uL   Basophils Relative 0% %   Basophils Absolute 0.0 0 - 0.1 K/uL  Basic metabolic panel     Status: Abnormal    Collection Time: 07/14/15 11:58 PM  Result Value Ref Range   Sodium 144 135 - 145 mmol/L    Comment: RESULTS VERIFIED BY REPEAT TESTING   Potassium 4.4 3.5 - 5.1 mmol/L   Chloride 119 (H) 101 - 111 mmol/L   CO2 19 (L) 22 - 32 mmol/L   Glucose, Bld 124 (H) 65 - 99 mg/dL   BUN 52 (H) 6 - 20 mg/dL   Creatinine, Ser 1.23 (H) 0.44 - 1.00 mg/dL   Calcium 7.0 (L) 8.9 - 10.3 mg/dL   GFR calc non Af Amer 43 (L) >60 mL/min   GFR calc Af Amer 50 (L) >60 mL/min    Comment: (NOTE) The eGFR has been calculated using the CKD EPI equation. This calculation has not been validated in all clinical situations. eGFR's persistently <60 mL/min signify possible Chronic Kidney Disease.    Anion gap 6 5 - 15  Phosphorus     Status: Abnormal   Collection Time: 07/14/15 11:58 PM  Result Value Ref Range   Phosphorus 5.1 (H) 2.5 - 4.6 mg/dL  Magnesium     Status: None   Collection Time: 07/14/15 11:58 PM  Result Value Ref Range   Magnesium 2.2 1.7 - 2.4 mg/dL  Type and screen     Status: None   Collection Time: 07/15/15  2:00 AM  Result Value Ref Range   ABO/RH(D) O POS    Antibody Screen NEG    Sample Expiration 07/18/2015    Unit Number [id number]    Blood Component Type RED CELLS,LR    Unit division 00    Status of Unit ISSUED,FINAL    Transfusion Status OK TO TRANSFUSE    Crossmatch Result Compatible    Unit Number [id number]    Blood Component Type RBC LR PHER2    Unit division 00    Status of Unit ISSUED,FINAL    Transfusion Status OK TO TRANSFUSE    Crossmatch Result Compatible   ABO/Rh     Status: None   Collection Time: 07/15/15  2:00 AM  Result Value Ref Range   ABO/RH(D) O POS   Prepare RBC     Status: None   Collection Time: 07/15/15  2:00 AM  Result Value Ref Range   Order Confirmation ORDER PROCESSED BY BLOOD BANK   Blood gas, arterial     Status: Abnormal   Collection Time: 07/15/15  5:08 AM  Result Value Ref Range   FIO2 1.00    Delivery systems VENTILATOR    Mode  PRESSURE REGULATED VOLUME CONTROL    VT 400 mL   LHR 32 resp/min   Peep/cpap 5.0 cm H20   pH, Arterial 7.49 (H) 7.350 - 7.450   pCO2 arterial 29 (L) 32.0 - 48.0 mmHg   pO2, Arterial 465 (H) 83.0 - 108.0 mmHg   Bicarbonate 22.1 21.0 - 28.0 mEq/L   Acid-base deficit 0.2 0.0 - 2.0 mmol/L   O2 Saturation 100.0 %   Patient temperature 37.0    Collection site A-LINE    Sample type ARTERIAL DRAW    Mechanical Rate 32   CBC     Status: Abnormal   Collection Time: 07/15/15  6:13 AM  Result Value Ref Range   WBC 12.5 (H) 3.6 - 11.0 K/uL   RBC 2.59 (L) 3.80 - 5.20 MIL/uL   Hemoglobin 7.6 (L) 12.0 - 16.0 g/dL   HCT 23.2 (L) 35.0 - 47.0 %   MCV 89.6 80.0 - 100.0 fL   MCH 29.4 26.0 - 34.0 pg   MCHC 32.8 32.0 - 36.0 g/dL   RDW 16.9 (H) 11.5 - 14.5 %   Platelets 201 150 - 440 K/uL  Basic metabolic panel     Status: Abnormal   Collection Time: 07/15/15  6:13 AM  Result Value Ref Range   Sodium 144 135 - 145 mmol/L    Comment: RESULTS CHECKED   Potassium 4.1 3.5 - 5.1 mmol/L   Chloride 111 101 - 111 mmol/L   CO2 25 22 - 32 mmol/L   Glucose, Bld 118 (H) 65 - 99 mg/dL   BUN 49 (H) 6 - 20 mg/dL   Creatinine, Ser 1.08 (H) 0.44 - 1.00 mg/dL    Comment: RESULTS CHECKED   Calcium 6.9 (L) 8.9 - 10.3 mg/dL   GFR calc non Af Amer 51 (L) >60 mL/min   GFR calc Af Amer 59 (L) >60 mL/min    Comment: (NOTE) The eGFR has been calculated using the CKD EPI equation. This calculation has not been validated in all clinical situations. eGFR's persistently <60 mL/min signify possible Chronic Kidney Disease.    Anion gap 8 5 - 15  CK     Status: Abnormal   Collection Time: 07/15/15 11:54 AM  Result Value Ref Range   [place] 11057 (H) 38 - 234 U/L  Comment: RESULT CONFIRMED BY MANUAL DILUTION  Hemoglobin and hematocrit, blood     Status: Abnormal   Collection Time: 07/15/15 11:55 AM  Result Value Ref Range   Hemoglobin 9.0 (L) 12.0 - 16.0 g/dL   HCT 27.7 (L) 35.0 - 47.0 %  Myoglobin, serum      Status: Abnormal   Collection Time: 07/15/15  3:43 PM  Result Value Ref Range   Myoglobin 25492 (H) 25 - 58 ng/mL    Comment: (NOTE) Performed At: [facility] [place], [place] [id number] Lindon Romp MD JO:[id number]   Protime-INR     Status: Abnormal   Collection Time: 07/16/15  5:10 AM  Result Value Ref Range   Prothrombin Time 58.2 (H) 11.4 - 15.0 seconds    Comment: RESULT REPEATED AND VERIFIED   INR 6.75 (HH)     Comment: RESULT REPEATED AND VERIFIED CRITICAL RESULT CALLED TO, READ BACK BY AND VERIFIED WITH: LESLIE LEWIS AT 0607 ON 07/16/15.Marland KitchenMarland KitchenShreveport Endoscopy Center   CBC     Status: Abnormal   Collection Time: 07/16/15  5:10 AM  Result Value Ref Range   WBC 13.4 (H) 3.6 - 11.0 K/uL   RBC 2.90 (L) 3.80 - 5.20 MIL/uL   Hemoglobin 8.4 (L) 12.0 - 16.0 g/dL   HCT 24.9 (L) 35.0 - 47.0 %   MCV 85.9 80.0 - 100.0 fL   MCH 29.0 26.0 - 34.0 pg   MCHC 33.8 32.0 - 36.0 g/dL   RDW 16.9 (H) 11.5 - 14.5 %   Platelets 162 150 - 440 K/uL  Basic metabolic panel     Status: Abnormal   Collection Time: 07/16/15  5:10 AM  Result Value Ref Range   Sodium 138 135 - 145 mmol/L   Potassium 3.2 (L) 3.5 - 5.1 mmol/L   Chloride 101 101 - 111 mmol/L   CO2 27 22 - 32 mmol/L   Glucose, Bld 122 (H) 65 - 99 mg/dL   BUN 27 (H) 6 - 20 mg/dL   Creatinine, Ser 1.05 (H) 0.44 - 1.00 mg/dL   Calcium 6.9 (L) 8.9 - 10.3 mg/dL   GFR calc non Af Amer 53 (L) >60 mL/min   GFR calc Af Amer >60 >60 mL/min    Comment: (NOTE) The eGFR has been calculated using the CKD EPI equation. This calculation has not been validated in all clinical situations. eGFR's persistently <60 mL/min signify possible Chronic Kidney Disease.    Anion gap 10 5 - 15  Prepare fresh frozen plasma     Status: None (Preliminary result)   Collection Time: 07/16/15 11:05 AM  Result Value Ref Range   Unit Number [id number]    Blood Component Type THAWED PLASMA    Unit division 00    Status of Unit ISSUED    Transfusion  Status OK TO TRANSFUSE    Unit Number [id number]    Blood Component Type THAWED PLASMA    Unit division 00    Status of Unit ISSUED    Transfusion Status OK TO TRANSFUSE    No components found for: ESR, C REACTIVE PROTEIN MICRO: Recent Results (from the past 720 hour(s))  Culture, blood (routine x 2)     Status: None (Preliminary result)   Collection Time: 07/14/15  6:53 AM  Result Value Ref Range Status   Specimen Description BLOOD RIGHT ANTECUBITAL  Final   Special Requests BOTTLES DRAWN AEROBIC AND ANAEROBIC 5CC  Final   Culture NO GROWTH 1 DAY  Final  Report Status PENDING  Incomplete  Culture, blood (routine x 2)     Status: None (Preliminary result)   Collection Time: 07/14/15  6:53 AM  Result Value Ref Range Status   Specimen Description BLOOD RIGHT ANTECUBITAL  Final   Special Requests BOTTLES DRAWN AEROBIC AND ANAEROBIC 5CC  Final   Culture NO GROWTH 1 DAY  Final   Report Status PENDING  Incomplete  MRSA PCR Screening     Status: None   Collection Time: 07/14/15  8:27 PM  Result Value Ref Range Status   MRSA by PCR NEGATIVE NEGATIVE Final    Comment:        The GeneXpert MRSA Assay (FDA approved for NASAL specimens only), is one component of a comprehensive MRSA colonization surveillance program. It is not intended to diagnose MRSA infection nor to guide or monitor treatment for MRSA infections.     IMAGING: Dg Chest 1 View  07/15/2015   CLINICAL DATA:  Dyspnea.  Ventilator.  EXAM: CHEST 1 VIEW  COMPARISON:  07/14/2015  FINDINGS: Endotracheal tube in good position. Left jugular central venous catheter tip in the SVC unchanged. NG tube in the stomach.  Extensive infiltrate throughout most of the right lung appears unchanged. Left lower lobe atelectasis unchanged.  Small right pleural effusion  COPD  IMPRESSION: Extensive infiltrate throughout the right lung is unchanged and most consistent with pneumonia. Small right effusion  Left lower lobe atelectasis  unchanged  Endotracheal tube remains in good position.   Electronically Signed   By: Franchot Gallo M.D.   On: 07/15/2015 07:04   Dg Abd 1 View  07/14/2015   CLINICAL DATA:  NG tube placement.  EXAM: ABDOMEN - 1 VIEW  COMPARISON:  Radiograph obtained yesterday.  FINDINGS: Tip and side port of the enteric tube below the diaphragm in the stomach. There is air within normal caliber amorphous bowel loops in the left abdomen, nonspecific. No bowel dilatation. No evidence of free air. Bi-iliac vascular stent in the lower abdomen. Interstitial change in the included lungs.  IMPRESSION: Tip and side port of the enteric tube below the diaphragm in the stomach.   Electronically Signed   By: Jeb Levering M.D.   On: 07/14/2015 21:55   Dg Chest Port 1 View  07/14/2015   CLINICAL DATA:  Post intubation films.  EXAM: PORTABLE CHEST 1 VIEW  COMPARISON:  February 12, 2015  FINDINGS: The heart size and mediastinal contours are within normal limits. Endotracheal tube is identified with distal tip 4.2 cm from carina. Left central venous line is identified with distal tip in the superior vena cava. Airspace opacities noted identified throughout both lungs with increased pulmonary interstitium. There is dense consolidation of left lung base with probable left pleural effusion. The visualized skeletal structures are stable.  IMPRESSION: Endotracheal tube in good position.  There is no pneumothorax.  Diffuse airspace opacities noted throughout bilateral lungs. Differential diagnosis includes pulmonary edema and bilateral pneumonias   Electronically Signed   By: Abelardo Diesel M.D.   On: 07/14/2015 21:10   Dg Abd Portable 1v  07/15/2015   CLINICAL DATA:  Nausea and vomiting  EXAM: PORTABLE ABDOMEN - 1 VIEW  COMPARISON:  07/14/2015  FINDINGS: Scattered large and small bowel gas is noted. A nasogastric catheter is noted within the stomach. Multiple arterial stents are seen bilaterally. Femoral arterial catheter is noted. No free air  is seen. No bony abnormality is noted.  IMPRESSION: No acute abnormality seen.   Electronically Signed  By: Inez Catalina M.D.   On: 07/15/2015 17:08   Dg Abd Portable 2v  07/14/2015   CLINICAL DATA:  Abdominal pain. Blood in stool. Peripheral vascular disease.  EXAM: PORTABLE ABDOMEN - 2 VIEW  COMPARISON:  None.  FINDINGS: No evidence of dilated bowel loops or free air. Endovascular stent seen throughout the iliac arteries bilaterally. Diffuse interstitial infiltrates seen throughout the visualized portions of the lungs suspicious for diffuse pulmonary edema.  IMPRESSION: Unremarkable bowel gas pattern.  Diffuse pulmonary interstitial infiltrates suspicious for edema.   Electronically Signed   By: Earle Gell M.D.   On: 07/14/2015 18:35    Assessment:   [name] is a 69 y.o. female with Severe PVD admitted with acute ischemic L leg, now with rhabdomyolysis.  Had wbc 30 k now down, bcx negative. Remains on broad spectrum abx. Being followed by vascular and renal.  Recommendations Cont  zosyn  Would stop vanco and levofloxacin - I have discontinued  Thank you very much for allowing me to participate in the care of this patient. Please call with questions.   Cheral Marker. Ola Spurr, MD

## 2015-07-16 NOTE — Therapy (Signed)
[facility] [address] [place], [place], [address] Phone: [phone]   Fax:  [phone]  Patient Details  Name: [name] MRN: [id number] Date of Birth: October 15, 1946 Referring Provider:  No ref. provider found  Encounter Date: 2015-07-18   PHYSICAL THERAPY DISCHARGE SUMMARY  Visits from Start of Care: 8  Current functional level related to goals / functional outcomes: Patient was hospitalized with possible thrombosis to non-amputated limb. She was progressing towards LTGs with skilled care but early discharge due to medical issue with hospitalization.      PT Long Term Goals - 06/07/15 1300    PT LONG TERM GOAL #1   Title patient tolerates prosthesis wear >90% of awake hours without skin integrity issues or limb discomfort to enable function throughout her day. ( Target Date: 08/06/2015)   Time 2   Period Months   Status New   PT LONG TERM GOAL #2   Title Patient verbalizes / demonstrates proper prosthetic care to enable safe use of prosthesis. ( Target Date: 08/06/2015)   Time 2   Period Months   Status New   PT LONG TERM GOAL #3   Title Patient ambulates household distance of 85' with cane or less with prosthesis carrying light weight object modified independent. ( Target Date: 08/06/2015)   Time 2   Period Months   Status New   PT LONG TERM GOAL #4   Title patient ambulates 400' with LRAD & prosthesis modified independent to enable community access. ( Target Date: 08/06/2015)   Time 2   Period Months   Status New   PT LONG TERM GOAL #5   Title Patient negotiates ramps, curbs & stairs with LRAD & prosthesis modified independent to enable community access. ( Target Date: 08/06/2015)   Time 2   Period Months   Status New   Additional Long Term Goals   Additional Long Term Goals Yes   PT LONG TERM GOAL #6   Title Berg Balance Test >36/56 to reduce fall risk. ( Target Date: 08/06/2015)   Time 2   Status New   PT LONG TERM GOAL #7   Title Patient self reports via FOTO a functional status of >45 ( Target Date: 08/06/2015)   Time 2   Period Months   Status New         G-Codes - 07/18/15 1240    Functional Assessment Tool Used Patient required guidance cues to mange prosthesis use but had general understanding to use safely. Patient tolerated daily wear 2hrs 2x/day with limiting wear due to INR issues and potential bruising.   Functional Limitation Self care   Self Care Goal Status [phone]) At least 1 percent but less than 20 percent impaired, limited or restricted   Self Care Discharge Status [phone]) At least 60 percent but less than 80 percent impaired, limited or restricted       Remaining deficits: Patient was dependent on rolling walker for gait with Transfemoral prosthesis including barriers.   Education / Equipment: Prosthetic care. Plan: Patient agrees to discharge.  Patient goals were not met. Patient is being discharged due to a change in medical status.  ?????       Caio Devera  PT, DPT  07-18-2015, 12:47 PM  [facility] [address] [place], [place], [address] Phone: [phone]   Fax:  [phone]

## 2015-07-16 NOTE — Progress Notes (Signed)
Lab called and notified me to an alert value of 6.75INR.  I paged Dr. Betti Cruz and informed him and also Elink and they both instructed me to wait for morning MD to round.

## 2015-07-16 NOTE — Op Note (Signed)
  [facility]   OPERATIVE NOTE   PROCEDURE: left open above-the-knee amputation  PRE-OPERATIVE DIAGNOSIS: left foot gangrene  POST-OPERATIVE DIAGNOSIS: same as above  SURGEON:  Levora Dredge, MD  ASSISTANT(S): None  ANESTHESIA: general  ESTIMATED BLOOD LOSS: 10 cc  FINDING(S): Ischemic leg  SPECIMEN(S):  left above-the-knee amputation  INDICATIONS:   [name] is a 69 y.o. female who developed left leg gangrene secondary to pneumonia and sepsis with shock.  The patient is scheduled for a left above-the-knee amputation.  I discussed in depth with the patient the risks, benefits, and alternatives to this procedure.  The patient is aware that the risk of this operation included but are not limited to:  bleeding, infection, myocardial infarction, stroke, death, failure to heal amputation wound, and possible need for more proximal amputation.  The patient is aware of the risks and agrees proceed forward with the procedure.  DESCRIPTION: After full informed written consent was obtained from the patient, the patient was taken to the operating room, and placed supine upon the operating table.  Prior to induction, the patient received IV antibiotics.  The patient was then prepped and draped in the standard fashion for an above-the-knee amputation.  After obtaining adequate anesthesia, the patient was prepped and draped in the standard fashion for a above-the-knee amputation.  I marked out the anterior and posterior flaps for a fish-mouth type of amputation.  I made the incisions for these flaps, and then dissected through the subcutaneous tissue, fascia, and muscles circumferentially.  I elevated  the periosteal tissue 3 cm more proximal than the anterior skin flap.  I then transected the femur with a Gigli saw. Superficial femoral artery and vein were ligated with 0 Vicryl. Wound was then irrigated and a dry gauze dressing applied and secured with Ioban.  The  distal left limb was then passed off the field as specimen  COMPLICATIONS: None  CONDITION: Unchanged  Schnier, Latina Craver  07/16/2015, 6:59 PM

## 2015-07-17 ENCOUNTER — Inpatient Hospital Stay: Payer: Medicare Other

## 2015-07-17 LAB — BLOOD GAS, ARTERIAL
Acid-Base Excess: 7.4 mmol/L — ABNORMAL HIGH (ref 0.0–3.0)
Acid-Base Excess: 7 mmol/L — ABNORMAL HIGH (ref 0.0–3.0)
Acid-Base Excess: 6.6 mmol/L — ABNORMAL HIGH (ref 0.0–3.0)
Bicarbonate: 28.5 mEq/L — ABNORMAL HIGH (ref 21.0–28.0)
Bicarbonate: 30.2 mEq/L — ABNORMAL HIGH (ref 21.0–28.0)
Bicarbonate: 31.3 mEq/L — ABNORMAL HIGH (ref 21.0–28.0)
FIO2: 0.45
FIO2: 0.35
FIO2: 0.35
MECHVT: 400 mL
MECHVT: 400 mL
Mode: POSITIVE
O2 Saturation: 99.4 %
O2 Saturation: 98 %
O2 Saturation: 97.6 %
PEEP: 5 cmH2O
PEEP: 5 cmH2O
PEEP: 5 cmH2O
Patient temperature: 37
Patient temperature: 37
Patient temperature: 37
Pressure support: 5 cmH2O
RATE: 32 resp/min
RATE: 18 resp/min
pCO2 arterial: 29 mmHg — ABNORMAL LOW (ref 32.0–48.0)
pCO2 arterial: 37 mmHg (ref 32.0–48.0)
pCO2 arterial: 44 mmHg (ref 32.0–48.0)
pH, Arterial: 7.6 — ABNORMAL HIGH (ref 7.350–7.450)
pH, Arterial: 7.52 — ABNORMAL HIGH (ref 7.350–7.450)
pH, Arterial: 7.46 — ABNORMAL HIGH (ref 7.350–7.450)
pO2, Arterial: 131 mmHg — ABNORMAL HIGH (ref 83.0–108.0)
pO2, Arterial: 94 mmHg (ref 83.0–108.0)
pO2, Arterial: 92 mmHg (ref 83.0–108.0)

## 2015-07-17 LAB — CBC
HCT: 22.5 % — ABNORMAL LOW (ref 35.0–47.0)
Hemoglobin: 7.7 g/dL — ABNORMAL LOW (ref 12.0–16.0)
MCH: 30 pg (ref 26.0–34.0)
MCHC: 34 g/dL (ref 32.0–36.0)
MCV: 88 fL (ref 80.0–100.0)
Platelets: 116 10*3/uL — ABNORMAL LOW (ref 150–440)
RBC: 2.56 MIL/uL — ABNORMAL LOW (ref 3.80–5.20)
RDW: 17.2 % — ABNORMAL HIGH (ref 11.5–14.5)
WBC: 9.4 10*3/uL (ref 3.6–11.0)

## 2015-07-17 LAB — BASIC METABOLIC PANEL
Anion gap: 8 (ref 5–15)
BUN: 22 mg/dL — ABNORMAL HIGH (ref 6–20)
CO2: 31 mmol/L (ref 22–32)
Calcium: 6.9 mg/dL — ABNORMAL LOW (ref 8.9–10.3)
Chloride: 99 mmol/L — ABNORMAL LOW (ref 101–111)
Creatinine, Ser: 0.93 mg/dL (ref 0.44–1.00)
GFR calc Af Amer: >60 mL/min (ref >60)
GFR calc non Af Amer: >60 mL/min (ref >60)
Glucose, Bld: 108 mg/dL — ABNORMAL HIGH (ref 65–99)
Potassium: 2.8 mmol/L — CL (ref 3.5–5.1)
Sodium: 138 mmol/L (ref 135–145)

## 2015-07-17 LAB — PROTIME-INR
INR: 1.87
Prothrombin Time: 21.7 seconds — ABNORMAL HIGH (ref 11.4–15.0)

## 2015-07-17 LAB — PREPARE FRESH FROZEN PLASMA
Unit division: 0
Unit division: 0

## 2015-07-17 LAB — CK: Total CK: >50000 U/L — ABNORMAL HIGH (ref 38–234)

## 2015-07-17 LAB — POTASSIUM: Potassium: 3.3 mmol/L — ABNORMAL LOW (ref 3.5–5.1)

## 2015-07-17 LAB — PHOSPHORUS: Phosphorus: 2.6 mg/dL (ref 2.5–4.6)

## 2015-07-17 LAB — MAGNESIUM: Magnesium: 1.9 mg/dL (ref 1.7–2.4)

## 2015-07-17 MED ORDER — CETYLPYRIDINIUM CHLORIDE 0.05 % MT LIQD: 7.0000 mL | Freq: Two times a day (BID) | OROMUCOSAL | Status: DC

## 2015-07-17 MED ORDER — POTASSIUM CHLORIDE 10 MEQ/50ML IV SOLN: 10.0000 meq | INTRAVENOUS | Status: DC

## 2015-07-17 MED ORDER — POTASSIUM CHLORIDE 10 MEQ/100ML IV SOLN
10.0000 meq | INTRAVENOUS | Status: AC
  Administered 2015-07-17 (×4): 10 meq via INTRAVENOUS
  Filled 2015-07-17 (×4): qty 100

## 2015-07-17 NOTE — Progress Notes (Signed)
[facility] Medicine Progess Note    ASSESSMENT/PLAN       ASSESSMENT/PLAN   69 yo white female with underlying severe PVD with acute hypoxic resp failure likely from acute sepsis and metabolic acidosis with acute renal failure with acute left leg ischemia, the patient is now status post above-knee amputation.  PULMONARY -Respiratory Failure, doing better -Weaning trial this morning and extubate as tolerated. -continue Bronchodilator Therapy -Abx for possible RLL PNA. -Check sputum cultures. -Discontinue bicarbonate drip.   CARDIOVASCULAR -Hypotension due to sepsis, doing better. We'll wean down the Levophed as tolerated -monitor hemodynamics.   RENAL -Acute renal failure from ATN, doing better -follow UO, chem7  nephrology consulted  GASTROINTESTINAL OG placed -The patient can start eating. After extubation.  HEMATOLOGIC Follow CBC   INFECTIOUS -empiric abx -blood cultures  ENDOCRINE follow FSBS  NEUROLOGIC - intubated and sedated - minimal sedation to achieve a RASS goal: -1 -use fentanyl infusion   EXT_ischemic lower ext -follow up vasc surgery recs -on heparin infusion      ANTIMICROBIALS:   MAJOR EVENTS/TEST RESULTS:    INDWELLING DEVICES:: 9/28 intubated 8.0 ETT>>> 9/28 Left IJ CVL>>> 9/28 RT FEM A LINE>>  MICRO DATA: MRSA PCR >> Blood x 2 9/28>>  ANTIMICROBIALS:  9/28 zosyn>> 9/28 Levaquin>> discontinued October 1 9/28 Vanc>>  Chest x-ray image and report were reviewed today, shows continued changes of right lower lobe, possible pneumonia. ---------------------------------------   ----------------------------------------   Name: [name] MRN: [id number] DOB: 13-Mar-1946    ADMISSION DATE:  07/14/2015    CHIEF COMPLAINT:  Dyspnea   STUDIES:     SUBJECTIVE:  Patient is currently awake and alert on the ventilator. She appears to respond to some questions and follow  commands.   Review of Systems:  Pt currently on the ventilator, can not provide history or review of systems.    VITAL SIGNS: Temp:  [98.3 F (36.8 C)-99.3 F (37.4 C)] 99.1 F (37.3 C) (10/01 0400) Pulse Rate:  [64-106] 87 (10/01 0800) Resp:  [15-32] 17 (10/01 0800) BP: (84-127)/(49-98) 113/65 mmHg (10/01 0800) SpO2:  [93 %-100 %] 95 % (10/01 0800) Arterial Line BP: (91-144)/(40-69) 123/69 mmHg (10/01 0800) FiO2 (%):  [30 %-50 %] 35 % (10/01 0811) Weight:  [50.1 kg (110 lb 7.2 oz)] 50.1 kg (110 lb 7.2 oz) (10/01 0436) HEMODYNAMICS:   VENTILATOR SETTINGS: Vent Mode:  [-] PSV FiO2 (%):  [30 %-50 %] 35 % Set Rate:  [32 bmp] 32 bmp Vt Set:  [400 mL] 400 mL PEEP:  [5 cmH20] 5 cmH20 Pressure Support:  [5 cmH20] 5 cmH20 INTAKE / OUTPUT:  Intake/Output Summary (Last 24 hours) at 07/17/15 0834 Last data filed at 07/17/15 0700  Gross per 24 hour  Intake 4090.9 ml  Output   1540 ml  Net 2550.9 ml    PHYSICAL EXAMINATION: Physical Examination:   VS: BP 113/65 mmHg  Pulse 87  Temp(Src) 99.1 F (37.3 C) (Oral)  Resp 17  Ht  (1.651 m)  Wt 50.1 kg (110 lb 7.2 oz)  BMI 18.38 kg/m2  SpO2 95%  General Appearance: No distress  Neuro:without focal findings, mental status normal. HEENT: PERRLA, EOM intact. Pulmonary: normal breath sounds   CardiovascularNormal S1,S2.  No m/r/g.   Abdomen: Benign, Soft, non-tender. Renal:  No costovertebral tenderness  GU:  Not performed at this time. Endocrine: No evident thyromegaly. Skin:   warm, no rashes, no ecchymosis  Extremities: normal, no cyanosis, clubbing.   LABS:  LABORATORY PANEL:   CBC  Recent Labs Lab 07/17/15 0430  WBC 9.4  HGB 7.7*  HCT 22.5*  PLT 116*    Chemistries   Recent Labs Lab 07/14/15 0410  07/17/15 0430  NA 135  < > 138  K 3.4*  < > 2.8*  CL 99*  < > 99*  CO2 14*  < > 31  GLUCOSE 195*  < > 108*  BUN 71*  < > 22*  CREATININE 2.18*  < > 0.93  CALCIUM 8.6*  < > 6.9*  MG  --   < >  1.9  PHOS  --   < > 2.6  AST 91*  --   --   ALT 30  --   --   ALKPHOS 110  --   --   BILITOT 0.8  --   --   < > = values in this interval not displayed.   Recent Labs Lab 07/14/15 1803  GLUCAP 107*    Recent Labs Lab 07/15/15 0508 07/17/15 0351 07/17/15 0602  PHART 7.49* 7.60* 7.52*  PCO2ART 29* 29* 37  PO2ART 465* 131* 94    Recent Labs Lab 07/14/15 0410  AST 91*  ALT 30  ALKPHOS 110  BILITOT 0.8  ALBUMIN 3.6    Cardiac Enzymes No results for input(s): TROPONINI in the last 168 hours.  RADIOLOGY:  Dg Chest 1 View  07/17/2015   CLINICAL DATA:  hypoxia  EXAM: CHEST 1 VIEW  COMPARISON:  July 15, 2015  FINDINGS: Endotracheal tube tip is 2.7 cm above the carina. Central catheter tip is in the superior vena cava. Nasogastric tube tip and side port below the diaphragm. No pneumothorax. There is extensive interstitial opacity throughout the right lung. There are small pleural effusions bilaterally. There is consolidation in the left base. Heart is upper normal in size with pulmonary vascularity within normal limits. No adenopathy.  IMPRESSION: Tube and catheter positions without pneumothorax. Interstitial opacity throughout the right lung. The unilateral nature of this opacity is more suggestive of pneumonia than interstitial edema, although atypical interstitial edema/congestive heart failure is possible. Both entities may be present concurrently. There are small pleural effusions bilaterally. There is focal consolidation in the left base which enlarged part may well represent atelectasis. There may be alveolar edema superimposed in this area. No new opacity appreciable. No change in cardiac silhouette.   Electronically Signed   By: Bretta Bang III M.D.   On: 07/17/2015 07:33   Dg Abd Portable 1v  07/15/2015   CLINICAL DATA:  Nausea and vomiting  EXAM: PORTABLE ABDOMEN - 1 VIEW  COMPARISON:  07/14/2015  FINDINGS: Scattered large and small bowel gas is noted. A  nasogastric catheter is noted within the stomach. Multiple arterial stents are seen bilaterally. Femoral arterial catheter is noted. No free air is seen. No bony abnormality is noted.  IMPRESSION: No acute abnormality seen.   Electronically Signed   By: Alcide Clever M.D.   On: 07/15/2015 17:08       --Wells Guiles, MD.  Pager [phone] [facility] Office Number: [phone]  Santiago Glad, M.D.  Stephanie Acre, M.D.  Billy Fischer, M.D

## 2015-07-17 NOTE — Progress Notes (Signed)
Patient resting quietly between care. Levophed restarted based on MD parameters, BP now WNL. Will continue to monitor

## 2015-07-17 NOTE — Progress Notes (Signed)
eLink Physician-Brief Progress Note Patient Name: [name] DOB: 1946/04/03 MRN: [id number]   Date of Service  07/17/2015  HPI/Events of Note  Resp alkalosis with pH 7.6/29/131/28  eICU Interventions  Plan: Reduce RR on vent from 32 to 18 Repeat ABG in 2 hours post vent change     Intervention Category Major Interventions: Acid-Base disturbance - evaluation and management  DETERDING,ELIZABETH 07/17/2015, 4:14 AM

## 2015-07-17 NOTE — Progress Notes (Signed)
Assumed care for patient at 1530. Patient is alert and oriented with no complaints of pain. 2L nsal cannula with oxygen saturation WNL. Dressing to left stump with moderate amt moist drainage. Will continue to monitor.

## 2015-07-17 NOTE — Progress Notes (Signed)
Nutrition Follow-up   INTERVENTION:    Coordination of Care: will await diet progression as medically able as pt now s/p extubation Medical Food Supplement Therapy: pt would benefit from supplement; will recommend on follow once diet order advanced   NUTRITION DIAGNOSIS:   Inadequate oral intake related to acute illness as evidenced by NPO status.  GOAL:   Patient will meet greater than or equal to 90% of their needs  MONITOR:    (Energy Intake, Anthropometrics, Digestive System, Electrolyte/Renal Profile)   ASSESSMENT:   Pt s/p AKA yesterday. Pt also s/p extubation this am on rounds. Pt sitting up this am on [place].  Diet Order:  Diet NPO time specified    Current Nutrition: Pt tolerating vital high protein at 37mL/hr documented as at 7am this morning with 20-79mL residuals   Gastrointestinal Profile: Last BM: 9/28   Medications: NaCL 3% injection, pepcid, KCl given  Electrolyte/Renal Profile and Glucose Profile:   Recent Labs Lab 07/14/15 2358 07/15/15 0613 07/16/15 0510 07/17/15 0430  NA 144 144 138 138  K 4.4 4.1 3.2* 2.8*  CL 119* 111 101 99*  CO2 19* BUN 52* 49* 27* 22*  CREATININE 1.23* 1.08* 1.05* 0.93  CALCIUM 7.0* 6.9* 6.9* 6.9*  MG 2.2  --   --  1.9  PHOS 5.1*  --   --  2.6  GLUCOSE 124* 118* 122* 108*   Protein Profile:   Recent Labs Lab 07/14/15 0410  ALBUMIN 3.6     Weight Trend since Admission: Filed Weights   07/14/15 0820 07/16/15 0600 07/17/15 0436  Weight: 102 lb 1.6 oz (46.312 kg) 113 lb 12.1 oz (51.6 kg) 110 lb 7.2 oz (50.1 kg)     Skin:   (stage II pressure ulcer on heel)    BMI:  Body mass index is 18.38 kg/(m^2).  Estimated Nutritional Needs:   Kcal:  using current weight of 50kg, BEE: 1020kcals, TEE: (IF 1.2-1.4)(AF 1.2) 1470-1715kcals  Protein:  60-75g protein (1.2-1.5g/kg)   Fluid:  1250-1510mL of fluid (25-37mL/kg)   HIGH Care Level  Leda Quail, RD, LDN Pager [phone]

## 2015-07-17 NOTE — Anesthesia Postprocedure Evaluation (Signed)
  Anesthesia Post-op Note  Patient: [name]  Procedure(s) Performed: Procedure(s): AMPUTATION ABOVE KNEE (Left)  Anesthesia type:General ETT  Patient location: PACU  Post pain: Pain level controlled  Post assessment: Post-op Vital signs reviewed, Patient's Cardiovascular Status Stable, Respiratory Function Stable, Patent Airway and No signs of Nausea or vomiting, extubated this am breathing spontaneously.Marland Kitchen  Post vital signs: Reviewed and stable  Last Vitals:  Filed Vitals:   07/17/15 0800  BP: 113/65  Pulse: 87  Temp: 36.8 C  Resp: 17    Level of consciousness: awake, alert  and patient cooperative  Complications: No apparent anesthesia complications

## 2015-07-17 NOTE — Progress Notes (Signed)
[facility] [place] [facility]   PATIENT NAME: [name]    MR#:  [id number]  DATE OF BIRTH:  1946-08-01  SUBJECTIVE:  Pt ot intubated and on the vent for hypoxia ad acidosis. Post op left AKA day 1 On the vent. Overall stable.  REVIEW OF SYSTEMS:  Pt is on MV DRUG ALLERGIES:   Allergies  Allergen Reactions  . Morphine And Related Diarrhea and Nausea And Vomiting    VITALS:  Blood pressure 113/65, pulse 87, temperature 98.2 F (36.8 C), temperature source Oral, resp. rate 17, height  (1.651 m), weight 50.1 kg (110 lb 7.2 oz), SpO2 95 %.  PHYSICAL EXAMINATION:  GENERAL:  69 y.o.-year-old patient lying in the bed with no acute distress. Intubated and on the vent EYES: Pupils equal, round, sluggishly reactive to light and accommodation. No scleral icterus.  HEENT: Head atraumatic, normocephalic. Oropharynx with ET tube NECK:  Supple, no jugular venous distention.  LUNGS: Diminished breath sounds on rt greater than left, no wheezing,or crepitation.  CARDIOVASCULAR: S1, S2 normal. No murmurs, rubs, or gallops.  ABDOMEN: Soft, nontender, nondistended. Bowel sounds present. No organomegaly or mass.  EXTREMITIES: bilateral amputation. Left AKA stump + with surgical dressing NEUROLOGIC: INTUBATED  PSYCHIATRIC: The patient is on MV  LABORATORY PANEL:   CBC  Recent Labs Lab 07/17/15 0430  WBC 9.4  HGB 7.7*  HCT 22.5*  PLT 116*   ------------------------------------------------------------------------------------------------------------------  Chemistries   Recent Labs Lab 07/14/15 0410  07/17/15 0430  NA 135  < > 138  K 3.4*  < > 2.8*  CL 99*  < > 99*  CO2 14*  < > 31  GLUCOSE 195*  < > 108*  BUN 71*  < > 22*  CREATININE 2.18*  < > 0.93  CALCIUM 8.6*  < > 6.9*  MG  --   < > 1.9  AST 91*  --   --   ALT 30  --   --   ALKPHOS 110  --   --   BILITOT 0.8  --   --   < > = values in this interval not  displayed. ------------------------------------------------------------------------------------------------------------------  Cardiac Enzymes No results for input(s): TROPONINI in the last 168 hours. ------------------------------------------------------------------------------------------------------------------  RADIOLOGY:  Dg Chest 1 View  07/17/2015   CLINICAL DATA:  hypoxia  EXAM: CHEST 1 VIEW  COMPARISON:  July 15, 2015  FINDINGS: Endotracheal tube tip is 2.7 cm above the carina. Central catheter tip is in the superior vena cava. Nasogastric tube tip and side port below the diaphragm. No pneumothorax. There is extensive interstitial opacity throughout the right lung. There are small pleural effusions bilaterally. There is consolidation in the left base. Heart is upper normal in size with pulmonary vascularity within normal limits. No adenopathy.  IMPRESSION: Tube and catheter positions without pneumothorax. Interstitial opacity throughout the right lung. The unilateral nature of this opacity is more suggestive of pneumonia than interstitial edema, although atypical interstitial edema/congestive heart failure is possible. Both entities may be present concurrently. There are small pleural effusions bilaterally. There is focal consolidation in the left base which enlarged part may well represent atelectasis. There may be alveolar edema superimposed in this area. No new opacity appreciable. No change in cardiac silhouette.   Electronically Signed   By: Bretta Bang III M.D.   On: 07/17/2015 07:33   Dg Abd Portable 1v  07/15/2015   CLINICAL DATA:  Nausea and vomiting  EXAM: PORTABLE ABDOMEN - 1  VIEW  COMPARISON:  07/14/2015  FINDINGS: Scattered large and small bowel gas is noted. A nasogastric catheter is noted within the stomach. Multiple arterial stents are seen bilaterally. Femoral arterial catheter is noted. No free air is seen. No bony abnormality is noted.  IMPRESSION: No acute  abnormality seen.   Electronically Signed   By: Alcide Clever M.D.   On: 07/15/2015 17:08    EKG:   Orders placed or performed in visit on 11/06/14  . EKG 12-Lead    ASSESSMENT AND PLAN:    69 year old female admitted for arterial thrombus of the LLE.  1. Septic shock his metabolic acidosis with PNA and prob LE infection  Pressors to titrate map greater than or equal to 65   continue hydration with IVF Broad spectrum abx coverage with Zosyn -d/ced Levaquin and vancomycin  -appreciate ID input -BC negative  2. Left lower extremity with severe ischemia , elevated CK  LLE was pulseless, dusky, and very painful from severe ischemia now s/op left AKA day 1 H/o stents to extremity. D/C HEparin gtt in view of anemia    2. Severe anemia  Status post blood transfusion and hemoglobin is at 8.4  3. Acute  respiratory failure secondary to Pneumonia: community-acquired;   Patient is intubated  on September 28 Status post central line and arterial line Appreciate critical care recommendations Vent management per critical care team Continue broad-spectrum antibiotic Zosyn  4. 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 Renal function significantly improved with the aggressive hydration  avoid nephrotoxic agents.  Appreciate nephrology recommendations   5. Tobacco abuse: Patient still continues to smoke.  -on Nicoderm patch  6. DVT pxs: heparin drip is discontinued in view of severe anemia  All the records are reviewed and case discussed with Care Management/Social Workerr. Management plans discussed with the  family and they are in agreement.  CODE STATUS:Full code (per husband)  TOTAL  critical care TIME TAKING CARE OF THIS PATIENT: 35 minutes.    Bailey Faiella M.D on 07/17/2015   Between 7am to 6pm - Pager - [phone] After 6pm go to [email or web address] - password [email or web address] [facility]  [facility]  Office  [phone]  CC: Primary care physician; No  primary care provider on file.

## 2015-07-17 NOTE — Progress Notes (Signed)
   07/17/15 0930  Clinical Encounter Type  Visited With Patient and family together  Visit Type Spiritual support  Referral From Nurse  Consult/Referral To Chaplain  Requested AD education. Patient/family not ready to proceed.

## 2015-07-17 NOTE — Progress Notes (Signed)
Subjective:  Patient is doing fair Able to follow commands  S Cr remains good Family at bedside Underwent left ANA yesterday   Objective:  Vital signs in last 24 hours:  Temp:  [98.2 F (36.8 C)-99.3 F (37.4 C)] 98.2 F (36.8 C) (10/01 0800) Pulse Rate:  [64-106] 87 (10/01 0800) Resp:  [15-32] 17 (10/01 0800) BP: (84-127)/(49-98) 113/65 mmHg (10/01 0800) SpO2:  [93 %-100 %] 95 % (10/01 0800) Arterial Line BP: (91-144)/(40-69) 123/69 mmHg (10/01 0800) FiO2 (%):  [30 %-50 %] 35 % (10/01 0811) Weight:  [50.1 kg (110 lb 7.2 oz)] 50.1 kg (110 lb 7.2 oz) (10/01 0436)  Weight change: -1.5 kg (-3 lb 4.9 oz) Filed Weights   07/14/15 0820 07/16/15 0600 07/17/15 0436  Weight: 46.312 kg (102 lb 1.6 oz) 51.6 kg (113 lb 12.1 oz) 50.1 kg (110 lb 7.2 oz)    Intake/Output:    Intake/Output Summary (Last 24 hours) at 07/17/15 0850 Last data filed at 07/17/15 0700  Gross per 24 hour  Intake 4090.9 ml  Output   1540 ml  Net 2550.9 ml     Physical Exam: General: Frail, elderly laying in bed  HEENT Anicteric, ETT in place  Neck supple  Pulm/lungs Coarse bl  CVS/Heart No rub  Abdomen:  Soft, non tender  Extremities: b/l AKA  Neurologic: Able to follow commands  Skin:   Access:        Basic Metabolic Panel:   Recent Labs Lab 07/14/15 0410 07/14/15 2358 07/15/15 0613 07/16/15 0510 07/17/15 0430  NA 135 144 144 138 138  K 3.4* 4.4 4.1 3.2* 2.8*  CL 99* 119* 111 101 99*  CO2 14* 19* GLUCOSE 195* 124* 118* 122* 108*  BUN 71* 52* 49* 27* 22*  CREATININE 2.18* 1.23* 1.08* 1.05* 0.93  CALCIUM 8.6* 7.0* 6.9* 6.9* 6.9*  MG  --  2.2  --   --  1.9  PHOS  --  5.1*  --   --  2.6     CBC:  Recent Labs Lab 07/14/15 0410 07/14/15 2358 07/15/15 0613 07/15/15 1155 07/16/15 0510 07/17/15 0430  WBC 32.0* 15.2* 12.5*  --  13.4* 9.4  NEUTROABS  --  13.2*  --   --   --   --   HGB 9.7* 6.3* 7.6* 9.0* 8.4* 7.7*  HCT 30.8* 20.0* 23.2* 27.7* 24.9* 22.5*  MCV 89.4  88.6 89.6  --  85.9 88.0  PLT 383 255 201  --  162 116*      Microbiology:  Recent Results (from the past 720 hour(s))  Culture, blood (routine x 2)     Status: None (Preliminary result)   Collection Time: 07/14/15  6:53 AM  Result Value Ref Range Status   Specimen Description BLOOD RIGHT ANTECUBITAL  Final   Special Requests BOTTLES DRAWN AEROBIC AND ANAEROBIC 5CC  Final   Culture NO GROWTH 1 DAY  Final   Report Status PENDING  Incomplete  Culture, blood (routine x 2)     Status: None (Preliminary result)   Collection Time: 07/14/15  6:53 AM  Result Value Ref Range Status   Specimen Description BLOOD RIGHT ANTECUBITAL  Final   Special Requests BOTTLES DRAWN AEROBIC AND ANAEROBIC 5CC  Final   Culture NO GROWTH 1 DAY  Final   Report Status PENDING  Incomplete  MRSA PCR Screening     Status: None   Collection Time: 07/14/15  8:27 PM  Result Value Ref Range Status  MRSA by PCR NEGATIVE NEGATIVE Final    Comment:        The GeneXpert MRSA Assay (FDA approved for NASAL specimens only), is one component of a comprehensive MRSA colonization surveillance program. It is not intended to diagnose MRSA infection nor to guide or monitor treatment for MRSA infections.     Coagulation Studies:  Recent Labs  07/16/15 0510 07/16/15 1509 07/17/15 0430  LABPROT 58.2* 20.2* 21.7*  INR 6.75* 1.70 1.87    Urinalysis: No results for input(s): COLORURINE, LABSPEC, PHURINE, GLUCOSEU, HGBUR, BILIRUBINUR, KETONESUR, PROTEINUR, UROBILINOGEN, NITRITE, LEUKOCYTESUR in the last 72 hours.  Invalid input(s): APPERANCEUR    Imaging: Dg Chest 1 View  07/17/2015   CLINICAL DATA:  hypoxia  EXAM: CHEST 1 VIEW  COMPARISON:  July 15, 2015  FINDINGS: Endotracheal tube tip is 2.7 cm above the carina. Central catheter tip is in the superior vena cava. Nasogastric tube tip and side port below the diaphragm. No pneumothorax. There is extensive interstitial opacity throughout the right lung. There  are small pleural effusions bilaterally. There is consolidation in the left base. Heart is upper normal in size with pulmonary vascularity within normal limits. No adenopathy.  IMPRESSION: Tube and catheter positions without pneumothorax. Interstitial opacity throughout the right lung. The unilateral nature of this opacity is more suggestive of pneumonia than interstitial edema, although atypical interstitial edema/congestive heart failure is possible. Both entities may be present concurrently. There are small pleural effusions bilaterally. There is focal consolidation in the left base which enlarged part may well represent atelectasis. There may be alveolar edema superimposed in this area. No new opacity appreciable. No change in cardiac silhouette.   Electronically Signed   By: Bretta Bang III M.D.   On: 07/17/2015 07:33   Dg Abd Portable 1v  07/15/2015   CLINICAL DATA:  Nausea and vomiting  EXAM: PORTABLE ABDOMEN - 1 VIEW  COMPARISON:  07/14/2015  FINDINGS: Scattered large and small bowel gas is noted. A nasogastric catheter is noted within the stomach. Multiple arterial stents are seen bilaterally. Femoral arterial catheter is noted. No free air is seen. No bony abnormality is noted.  IMPRESSION: No acute abnormality seen.   Electronically Signed   By: Alcide Clever M.D.   On: 07/15/2015 17:08     Medications:   . sodium chloride    . feeding supplement (VITAL AF 1.2 CAL) 1,000 mL (07/17/15 0420)  . fentaNYL infusion INTRAVENOUS 250 mcg/hr (07/17/15 0443)  . norepinephrine (LEVOPHED) Adult infusion 6 mcg/min (07/17/15 0000)   . antiseptic oral rinse  7 mL Mouth Rinse QID  . aspirin  81 mg Oral Daily  . chlorhexidine gluconate  15 mL Mouth Rinse BID  . famotidine  20 mg Oral Daily  . free water  200 mL Per Tube 3 times per day  . hydrocortisone sod succinate (SOLU-CORTEF) inj  50 mg Intravenous Q6H  . piperacillin-tazobactam (ZOSYN)  IV  3.375 g Intravenous 3 times per day  . potassium  chloride  10 mEq Intravenous Q1 Hr x 4  . sodium chloride  3 mL Intravenous Q12H   Place/Maintain arterial line **AND** sodium chloride, fentaNYL, HYDROmorphone (DILAUDID) injection, HYDROmorphone, magnesium hydroxide, ondansetron **OR** ondansetron (ZOFRAN) IV, oxyCODONE-acetaminophen  Assessment/ Plan:  69 y.o. female with a PMHX of severe peripheral vascular disease, was admitted on 07/14/2015 with limb threatening ischemia.  1. Acute renal failure, likely ATN and volume depletion. Serum creatinine is improving Supportive care at present Agree with d/c iv bicarb  2.  acute respiratory failure. Right lung pneumonia noted on chest x-ray  3. Acute on chronic left lower extremity ischemia. s/p left leg amputation 07/16/15   LOS: 3 Chelise Hanger 10/1/20168:50 AM

## 2015-07-17 NOTE — Progress Notes (Signed)
eLink Physician-Brief Progress Note Patient Name: EDIA PURSIFULL DOB: 1946-03-25 MRN: [id number]   Date of Service  07/17/2015  HPI/Events of Note  Hypokalemia  eICU Interventions  Potassium replaced     Intervention Category Intermediate Interventions: Electrolyte abnormality - evaluation and management  Fabricio Endsley 07/17/2015, 6:23 AM

## 2015-07-17 NOTE — Progress Notes (Signed)
 Vein and Vascular Surgery  Daily Progress Note   Subjective  - 1 Day Post-Op  Patient is now extubated sitting up in bed carrying on a conversation. She denies pain. She does note a fair bit of nausea  Objective Filed Vitals:   07/17/15 0500 07/17/15 0600 07/17/15 0700 07/17/15 0800  BP: 109/63 103/57 84/49 113/65  Pulse: 70 72 72 87  Temp:    98.2 F (36.8 C)  TempSrc:    Oral  Resp: Height:      Weight:      SpO2: 95% 95% 95% 95%    Intake/Output Summary (Last 24 hours) at 07/17/15 1201 Last data filed at 07/17/15 0849  Gross per 24 hour  Intake 3802.25 ml  Output   1540 ml  Net 2262.25 ml    PULM  Normal effort , no use of accessory muscles CV  No JVD, RRR Abd      No distended, nontender VASC  open left leg amputation with dressing clean dry and intact Well-healed right below-knee amputation  Laboratory CBC    Component Value Date/Time   WBC 9.4 07/17/2015 0430   WBC 10.4 02/12/2015 1641   HGB 7.7* 07/17/2015 0430   HGB 8.6* 02/12/2015 1641   HCT 22.5* 07/17/2015 0430   HCT 25.9* 02/12/2015 1641   PLT 116* 07/17/2015 0430   PLT 343 02/12/2015 1641    BMET    Component Value Date/Time   NA 138 07/17/2015 0430   NA 131* 02/13/2015 0426   K 2.8* 07/17/2015 0430   K 3.0* 02/13/2015 0426   CL 99* 07/17/2015 0430   CL 103 02/13/2015 0426   CO2 31 07/17/2015 0430   CO2 23 02/13/2015 0426   GLUCOSE 108* 07/17/2015 0430   GLUCOSE 104* 02/13/2015 0426   BUN 22* 07/17/2015 0430   BUN 10 02/13/2015 0426   CREATININE 0.93 07/17/2015 0430   CREATININE 0.83 02/13/2015 0426   CALCIUM 6.9* 07/17/2015 0430   CALCIUM 7.3* 02/13/2015 0426   GFRNONAA >60 07/17/2015 0430   GFRNONAA >60 02/13/2015 0426   GFRNONAA 51* 11/08/2014 0618   GFRAA >60 07/17/2015 0430   GFRAA >60 02/13/2015 0426   GFRAA >60 11/08/2014 0618    Assessment/Planning: POD #1 s/p open left above-knee amputation   The patient's laboratory data shows improvement  across the board with the exception of the elevated CK myoglobin is pending. However, given the normalization of her renal function the mild elevation of her bicarbonate and her pH of 7.4 as well as her relatively low potassium all parameters indicating that we have now solved the problem of ischemic tissue poisoning the patient has been successful.    Once again the plan remains that she will need an angiogram to ensure her iliac system is patent and once this is been verified then formal revision of her left above-knee amputation will be performed. At this time her pain control seems to be quite good and it all other respects she is improving. Plans will be for angiogram early this week    Curley Hogen, Latina Craver  07/17/2015, 12:01 PM

## 2015-07-17 NOTE — Progress Notes (Signed)
Placed on high fowlers position, cuff deflated, suctioned orally and endotracheally and extubated to 4 lpm O2 [place]

## 2015-07-18 LAB — CBC
HCT: 22 % — ABNORMAL LOW (ref 35.0–47.0)
Hemoglobin: 7.2 g/dL — ABNORMAL LOW (ref 12.0–16.0)
MCH: 29 pg (ref 26.0–34.0)
MCHC: 32.7 g/dL (ref 32.0–36.0)
MCV: 88.6 fL (ref 80.0–100.0)
Platelets: 114 10*3/uL — ABNORMAL LOW (ref 150–440)
RBC: 2.49 MIL/uL — ABNORMAL LOW (ref 3.80–5.20)
RDW: 16.7 % — ABNORMAL HIGH (ref 11.5–14.5)
WBC: 11.7 10*3/uL — ABNORMAL HIGH (ref 3.6–11.0)

## 2015-07-18 LAB — BASIC METABOLIC PANEL
Anion gap: 9 (ref 5–15)
BUN: 20 mg/dL (ref 6–20)
CO2: 31 mmol/L (ref 22–32)
Calcium: 7.3 mg/dL — ABNORMAL LOW (ref 8.9–10.3)
Chloride: 98 mmol/L — ABNORMAL LOW (ref 101–111)
Creatinine, Ser: 0.8 mg/dL (ref 0.44–1.00)
GFR calc Af Amer: >60 mL/min (ref >60)
GFR calc non Af Amer: >60 mL/min (ref >60)
Glucose, Bld: 110 mg/dL — ABNORMAL HIGH (ref 65–99)
Potassium: 2.7 mmol/L — CL (ref 3.5–5.1)
Sodium: 138 mmol/L (ref 135–145)

## 2015-07-18 LAB — POTASSIUM
Potassium: 2.7 mmol/L — CL (ref 3.5–5.1)
Potassium: 3 mmol/L — ABNORMAL LOW (ref 3.5–5.1)

## 2015-07-18 LAB — MAGNESIUM: Magnesium: 1.9 mg/dL (ref 1.7–2.4)

## 2015-07-18 MED ORDER — POTASSIUM CHLORIDE 10 MEQ/100ML IV SOLN
10.0000 meq | INTRAVENOUS | Status: AC
  Administered 2015-07-18 (×4): 10 meq via INTRAVENOUS
  Filled 2015-07-18 (×4): qty 100

## 2015-07-18 MED ORDER — POTASSIUM CHLORIDE 20 MEQ PO PACK
20.0000 meq | PACK | Freq: Once | ORAL | Status: AC
  Administered 2015-07-18: 20 meq via ORAL
  Filled 2015-07-18: qty 1

## 2015-07-18 MED ORDER — INFLUENZA VAC SPLIT QUAD 0.5 ML IM SUSY: 0.5000 mL | PREFILLED_SYRINGE | INTRAMUSCULAR | Status: DC

## 2015-07-18 MED ORDER — POTASSIUM CHLORIDE 10 MEQ/100ML IV SOLN
10.0000 meq | INTRAVENOUS | Status: AC
  Administered 2015-07-18 – 2015-07-19 (×5): 10 meq via INTRAVENOUS
  Filled 2015-07-18 (×5): qty 100

## 2015-07-18 NOTE — Progress Notes (Signed)
[facility] Critical Care Medicine Progess Note    ASSESSMENT/PLAN       ASSESSMENT/PLAN   69 yo white female with underlying severe PVD with acute hypoxic resp failure likely from acute sepsis and metabolic acidosis with acute renal failure with acute left leg ischemia, the patient is now status post above-knee amputation.  PULMONARY -Respiratory Failure, doing better Patient was extubated on 07/17/2015. -continue Bronchodilator Therapy -Abx for  RLL PNA. -. Sputum cultures pending.    CARDIOVASCULAR -Stable  RENAL -Acute renal failure from ATN, doing better -follow UO, chem7  nephrology following  GASTROINTESTINAL Stable, the patient is eating. We'll continue H2 blocker.  HEMATOLOGIC Anemia, post OR yesterday. We'll transfuse 1 unit.   INFECTIOUS -empiric abx, sputum cultures pending.  ENDOCRINE follow FSBS  NEUROLOGIC Awake and alert today. No acute issues. EXT_ischemic lower ext -follow up vasc surgery recs -on heparin infusion      Stable for transfer out of the intensive care unit from respiratory standpoint.  MAJOR EVENTS/TEST RESULTS:    INDWELLING DEVICES:: 9/28 intubated 8.0 ETT>>> 9/28 Left IJ CVL>>> 9/28 RT FEM A LINE>>  MICRO DATA: MRSA PCR >> Blood x 2 9/28>>  ANTIMICROBIALS:  9/28 zosyn>> 9/28 Levaquin>> discontinued October 1 9/28 Vanc>>  Chest x-ray image and report were reviewed today, shows continued changes of right lower lobe, possible pneumonia. ---------------------------------------   ----------------------------------------   Name: [name] MRN: [id number] DOB: January 26, 1946    ADMISSION DATE:  07/14/2015    CHIEF COMPLAINT:  Dyspnea   STUDIES:     SUBJECTIVE:  Patient is currently awake and alert on the ventilator. She appears to respond to some questions and follow commands.   Review of Systems:  Pt currently on the ventilator, can not provide history or review of systems.     VITAL SIGNS: Temp:  [98.2 F (36.8 C)-98.8 F (37.1 C)] 98.6 F (37 C) (10/02 0445) Pulse Rate:  [49-129] 80 (10/02 0600) Resp:  [5-21] 10 (10/02 0600) BP: (80-121)/(46-90) 110/55 mmHg (10/02 0600) SpO2:  [87 %-100 %] 91 % (10/02 0600) Arterial Line BP: (92-141)/(45-71) 115/54 mmHg (10/02 0600) HEMODYNAMICS:   VENTILATOR SETTINGS:   INTAKE / OUTPUT:  Intake/Output Summary (Last 24 hours) at 07/18/15 0957 Last data filed at 07/18/15 4098  Gross per 24 hour  Intake 467.69 ml  Output    925 ml  Net -457.31 ml    PHYSICAL EXAMINATION: Physical Examination:   VS: BP 110/55 mmHg  Pulse 80  Temp(Src) 98.6 F (37 C) (Oral)  Resp 10  Ht  (1.651 m)  Wt 50.1 kg (110 lb 7.2 oz)  BMI 18.38 kg/m2  SpO2 91%  General Appearance: No distress  Neuro:without focal findings, mental status normal. HEENT: PERRLA, EOM intact. Pulmonary: normal breath sounds   CardiovascularNormal S1,S2.  No m/r/g.   Abdomen: Benign, Soft, non-tender. Renal:  No costovertebral tenderness  GU:  Not performed at this time. Endocrine: No evident thyromegaly. Skin:   warm, no rashes, no ecchymosis  Extremities: normal, no cyanosis, clubbing.   LABS:   LABORATORY PANEL:   CBC  Recent Labs Lab 07/18/15 0544  WBC 11.7*  HGB 7.2*  HCT 22.0*  PLT 114*    Chemistries   Recent Labs Lab 07/14/15 0410  07/17/15 0430  07/18/15 0544  NA 135  < > 138  --  138  K 3.4*  < > 2.8*  < > 2.7*  CL 99*  < > 99*  --  98*  CO2  14*  < > 31  --  31  GLUCOSE 195*  < > 108*  --  110*  BUN 71*  < > 22*  --  20  CREATININE 2.18*  < > 0.93  --  0.80  CALCIUM 8.6*  < > 6.9*  --  7.3*  MG  --   < > 1.9  --   --   PHOS  --   < > 2.6  --   --   AST 91*  --   --   --   --   ALT 30  --   --   --   --   ALKPHOS 110  --   --   --   --   BILITOT 0.8  --   --   --   --   < > = values in this interval not displayed.   Recent Labs Lab 07/14/15 1803  GLUCAP 107*    Recent Labs Lab 07/17/15 0351  07/17/15 0602 07/17/15 0910  PHART 7.60* 7.52* 7.46*  PCO2ART 29* 37 44  PO2ART 131* 94 92    Recent Labs Lab 07/14/15 0410  AST 91*  ALT 30  ALKPHOS 110  BILITOT 0.8  ALBUMIN 3.6    Cardiac Enzymes No results for input(s): TROPONINI in the last 168 hours.  RADIOLOGY:  Dg Chest 1 View  07/17/2015   CLINICAL DATA:  hypoxia  EXAM: CHEST 1 VIEW  COMPARISON:  July 15, 2015  FINDINGS: Endotracheal tube tip is 2.7 cm above the carina. Central catheter tip is in the superior vena cava. Nasogastric tube tip and side port below the diaphragm. No pneumothorax. There is extensive interstitial opacity throughout the right lung. There are small pleural effusions bilaterally. There is consolidation in the left base. Heart is upper normal in size with pulmonary vascularity within normal limits. No adenopathy.  IMPRESSION: Tube and catheter positions without pneumothorax. Interstitial opacity throughout the right lung. The unilateral nature of this opacity is more suggestive of pneumonia than interstitial edema, although atypical interstitial edema/congestive heart failure is possible. Both entities may be present concurrently. There are small pleural effusions bilaterally. There is focal consolidation in the left base which enlarged part may well represent atelectasis. There may be alveolar edema superimposed in this area. No new opacity appreciable. No change in cardiac silhouette.   Electronically Signed   By: Bretta Bang III M.D.   On: 07/17/2015 07:33       --Wells Guiles, MD.  Pager [phone] [place] Office Number: [phone]  Santiago Glad, M.D.  Stephanie Acre, M.D.  Billy Fischer, M.D

## 2015-07-18 NOTE — Progress Notes (Signed)
PHARMACY - CRITICAL CARE PROGRESS NOTE  Pharmacy Consult for Electrolyte Management Indication: hypokalemia   Allergies  Allergen Reactions  . Morphine And Related Diarrhea and Nausea And Vomiting    Patient Measurements: Height:  (165.1 cm) Weight: 110 lb 7.2 oz (50.1 kg) IBW/kg (Calculated) : 57 Adjusted Body Weight: n/a  Vital Signs: Temp: 98.6 F (37 C) (10/02 0445) Temp Source: Oral (10/02 0445) BP: 110/55 mmHg (10/02 0600) Pulse Rate: 80 (10/02 0600) Intake/Output from previous day: 10/01 0701 - 10/02 0700 In: 693.5 [I.V.:343.5; NG/GT:150; IV Piggyback:200] Out: 925 [Urine:925] Intake/Output from this shift:   Vent settings for last 24 hours:    Labs:  Recent Labs  07/16/15 0510 07/16/15 1509 07/17/15 0430 07/18/15 0544  WBC 13.4*  --  9.4 11.7*  HGB 8.4*  --  7.7* 7.2*  HCT 24.9*  --  22.5* 22.0*  PLT 162  --  116* 114*  INR 6.75* 1.70 1.87  --   CREATININE 1.05*  --  0.93 0.80  MG  --   --  1.9  --   PHOS  --   --  2.6  --    Estimated Creatinine Clearance: 51.8 mL/min (by C-G formula based on Cr of 0.8).  No results for input(s): GLUCAP in the last 72 hours.  Microbiology: Recent Results (from the past 720 hour(s))  Culture, blood (routine x 2)     Status: None (Preliminary result)   Collection Time: 07/14/15  6:53 AM  Result Value Ref Range Status   Specimen Description BLOOD RIGHT ANTECUBITAL  Final   Special Requests BOTTLES DRAWN AEROBIC AND ANAEROBIC 5CC  Final   Culture NO GROWTH 3 DAYS  Final   Report Status PENDING  Incomplete  Culture, blood (routine x 2)     Status: None (Preliminary result)   Collection Time: 07/14/15  6:53 AM  Result Value Ref Range Status   Specimen Description BLOOD RIGHT ANTECUBITAL  Final   Special Requests BOTTLES DRAWN AEROBIC AND ANAEROBIC 5CC  Final   Culture NO GROWTH 3 DAYS  Final   Report Status PENDING  Incomplete  MRSA PCR Screening     Status: None   Collection Time: 07/14/15  8:27 PM   Result Value Ref Range Status   MRSA by PCR NEGATIVE NEGATIVE Final    Comment:        The GeneXpert MRSA Assay (FDA approved for NASAL specimens only), is one component of a comprehensive MRSA colonization surveillance program. It is not intended to diagnose MRSA infection nor to guide or monitor treatment for MRSA infections.   Culture, expectorated sputum-assessment     Status: None (Preliminary result)   Collection Time: 07/17/15  9:50 AM  Result Value Ref Range Status   Specimen Description SPU  Final   Special Requests NONE  Final   Sputum evaluation THIS SPECIMEN IS ACCEPTABLE FOR SPUTUM CULTURE  Final   Report Status PENDING  Incomplete  Culture, respiratory (NON-Expectorated)     Status: None (Preliminary result)   Collection Time: 07/17/15  9:50 AM  Result Value Ref Range Status   Specimen Description SPU  Final   Special Requests NONE Reflexed from [id number]  Final   Gram Stain PENDING  Incomplete   Culture LIGHT GROWTH YEAST IDENTIFICATION TO FOLLOW   Final   Report Status PENDING  Incomplete    Medications:  Scheduled:  . antiseptic oral rinse  7 mL Mouth Rinse BID  . aspirin  81 mg Oral Daily  .  famotidine  20 mg Oral Daily  . hydrocortisone sod succinate (SOLU-CORTEF) inj  50 mg Intravenous Q6H  . piperacillin-tazobactam (ZOSYN)  IV  3.375 g Intravenous 3 times per day  . potassium chloride  10 mEq Intravenous Q1 Hr x 4  . sodium chloride  3 mL Intravenous Q12H   Infusions:  . sodium chloride    . norepinephrine (LEVOPHED) Adult infusion 1 mcg/min (07/18/15 0500)    Assessment: Patient is hypokalemic with Potassium level of 2.7. Received KCl PO once this morning. Magnesium was checked on 10/1 and resulted within normal limits.  Goal of Therapy:  Maintain electrolytes in norma range.  Plan:  Will order KCl boluses of IV x 4. Will recheck potassium level and magnesium level tonight at 21:00. Will follow up on levels and order as  indicated.  Clovia Cuff, PharmD, BCPS 07/18/2015 12:38 PM

## 2015-07-18 NOTE — Progress Notes (Signed)
PHARMACY - CRITICAL CARE PROGRESS NOTE  Pharmacy Consult for Electrolyte Management Indication: hypokalemia   Allergies  Allergen Reactions  . Morphine And Related Diarrhea and Nausea And Vomiting    Patient Measurements: Height:  (165.1 cm) Weight: 110 lb 7.2 oz (50.1 kg) IBW/kg (Calculated) : 57 Adjusted Body Weight: n/a  Vital Signs: Temp: 98 F (36.7 C) (10/02 2134) Temp Source: Oral (10/02 2134) BP: 103/50 mmHg (10/02 2134) Pulse Rate: 92 (10/02 2134) Intake/Output from previous day: 10/01 0701 - 10/02 0700 In: 744.4 [I.V.:344.4; NG/GT:150; IV Piggyback:250] Out: 925 [Urine:925] Intake/Output from this shift: Total I/O In: -  Out: 200 [Urine:200] Vent settings for last 24 hours:    Labs:  Recent Labs  07/16/15 0510 07/16/15 1509 07/17/15 0430 07/18/15 0544 07/18/15 2152  WBC 13.4*  --  9.4 11.7*  --   HGB 8.4*  --  7.7* 7.2*  --   HCT 24.9*  --  22.5* 22.0*  --   PLT 162  --  116* 114*  --   INR 6.75* 1.70 1.87  --   --   CREATININE 1.05*  --  0.93 0.80  --   MG  --   --  1.9  --  1.9  PHOS  --   --  2.6  --   --    Estimated Creatinine Clearance: 51.8 mL/min (by C-G formula based on Cr of 0.8).  No results for input(s): GLUCAP in the last 72 hours.  Microbiology: Recent Results (from the past 720 hour(s))  Culture, blood (routine x 2)     Status: None (Preliminary result)   Collection Time: 07/14/15  6:53 AM  Result Value Ref Range Status   Specimen Description BLOOD RIGHT ANTECUBITAL  Final   Special Requests BOTTLES DRAWN AEROBIC AND ANAEROBIC 5CC  Final   Culture NO GROWTH 4 DAYS  Final   Report Status PENDING  Incomplete  Culture, blood (routine x 2)     Status: None (Preliminary result)   Collection Time: 07/14/15  6:53 AM  Result Value Ref Range Status   Specimen Description BLOOD RIGHT ANTECUBITAL  Final   Special Requests BOTTLES DRAWN AEROBIC AND ANAEROBIC 5CC  Final   Culture NO GROWTH 4 DAYS  Final   Report Status PENDING   Incomplete  MRSA PCR Screening     Status: None   Collection Time: 07/14/15  8:27 PM  Result Value Ref Range Status   MRSA by PCR NEGATIVE NEGATIVE Final    Comment:        The GeneXpert MRSA Assay (FDA approved for NASAL specimens only), is one component of a comprehensive MRSA colonization surveillance program. It is not intended to diagnose MRSA infection nor to guide or monitor treatment for MRSA infections.   Culture, expectorated sputum-assessment     Status: None (Preliminary result)   Collection Time: 07/17/15  9:50 AM  Result Value Ref Range Status   Specimen Description SPU  Final   Special Requests NONE  Final   Sputum evaluation THIS SPECIMEN IS ACCEPTABLE FOR SPUTUM CULTURE  Final   Report Status PENDING  Incomplete  Culture, respiratory (NON-Expectorated)     Status: None (Preliminary result)   Collection Time: 07/17/15  9:50 AM  Result Value Ref Range Status   Specimen Description SPU  Final   Special Requests NONE Reflexed from [id number]  Final   Gram Stain PENDING  Incomplete   Culture LIGHT GROWTH YEAST IDENTIFICATION TO FOLLOW   Final   Report  Status PENDING  Incomplete    Medications:  Scheduled:  . aspirin  81 mg Oral Daily  . famotidine  20 mg Oral Daily  . hydrocortisone sod succinate (SOLU-CORTEF) inj  50 mg Intravenous Q6H  . [START ON 07/19/2015] Influenza vac split quadrivalent PF  0.5 mL Intramuscular Tomorrow-1000  . piperacillin-tazobactam (ZOSYN)  IV  3.375 g Intravenous 3 times per day  . potassium chloride  10 mEq Intravenous Q1 Hr x 5  . sodium chloride  3 mL Intravenous Q12H   Infusions:  . sodium chloride      Assessment: Patient is hypokalemic with Potassium level of 2.7. Received KCl PO once this morning. Magnesium was checked on 10/1 and resulted within normal limits.  Goal of Therapy:  Maintain electrolytes in norma range.  Plan:  Will order KCl boluses of IV x 4. Will recheck potassium level and magnesium level  tonight at 21:00. Will follow up on levels and order as indicated.  1002 2152 potassium low, magnesium WNL. Ordered KCl 10 mEq IV x 5 doses and will recheck labs tomorrow morning.   Tra Wilemon A. [place], [place].D. Clinical Pharmacist  07/18/2015 10:46 PM

## 2015-07-18 NOTE — Progress Notes (Signed)
[facility] [place] at [facility]   PATIENT NAME: [name]    MR#:  [id number]  DATE OF BIRTH:  [birth year]  SUBJECTIVE:  Extubated 07/17/15. Doing well. Has phantom limb pain No other complaints  REVIEW OF SYSTEMS:  Pt is on MV DRUG ALLERGIES:   Allergies  Allergen Reactions  . Morphine And Related Diarrhea and Nausea And Vomiting    VITALS:  Blood pressure 110/55, pulse 80, temperature 98.6 F (37 C), temperature source Oral, resp. rate 10, height  (1.651 m), weight 50.1 kg (110 lb 7.2 oz), SpO2 91 %.  PHYSICAL EXAMINATION:  GENERAL:  69 y.o.-year-old patient lying in the bed with no acute distress. Intubated and on the vent EYES: Pupils equal, round, sluggishly reactive to light and accommodation. No scleral icterus.  HEENT: Head atraumatic, normocephalic. Oropharynx with ET tube NECK:  Supple, no jugular venous distention.  LUNGS: Diminished breath sounds on rt greater than left, no wheezing,or crepitation.  CARDIOVASCULAR: S1, S2 normal. No murmurs, rubs, or gallops.  ABDOMEN: Soft, nontender, nondistended. Bowel sounds present. No organomegaly or mass.  EXTREMITIES: bilateral amputation. Left AKA stump + with surgical dressing NEUROLOGIC: INTUBATED  PSYCHIATRIC: The patient is on MV  LABORATORY PANEL:   CBC  Recent Labs Lab 07/18/15 0544  WBC 11.7*  HGB 7.2*  HCT 22.0*  PLT 114*   ------------------------------------------------------------------------------------------------------------------  Chemistries   Recent Labs Lab 07/14/15 0410  07/17/15 0430  07/18/15 0544  NA 135  < > 138  --  138  K 3.4*  < > 2.8*  < > 2.7*  CL 99*  < > 99*  --  98*  CO2 14*  < > 31  --  31  GLUCOSE 195*  < > 108*  --  110*  BUN 71*  < > 22*  --  20  CREATININE 2.18*  < > 0.93  --  0.80  CALCIUM 8.6*  < > 6.9*  --  7.3*  MG  --   < > 1.9  --   --   AST 91*  --   --   --   --   ALT 30  --   --   --   --   ALKPHOS 110  --   --    --   --   BILITOT 0.8  --   --   --   --   < > = values in this interval not displayed. ------------------------------------------------------------------------------------------------------------------  Cardiac Enzymes No results for input(s): TROPONINI in the last 168 hours. ------------------------------------------------------------------------------------------------------------------  RADIOLOGY:  Dg Chest 1 View  07/17/2015   CLINICAL DATA:  hypoxia  EXAM: CHEST 1 VIEW  COMPARISON:  July 15, 2015  FINDINGS: Endotracheal tube tip is 2.7 cm above the carina. Central catheter tip is in the superior vena cava. Nasogastric tube tip and side port below the diaphragm. No pneumothorax. There is extensive interstitial opacity throughout the right lung. There are small pleural effusions bilaterally. There is consolidation in the left base. Heart is upper normal in size with pulmonary vascularity within normal limits. No adenopathy.  IMPRESSION: Tube and catheter positions without pneumothorax. Interstitial opacity throughout the right lung. The unilateral nature of this opacity is more suggestive of pneumonia than interstitial edema, although atypical interstitial edema/congestive heart failure is possible. Both entities may be present concurrently. There are small pleural effusions bilaterally. There is focal consolidation in the left base which enlarged part may well represent atelectasis. There may be  alveolar edema superimposed in this area. No new opacity appreciable. No change in cardiac silhouette.   Electronically Signed   By: Bretta Bang III M.D.   On: 07/17/2015 07:33    EKG:   Orders placed or performed in visit on 11/06/14  . EKG 12-Lead    ASSESSMENT AND PLAN:    69 year old female admitted for arterial thrombus of the LLE.  1. Septic shock his metabolic acidosis with PNA and prob LE infection  Pressors to titrate map greater than or equal to 65  Broad spectrum abx  coverage with Zosyn -d/ced Levaquin and vancomycin  -appreciate ID input -BC negative  2. Left lower extremity with severe ischemia , elevated CK  LLE was pulseless, dusky, and very painful from severe ischemia now s/p left AKA day 2 H/o stents to extremity  2. Severe anemia  Status post blood transfusion and hemoglobin is at 8.4  3. Acute  respiratory failure secondary to Pneumonia: community-acquired;   Patient is intubated  on September 28 extubated 10/1 Status post central line and arterial line Continue broad-spectrum antibiotic Zosyn  4. 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 Renal function significantly improved with the aggressive hydration  avoid nephrotoxic agents.  Appreciate nephrology recommendations   5. Tobacco abuse: Patient still continues to smoke.  -on Nicoderm patch  6. DVT pxs: heparin drip is discontinued in view of severe anemia  All the records are reviewed and case discussed with Care Management/Social Workerr. Management plans discussed with the  family and they are in agreement.  CODE STATUS:Full code (per husband)  TOTAL  TIME TAKING CARE OF THIS PATIENT: 35 minutes.    Arta Stump M.D on 07/18/2015   Between 7am to 6pm - Pager - [phone] After 6pm go to [email or web address] - password [email or web address]  [facility]  Office  [phone]  CC: Primary care physician; No primary care provider on file.

## 2015-07-18 NOTE — Progress Notes (Signed)
Spoke to Dr. Gilda Crease on the phone about drainage from patient's surgical site dressing (after reinforcement from night RN) and plans to move patient to the floor. MD okayed patient moving to any medical surgical floor and stated drainage expected and dressing to be changed tomorrow.

## 2015-07-18 NOTE — Progress Notes (Signed)
Per Dr. Nicholos Johns given verbal orders to transfer pt to any medsurg unit

## 2015-07-18 NOTE — Progress Notes (Signed)
Subjective:  Patient is doing fair Able to follow commands  S Cr remains good Extubated now. 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   Objective:  Vital signs in last 24 hours:  Temp:  [98.2 F (36.8 C)-98.8 F (37.1 C)] 98.6 F (37 C) (10/02 0445) Pulse Rate:  [49-129] 80 (10/02 0600) Resp:  [5-21] 10 (10/02 0600) BP: (80-121)/(46-90) 110/55 mmHg (10/02 0600) SpO2:  [87 %-100 %] 91 % (10/02 0600) Arterial Line BP: (92-141)/(45-71) 115/54 mmHg (10/02 0600)  Weight change:  Filed Weights   07/14/15 0820 07/16/15 0600 07/17/15 0436  Weight: 46.312 kg (102 lb 1.6 oz) 51.6 kg (113 lb 12.1 oz) 50.1 kg (110 lb 7.2 oz)    Intake/Output:    Intake/Output Summary (Last 24 hours) at 07/18/15 0912 Last data filed at 07/18/15 1610  Gross per 24 hour  Intake 467.69 ml  Output    925 ml  Net -457.31 ml     Physical Exam: General: Frail, elderly laying in bed  HEENT Anicteric,  Moist oral mucus membranes  Neck supple  Pulm/lungs Coarse bl  CVS/Heart No rub  Abdomen:  Soft, non tender  Extremities: b/l AKA  Neurologic: Alert, able to communicate  Skin:   Access:        Basic Metabolic Panel:   Recent Labs Lab 07/14/15 2358 07/15/15 0613 07/16/15 0510 07/17/15 0430 07/17/15 1356 07/18/15 0544  NA 144 144 138 138  --  138  K 4.4 4.1 3.2* 2.8* 3.3* 2.7*  CL 119* 111 101 99*  --  98*  CO2 19* --  31  GLUCOSE 124* 118* 122* 108*  --  110*  BUN 52* 49* 27* 22*  --  20  CREATININE 1.23* 1.08* 1.05* 0.93  --  0.80  CALCIUM 7.0* 6.9* 6.9* 6.9*  --  7.3*  MG 2.2  --   --  1.9  --   --   PHOS 5.1*  --   --  2.6  --   --      CBC:  Recent Labs Lab 07/14/15 2358 07/15/15 0613 07/15/15 1155 07/16/15 0510 07/17/15 0430 07/18/15 0544  WBC 15.2* 12.5*  --  13.4* 9.4 11.7*  NEUTROABS 13.2*  --   --   --   --   --   HGB 6.3* 7.6* 9.0* 8.4* 7.7* 7.2*  HCT 20.0* 23.2* 27.7* 24.9* 22.5* 22.0*  MCV 88.6 89.6  --  85.9 88.0 88.6  PLT 255 201  --  162  116* 114*      Microbiology:  Recent Results (from the past 720 hour(s))  Culture, blood (routine x 2)     Status: None (Preliminary result)   Collection Time: 07/14/15  6:53 AM  Result Value Ref Range Status   Specimen Description BLOOD RIGHT ANTECUBITAL  Final   Special Requests BOTTLES DRAWN AEROBIC AND ANAEROBIC 5CC  Final   Culture NO GROWTH 3 DAYS  Final   Report Status PENDING  Incomplete  Culture, blood (routine x 2)     Status: None (Preliminary result)   Collection Time: 07/14/15  6:53 AM  Result Value Ref Range Status   Specimen Description BLOOD RIGHT ANTECUBITAL  Final   Special Requests BOTTLES DRAWN AEROBIC AND ANAEROBIC 5CC  Final   Culture NO GROWTH 3 DAYS  Final   Report Status PENDING  Incomplete  MRSA PCR Screening     Status: None   Collection Time: 07/14/15  8:27 PM  Result Value  Ref Range Status   MRSA by PCR NEGATIVE NEGATIVE Final    Comment:        The GeneXpert MRSA Assay (FDA approved for NASAL specimens only), is one component of a comprehensive MRSA colonization surveillance program. It is not intended to diagnose MRSA infection nor to guide or monitor treatment for MRSA infections.   Culture, expectorated sputum-assessment     Status: None (Preliminary result)   Collection Time: 07/17/15  9:50 AM  Result Value Ref Range Status   Specimen Description SPU  Final   Special Requests NONE  Final   Sputum evaluation THIS SPECIMEN IS ACCEPTABLE FOR SPUTUM CULTURE  Final   Report Status PENDING  Incomplete  Culture, respiratory (NON-Expectorated)     Status: None (Preliminary result)   Collection Time: 07/17/15  9:50 AM  Result Value Ref Range Status   Specimen Description SPU  Final   Special Requests NONE Reflexed from [id number]  Final   Gram Stain PENDING  Incomplete   Culture LIGHT GROWTH YEAST IDENTIFICATION TO FOLLOW   Final   Report Status PENDING  Incomplete    Coagulation Studies:  Recent Labs  07/16/15 0510 07/16/15 1509  07/17/15 0430  LABPROT 58.2* 20.2* 21.7*  INR 6.75* 1.70 1.87    Urinalysis: No results for input(s): COLORURINE, LABSPEC, PHURINE, GLUCOSEU, HGBUR, BILIRUBINUR, KETONESUR, PROTEINUR, UROBILINOGEN, NITRITE, LEUKOCYTESUR in the last 72 hours.  Invalid input(s): APPERANCEUR    Imaging: Dg Chest 1 View  07/17/2015   CLINICAL DATA:  hypoxia  EXAM: CHEST 1 VIEW  COMPARISON:  July 15, 2015  FINDINGS: Endotracheal tube tip is 2.7 cm above the carina. Central catheter tip is in the superior vena cava. Nasogastric tube tip and side port below the diaphragm. No pneumothorax. There is extensive interstitial opacity throughout the right lung. There are small pleural effusions bilaterally. There is consolidation in the left base. Heart is upper normal in size with pulmonary vascularity within normal limits. No adenopathy.  IMPRESSION: Tube and catheter positions without pneumothorax. Interstitial opacity throughout the right lung. The unilateral nature of this opacity is more suggestive of pneumonia than interstitial edema, although atypical interstitial edema/congestive heart failure is possible. Both entities may be present concurrently. There are small pleural effusions bilaterally. There is focal consolidation in the left base which enlarged part may well represent atelectasis. There may be alveolar edema superimposed in this area. No new opacity appreciable. No change in cardiac silhouette.   Electronically Signed   By: Bretta Bang III M.D.   On: 07/17/2015 07:33     Medications:   . sodium chloride    . norepinephrine (LEVOPHED) Adult infusion 1 mcg/min (07/18/15 0500)   . antiseptic oral rinse  7 mL Mouth Rinse BID  . aspirin  81 mg Oral Daily  . famotidine  20 mg Oral Daily  . hydrocortisone sod succinate (SOLU-CORTEF) inj  50 mg Intravenous Q6H  . piperacillin-tazobactam (ZOSYN)  IV  3.375 g Intravenous 3 times per day  . sodium chloride  3 mL Intravenous Q12H   Place/Maintain  arterial line **AND** sodium chloride, HYDROmorphone (DILAUDID) injection, HYDROmorphone, magnesium hydroxide, ondansetron **OR** ondansetron (ZOFRAN) IV, oxyCODONE-acetaminophen  Assessment/ Plan:  69 y.o. female with a PMHX of severe peripheral vascular disease, was admitted on 07/14/2015 with limb threatening ischemia.  1. Acute renal failure, likely ATN and volume depletion. Serum creatinine is at baseline Supportive care at present 2. acute respiratory failure. Right lung pneumonia noted on chest x-ray  3. Acute on chronic left  lower extremity ischemia. 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

## 2015-07-18 NOTE — Progress Notes (Signed)
Gave report to Annabelle, RN on 2C. Patient to go to room 203. Patient and family at bedside aware of transfer.

## 2015-07-18 NOTE — Progress Notes (Signed)
Spoke with Dr. Betti Cruz about Pt's critical potassium of 2.7. Orders to replace with 20 mEq PO and to recheck later. Will continue to monitor

## 2015-07-19 LAB — BASIC METABOLIC PANEL
Anion gap: 4 — ABNORMAL LOW (ref 5–15)
BUN: 18 mg/dL (ref 6–20)
CO2: 30 mmol/L (ref 22–32)
Calcium: 7.3 mg/dL — ABNORMAL LOW (ref 8.9–10.3)
Chloride: 103 mmol/L (ref 101–111)
Creatinine, Ser: 0.74 mg/dL (ref 0.44–1.00)
GFR calc Af Amer: >60 mL/min (ref >60)
GFR calc non Af Amer: >60 mL/min (ref >60)
Glucose, Bld: 102 mg/dL — ABNORMAL HIGH (ref 65–99)
Potassium: 3.6 mmol/L (ref 3.5–5.1)
Sodium: 137 mmol/L (ref 135–145)

## 2015-07-19 LAB — CULTURE, BLOOD (ROUTINE X 2)
Culture: NO GROWTH
Culture: NO GROWTH

## 2015-07-19 LAB — POTASSIUM: Potassium: 3 mmol/L — ABNORMAL LOW (ref 3.5–5.1)

## 2015-07-19 LAB — MYOGLOBIN, SERUM: Myoglobin: 12745 ng/mL — ABNORMAL HIGH (ref 25–58)

## 2015-07-19 LAB — PHOSPHORUS
Phosphorus: 1.9 mg/dL — ABNORMAL LOW (ref 2.5–4.6)
Phosphorus: 2.3 mg/dL — ABNORMAL LOW (ref 2.5–4.6)

## 2015-07-19 LAB — EXPECTORATED SPUTUM ASSESSMENT W GRAM STAIN, RFLX TO RESP C

## 2015-07-19 LAB — MAGNESIUM: Magnesium: 2 mg/dL (ref 1.7–2.4)

## 2015-07-19 LAB — EXPECTORATED SPUTUM ASSESSMENT W REFEX TO RESP CULTURE

## 2015-07-19 MED ORDER — POTASSIUM PHOSPHATES 15 MMOLE/5ML IV SOLN
15.0000 mmol | Freq: Once | INTRAVENOUS | Status: AC
  Administered 2015-07-19: 15 mmol via INTRAVENOUS
  Filled 2015-07-19: qty 5

## 2015-07-19 MED ORDER — BISACODYL 5 MG PO TBEC
5.0000 mg | DELAYED_RELEASE_TABLET | Freq: Every day | ORAL | Status: DC
  Administered 2015-07-19 – 2015-07-20 (×2): 5 mg via ORAL
  Filled 2015-07-19 (×3): qty 1

## 2015-07-19 MED ORDER — SENNOSIDES-DOCUSATE SODIUM 8.6-50 MG PO TABS
1.0000 | ORAL_TABLET | Freq: Two times a day (BID) | ORAL | Status: DC
  Administered 2015-07-19 – 2015-07-25 (×12): 1 via ORAL
  Filled 2015-07-19 (×13): qty 1

## 2015-07-19 MED ORDER — OXYCODONE-ACETAMINOPHEN 5-325 MG PO TABS
1.0000 | ORAL_TABLET | Freq: Four times a day (QID) | ORAL | Status: DC | PRN
  Administered 2015-07-19 – 2015-07-20 (×5): 1 via ORAL
  Administered 2015-07-21 – 2015-07-23 (×7): 2 via ORAL
  Filled 2015-07-19: qty 2
  Filled 2015-07-19: qty 1
  Filled 2015-07-19 (×3): qty 2
  Filled 2015-07-19 (×2): qty 1
  Filled 2015-07-19 (×3): qty 2
  Filled 2015-07-19 (×2): qty 1

## 2015-07-19 MED ORDER — GUAIFENESIN ER 600 MG PO TB12
600.0000 mg | ORAL_TABLET | Freq: Two times a day (BID) | ORAL | Status: DC
  Administered 2015-07-19 – 2015-07-25 (×14): 600 mg via ORAL
  Filled 2015-07-19 (×14): qty 1

## 2015-07-19 NOTE — Progress Notes (Addendum)
[facility] - [place] at [facility]   PATIENT NAME: [name]    MR#:  [id number]  DATE OF BIRTH:  01-28-1946  SUBJECTIVE:  C/o phantom limb pain  REVIEW OF SYSTEMS:   Review of Systems  Constitutional: Negative for fever, chills and weight loss.  HENT: Negative for ear discharge, ear pain and nosebleeds.   Eyes: Negative for blurred vision, pain and discharge.  Respiratory: Negative for cough, sputum production, wheezing and stridor.   Cardiovascular: Negative for chest pain, palpitations, orthopnea and PND.  Gastrointestinal: Negative for nausea, vomiting, abdominal pain and diarrhea.  Genitourinary: Negative for urgency and frequency.  Musculoskeletal: Positive for joint pain. Negative for back pain.  Neurological: Negative for sensory change, speech change, focal weakness and weakness.  Psychiatric/Behavioral: Negative for depression and hallucinations. The patient is not nervous/anxious.   All other systems reviewed and are negative.  Tolerating Diet:yes Tolerating PT: eval pending  DRUG ALLERGIES:   Allergies  Allergen Reactions  . Morphine And Related Diarrhea and Nausea And Vomiting    VITALS:  Blood pressure 118/54, pulse 125, temperature 98.2 F (36.8 C), temperature source Oral, resp. rate 20, height  (1.651 m), weight 55.792 kg (123 lb), SpO2 72 %.  PHYSICAL EXAMINATION:   Physical Exam  GENERAL:  69 y.o.-year-old patient lying in the bed with no acute distress.  EYES: Pupils equal, round, reactive to light and accommodation. No scleral icterus. Extraocular muscles intact.  HEENT: Head atraumatic, normocephalic. Oropharynx and nasopharynx clear.  NECK:  Supple, no jugular venous distention. No thyroid enlargement, no tenderness.  LUNGS: Normal breath sounds bilaterally, no wheezing, rales, rhonchi. No use of accessory muscles of respiration.  CARDIOVASCULAR: S1, S2 normal. No murmurs, rubs, or gallops.  ABDOMEN: Soft,  nontender, nondistended. Bowel sounds present. No organomegaly or mass.  EXTREMITIES: No cyanosis, clubbing or edema b/l.    NEUROLOGIC: Cranial nerves II through XII are intact. No focal Motor or sensory deficits b/l.   PSYCHIATRIC: The patient is alert and oriented x 3.  SKIN: No obvious rash, lesion, or ulcer.    LABORATORY PANEL:   CBC  Recent Labs Lab 07/18/15 0544  WBC 11.7*  HGB 7.2*  HCT 22.0*  PLT 114*    Chemistries   Recent Labs Lab 07/14/15 0410  07/19/15 0520 07/19/15 2001  NA 135  < > 137  --   K 3.4*  < > 3.6 3.0*  CL 99*  < > 103  --   CO2 14*  < > 30  --   GLUCOSE 195*  < > 102*  --   BUN 71*  < > 18  --   CREATININE 2.18*  < > 0.74  --   CALCIUM 8.6*  < > 7.3*  --   MG  --   < > 2.0  --   AST 91*  --   --   --   ALT 30  --   --   --   ALKPHOS 110  --   --   --   BILITOT 0.8  --   --   --   < > = values in this interval not displayed.  Cardiac Enzymes No results for input(s): TROPONINI in the last 168 hours.  RADIOLOGY:  No results found.   ASSESSMENT AND PLAN:   69 year old female admitted for arterial thrombus of the LLE.  1. Septic shock his metabolic acidosis with PNA and prob LE infection  -IV zosyn--->will  transition to po augementin -d/ced Levaquin and vancomycin  -appreciate ID input -BC negative  2. Left lower extremity with severe ischemia , elevated CK  LLE was pulseless, dusky, and very painful from severe ischemia now s/p left AKA day 3 H/o stents to extremity -phantom limb pain--->prn percocet -angiogram per Vascular in 1-2 days  2. Severe anemia  Status post blood transfusion and hemoglobin is at 8.4  3. Acute  respiratory failure secondary to Pneumonia: community-acquired;   Patient is intubated  on September 28 extubated 10/1 Status post central line and arterial line Continue broad-spectrum antibiotic Zosyn  4. Acute renal failure due to ATN  Renal function significantly improved with the aggressive  hydration.  DC IV fluids avoid nephrotoxic agents.  Appreciate nephrology recommendations   5. Tobacco abuse: Patient still continues to smoke.  -on Nicoderm patch  6. DVT pxs: heparin drip is discontinued in view of severe anemia  Case discussed with Care Management/Social Worker. Management plans discussed with the patient, family and they are in agreement.  CODE STATUS: full  DVT Prophylaxi: no antiplt agents in view of anemia  TOTAL TIME TAKING CARE OF THIS PATIENT: 30 minutes.  >50% time spent on counselling and coordination of care pt, family  P  Anelis Hrivnak M.D on 07/19/2015 at 10:05 PM  Between 7am to 6pm - Pager - [phone]  After 6pm go to [email or web address] - password [email or web address] [facility]  [facility]  Office  [phone]  CC: Primary care physician; No primary care provider on file.

## 2015-07-19 NOTE — Progress Notes (Signed)
PHARMACY - CRITICAL CARE PROGRESS NOTE  Pharmacy Consult for Electrolyte Management Indication: hypokalemia   Allergies  Allergen Reactions  . Morphine And Related Diarrhea and Nausea And Vomiting    Patient Measurements: Height:  (165.1 cm) Weight: 123 lb (55.792 kg) IBW/kg (Calculated) : 57 Adjusted Body Weight: n/a  Vital Signs: Temp: 97.7 F (36.5 C) (10/03 0555) Temp Source: Oral (10/03 0555) BP: 132/66 mmHg (10/03 0555) Pulse Rate: 99 (10/03 0555) Intake/Output from previous day: 10/02 0701 - 10/03 0700 In: 1755 [P.O.:960; I.V.:95; IV Piggyback:700] Out: 1450 [Urine:1450] Intake/Output from this shift:   Vent settings for last 24 hours:    Labs:  Recent Labs  07/16/15 1509 07/17/15 0430 07/18/15 0544 07/18/15 2152 07/19/15 0520  WBC  --  9.4 11.7*  --   --   HGB  --  7.7* 7.2*  --   --   HCT  --  22.5* 22.0*  --   --   PLT  --  116* 114*  --   --   INR 1.70 1.87  --   --   --   CREATININE  --  0.93 0.80  --  0.74  MG  --  1.9  --  1.9 2.0  PHOS  --  2.6  --   --  1.9*   Estimated Creatinine Clearance: 57.6 mL/min (by C-G formula based on Cr of 0.74).  No results for input(s): GLUCAP in the last 72 hours.  Microbiology: Recent Results (from the past 720 hour(s))  Culture, blood (routine x 2)     Status: None (Preliminary result)   Collection Time: 07/14/15  6:53 AM  Result Value Ref Range Status   Specimen Description BLOOD RIGHT ANTECUBITAL  Final   Special Requests BOTTLES DRAWN AEROBIC AND ANAEROBIC 5CC  Final   Culture NO GROWTH 4 DAYS  Final   Report Status PENDING  Incomplete  Culture, blood (routine x 2)     Status: None (Preliminary result)   Collection Time: 07/14/15  6:53 AM  Result Value Ref Range Status   Specimen Description BLOOD RIGHT ANTECUBITAL  Final   Special Requests BOTTLES DRAWN AEROBIC AND ANAEROBIC 5CC  Final   Culture NO GROWTH 4 DAYS  Final   Report Status PENDING  Incomplete  MRSA PCR Screening     Status: None    Collection Time: 07/14/15  8:27 PM  Result Value Ref Range Status   MRSA by PCR NEGATIVE NEGATIVE Final    Comment:        The GeneXpert MRSA Assay (FDA approved for NASAL specimens only), is one component of a comprehensive MRSA colonization surveillance program. It is not intended to diagnose MRSA infection nor to guide or monitor treatment for MRSA infections.   Culture, expectorated sputum-assessment     Status: None (Preliminary result)   Collection Time: 07/17/15  9:50 AM  Result Value Ref Range Status   Specimen Description SPU  Final   Special Requests NONE  Final   Sputum evaluation THIS SPECIMEN IS ACCEPTABLE FOR SPUTUM CULTURE  Final   Report Status PENDING  Incomplete  Culture, respiratory (NON-Expectorated)     Status: None (Preliminary result)   Collection Time: 07/17/15  9:50 AM  Result Value Ref Range Status   Specimen Description SPU  Final   Special Requests NONE Reflexed from [id number]  Final   Gram Stain PENDING  Incomplete   Culture LIGHT GROWTH YEAST IDENTIFICATION TO FOLLOW   Final   Report Status PENDING  Incomplete    Medications:  Scheduled:  . aspirin  81 mg Oral Daily  . famotidine  20 mg Oral Daily  . hydrocortisone sod succinate (SOLU-CORTEF) inj  50 mg Intravenous Q6H  . Influenza vac split quadrivalent PF  0.5 mL Intramuscular Tomorrow-1000  . piperacillin-tazobactam (ZOSYN)  IV  3.375 g Intravenous 3 times per day  . sodium chloride  3 mL Intravenous Q12H   Infusions:  . sodium chloride 20 mL/hr at 07/19/15 0358    Assessment: Patient is hypokalemic with Potassium level of 2.7. Received KCl PO once this morning. Magnesium was checked on 10/1 and resulted within normal limits.  Goal of Therapy:  Maintain electrolytes in norma range.  Plan:  Will order KCl boluses of IV x 4. Will recheck potassium level and magnesium level tonight at 21:00. Will follow up on levels and order as indicated.  1002 2152 potassium low,  magnesium WNL. Ordered KCl 10 mEq IV x 5 doses and will recheck labs tomorrow morning.   1003 0520 K=3.6 Mg=2.0 Phos=1.9  K and Mg are WNL, phos is low. Will give of Potassium phosphate once. Will recheck level a few hours after infusion.   Olene Floss, Pharm.D Clinical Pharmacist   07/19/2015 7:50 AM

## 2015-07-19 NOTE — Care Management Important Message (Signed)
Important Message  Patient Details  Name: GESENIA BANTZ MRN: [id number] Date of Birth: 1946/05/07   Medicare Important Message Given:  Yes-third notification given    Adonis Huguenin, RN 07/19/2015, 10:32 AM

## 2015-07-19 NOTE — Progress Notes (Signed)
PT Cancellation Note  Patient Details Name: MAYLEEN BORRERO MRN: [id number] DOB: [birth year]   Cancelled Treatment:    Reason Eval/Treat Not Completed: Medical issues which prohibited therapy (Per nurse, pt unable to be seen due to low hemoglobin/hematocrit and possible further surgery on LLE. Awaiting further medical evaluation. )   Georgina Peer 07/19/2015, 1:48 PM

## 2015-07-19 NOTE — Care Management (Signed)
Spoke with patient for discharge planning. Patient is alert and oriented and lives at home with husband. She has a wheelchair and prosthetic for her right AKA. Also has walker and tub bench. Patient would like DME request for transfer board. I will discuss with DME provider on availability. Patient stated that her husband is able to drive her to appointments.PT to evaluate this day. Patient will likely need home health. She stated that she has had  [facility] prior and would like to stay with their services. No other CM needs identified. Continue to follow.

## 2015-07-19 NOTE — Progress Notes (Signed)
[facility]  Daily Progress Note   Subjective  - 3 Days Post-Op  Having phantom pain.  Otherwise feels well.  Stump draining some  Objective Filed Vitals:   07/19/15 0500 07/19/15 0555 07/19/15 0848 07/19/15 1249  BP:  132/66 142/70 107/61  Pulse:  99 100 120  Temp:  97.7 F (36.5 C) 98.1 F (36.7 C) 98 F (36.7 C)  TempSrc:  Oral Oral Oral  Resp:  Height:      Weight: 55.792 kg (123 lb)     SpO2:  90% 94% 92%    Intake/Output Summary (Last 24 hours) at 07/19/15 1555 Last data filed at 07/19/15 1300  Gross per 24 hour  Intake 1174.33 ml  Output   1601 ml  Net -426.67 ml    PULM  CTAB CV  RRR VASC  Dressing with mild serosanguinous drainage  Laboratory CBC    Component Value Date/Time   WBC 11.7* 07/18/2015 0544   WBC 10.4 02/12/2015 1641   HGB 7.2* 07/18/2015 0544   HGB 8.6* 02/12/2015 1641   HCT 22.0* 07/18/2015 0544   HCT 25.9* 02/12/2015 1641   PLT 114* 07/18/2015 0544   PLT 343 02/12/2015 1641    BMET    Component Value Date/Time   NA 137 07/19/2015 0520   NA 131* 02/13/2015 0426   K 3.6 07/19/2015 0520   K 3.0* 02/13/2015 0426   CL 103 07/19/2015 0520   CL 103 02/13/2015 0426   CO2 30 07/19/2015 0520   CO2 23 02/13/2015 0426   GLUCOSE 102* 07/19/2015 0520   GLUCOSE 104* 02/13/2015 0426   BUN 18 07/19/2015 0520   BUN 10 02/13/2015 0426   CREATININE 0.74 07/19/2015 0520   CREATININE 0.83 02/13/2015 0426   CALCIUM 7.3* 07/19/2015 0520   CALCIUM 7.3* 02/13/2015 0426   GFRNONAA >60 07/19/2015 0520   GFRNONAA >60 02/13/2015 0426   GFRNONAA 51* 11/08/2014 0618   GFRAA >60 07/19/2015 0520   GFRAA >60 02/13/2015 0426   GFRAA >60 11/08/2014 0618    Assessment/Planning: POD #3 s/p open amputation for ischemia and muscle breakdown    Will plan angiogram tomorrow or Wednesday as the schedule allows as her perfusion is likely not good enough for AKA to heal  After angiogram, will plan closure of amputation  later in the week    DEW,JASON  07/19/2015, 3:55 PM

## 2015-07-20 ENCOUNTER — Inpatient Hospital Stay: Payer: Medicare Other

## 2015-07-20 ENCOUNTER — Inpatient Hospital Stay (HOSPITAL_COMMUNITY)
Admit: 2015-07-20 | Discharge: 2015-07-20 | Disposition: A | Discharge status: Home / Self Care | Payer: Medicare Other | Attending: Internal Medicine | Admitting: Internal Medicine

## 2015-07-20 ENCOUNTER — Ambulatory Visit: Payer: Medicare Other | Admitting: Rehabilitation

## 2015-07-20 DIAGNOSIS — I340 Nonrheumatic mitral (valve) insufficiency: Secondary | ICD-10-CM

## 2015-07-20 LAB — BASIC METABOLIC PANEL
Anion gap: 8 (ref 5–15)
BUN: 18 mg/dL (ref 6–20)
CO2: 28 mmol/L (ref 22–32)
Calcium: 7.6 mg/dL — ABNORMAL LOW (ref 8.9–10.3)
Chloride: 102 mmol/L (ref 101–111)
Creatinine, Ser: 0.83 mg/dL (ref 0.44–1.00)
GFR calc Af Amer: >60 mL/min (ref >60)
GFR calc non Af Amer: >60 mL/min (ref >60)
Glucose, Bld: 113 mg/dL — ABNORMAL HIGH (ref 65–99)
Potassium: 3.4 mmol/L — ABNORMAL LOW (ref 3.5–5.1)
Sodium: 138 mmol/L (ref 135–145)

## 2015-07-20 LAB — POTASSIUM: Potassium: 3.1 mmol/L — ABNORMAL LOW (ref 3.5–5.1)

## 2015-07-20 MED ORDER — METHYLPREDNISOLONE SODIUM SUCC 125 MG IJ SOLR
125.0000 mg | INTRAMUSCULAR | Status: AC
  Administered 2015-07-20: 125 mg via INTRAVENOUS
  Filled 2015-07-20: qty 2

## 2015-07-20 MED ORDER — IPRATROPIUM-ALBUTEROL 0.5-2.5 (3) MG/3ML IN SOLN
RESPIRATORY_TRACT | Status: AC
  Administered 2015-07-20: 11:00:00
  Filled 2015-07-20: qty 3

## 2015-07-20 MED ORDER — POTASSIUM CHLORIDE CRYS ER 20 MEQ PO TBCR
40.0000 meq | EXTENDED_RELEASE_TABLET | Freq: Once | ORAL | Status: AC
  Administered 2015-07-20: 40 meq via ORAL
  Filled 2015-07-20: qty 2

## 2015-07-20 MED ORDER — POTASSIUM CHLORIDE CRYS ER 20 MEQ PO TBCR
40.0000 meq | EXTENDED_RELEASE_TABLET | Freq: Two times a day (BID) | ORAL | Status: DC
  Filled 2015-07-20: qty 2

## 2015-07-20 MED ORDER — ENSURE ENLIVE PO LIQD
237.0000 mL | Freq: Two times a day (BID) | ORAL | Status: DC
  Administered 2015-07-20 – 2015-07-25 (×9): 237 mL via ORAL

## 2015-07-20 MED ORDER — IPRATROPIUM-ALBUTEROL 0.5-2.5 (3) MG/3ML IN SOLN
3.0000 mL | RESPIRATORY_TRACT | Status: DC | PRN
  Administered 2015-07-20 – 2015-07-21 (×2): 3 mL via RESPIRATORY_TRACT
  Filled 2015-07-20 (×2): qty 3

## 2015-07-20 MED ORDER — LEVOFLOXACIN IN D5W 750 MG/150ML IV SOLN
750.0000 mg | INTRAVENOUS | Status: DC
  Administered 2015-07-20 – 2015-07-23 (×3): 750 mg via INTRAVENOUS
  Filled 2015-07-20 (×5): qty 150

## 2015-07-20 MED ORDER — FUROSEMIDE 10 MG/ML IJ SOLN
20.0000 mg | Freq: Two times a day (BID) | INTRAMUSCULAR | Status: DC
  Administered 2015-07-20 – 2015-07-22 (×5): 20 mg via INTRAVENOUS
  Filled 2015-07-20 (×5): qty 2

## 2015-07-20 MED ORDER — K PHOS MONO-SOD PHOS DI & MONO 155-852-130 MG PO TABS
250.0000 mg | ORAL_TABLET | Freq: Four times a day (QID) | ORAL | Status: AC
Start: 2015-07-20 — End: 2015-07-21
  Administered 2015-07-20 – 2015-07-21 (×8): 250 mg via ORAL
  Filled 2015-07-20 (×11): qty 1

## 2015-07-20 MED ORDER — FUROSEMIDE 10 MG/ML IJ SOLN
20.0000 mg | INTRAMUSCULAR | Status: AC
  Administered 2015-07-20: 20 mg via INTRAVENOUS
  Filled 2015-07-20: qty 2

## 2015-07-20 MED ORDER — POTASSIUM CHLORIDE 10 MEQ/100ML IV SOLN
10.0000 meq | INTRAVENOUS | Status: AC
  Administered 2015-07-20 (×4): 10 meq via INTRAVENOUS
  Filled 2015-07-20 (×4): qty 100

## 2015-07-20 MED ORDER — IPRATROPIUM BROMIDE 0.02 % IN SOLN
RESPIRATORY_TRACT | Status: AC
  Filled 2015-07-20: qty 2.5

## 2015-07-20 NOTE — Progress Notes (Signed)
[facility] - [place] [facility]   PATIENT NAME: [name]    MR#:  [id number]  DATE OF BIRTH:  [birth year]  SUBJECTIVE:  Complains of exertional shortness of breath this morning. Patient currently getting breathing treatment. Denies chest pain. Mild cough. REVIEW OF SYSTEMS:   Review of Systems  Constitutional: Negative for fever, chills and weight loss.  HENT: Negative for ear discharge, ear pain and nosebleeds.   Eyes: Negative for blurred vision, pain and discharge.  Respiratory: Positive for cough and shortness of breath. Negative for sputum production, wheezing and stridor.   Cardiovascular: Negative for chest pain, palpitations, orthopnea and PND.  Gastrointestinal: Negative for nausea, vomiting, abdominal pain and diarrhea.  Genitourinary: Negative for urgency and frequency.  Musculoskeletal: Positive for joint pain. Negative for back pain.  Neurological: Negative for sensory change, speech change, focal weakness and weakness.  Psychiatric/Behavioral: Negative for depression and hallucinations. The patient is not nervous/anxious.   All other systems reviewed and are negative.  Tolerating Diet:yes Tolerating PT: eval pending  DRUG ALLERGIES:   Allergies  Allergen Reactions  . Morphine And Related Diarrhea and Nausea And Vomiting    VITALS:  Blood pressure 129/72, pulse 113, temperature 97.9 F (36.6 C), temperature source Oral, resp. rate 18, height  (1.651 m), weight 55.883 kg (123 lb 3.2 oz), SpO2 100 %.  PHYSICAL EXAMINATION:   Physical Exam  GENERAL:  69 y.o.-year-old patient lying in the bed with   mild acute respiratory  distress.  EYES: Pupils equal, round, reactive to light and accommodation. No scleral icterus. Extraocular muscles intact.  HEENT: Head atraumatic, normocephalic. Oropharynx and nasopharynx clear.  NECK:  Supple, no jugular venous distention. No thyroid enlargement, no tenderness.  LUNGS: Decrease breath  sounds bilaterally, no wheezing,++ rales, no rhonchi. No use of accessory muscles of respiration.  CARDIOVASCULAR: S1, S2 normal. No murmurs, rubs, or gallops.  ABDOMEN: Soft, nontender, nondistended. Bowel sounds present. No organomegaly or mass.  EXTREMITIES: No cyanosis, clubbing or edema b/l.    NEUROLOGIC: Cranial nerves II through XII are intact. No focal Motor or sensory deficits b/l.   PSYCHIATRIC: The patient is alert and oriented x 3.  anxious SKIN: No obvious rash, lesion, or ulcer.    LABORATORY PANEL:   CBC  Recent Labs Lab 07/18/15 0544  WBC 11.7*  HGB 7.2*  HCT 22.0*  PLT 114*    Chemistries   Recent Labs Lab 07/14/15 0410  07/19/15 0520 07/19/15 2001  NA 135  < > 137  --   K 3.4*  < > 3.6 3.0*  CL 99*  < > 103  --   CO2 14*  < > 30  --   GLUCOSE 195*  < > 102*  --   BUN 71*  < > 18  --   CREATININE 2.18*  < > 0.74  --   CALCIUM 8.6*  < > 7.3*  --   MG  --   < > 2.0  --   AST 91*  --   --   --   ALT 30  --   --   --   ALKPHOS 110  --   --   --   BILITOT 0.8  --   --   --   < > = values in this interval not displayed.  Cardiac Enzymes No results for input(s): [facility] the last 168 hours.  RADIOLOGY:  Dg Chest Port 1 View  07/20/2015   CLINICAL  DATA:  Shortness of Breath  EXAM: PORTABLE CHEST 1 VIEW  COMPARISON:  July 17, 2015  FINDINGS: Endotracheal tube and nasogastric tube have been removed. Central catheter tip is in the superior vena cava. No pneumothorax. There is generalized interstitial edema with bilateral effusions, larger on the right than on the left. There is bibasilar atelectasis. Heart is upper normal in size with pulmonary vascularity within normal limits. No adenopathy.  IMPRESSION: Central catheter tip in superior vena cava. No pneumothorax. Evidence of a degree of congestive heart failure. Effusion is increased in size on the right compared to recent prior study.   Electronically Signed   By: Bretta Bang III M.D.   On:  07/20/2015 11:10     ASSESSMENT AND PLAN:   69 year old female admitted for arterial thrombus of the LLE.  1. Septic shock his metabolic acidosis with PNA and prob LE infection  -IVLevaquin-appreciate ID input -BC negative  2. Left lower extremity with severe ischemia , elevated CK  LLE was pulseless, dusky, and very painful from severe ischemia now s/p left AKA day 3 H/o stents to extremity -phantom limb pain--->prn percocet -angiogram per Vascular in 1-2 days  2. Severe anemia appears acute on chronic Status post blood transfusion and hemoglobin is at 8.4  3. Acute  respiratory failure secondary to Pneumonia: community-acquired;  and possible congestive heart failure with right sided pleural effusion Patient was  intubated  on September 28 extubated 10/1 Continue broad-spectrum anLevaquin chest x-ray October 4 showed increasing edema and right pleural effusion.  - IV Lasix bid , IV Solu-Medrol 125 mg once, continue IV Levaquin,I and O's - check echo of the heart.   4. Acute renal fialure likely ATN  - improved. Creat 0.7 Renal function significantly improved with the aggressive hydration  avoid nephrotoxic agents.  Appreciate nephrology recommendations   5. Tobacco abuse: Patient still continues to smoke.  -on Nicoderm patch  6. DVT pxs: heparin drip is discontinued in view of severe anemia  Case discussed with Care Management/Social Worker. Management plans discussed with the patient, family and they are in agreement.  CODE STATUS: full  DVT Prophylaxi: no antiplt agents in view of anemia  TOTAL TIME TAKING CARE OF THIS PATIENT: 30 minutes.  >50% time spent on counselling and coordination of care pt, family  P  Sharifa Bucholz M.D on 07/20/2015 at 12:47 PM  Between 7am to 6pm - Pager - [phone]  After 6pm go to [email or web address] - password [email or web address] [facility]  [facility]  Office  [phone]  CC: Primary care physician; No primary care provider on  file.

## 2015-07-20 NOTE — Progress Notes (Signed)
PHARMACY - CRITICAL CARE PROGRESS NOTE  Pharmacy Consult for Electrolyte Management Indication: hypokalemia   Allergies  Allergen Reactions  . Morphine And Related Diarrhea and Nausea And Vomiting    Patient Measurements: Height:  (165.1 cm) Weight: 123 lb 3.2 oz (55.883 kg) IBW/kg (Calculated) : 57 Adjusted Body Weight: n/a  Vital Signs: Temp: 97.7 F (36.5 C) (10/04 1415) Temp Source: Oral (10/04 1415) BP: 102/45 mmHg (10/04 1415) Pulse Rate: 102 (10/04 1415) Intake/Output from previous day: 10/03 0701 - 10/04 0700 In: 1236 [P.O.:420; I.V.:666; IV Piggyback:150] Out: 1201 [Urine:1200; Stool:1] Intake/Output from this shift: Total I/O In: 478.3 [I.V.:478.3] Out: 300 [Urine:300] Vent settings for last 24 hours:    Labs:  Recent Labs  07/18/15 0544 07/18/15 2152 07/19/15 0520 07/19/15 2001 07/20/15 1326  WBC 11.7*  --   --   --   --   HGB 7.2*  --   --   --   --   HCT 22.0*  --   --   --   --   PLT 114*  --   --   --   --   CREATININE 0.80  --  0.74  --  0.83  MG  --  1.9 2.0  --   --   PHOS  --   --  1.9* 2.3*  --    Estimated Creatinine Clearance: 55.7 mL/min (by C-G formula based on Cr of 0.83).  No results for input(s): GLUCAP in the last 72 hours.  Microbiology: Recent Results (from the past 720 hour(s))  Culture, blood (routine x 2)     Status: None   Collection Time: 07/14/15  6:53 AM  Result Value Ref Range Status   Specimen Description BLOOD RIGHT ANTECUBITAL  Final   Special Requests BOTTLES DRAWN AEROBIC AND ANAEROBIC 5CC  Final   Culture NO GROWTH 5 DAYS  Final   Report Status 07/19/2015 FINAL  Final  Culture, blood (routine x 2)     Status: None   Collection Time: 07/14/15  6:53 AM  Result Value Ref Range Status   Specimen Description BLOOD RIGHT ANTECUBITAL  Final   Special Requests BOTTLES DRAWN AEROBIC AND ANAEROBIC 5CC  Final   Culture NO GROWTH 5 DAYS  Final   Report Status 07/19/2015 FINAL  Final  MRSA PCR Screening      Status: None   Collection Time: 07/14/15  8:27 PM  Result Value Ref Range Status   MRSA by PCR NEGATIVE NEGATIVE Final    Comment:        The GeneXpert MRSA Assay (FDA approved for NASAL specimens only), is one component of a comprehensive MRSA colonization surveillance program. It is not intended to diagnose MRSA infection nor to guide or monitor treatment for MRSA infections.   Culture, expectorated sputum-assessment     Status: None   Collection Time: 07/17/15  9:50 AM  Result Value Ref Range Status   Specimen Description SPU  Final   Special Requests NONE  Final   Sputum evaluation THIS SPECIMEN IS ACCEPTABLE FOR SPUTUM CULTURE  Final   Report Status 07/19/2015 FINAL  Final  Culture, respiratory (NON-Expectorated)     Status: None (Preliminary result)   Collection Time: 07/17/15  9:50 AM  Result Value Ref Range Status   Specimen Description SPU  Final   Special Requests NONE Reflexed from [id number]  Final   Gram Stain   Final    POOR SPECIMEN - LESS THAN 70% WBCS RARE WBC SEEN RARE  GRAM POSITIVE COCCI    Culture HEAVY GROWTH YEAST IDENTIFICATION TO FOLLOW   Final   Report Status PENDING  Incomplete    Medications:  Scheduled:  . aspirin  81 mg Oral Daily  . bisacodyl  5 mg Oral Daily  . famotidine  20 mg Oral Daily  . feeding supplement (ENSURE ENLIVE)  237 mL Oral BID BM  . furosemide  20 mg Intravenous BID  . guaiFENesin  600 mg Oral BID  . Influenza vac split quadrivalent PF  0.5 mL Intramuscular Tomorrow-1000  . levofloxacin (LEVAQUIN) IV  750 mg Intravenous Q24H  . phosphorus  250 mg Oral QID  . senna-docusate  1 tablet Oral BID  . sodium chloride  3 mL Intravenous Q12H   Infusions:  . sodium chloride 20 mL/hr at 07/19/15 0358    Assessment: Patient is hypokalemic with Potassium level of 2.7. Received KCl PO once this morning. Magnesium was checked on 10/1 and resulted within normal limits.  Goal of Therapy:  Maintain electrolytes in norma  range.  Plan:  Will order KCl boluses of IV x 4. Will recheck potassium level and magnesium level tonight at 21:00. Will follow up on levels and order as indicated.  1002 2152 potassium low, magnesium WNL. Ordered KCl 10 mEq IV x 5 doses and will recheck labs tomorrow morning.   1003 0520 K=3.6 Mg=2.0 Phos=1.9  K and Mg are WNL, phos is low. Will give of Potassium phosphate once. Will recheck level a few hours after infusion.   1004 - last night(1003 2001) K = 3.0; Ordered KCl IV x 4 doses this AM and will recheck labs this afternoon. 1004- last night phos (1003 2001) phos =2.3 Will give neutra phos-k 250 qid for 2 days. Will recheck phos 1006 AM 1004 1300 K= 3.4. Will give po tonight. Recheck in the AM  [employer], Pharm.D Clinical Pharmacist 07/20/2015

## 2015-07-20 NOTE — Progress Notes (Signed)
PT Cancellation Note  Patient Details Name: [name] MRN: [id number] DOB: [birth year]   Cancelled Treatment:    Reason Eval/Treat Not Completed: Medical issues which prohibited therapy (Per discussion with primary RN, patient remains with open surgical wound.  Pending angiogram this date with possible closure to wound later in week.  Will continue hold until surgical plans complete and patient appropriate for mobility.)   Brayden Betters H. Manson Passey, PT, DPT, NCS 07/20/2015, 10:52 AM [phone]

## 2015-07-20 NOTE — Consult Note (Signed)
ANTIBIOTIC CONSULT NOTE - INITIAL  Pharmacy Consult for levofloxacin Indication: pneumonia  Allergies  Allergen Reactions  . Morphine And Related Diarrhea and Nausea And Vomiting    Patient Measurements: Height:  (165.1 cm) Weight: 123 lb 3.2 oz (55.883 kg) IBW/kg (Calculated) : 57 Adjusted Body Weight:   Vital Signs: Temp: 97.9 F (36.6 C) (10/04 0419) Temp Source: Oral (10/04 0419) BP: 129/72 mmHg (10/04 0419) Pulse Rate: 107 (10/04 0419) Intake/Output from previous day: 10/03 0701 - 10/04 0700 In: 1236 [P.O.:420; I.V.:666; IV Piggyback:150] Out: 1201 [Urine:1200; Stool:1] Intake/Output from this shift:    Labs:  Recent Labs  07/18/15 0544 07/19/15 0520  WBC 11.7*  --   HGB 7.2*  --   PLT 114*  --   CREATININE 0.80 0.74   Estimated Creatinine Clearance: 57.7 mL/min (by C-G formula based on Cr of 0.74). No results for input(s): VANCOTROUGH, VANCOPEAK, VANCORANDOM, GENTTROUGH, GENTPEAK, GENTRANDOM, TOBRATROUGH, TOBRAPEAK, TOBRARND, AMIKACINPEAK, AMIKACINTROU, AMIKACIN in the last 72 hours.   Microbiology: Recent Results (from the past 720 hour(s))  Culture, blood (routine x 2)     Status: None   Collection Time: 07/14/15  6:53 AM  Result Value Ref Range Status   Specimen Description BLOOD RIGHT ANTECUBITAL  Final   Special Requests BOTTLES DRAWN AEROBIC AND ANAEROBIC 5CC  Final   Culture NO GROWTH 5 DAYS  Final   Report Status 07/19/2015 FINAL  Final  Culture, blood (routine x 2)     Status: None   Collection Time: 07/14/15  6:53 AM  Result Value Ref Range Status   Specimen Description BLOOD RIGHT ANTECUBITAL  Final   Special Requests BOTTLES DRAWN AEROBIC AND ANAEROBIC 5CC  Final   Culture NO GROWTH 5 DAYS  Final   Report Status 07/19/2015 FINAL  Final  MRSA PCR Screening     Status: None   Collection Time: 07/14/15  8:27 PM  Result Value Ref Range Status   MRSA by PCR NEGATIVE NEGATIVE Final    Comment:        The GeneXpert MRSA Assay  (FDA approved for NASAL specimens only), is one component of a comprehensive MRSA colonization surveillance program. It is not intended to diagnose MRSA infection nor to guide or monitor treatment for MRSA infections.   Culture, expectorated sputum-assessment     Status: None   Collection Time: 07/17/15  9:50 AM  Result Value Ref Range Status   Specimen Description SPU  Final   Special Requests NONE  Final   Sputum evaluation THIS SPECIMEN IS ACCEPTABLE FOR SPUTUM CULTURE  Final   Report Status 07/19/2015 FINAL  Final  Culture, respiratory (NON-Expectorated)     Status: None (Preliminary result)   Collection Time: 07/17/15  9:50 AM  Result Value Ref Range Status   Specimen Description SPU  Final   Special Requests NONE Reflexed from [id number]  Final   Gram Stain   Final    POOR SPECIMEN - LESS THAN 70% WBCS RARE WBC SEEN RARE GRAM POSITIVE COCCI    Culture HEAVY GROWTH YEAST IDENTIFICATION TO FOLLOW   Final   Report Status PENDING  Incomplete    Medical History: Past Medical History  Diagnosis Date  . Peripheral vascular disease     Medications:  Scheduled:  . aspirin  81 mg Oral Daily  . bisacodyl  5 mg Oral Daily  . famotidine  20 mg Oral Daily  . guaiFENesin  600 mg Oral BID  . Influenza vac split quadrivalent PF  0.5  mL Intramuscular Tomorrow-1000  . levofloxacin (LEVAQUIN) IV  750 mg Intravenous Q24H  . phosphorus  250 mg Oral QID  . potassium chloride  10 mEq Intravenous Q1 Hr x 4  . senna-docusate  1 tablet Oral BID  . sodium chloride  3 mL Intravenous Q12H   Assessment: Pt is a 69 year old female being treated for pna shown on chest x-ray. Patient was originally on zosyn 9/29-10/4 now being swtiched to levofloxacin. Pharmacy consulted to dose.  Goal of Therapy:  resolution of infection  Plan:  patient renal function is >66ml/min therefore will start  IV q 24 hours. Pharmacy to continue to monitor for improvement and renal function  Aleister Lady D  Maryssa Giampietro 07/20/2015,8:07 AM

## 2015-07-20 NOTE — Progress Notes (Signed)
Nutrition Follow-up        INTERVENTION:  Meals and snacks: Cater to pt preferences Medical Nutrition Supplement Therapy: Recommend Ensure Enlive po BID, each supplement provides 350 kcal and 20 grams of protein    NUTRITION DIAGNOSIS:   Inadequate oral intake related to acute illness as evidenced by NPO status, being addressed as on regular diet and adding supplement    GOAL:   Patient will meet greater than or equal to 90% of their needs  Progressing towards goal  MONITOR:    (Energy Intake, Anthropometrics, Digestive System, Electrolyte/Renal Profile)  REASON FOR ASSESSMENT:   Ventilator    ASSESSMENT:     Pt with shortness of breath this am.  Pt s/p Left AKA, complains of phantom pain   Current Nutrition: ate 1/2 of banana this am and coffee.  Reports chewing food makes shortness of breath worse.    Gastrointestinal Profile: Last BM: 10/4   Medications: senokot, KCL, K phosphate, dulcolax  Electrolyte/Renal Profile and Glucose Profile:   Recent Labs Lab 07/17/15 0430  07/18/15 0544  07/18/15 2152 07/19/15 0520 07/19/15 2001  NA 138  --  138  --   --  137  --   K 2.8*  < > 2.7*  < > 3.0* 3.6 3.0*  CL 99*  --  98*  --   --  103  --   CO2 31  --  31  --   --  30  --   BUN 22*  --  20  --   --  18  --   CREATININE 0.93  --  0.80  --   --  0.74  --   CALCIUM 6.9*  --  7.3*  --   --  7.3*  --   MG 1.9  --   --   --  1.9 2.0  --   PHOS 2.6  --   --   --   --  1.9* 2.3*  GLUCOSE 108*  --  110*  --   --  102*  --   < > = values in this interval not displayed. Protein Profile:   Recent Labs Lab 07/14/15 0410  ALBUMIN 3.6     Weight Trend since Admission: Filed Weights   07/17/15 0436 07/19/15 0500 07/20/15 0544  Weight: 110 lb 7.2 oz (50.1 kg) 123 lb (55.792 kg) 123 lb 3.2 oz (55.883 kg)      Diet Order:  Diet regular Room service appropriate?: Yes; Fluid consistency:: Thin  Skin:   (stage II pressure ulcer on heel)  Last BM:   10/4  Height:   Ht Readings from Last 1 Encounters:  07/14/15  (1.651 m)    Weight:   Wt Readings from Last 1 Encounters:  07/20/15 123 lb 3.2 oz (55.883 kg)      BMI:  Body mass index is 20.5 kg/(m^2).  Estimated Nutritional Needs:   Kcal:  using current weight of 50kg, BEE: 1020kcals, TEE: (IF 1.2-1.4)(AF 1.2) 1470-1715kcals  Protein:  60-75g protein (1.2-1.5g/kg)   Fluid:  1250-1558mL of fluid (25-34mL/kg)  EDUCATION NEEDS:   No education needs identified at this time  LOW Care Level  [name] B. Freida Busman, RD, LDN [phone] (pager)

## 2015-07-20 NOTE — Progress Notes (Signed)
*  PRELIMINARY RESULTS* Echocardiogram 2D Echocardiogram has been performed.  Garrel Ridgel Stills 07/20/2015, 4:09 PM

## 2015-07-20 NOTE — Progress Notes (Addendum)
PHARMACY - CRITICAL CARE PROGRESS NOTE  Pharmacy Consult for Electrolyte Management Indication: hypokalemia   Allergies  Allergen Reactions  . Morphine And Related Diarrhea and Nausea And Vomiting    Patient Measurements: Height:  (165.1 cm) Weight: 123 lb 3.2 oz (55.883 kg) IBW/kg (Calculated) : 57 Adjusted Body Weight: n/a  Vital Signs: Temp: 97.9 F (36.6 C) (10/04 0419) Temp Source: Oral (10/04 0419) BP: 129/72 mmHg (10/04 0419) Pulse Rate: 107 (10/04 0419) Intake/Output from previous day: 10/03 0701 - 10/04 0700 In: 1236 [P.O.:420; I.V.:666; IV Piggyback:150] Out: 1201 [Urine:1200; Stool:1] Intake/Output from this shift:   Vent settings for last 24 hours:    Labs:  Recent Labs  07/18/15 0544 07/18/15 2152 07/19/15 0520 07/19/15 2001  WBC 11.7*  --   --   --   HGB 7.2*  --   --   --   HCT 22.0*  --   --   --   PLT 114*  --   --   --   CREATININE 0.80  --  0.74  --   MG  --  1.9 2.0  --   PHOS  --   --  1.9* 2.3*   Estimated Creatinine Clearance: 57.7 mL/min (by C-G formula based on Cr of 0.74).  No results for input(s): GLUCAP in the last 72 hours.  Microbiology: Recent Results (from the past 720 hour(s))  Culture, blood (routine x 2)     Status: None   Collection Time: 07/14/15  6:53 AM  Result Value Ref Range Status   Specimen Description BLOOD RIGHT ANTECUBITAL  Final   Special Requests BOTTLES DRAWN AEROBIC AND ANAEROBIC 5CC  Final   Culture NO GROWTH 5 DAYS  Final   Report Status 07/19/2015 FINAL  Final  Culture, blood (routine x 2)     Status: None   Collection Time: 07/14/15  6:53 AM  Result Value Ref Range Status   Specimen Description BLOOD RIGHT ANTECUBITAL  Final   Special Requests BOTTLES DRAWN AEROBIC AND ANAEROBIC 5CC  Final   Culture NO GROWTH 5 DAYS  Final   Report Status 07/19/2015 FINAL  Final  MRSA PCR Screening     Status: None   Collection Time: 07/14/15  8:27 PM  Result Value Ref Range Status   MRSA by PCR NEGATIVE  NEGATIVE Final    Comment:        The GeneXpert MRSA Assay (FDA approved for NASAL specimens only), is one component of a comprehensive MRSA colonization surveillance program. It is not intended to diagnose MRSA infection nor to guide or monitor treatment for MRSA infections.   Culture, expectorated sputum-assessment     Status: None   Collection Time: 07/17/15  9:50 AM  Result Value Ref Range Status   Specimen Description SPU  Final   Special Requests NONE  Final   Sputum evaluation THIS SPECIMEN IS ACCEPTABLE FOR SPUTUM CULTURE  Final   Report Status 07/19/2015 FINAL  Final  Culture, respiratory (NON-Expectorated)     Status: None (Preliminary result)   Collection Time: 07/17/15  9:50 AM  Result Value Ref Range Status   Specimen Description SPU  Final   Special Requests NONE Reflexed from [id number]  Final   Gram Stain   Final    POOR SPECIMEN - LESS THAN 70% WBCS RARE WBC SEEN RARE GRAM POSITIVE COCCI    Culture HEAVY GROWTH YEAST IDENTIFICATION TO FOLLOW   Final   Report Status PENDING  Incomplete    Medications:  Scheduled:  . aspirin  81 mg Oral Daily  . bisacodyl  5 mg Oral Daily  . famotidine  20 mg Oral Daily  . guaiFENesin  600 mg Oral BID  . Influenza vac split quadrivalent PF  0.5 mL Intramuscular Tomorrow-1000  . senna-docusate  1 tablet Oral BID  . sodium chloride  3 mL Intravenous Q12H   Infusions:  . sodium chloride 20 mL/hr at 07/19/15 0358    Assessment: Patient is hypokalemic with Potassium level of 2.7. Received KCl PO once this morning. Magnesium was checked on 10/1 and resulted within normal limits.  Goal of Therapy:  Maintain electrolytes in norma range.  Plan:  Will order KCl boluses of IV x 4. Will recheck potassium level and magnesium level tonight at 21:00. Will follow up on levels and order as indicated.  1002 2152 potassium low, magnesium WNL. Ordered KCl 10 mEq IV x 5 doses and will recheck labs tomorrow morning.   1003  0520 K=3.6 Mg=2.0 Phos=1.9  K and Mg are WNL, phos is low. Will give of Potassium phosphate once. Will recheck level a few hours after infusion.   1004 - last night(1003 2001) K = 3.0; Ordered KCl IV x 4 doses this AM and will recheck labs this afternoon. 1004- last night phos (1003 2001) phos =2.3 Will give neutra phos-k 250 qid for 2 days. Will recheck phos 1006 AM  [name], Pharm.D Clinical Pharmacist 07/20/2015

## 2015-07-20 NOTE — Clinical Documentation Improvement (Signed)
Internal Medicine  Can the diagnosis of anemia be further specified in progress notes and discharge summaray?   Iron deficiency Anemia  Acute blood loss Anemia  Nutritional anemia, including the nutrition or mineral deficits  Chronic Anemia, including the suspected or known cause  Anemia of chronic disease, including the associated chronic disease state  Other  Clinically Undetermined  Document any associated diagnoses/conditions.   Supporting Information: Admitted with Arterial occulsion, sepsis, respiratory failure, acute renal failure w/ATN, septic shock, pneumonia, CKD 4 History of PVD & Right AKA  9/30 S/p Left AKA  H&P 1. Arterial occlusion: LLE pulseless, dusky, and painful. H/o stents to extremity. Cont heparin drip. Vascular surgery consult placed.  10/2 progress note  Severe anemia s/p blood transfusion and hemoglobin is at 8.4  Component     Latest Ref Rng 07/14/2015 07/14/2015 07/15/2015 07/15/2015 07/16/2015         4:10 AM 11:58 PM  6:13 AM 11:55 AM   Hemoglobin     12.0 - 16.0 g/dL 9.7 (L) 6.3 (L) 7.6 (L) 9.0 (L) 8.4 (L)  HCT     35.0 - 47.0 % 30.8 (L) 20.0 (L) 23.2 (L) 27.7 (L) 24.9 (L)   Component     Latest Ref Rng 07/17/2015 07/18/2015           Hemoglobin     12.0 - 16.0 g/dL 7.7 (L) 7.2 (L)  HCT     35.0 - 47.0 % 22.5 (L) 22.0 (L)   Transfusions of IV PRBCs Monitor CBCs  Please exercise your independent, professional judgment when responding. A specific answer is not anticipated or expected.   Thank You,  Harless Litten [employer] Information Management [place] [phone]

## 2015-07-21 ENCOUNTER — Inpatient Hospital Stay: Payer: Medicare Other

## 2015-07-21 ENCOUNTER — Encounter
Admission: EM | Disposition: A | Discharge status: Skilled Nursing Facility | Payer: Self-pay | Source: Home / Self Care | Attending: Internal Medicine

## 2015-07-21 ENCOUNTER — Encounter: Payer: Self-pay | Admitting: Physician Assistant

## 2015-07-21 DIAGNOSIS — I255 Ischemic cardiomyopathy: Secondary | ICD-10-CM

## 2015-07-21 DIAGNOSIS — I739 Peripheral vascular disease, unspecified: Secondary | ICD-10-CM

## 2015-07-21 DIAGNOSIS — I5041 Acute combined systolic (congestive) and diastolic (congestive) heart failure: Secondary | ICD-10-CM

## 2015-07-21 LAB — POTASSIUM: Potassium: 3.3 mmol/L — ABNORMAL LOW (ref 3.5–5.1)

## 2015-07-21 LAB — CULTURE, RESPIRATORY W GRAM STAIN

## 2015-07-21 LAB — CULTURE, RESPIRATORY

## 2015-07-21 LAB — SURGICAL PATHOLOGY

## 2015-07-21 SURGERY — LOWER EXTREMITY ANGIOGRAPHY
Anesthesia: Moderate Sedation | Laterality: Left

## 2015-07-21 MED ORDER — LISINOPRIL 5 MG PO TABS
5.0000 mg | ORAL_TABLET | Freq: Every day | ORAL | Status: DC
  Administered 2015-07-21: 5 mg via ORAL
  Filled 2015-07-21: qty 1

## 2015-07-21 MED ORDER — CARVEDILOL 6.25 MG PO TABS
3.1250 mg | ORAL_TABLET | Freq: Two times a day (BID) | ORAL | Status: DC
  Administered 2015-07-21 – 2015-07-22 (×3): 3.125 mg via ORAL
  Filled 2015-07-21 (×5): qty 1

## 2015-07-21 MED ORDER — HEPARIN (PORCINE) IN NACL 2-0.9 UNIT/ML-% IJ SOLN
INTRAMUSCULAR | Status: AC
  Filled 2015-07-21: qty 1000

## 2015-07-21 MED ORDER — HYDROMORPHONE HCL 1 MG/ML IJ SOLN
1.0000 mg | INTRAMUSCULAR | Status: DC | PRN
  Administered 2015-07-21 – 2015-07-23 (×9): 1 mg via INTRAVENOUS
  Filled 2015-07-21 (×12): qty 1

## 2015-07-21 MED ORDER — LIDOCAINE HCL (PF) 1 % IJ SOLN
INTRAMUSCULAR | Status: AC
  Filled 2015-07-21: qty 10

## 2015-07-21 MED ORDER — POTASSIUM CHLORIDE CRYS ER 20 MEQ PO TBCR
20.0000 meq | EXTENDED_RELEASE_TABLET | Freq: Two times a day (BID) | ORAL | Status: DC
  Administered 2015-07-21 – 2015-07-23 (×4): 20 meq via ORAL
  Filled 2015-07-21 (×5): qty 1

## 2015-07-21 MED ORDER — POTASSIUM CHLORIDE CRYS ER 20 MEQ PO TBCR
20.0000 meq | EXTENDED_RELEASE_TABLET | Freq: Once | ORAL | Status: AC
  Administered 2015-07-21: 20 meq via ORAL
  Filled 2015-07-21: qty 1

## 2015-07-21 NOTE — Progress Notes (Signed)
PT Cancellation Note  Patient Details Name: [name] MRN: [id number] DOB: 1946/08/02   Cancelled Treatment:    Reason Eval/Treat Not Completed: Medical issues which prohibited therapy (Per discussion with primary RN, patient remains with open surgical wound. Pending angiogram this date with possible closure to wound later in week. Will continue hold until surgical plans complete and patient appropriate for mobility.))  Vaughn Frieze H. Manson Passey, PT, DPT, NCS 07/21/2015, 8:55 AM [phone]

## 2015-07-21 NOTE — Progress Notes (Signed)
Dr. Allena Katz notified of CXR ordered by Dr. Winnifred Friar d/t to pt c/o of SOB.  Pt states she "feels as though it's not getting any better but it is not worse."   Will continue to monitor.

## 2015-07-21 NOTE — Progress Notes (Signed)
PHARMACY - CRITICAL CARE PROGRESS NOTE  Pharmacy Consult for Electrolyte Management Indication: hypokalemia   Allergies  Allergen Reactions  . Morphine And Related Diarrhea and Nausea And Vomiting    Patient Measurements: Height:  (165.1 cm) Weight: 123 lb 3.2 oz (55.883 kg) IBW/kg (Calculated) : 57 Adjusted Body Weight: n/a  Vital Signs: Temp: 98.2 F (36.8 C) (10/05 0433) Temp Source: Oral (10/05 0433) BP: 121/66 mmHg (10/05 0433) Pulse Rate: 95 (10/05 0433) Intake/Output from previous day: 10/04 0701 - 10/05 0700 In: 932.6 [P.O.:120; I.V.:812.6] Out: 4500 [Urine:4500] Intake/Output from this shift:   Vent settings for last 24 hours:    Labs:  Recent Labs  07/18/15 2152 07/19/15 0520 07/19/15 2001 07/20/15 1326  CREATININE  --  0.74  --  0.83  MG 1.9 2.0  --   --   PHOS  --  1.9* 2.3*  --    Estimated Creatinine Clearance: 55.7 mL/min (by C-G formula based on Cr of 0.83).  No results for input(s): GLUCAP in the last 72 hours.  Microbiology: Recent Results (from the past 720 hour(s))  Culture, blood (routine x 2)     Status: None   Collection Time: 07/14/15  6:53 AM  Result Value Ref Range Status   Specimen Description BLOOD RIGHT ANTECUBITAL  Final   Special Requests BOTTLES DRAWN AEROBIC AND ANAEROBIC 5CC  Final   Culture NO GROWTH 5 DAYS  Final   Report Status 07/19/2015 FINAL  Final  Culture, blood (routine x 2)     Status: None   Collection Time: 07/14/15  6:53 AM  Result Value Ref Range Status   Specimen Description BLOOD RIGHT ANTECUBITAL  Final   Special Requests BOTTLES DRAWN AEROBIC AND ANAEROBIC 5CC  Final   Culture NO GROWTH 5 DAYS  Final   Report Status 07/19/2015 FINAL  Final  MRSA PCR Screening     Status: None   Collection Time: 07/14/15  8:27 PM  Result Value Ref Range Status   MRSA by PCR NEGATIVE NEGATIVE Final    Comment:        The GeneXpert MRSA Assay (FDA approved for NASAL specimens only), is one component of  a comprehensive MRSA colonization surveillance program. It is not intended to diagnose MRSA infection nor to guide or monitor treatment for MRSA infections.   Culture, expectorated sputum-assessment     Status: None   Collection Time: 07/17/15  9:50 AM  Result Value Ref Range Status   Specimen Description SPU  Final   Special Requests NONE  Final   Sputum evaluation THIS SPECIMEN IS ACCEPTABLE FOR SPUTUM CULTURE  Final   Report Status 07/19/2015 FINAL  Final  Culture, respiratory (NON-Expectorated)     Status: None (Preliminary result)   Collection Time: 07/17/15  9:50 AM  Result Value Ref Range Status   Specimen Description SPU  Final   Special Requests NONE Reflexed from [id number]  Final   Gram Stain   Final    POOR SPECIMEN - LESS THAN 70% WBCS RARE WBC SEEN RARE GRAM POSITIVE COCCI    Culture HEAVY GROWTH YEAST IDENTIFICATION TO FOLLOW   Final   Report Status PENDING  Incomplete    Medications:  Scheduled:  . aspirin  81 mg Oral Daily  . bisacodyl  5 mg Oral Daily  . famotidine  20 mg Oral Daily  . feeding supplement (ENSURE ENLIVE)  237 mL Oral BID BM  . furosemide  20 mg Intravenous BID  . guaiFENesin  600  mg Oral BID  . Influenza vac split quadrivalent PF  0.5 mL Intramuscular Tomorrow-1000  . levofloxacin (LEVAQUIN) IV  750 mg Intravenous Q24H  . phosphorus  250 mg Oral QID  . senna-docusate  1 tablet Oral BID  . sodium chloride  3 mL Intravenous Q12H   Infusions:  . sodium chloride 20 mL/hr at 07/19/15 0358    Assessment: Patient is hypokalemic with Potassium level of 2.7. Received KCl PO once this morning. Magnesium was checked on 10/1 and resulted within normal limits.  Goal of Therapy:  Maintain electrolytes in norma range.  Plan:  Will order KCl boluses of IV x 4. Will recheck potassium level and magnesium level tonight at 21:00. Will follow up on levels and order as indicated.  1002 2152 potassium low, magnesium WNL. Ordered KCl 10 mEq  IV x 5 doses and will recheck labs tomorrow morning.   1003 0520 K=3.6 Mg=2.0 Phos=1.9  K and Mg are WNL, phos is low. Will give of Potassium phosphate once. Will recheck level a few hours after infusion.   1004 - last night(1003 2001) K = 3.0; Ordered KCl IV x 4 doses this AM and will recheck labs this afternoon. 1004- last night phos (1003 2001) phos =2.3 Will give neutra phos-k 250 qid for 2 days. Will recheck phos 1006 AM 1004 1300 K= 3.4. Will give po tonight. Recheck in the AM 1005 AM K+ 3.3.  KCl 20 mEq x1 PO ordered. Pt is still receiving Neutra-Phos. BMP and PO4 in AM. Pt received 84 Meq of K yesterday, K increased from 3.0 to 3.3. Patient may need maintenance K. Will start K BID and recheck K in AM.  Olene Floss, Pharm.D Clinical Pharmacist   07/21/2015

## 2015-07-21 NOTE — Progress Notes (Signed)
   07/21/15 1323  Clinical Encounter Type  Visited With Patient and family together  Visit Type Other (Comment)  Referral From Social work  Consult/Referral To Orthoptist  Spiritual Encounters  Spiritual Needs Emotional;Other (Comment)  Stress Factors  Patient Stress Factors None identified  Family Stress Factors None identified  Advance Directives (For Healthcare)  Does patient have an advance directive? Yes  Type of Estate agent of [place];Living will  Copy of advanced directive(s) in chart? Yes  Chaplain was paged to the unit to provide education for the patient and family for a HCPOA. Spoke with Child psychotherapist and then went in to discuss with patient. Assisted the patient with completing HCPOA, located two witnesses, notary and returned completed HCPOA to patient and copy to patient's medical chart. Chaplain Shem Plemmons A. Megha Agnes Ext. [phone]

## 2015-07-21 NOTE — Clinical Social Work Note (Signed)
Clinical Social Work Assessment  Patient Details  Name: [name] MRN: [id number] Date of Birth: [birth year]  Date of referral:  07/21/15               Reason for consult:  Discharge Planning                Permission sought to share information with:  Family Supports Permission granted to share information::  Yes, Verbal Permission Granted  Name::        Agency::     Relationship::     Contact Information:     Housing/Transportation Living arrangements for the past 2 months:   (home) Source of Information:  Patient, Adult Children, Spouse Patient Interpreter Needed:  None Criminal Activity/Legal Involvement Pertinent to Current Situation/Hospitalization:  No - Comment as needed Significant Relationships:  Spouse, Adult Children Lives with:  Spouse Do you feel safe going back to the place where you live?  Yes Need for family participation in patient care:  Yes (Comment)  Care giving concerns:  Patient was a previous AKA and resides with her husband and has supportive daughters that live locally.    Social Worker assessment / plan:  Patient is now a bilateral AKA and CSW met with patient and her husband and one daughter this afternoon to discuss discharge planning. Patient does not wish to consider rehab at this time in a facility. Patient and husband have a routine at home and managed at home post single amputation and they wish to return home this time with [place] in place. Patient is requesting a transfer board and may consider a lift but will wait on that decision. CSW explained that the RN CM would follow up with the home health and the equipment needs prior to discharge.   Any further upcoming procedures are weighing heavy on patient's mind today and she wishes to complete a living will prior to her next procedure. CSW has requested Pastoral Care to come and complete this with patient today.   Employment status:    Insurance informationMedia planner PT Recommendations:  Not assessed at this time Information / Referral to community resources:     Patient/Family's Response to care:  Patient is optimistic about returning home and having care provided in that environment. Patient's husband and daughter are rallying around patient providing support.  Patient/Family's Understanding of and Emotional Response to Diagnosis, Current Treatment, and Prognosis:  Patient expressed appreciation for CSW assistance and is focusing on wanting her living will to be completed.  Emotional Assessment Appearance:  Appears younger than stated age Attitude/Demeanor/Rapport:   (pleasant and cooperative and calm) Affect (typically observed):  Accepting, Adaptable, Appropriate Orientation:  Oriented to Self, Oriented to Place, Oriented to  Time, Oriented to Situation Alcohol / Substance use:  Not Applicable Psych involvement (Current and /or in the community):  No (Comment)  Discharge Needs  Concerns to be addressed:  Care Coordination Readmission within the last 30 days:  No Current discharge risk:  None Barriers to Discharge:  No Barriers Identified   Shela Leff, LCSW 07/21/2015, 12:20 PM

## 2015-07-21 NOTE — Progress Notes (Signed)
Pt refused to have angiogram this am. Pt stated " The Dr. Len Blalock past last night and told me if I don't feel up to it today I can do it tomorrow."  Call out to special procedures [id number] to follow up with patient request and to inform Doctor of patient refusing procedure this am. Day shift nurse made aware. Will continue to monitor. Bed exit alarm placed and call light and phone placed in reach.

## 2015-07-21 NOTE — Progress Notes (Addendum)
PHARMACY - CRITICAL CARE PROGRESS NOTE  Pharmacy Consult for Electrolyte Management Indication: hypokalemia   Allergies  Allergen Reactions  . Morphine And Related Diarrhea and Nausea And Vomiting    Patient Measurements: Height:  (165.1 cm) Weight: 123 lb 3.2 oz (55.883 kg) IBW/kg (Calculated) : 57 Adjusted Body Weight: n/a  Vital Signs: Temp: 98.2 F (36.8 C) (10/05 0433) Temp Source: Oral (10/05 0433) BP: 121/66 mmHg (10/05 0433) Pulse Rate: 95 (10/05 0433) Intake/Output from previous day: 10/04 0701 - 10/05 0700 In: 932.6 [P.O.:120; I.V.:812.6] Out: 4000 [Urine:4000] Intake/Output from this shift: Total I/O In: 200 [P.O.:120; I.V.:80] Out: 2000 [Urine:2000] Vent settings for last 24 hours:    Labs:  Recent Labs  07/18/15 2152 07/19/15 0520 07/19/15 2001 07/20/15 1326  CREATININE  --  0.74  --  0.83  MG 1.9 2.0  --   --   PHOS  --  1.9* 2.3*  --    Estimated Creatinine Clearance: 55.7 mL/min (by C-G formula based on Cr of 0.83).  No results for input(s): GLUCAP in the last 72 hours.  Microbiology: Recent Results (from the past 720 hour(s))  Culture, blood (routine x 2)     Status: None   Collection Time: 07/14/15  6:53 AM  Result Value Ref Range Status   Specimen Description BLOOD RIGHT ANTECUBITAL  Final   Special Requests BOTTLES DRAWN AEROBIC AND ANAEROBIC 5CC  Final   Culture NO GROWTH 5 DAYS  Final   Report Status 07/19/2015 FINAL  Final  Culture, blood (routine x 2)     Status: None   Collection Time: 07/14/15  6:53 AM  Result Value Ref Range Status   Specimen Description BLOOD RIGHT ANTECUBITAL  Final   Special Requests BOTTLES DRAWN AEROBIC AND ANAEROBIC 5CC  Final   Culture NO GROWTH 5 DAYS  Final   Report Status 07/19/2015 FINAL  Final  MRSA PCR Screening     Status: None   Collection Time: 07/14/15  8:27 PM  Result Value Ref Range Status   MRSA by PCR NEGATIVE NEGATIVE Final    Comment:        The GeneXpert MRSA Assay  (FDA approved for NASAL specimens only), is one component of a comprehensive MRSA colonization surveillance program. It is not intended to diagnose MRSA infection nor to guide or monitor treatment for MRSA infections.   Culture, expectorated sputum-assessment     Status: None   Collection Time: 07/17/15  9:50 AM  Result Value Ref Range Status   Specimen Description SPU  Final   Special Requests NONE  Final   Sputum evaluation THIS SPECIMEN IS ACCEPTABLE FOR SPUTUM CULTURE  Final   Report Status 07/19/2015 FINAL  Final  Culture, respiratory (NON-Expectorated)     Status: None (Preliminary result)   Collection Time: 07/17/15  9:50 AM  Result Value Ref Range Status   Specimen Description SPU  Final   Special Requests NONE Reflexed from [id number]  Final   Gram Stain   Final    POOR SPECIMEN - LESS THAN 70% WBCS RARE WBC SEEN RARE GRAM POSITIVE COCCI    Culture HEAVY GROWTH YEAST IDENTIFICATION TO FOLLOW   Final   Report Status PENDING  Incomplete    Medications:  Scheduled:  . aspirin  81 mg Oral Daily  . bisacodyl  5 mg Oral Daily  . famotidine  20 mg Oral Daily  . feeding supplement (ENSURE ENLIVE)  237 mL Oral BID BM  . furosemide  20 mg  Intravenous BID  . guaiFENesin  600 mg Oral BID  . Influenza vac split quadrivalent PF  0.5 mL Intramuscular Tomorrow-1000  . levofloxacin (LEVAQUIN) IV  750 mg Intravenous Q24H  . phosphorus  250 mg Oral QID  . potassium chloride  20 mEq Oral Once  . senna-docusate  1 tablet Oral BID  . sodium chloride  3 mL Intravenous Q12H   Infusions:  . sodium chloride 20 mL/hr at 07/19/15 0358    Assessment: Patient is hypokalemic with Potassium level of 2.7. Received KCl PO once this morning. Magnesium was checked on 10/1 and resulted within normal limits.  Goal of Therapy:  Maintain electrolytes in norma range.  Plan:  Will order KCl boluses of IV x 4. Will recheck potassium level and magnesium level tonight at 21:00. Will  follow up on levels and order as indicated.  1002 2152 potassium low, magnesium WNL. Ordered KCl 10 mEq IV x 5 doses and will recheck labs tomorrow morning.   1003 0520 K=3.6 Mg=2.0 Phos=1.9  K and Mg are WNL, phos is low. Will give of Potassium phosphate once. Will recheck level a few hours after infusion.   1004 - last night(1003 2001) K = 3.0; Ordered KCl IV x 4 doses this AM and will recheck labs this afternoon. 1004- last night phos (1003 2001) phos =2.3 Will give neutra phos-k 250 qid for 2 days. Will recheck phos 1006 AM 1004 1300 K= 3.4. Will give po tonight. Recheck in the AM 1005 AM K+ 3.3.  KCl 20 mEq x1 PO ordered. Pt is still receiving Neutra-Phos. BMP and PO4 in AM.  Fulton Reek, PharmD, BCPS  07/21/2015

## 2015-07-21 NOTE — Progress Notes (Signed)
[facility] Medicine Progess Note    ASSESSMENT/PLAN       ASSESSMENT/PLAN   69 yo white female with underlying severe PVD with acute hypoxic resp failure likely from acute sepsis and metabolic acidosis with acute renal failure with acute left leg ischemia, the patient is now status post left above-knee amputation.  Acute respiratory failure. -The patient appears to have a lot of pain. This appears to be making her dyspneic, I suspect much of her dyspnea is from her acute left lower extremity pain. -I reviewed the chest x-ray film and report from 07/20/2015, this shows chronic right basal atelectasis and possibly an effusion in this area. On physical exam today, I did not notice any increased dullness to percussion or decreased air entry in this area as compared to recent physical exam. Therefore, I do not think that this is causing her acute respiratory issues. However, I will order a stat portable chest x-ray to rule out an increasing pleural effusion. Currently the patient does not complain of chest pain.  Chronic hypoxic respiratory failure. -Secondary to emphysema-COPD. -Continue duo nebs, can switch to Advair and Spiriva at the time of discharge. -Will require outpatient follow-up with pulmonary, will require PFT.  Case was discussed with the nurse will be administering pain medications to help with the patient's pain and hopefully this will also help with the patient's breathing. Currently, she is going to receive a dose of Percocet and a dose of Dilaudid is also available IV, should it be necessary.          Name: [name] MRN: [id number] DOB: 12-31-1945    ADMISSION DATE:  07/14/2015    CHIEF COMPLAINT:  Dyspnea   STUDIES:     SUBJECTIVE:  Patient appears to be in severe pain. She is grasping her left lower extremity. She is sitting up in bed and appears to be very anxious and upset.  Review of Systems:  9 point review systems was  negative other than what was mentioned above. VITAL SIGNS: Temp:  [97.7 F (36.5 C)-98.4 F (36.9 C)] 98.4 F (36.9 C) (10/05 1231) Pulse Rate:  [95-109] 104 (10/05 1231) Resp:  [16-20] 18 (10/05 1231) BP: (102-121)/(45-66) 107/51 mmHg (10/05 1231) SpO2:  [91 %-97 %] 97 % (10/05 1231) FiO2 (%):  [32 %] 32 % (10/05 0925) HEMODYNAMICS:   VENTILATOR SETTINGS: Vent Mode:  [-]  FiO2 (%):  [32 %] 32 % INTAKE / OUTPUT:  Intake/Output Summary (Last 24 hours) at 07/21/15 1337 Last data filed at 07/21/15 0747  Gross per 24 hour  Intake  454.3 ml  Output   4200 ml  Net -3745.7 ml    PHYSICAL EXAMINATION: Physical Examination:   VS: BP 107/51 mmHg  Pulse 104  Temp(Src) 98.4 F (36.9 C) (Oral)  Resp 18  Ht  (1.651 m)  Wt 55.883 kg (123 lb 3.2 oz)  BMI 20.50 kg/m2  SpO2 97%  General Appearance: No distress  Neuro:without focal findings, mental status normal. HEENT: PERRLA, EOM intact. Pulmonary: normal breath sounds   CardiovascularNormal S1,S2.  No m/r/g.   Abdomen: Benign, Soft, non-tender. Renal:  No costovertebral tenderness  GU:  Not performed at this time. Endocrine: No evident thyromegaly. Skin:   warm, no rashes, no ecchymosis  Extremities: normal, no cyanosis, clubbing.   LABS:   LABORATORY PANEL:   CBC  Recent Labs Lab 07/18/15 0544  WBC 11.7*  HGB 7.2*  HCT 22.0*  PLT 114*    Chemistries  Recent Labs Lab 07/19/15 0520 07/19/15 2001 07/20/15 1326  07/21/15 0510  NA 137  --  138  --   --   K 3.6 3.0* 3.4*  < > 3.3*  CL 103  --  102  --   --   CO2 30  --  28  --   --   GLUCOSE 102*  --  113*  --   --   BUN 18  --  18  --   --   CREATININE 0.74  --  0.83  --   --   CALCIUM 7.3*  --  7.6*  --   --   MG 2.0  --   --   --   --   PHOS 1.9* 2.3*  --   --   --   < > = values in this interval not displayed.   Recent Labs Lab 07/14/15 1803  GLUCAP 107*    Recent Labs Lab 07/17/15 0351 07/17/15 0602 07/17/15 0910  PHART 7.60*  7.52* 7.46*  PCO2ART 29* 37 44  PO2ART 131* 94 92   No results for input(s): AST, ALT, ALKPHOS, BILITOT, ALBUMIN in the last 168 hours.  Cardiac Enzymes No results for input(s): TROPONINI in the last 168 hours.  RADIOLOGY:  Dg Chest Port 1 View  07/20/2015   CLINICAL DATA:  Shortness of Breath  EXAM: PORTABLE CHEST 1 VIEW  COMPARISON:  July 17, 2015  FINDINGS: Endotracheal tube and nasogastric tube have been removed. Central catheter tip is in the superior vena cava. No pneumothorax. There is generalized interstitial edema with bilateral effusions, larger on the right than on the left. There is bibasilar atelectasis. Heart is upper normal in size with pulmonary vascularity within normal limits. No adenopathy.  IMPRESSION: Central catheter tip in superior vena cava. No pneumothorax. Evidence of a degree of congestive heart failure. Effusion is increased in size on the right compared to recent prior study.   Electronically Signed   By: Bretta Bang III M.D.   On: 07/20/2015 11:10       --Wells Guiles, MD.  Pager [phone] [facility] Office Number: [phone]  Santiago Glad, M.D.  Stephanie Acre, M.D.  Billy Fischer, M.D

## 2015-07-21 NOTE — Consult Note (Signed)
Cardiology Consultation Note  Patient ID: [name], MRN: [id number], DOB/AGE: 04-12-1946 69 y.o. Admit date: 07/14/2015   Date of Consult: 07/21/2015 Primary Physician: No primary care provider on file. Primary Cardiologist: New to [facility]  Chief Complaint: Left leg pain now s/p urgent left BKA Reason for Consult: Acute combined CHF  HPI: 69 y.o. 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 Cardiology is consulted for newly found EF of 40-45%, HK of anterior myocardium, anteroseptal myocardium, and apical myocardium, elevated right sided pressure of 66 mm Hg. Cardiology was consulted the above abnormal echo.   She has no previously known cardiac history and has never seen a cardiologist before. Never with any prior stress testing or cardiac caths. She was previously a long time smoker, though does not smoke any longer. She has been followed by vascular for her PVD and is s/p prior BKA of the RLE and prior stenting of the LLE for her PVD. Prior to this she was able to get around with assistance with a prosthesis. She has had progressing ulcerations on her toes and feet.    On the day of 9/28 she suddenly developed acute onset of left lower extremity pain. She was seen by vascular upon her arrival and advised she would need angiography, though her renal function was of concern at 2.18 at that time. She was noted to be hypotensive with BP in the 90s/50s and given multiple NS bolus. She subsequently developed acute respiratory failure with hypoxia with unresponsiveness and agonal breathing later in the evening on 9/28 with a rapid response being called and was transferred to the ICU. She was ultimately intubated and sedated. Found to have PNA with sepsis requiring  pressors and was placed on ABX. This precluded her angiography on 9/29, thus her LLE was medically managed until she was stable for imaging. Renal function began to improve with pressors to correction of volume depletion. She ultimately required transfusion of pRBC and FFP on 9/29 secondary to anemia and INR of >6. 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 Plan was for her to ultimately need an angiogram to ensure her iliac system is patent followed by revision of her left AKA. She was ultimately extubated on 10/1 as her respiratory status improved. She was continued on ABX for RLL PNA per PCCM. On 10/4 she complained of exertional SOB. No chest pain. Mild cough. Echo was performed that showed EF 40-45%, hypokinesis of the anterior, anteroseptal, and apical myocardium. LV diastolic function parameters were normal. Mild MR. RV systolic function was normal. Moderate TR. PASP was 66 mm Hg. She was given IV Lasix bid, IV Solu-medrol x 1, and continued on her nebs. Output has been 8468 positive for the admission. Cardiology is consulted for the above. She currently complains only of her leg bothering her.     Past Medical History  Diagnosis Date  . Peripheral vascular disease (HCC)   . 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 Tobacco abuse       Most Recent Cardiac Studies: Echo 07/20/2015:  Study Conclusions  - Left ventricle: The cavity size was normal. Systolic function was mildly to moderately reduced. The estimated ejection fraction was in the range of 40%  to 45%. Hypokinesis of the anterior myocardium. Hypokinesis of the anteroseptal myocardium. Hypokinesis of the apical myocardium. Left ventricular diastolic function parameters were normal. - Mitral valve: There was mild regurgitation. - Right ventricle: Systolic function was normal. - Tricuspid valve: There was moderate regurgitation. - Pulmonary arteries: Systolic pressure was severely elevated. PA peak pressure:  66 mm Hg (S).  Impressions:  - Rhythm is sinus tachycardia.   Surgical History:  Past Surgical History  Procedure Laterality Date  . Below knee leg amputation    . Stents left leg    . Amputation Left 07/16/2015    Procedure: AMPUTATION ABOVE KNEE;  Surgeon: Renford Dills, MD;  Location: [facility] ORS;  Service: Vascular;  Laterality: Left;     Home Meds: Prior to Admission medications   Medication Sig Start Date End Date Taking? Authorizing Provider  aspirin 81 MG tablet Take 81 mg by mouth daily.   Yes Historical Provider, MD  famotidine (PEPCID) 20 MG tablet Take 20 mg by mouth daily.   Yes Historical Provider, MD  lisinopril-hydrochlorothiazide (PRINZIDE,ZESTORETIC) 20-12.5 MG tablet Take 1 tablet by mouth daily. 05/27/15  Yes Historical Provider, MD  magnesium hydroxide (MILK OF MAGNESIA) 400 MG/5ML suspension Take 30 mLs by mouth daily as needed for mild constipation.   Yes Historical Provider, MD  oxyCODONE-acetaminophen (PERCOCET/ROXICET) 5-325 MG per tablet Take 1 tablet by mouth every 4 (four) hours as needed for severe pain.   Yes Historical Provider, MD  warfarin (COUMADIN) 5 MG tablet Take 2.5-5 mg by mouth daily. Takes 2.5mg  on Monday, Wednesday and Friday. All other days take 5mg    Yes Historical Provider, MD    Inpatient Medications:  . aspirin  81 mg Oral Daily  . carvedilol  3.125 mg Oral BID WC  . famotidine  20 mg Oral Daily  . feeding supplement (ENSURE ENLIVE)  237 mL Oral BID BM  . furosemide  20 mg Intravenous BID  . guaiFENesin  600 mg Oral BID  . Influenza vac split quadrivalent PF  0.5 mL Intramuscular Tomorrow-1000  . levofloxacin (LEVAQUIN) IV  750 mg Intravenous Q24H  . lisinopril  5 mg Oral QHS  . phosphorus  250 mg Oral QID  . potassium chloride  20 mEq Oral BID  . senna-docusate  1 tablet Oral BID  . sodium chloride  3 mL Intravenous Q12H   . sodium chloride 20 mL/hr at 07/19/15 0358    Allergies:  Allergies  Allergen Reactions  . Morphine  And Related Diarrhea and Nausea And Vomiting    Social History   Social History  . Marital Status: Married    Spouse Name: N/A  . Number of Children: N/A  . Years of Education: N/A   Occupational History  . Not on file.   Social History Main Topics  . Smoking status: Current Some Day Smoker  . Smokeless tobacco: Not on file  . Alcohol Use: No  . Drug Use: No  . Sexual Activity: Not on file   Other Topics Concern  . Not on file   Social History Narrative     Family History  Problem Relation Age of Onset  . CAD Mother   . CAD Father   . Hypertension Mother   . Hypertension Father      Review of Systems: Review of Systems  Constitutional: Positive for weight loss and malaise/fatigue. Negative for fever, chills and diaphoresis.  HENT: Negative for congestion.   Eyes: Negative for discharge and redness.  Respiratory: Positive for  shortness of breath. Negative for cough, hemoptysis, sputum production and wheezing.   Cardiovascular: Positive for claudication and leg swelling. Negative for chest pain, palpitations, orthopnea and PND.  Gastrointestinal: Negative for nausea and vomiting.  Musculoskeletal: Negative for falls.  Skin: Negative for rash.  Neurological: Positive for weakness. Negative for sensory change, speech change and focal weakness.  Endo/Heme/Allergies: Does not bruise/bleed easily.  Psychiatric/Behavioral: Negative for substance abuse. The patient is nervous/anxious.   All other systems reviewed and are negative.   Labs: No results for input(s): CKTOTAL, CKMB, TROPONINI in the last 72 hours. Lab Results  Component Value Date   WBC 11.7* 07/18/2015   HGB 7.2* 07/18/2015   HCT 22.0* 07/18/2015   MCV 88.6 07/18/2015   PLT 114* 07/18/2015    Recent Labs Lab 07/20/15 1326  07/21/15 0510  NA 138  --   --   K 3.4*  < > 3.3*  CL 102  --   --   CO2 28  --   --   BUN 18  --   --   CREATININE 0.83  --   --   CALCIUM 7.6*  --   --   GLUCOSE 113*   --   --   < > = values in this interval not displayed. Lab Results  Component Value Date   CHOL 225* 03/09/2015   HDL 53 03/09/2015   LDLCALC 144* 03/09/2015   TRIG 141 03/09/2015   No results found for: DDIMER  Radiology/Studies:  Dg Chest 1 View  07/21/2015   CLINICAL DATA:  Shortness of Breath  EXAM: CHEST 1 VIEW  COMPARISON:  July 20, 2015  FINDINGS: Central catheter tip is in the superior vena cava. No pneumothorax. There is slightly less interstitial edema compared to 1 day prior. There is bibasilar atelectasis with bilateral pleural effusions, slightly larger on the left and not felt to be changed on the right. There may be [place] alveolar edema in the bases as well. Heart is upper normal in size with pulmonary vascularity within normal limits. No adenopathy.  IMPRESSION: There remains evidence of a degree of congestive heart failure with slightly less edema. Superimposed pneumonia in the bases cannot be excluded. Left effusion slightly larger compared to 1 day prior. Right effusion not felt to be appreciably changed. No change in cardiac silhouette.   Electronically Signed   By: Bretta Bang III M.D.   On: 07/21/2015 14:08   Dg Chest 1 View  07/17/2015   CLINICAL DATA:  hypoxia  EXAM: CHEST 1 VIEW  COMPARISON:  July 15, 2015  FINDINGS: Endotracheal tube tip is 2.7 cm above the carina. Central catheter tip is in the superior vena cava. Nasogastric tube tip and side port below the diaphragm. No pneumothorax. There is extensive interstitial opacity throughout the right lung. There are small pleural effusions bilaterally. There is consolidation in the left base. Heart is upper normal in size with pulmonary vascularity within normal limits. No adenopathy.  IMPRESSION: Tube and catheter positions without pneumothorax. Interstitial opacity throughout the right lung. The unilateral nature of this opacity is more suggestive of pneumonia than interstitial edema, although atypical interstitial  edema/congestive heart failure is possible. Both entities may be present concurrently. There are small pleural effusions bilaterally. There is focal consolidation in the left base which enlarged part may well represent atelectasis. There may be alveolar edema superimposed in this area. No new opacity appreciable. No change in cardiac silhouette.   Electronically Signed   By: Chrissie Noa  Margarita Grizzle III M.D.   On: 07/17/2015 07:33   Dg Chest 1 View  07/15/2015   CLINICAL DATA:  Dyspnea.  Ventilator.  EXAM: CHEST 1 VIEW  COMPARISON:  07/14/2015  FINDINGS: Endotracheal tube in good position. Left jugular central venous catheter tip in the SVC unchanged. NG tube in the stomach.  Extensive infiltrate throughout most of the right lung appears unchanged. Left lower lobe atelectasis unchanged.  Small right pleural effusion  COPD  IMPRESSION: Extensive infiltrate throughout the right lung is unchanged and most consistent with pneumonia. Small right effusion  Left lower lobe atelectasis unchanged  Endotracheal tube remains in good position.   Electronically Signed   By: Marlan Palau M.D.   On: 07/15/2015 07:04   Dg Abd 1 View  07/14/2015   CLINICAL DATA:  NG tube placement.  EXAM: ABDOMEN - 1 VIEW  COMPARISON:  Radiograph obtained yesterday.  FINDINGS: Tip and side port of the enteric tube below the diaphragm in the stomach. There is air within normal caliber amorphous bowel loops in the left abdomen, nonspecific. No bowel dilatation. No evidence of free air. Bi-iliac vascular stent in the lower abdomen. Interstitial change in the included lungs.  IMPRESSION: Tip and side port of the enteric tube below the diaphragm in the stomach.   Electronically Signed   By: Rubye Oaks M.D.   On: 07/14/2015 21:55   Dg Chest Port 1 View  07/20/2015   CLINICAL DATA:  Shortness of Breath  EXAM: PORTABLE CHEST 1 VIEW  COMPARISON:  July 17, 2015  FINDINGS: Endotracheal tube and nasogastric tube have been removed. Central catheter  tip is in the superior vena cava. No pneumothorax. There is generalized interstitial edema with bilateral effusions, larger on the right than on the left. There is bibasilar atelectasis. Heart is upper normal in size with pulmonary vascularity within normal limits. No adenopathy.  IMPRESSION: Central catheter tip in superior vena cava. No pneumothorax. Evidence of a degree of congestive heart failure. Effusion is increased in size on the right compared to recent prior study.   Electronically Signed   By: Bretta Bang III M.D.   On: 07/20/2015 11:10   Dg Chest Port 1 View  07/14/2015   CLINICAL DATA:  Post intubation films.  EXAM: PORTABLE CHEST 1 VIEW  COMPARISON:  February 12, 2015  FINDINGS: The heart size and mediastinal contours are within normal limits. Endotracheal tube is identified with distal tip 4.2 cm from carina. Left central venous line is identified with distal tip in the superior vena cava. Airspace opacities noted identified throughout both lungs with increased pulmonary interstitium. There is dense consolidation of left lung base with probable left pleural effusion. The visualized skeletal structures are stable.  IMPRESSION: Endotracheal tube in good position.  There is no pneumothorax.  Diffuse airspace opacities noted throughout bilateral lungs. Differential diagnosis includes pulmonary edema and bilateral pneumonias   Electronically Signed   By: Sherian Rein M.D.   On: 07/14/2015 21:10   Dg Abd Portable 1v  07/15/2015   CLINICAL DATA:  Nausea and vomiting  EXAM: PORTABLE ABDOMEN - 1 VIEW  COMPARISON:  07/14/2015  FINDINGS: Scattered large and small bowel gas is noted. A nasogastric catheter is noted within the stomach. Multiple arterial stents are seen bilaterally. Femoral arterial catheter is noted. No free air is seen. No bony abnormality is noted.  IMPRESSION: No acute abnormality seen.   Electronically Signed   By: Alcide Clever M.D.   On: 07/15/2015 17:08  Dg Abd Portable  2v  07/14/2015   CLINICAL DATA:  Abdominal pain. Blood in stool. Peripheral vascular disease.  EXAM: PORTABLE ABDOMEN - 2 VIEW  COMPARISON:  None.  FINDINGS: No evidence of dilated bowel loops or free air. Endovascular stent seen throughout the iliac arteries bilaterally. Diffuse interstitial infiltrates seen throughout the visualized portions of the lungs suspicious for diffuse pulmonary edema.  IMPRESSION: Unremarkable bowel gas pattern.  Diffuse pulmonary interstitial infiltrates suspicious for edema.   Electronically Signed   By: Myles Rosenthal M.D.   On: 07/14/2015 18:35    EKG: No ECG has been performed this admission.   Telemetry: Patient is not on telemetry.   Weights: Filed Weights   07/17/15 0436 07/19/15 0500 07/20/15 0544  Weight: 110 lb 7.2 oz (50.1 kg) 123 lb (55.792 kg) 123 lb 3.2 oz (55.883 kg)     Physical Exam: Blood pressure 107/51, pulse 104, temperature 98.4 F (36.9 C), temperature source Oral, resp. rate 18, height  (1.651 m), weight 123 lb 3.2 oz (55.883 kg), SpO2 97 %. Body mass index is 20.5 kg/(m^2). General: Well developed, well nourished, in no acute distress. Head: Normocephalic, atraumatic, sclera non-icteric, no xanthomas, nares are without discharge.  Neck: Negative for carotid bruits. JVD not elevated. Lungs: Clear bilaterally to auscultation without wheezes, rales, or rhonchi. Breathing is unlabored. Heart: RRR with S4. II/VI systolic murmurs. No rubs, or gallops appreciated. Abdomen: Soft, non-tender, non-distended with normoactive bowel sounds. No hepatomegaly. No rebound/guarding. No obvious abdominal masses. Msk:  Strength and tone appear normal for age. Extremities: No clubbing. No edema.  S/p right amputation.  Neuro: Alert and oriented X 3. No facial asymmetry. No focal deficit. Moves all extremities spontaneously. Psych:  Responds to questions appropriately with a normal affect.    Assessment and Plan:   1. 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 This work up would be best suited as a cardiac cath given her known PVD, femoral bruits, and carotid bruits -Order ECG  2. Septic shock: -Improving -Likely multifactorial in the setting of PNA and LE infection -ABX per ID/IM  3. Acute renal failure: -Improved -Per renal -Likely ATN -Limit nephrotoxic agents  4. Anemia: -S/p transfusion of pRBC  -Off heparin -Per Im -Transfuse to keep HGB >8.5  5. Ongoing tobacco abuse: -Cessation advised   Signed, Eula Listen, PA-C Pager: [phone] 07/21/2015, 2:27 PM   I have seen, examined and evaluated the patient this morning along with Mr. Shea Evans, [place].  After reviewing all the available data and chart,  I agree with his findings, examination as well as impression recommendations.  We performed the interview and examination together. The patient has had severe PAD with multiple interventions to her lower from the arteries. She is a long-term smoker with likely COPD who is status post now bilateral leg AP dictations for PAD related concerns. Most recent event was due to acute limb ischemia. Unfortunately she had some  issues postop and eventually was intubated and treated for pneumonia and sepsis. Following extubation she developed what looks like acute combined systolic and diastolic heart failure after  aggressive hydration while in shock. She was diuresed well and feels much better today as far as rest for status goes. She is just in a lot of pain with her leg.  Lee her echocardiogram shows in the reduced ejection fraction with what appears to be LAD distribution wall motion abnormality. She isn't denied any anginal symptoms whatsoever to suggest recent MI. Ultimately she will probably need it ischemic evaluation, but in light of her other comorbidities at this point, I would simply just treat her heart failure and complete the revision of the left leg amputation site. Based on her echocardiogram findings and her severe peripheral vascular disease including a right carotid bruit, I'm quite certain that she has coronary artery disease. However she cannot mention at all an episode that would angina. Her heart failure exacerbation at this particular time was more related to over aggressive volume resuscitation in a patient whose EF was unknown. Unfortunately she has not had any prior cardiac evaluation, therefore we do not know how long standing her reduced ejection fraction is.  In the absence of active anginal symptoms, I would not necessarily pursue any invasive cardiac evaluation for what is likely to be a lower risk indication revision surgery.  She should probably be on standing dose of Lasix, and would be very judicious with any perioperative hydration. She is currently on low-dose beta blocker and ACE inhibitor. Tolerating relatively well.  We will follow along to help with medical management as necessary. But she seems to be doing relatively stable from cardiac standpoint at this point.    Marykay Lex, M.D., M.S. Interventional Cardiologist   Pager # [phone]

## 2015-07-21 NOTE — Therapy (Signed)
Dr.Ramachar notified of patient's worsening SOB, increasing O2 requirement and increasing pleural effusion by chest xray on 07/20/15. Patient noted to be SOB while speaking. Patient reported difficulty eating due to SOB. Patient's nurse, Kenney Houseman,  informed of same.

## 2015-07-21 NOTE — Therapy (Signed)
Called to patient room by nurse. Patient C/O SOB at rest. SpO2 90% on 2 lpm, O2 increased to 3 lpm, SpO2 increased to 95%. PRN neb given. Patient stated she was breathing better.

## 2015-07-21 NOTE — Progress Notes (Signed)
[facility] - [place] at [facility]   PATIENT NAME: [name]    MR#:  [id number]  DATE OF BIRTH:  01-Jan-1946  SUBJECTIVE:  Complains of exertional shortness of breath this morning.  Denies chest pain. Mild cough. Issues with phantom pain REVIEW OF SYSTEMS:   Review of Systems  Constitutional: Negative for fever, chills and weight loss.  HENT: Negative for ear discharge, ear pain and nosebleeds.   Eyes: Negative for blurred vision, pain and discharge.  Respiratory: Positive for cough and shortness of breath. Negative for sputum production, wheezing and stridor.   Cardiovascular: Negative for chest pain, palpitations, orthopnea and PND.  Gastrointestinal: Negative for nausea, vomiting, abdominal pain and diarrhea.  Genitourinary: Negative for urgency and frequency.  Musculoskeletal: Positive for joint pain. Negative for back pain.  Neurological: Negative for sensory change, speech change, focal weakness and weakness.  Psychiatric/Behavioral: Negative for depression and hallucinations. The patient is not nervous/anxious.   All other systems reviewed and are negative.  Tolerating Diet:yes Tolerating PT: eval noted  DRUG ALLERGIES:   Allergies  Allergen Reactions  . Morphine And Related Diarrhea and Nausea And Vomiting   VITALS:  Blood pressure 107/51, pulse 104, temperature 98.4 F (36.9 C), temperature source Oral, resp. rate 18, height  (1.651 m), weight 55.883 kg (123 lb 3.2 oz), SpO2 97 %.  PHYSICAL EXAMINATION:   Physical Exam  GENERAL:  69 y.o.-year-old patient lying in the bed with   mild acute respiratory  distress.  EYES: Pupils equal, round, reactive to light and accommodation. No scleral icterus. Extraocular muscles intact.  HEENT: Head atraumatic, normocephalic. Oropharynx and nasopharynx clear.  NECK:  Supple, no jugular venous distention. No thyroid enlargement, no tenderness.  LUNGS: Decrease breath sounds bilaterally, no  wheezing,+ rales, no rhonchi. No use of accessory muscles of respiration.  CARDIOVASCULAR: S1, S2 normal. No murmurs, rubs, or gallops.  ABDOMEN: Soft, nontender, nondistended. Bowel sounds present. No organomegaly or mass.  EXTREMITIES: No cyanosis, clubbing or edema b/l.    NEUROLOGIC: Cranial nerves II through XII are intact. No focal Motor or sensory deficits b/l.   PSYCHIATRIC: The patient is alert and oriented x 3.  anxious SKIN: No obvious rash, lesion, or ulcer.    LABORATORY PANEL:   CBC  Recent Labs Lab 07/18/15 0544  WBC 11.7*  HGB 7.2*  HCT 22.0*  PLT 114*    Chemistries   Recent Labs Lab 07/19/15 0520  07/20/15 1326  07/21/15 0510  NA 137  --  138  --   --   K 3.6  < > 3.4*  < > 3.3*  CL 103  --  102  --   --   CO2 30  --  28  --   --   GLUCOSE 102*  --  113*  --   --   BUN 18  --  18  --   --   CREATININE 0.74  --  0.83  --   --   CALCIUM 7.3*  --  7.6*  --   --   MG 2.0  --   --   --   --   < > = values in this interval not displayed.  Cardiac Enzymes No results for input(s): TROPONINI in the last 168 hours.  RADIOLOGY:  Dg Chest Port 1 View  07/20/2015   CLINICAL DATA:  Shortness of Breath  EXAM: PORTABLE CHEST 1 VIEW  COMPARISON:  July 17, 2015  FINDINGS: Endotracheal tube  and nasogastric tube have been removed. Central catheter tip is in the superior vena cava. No pneumothorax. There is generalized interstitial edema with bilateral effusions, larger on the right than on the left. There is bibasilar atelectasis. Heart is upper normal in size with pulmonary vascularity within normal limits. No adenopathy.  IMPRESSION: Central catheter tip in superior vena cava. No pneumothorax. Evidence of a degree of congestive heart failure. Effusion is increased in size on the right compared to recent prior study.   Electronically Signed   By: Bretta Bang III M.D.   On: 07/20/2015 11:10     ASSESSMENT AND PLAN:   69 year old female admitted for arterial  thrombus of the LLE.  1. Septic shock his metabolic acidosis with PNA and prob LE infection  -IVLevaquin-appreciate ID input -BC negative  2. Left lower extremity with severe ischemia , elevated CK  LLE was pulseless, dusky, and very painful from severe ischemia now s/p left AKA day 3 H/o stents to extremity -phantom limb pain--->prn percocet -angiogram per Vascular when stable respiratory wise  2. Severe anemia appears acute on chronic Status post blood transfusion and hemoglobin is at 8.4  3. Acute  respiratory failure secondary to Pneumonia: community-acquired;  and possible acute systolic congestive heart failure with right sided pleural effusion Patient was  intubated  on September 28 extubated 10/1 Continue broad-spectrum anLevaquin chest x-ray October 4 showed increasing edema and right pleural effusion.  - IV Lasix bid --->4500 cc uop -echo shows WMA and EF 40 -45% -spoke with dr harding to see pt -?cath later date -start po coreg and lisiniopril  4. Acute renal fialure likely ATN  - improved. Creat 0.7 Renal function significantly improved with the aggressive hydration  avoid nephrotoxic agents.  Appreciate nephrology recommendations   5. Tobacco abuse: Patient still continues to smoke.  -on Nicoderm patch  6. DVT pxs: heparin drip is discontinued in view of severe anemia  Case discussed with Care Management/Social Worker. Management plans discussed with the patient, family and they are in agreement.  CODE STATUS: full  DVT Prophylaxi: no antiplt agents in view of anemia  TOTAL TIME TAKING CARE OF THIS PATIENT: 30 minutes.  >50% time spent on counselling and coordination of care pt, family  P  Reta Norgren M.D on 07/21/2015 at 2:00 PM  Between 7am to 6pm - Pager - [phone]  After 6pm go to [email or web address] - password [email or web address] [facility]  [facility]  Office  [phone]  CC: Primary care physician; No primary care provider on file.

## 2015-07-22 ENCOUNTER — Encounter: Payer: Medicare Other | Admitting: Physical Therapy

## 2015-07-22 ENCOUNTER — Encounter
Admission: EM | Disposition: A | Discharge status: Skilled Nursing Facility | Payer: Self-pay | Source: Home / Self Care | Attending: Internal Medicine

## 2015-07-22 HISTORY — PX: PERIPHERAL VASCULAR CATHETERIZATION: SHX172C

## 2015-07-22 LAB — BASIC METABOLIC PANEL
Anion gap: 4 — ABNORMAL LOW (ref 5–15)
BUN: 20 mg/dL (ref 6–20)
CO2: 28 mmol/L (ref 22–32)
Calcium: 7.5 mg/dL — ABNORMAL LOW (ref 8.9–10.3)
Chloride: 107 mmol/L (ref 101–111)
Creatinine, Ser: 0.67 mg/dL (ref 0.44–1.00)
GFR calc Af Amer: >60 mL/min (ref >60)
GFR calc non Af Amer: >60 mL/min (ref >60)
Glucose, Bld: 79 mg/dL (ref 65–99)
Potassium: 3.4 mmol/L — ABNORMAL LOW (ref 3.5–5.1)
Sodium: 139 mmol/L (ref 135–145)

## 2015-07-22 LAB — CBC
HCT: 19.8 % — ABNORMAL LOW (ref 35.0–47.0)
Hemoglobin: 6.3 g/dL — ABNORMAL LOW (ref 12.0–16.0)
MCH: 29 pg (ref 26.0–34.0)
MCHC: 32 g/dL (ref 32.0–36.0)
MCV: 90.6 fL (ref 80.0–100.0)
Platelets: 238 10*3/uL (ref 150–440)
RBC: 2.19 MIL/uL — ABNORMAL LOW (ref 3.80–5.20)
RDW: 17.9 % — ABNORMAL HIGH (ref 11.5–14.5)
WBC: 13.6 10*3/uL — ABNORMAL HIGH (ref 3.6–11.0)

## 2015-07-22 LAB — IRON AND TIBC
Iron: 22 ug/dL — ABNORMAL LOW (ref 28–170)
Saturation Ratios: 10 % — ABNORMAL LOW (ref 10.4–31.8)
TIBC: 223 ug/dL — ABNORMAL LOW (ref 250–450)
UIBC: 201 ug/dL

## 2015-07-22 LAB — PREPARE RBC (CROSSMATCH)

## 2015-07-22 LAB — PHOSPHORUS: Phosphorus: 3.7 mg/dL (ref 2.5–4.6)

## 2015-07-22 SURGERY — ABDOMINAL AORTOGRAM W/LOWER EXTREMITY
Wound class: Clean

## 2015-07-22 MED ORDER — IOHEXOL 300 MG/ML  SOLN
INTRAMUSCULAR | Status: DC | PRN
  Administered 2015-07-22: 75 mL via INTRA_ARTERIAL

## 2015-07-22 MED ORDER — LISINOPRIL 5 MG PO TABS
5.0000 mg | ORAL_TABLET | Freq: Every day | ORAL | Status: DC
  Filled 2015-07-22 (×3): qty 1

## 2015-07-22 MED ORDER — SODIUM CHLORIDE 0.9 % IV SOLN
Freq: Once | INTRAVENOUS | Status: AC
  Administered 2015-07-22: 19:00:00 via INTRAVENOUS

## 2015-07-22 MED ORDER — FENTANYL CITRATE (PF) 100 MCG/2ML IJ SOLN
INTRAMUSCULAR | Status: AC
  Filled 2015-07-22: qty 4

## 2015-07-22 MED ORDER — HEPARIN SODIUM (PORCINE) 1000 UNIT/ML IJ SOLN
INTRAMUSCULAR | Status: DC | PRN
  Administered 2015-07-22: 3500 [IU] via INTRAVENOUS

## 2015-07-22 MED ORDER — HYDROMORPHONE HCL 1 MG/ML IJ SOLN
1.0000 mg | Freq: Once | INTRAMUSCULAR | Status: AC
  Administered 2015-07-22: 1 mg via INTRAVENOUS

## 2015-07-22 MED ORDER — POTASSIUM CHLORIDE CRYS ER 20 MEQ PO TBCR
20.0000 meq | EXTENDED_RELEASE_TABLET | Freq: Once | ORAL | Status: AC
  Administered 2015-07-22: 20 meq via ORAL

## 2015-07-22 MED ORDER — CEFAZOLIN SODIUM 1-5 GM-% IV SOLN
1.0000 g | INTRAVENOUS | Status: AC
  Administered 2015-07-23: 1 g via INTRAVENOUS
  Filled 2015-07-22: qty 50

## 2015-07-22 MED ORDER — ATORVASTATIN CALCIUM 20 MG PO TABS
40.0000 mg | ORAL_TABLET | Freq: Every day | ORAL | Status: DC
  Administered 2015-07-22 – 2015-07-25 (×3): 40 mg via ORAL
  Filled 2015-07-22 (×3): qty 2

## 2015-07-22 MED ORDER — ALTEPLASE 2 MG IJ SOLR
INTRAMUSCULAR | Status: AC
Start: 2015-07-22 — End: 2015-07-22
  Filled 2015-07-22: qty 4

## 2015-07-22 MED ORDER — DEXTROSE 5 % IV SOLN
INTRAVENOUS | Status: AC
  Administered 2015-07-22: 11:00:00
  Filled 2015-07-22: qty 1.5

## 2015-07-22 MED ORDER — DIPHENHYDRAMINE HCL 50 MG/ML IJ SOLN
INTRAMUSCULAR | Status: DC | PRN
  Administered 2015-07-22: 50 mg via INTRAVENOUS

## 2015-07-22 MED ORDER — HEPARIN (PORCINE) IN NACL 2-0.9 UNIT/ML-% IJ SOLN
INTRAMUSCULAR | Status: AC
  Filled 2015-07-22: qty 1000

## 2015-07-22 MED ORDER — SODIUM CHLORIDE 0.9 % IV SOLN: Freq: Once | INTRAVENOUS | Status: DC

## 2015-07-22 MED ORDER — MIDAZOLAM HCL 5 MG/5ML IJ SOLN
INTRAMUSCULAR | Status: AC
  Filled 2015-07-22: qty 5

## 2015-07-22 MED ORDER — LIDOCAINE-EPINEPHRINE (PF) 1 %-1:200000 IJ SOLN
INTRAMUSCULAR | Status: AC
  Filled 2015-07-22: qty 30

## 2015-07-22 MED ORDER — DIPHENHYDRAMINE HCL 50 MG/ML IJ SOLN
INTRAMUSCULAR | Status: AC
  Filled 2015-07-22: qty 1

## 2015-07-22 MED ORDER — ACETAMINOPHEN 325 MG PO TABS
650.0000 mg | ORAL_TABLET | Freq: Four times a day (QID) | ORAL | Status: DC | PRN
  Administered 2015-07-22 – 2015-07-25 (×4): 650 mg via ORAL
  Filled 2015-07-22 (×4): qty 2

## 2015-07-22 MED ORDER — FENTANYL CITRATE (PF) 100 MCG/2ML IJ SOLN
INTRAMUSCULAR | Status: DC | PRN
  Administered 2015-07-22 (×6): 50 ug via INTRAVENOUS

## 2015-07-22 MED ORDER — FENTANYL CITRATE (PF) 100 MCG/2ML IJ SOLN
INTRAMUSCULAR | Status: AC
  Filled 2015-07-22: qty 2

## 2015-07-22 MED ORDER — HEPARIN SODIUM (PORCINE) 1000 UNIT/ML IJ SOLN
INTRAMUSCULAR | Status: AC
  Filled 2015-07-22: qty 1

## 2015-07-22 MED ORDER — MIDAZOLAM HCL 2 MG/2ML IJ SOLN
INTRAMUSCULAR | Status: DC | PRN
  Administered 2015-07-22 (×2): 1 mg via INTRAVENOUS
  Administered 2015-07-22: 2 mg via INTRAVENOUS
  Administered 2015-07-22: 1 mg via INTRAVENOUS

## 2015-07-22 MED ORDER — SODIUM CHLORIDE 0.9 % IV SOLN
INTRAVENOUS | Status: DC
  Administered 2015-07-23 – 2015-07-24 (×2): via INTRAVENOUS

## 2015-07-22 SURGICAL SUPPLY — 30 items
BALLN ARMADA 6.0X100X150 (BALLOONS) ×4
BALLN ARMADA 7X60X80 (BALLOONS) ×4
BALLN LUTONIX 6X150X130 (BALLOONS) ×4
BALLN LUTONIX DCB 4X80X130 (BALLOONS) ×4
BALLN ULTRV 018 6X100X75 (BALLOONS) ×4
BALLOON ARMADA 6.0X100X150 (BALLOONS) ×3 IMPLANT
BALLOON ARMADA 7X60X80 (BALLOONS) ×3 IMPLANT
BALLOON LUTONIX 6X150X130 (BALLOONS) ×3 IMPLANT
BALLOON LUTONIX DCB 4X80X130 (BALLOONS) ×3 IMPLANT
BALLOON ULTRV 018 6X100X75 (BALLOONS) ×3 IMPLANT
CANNULA 5F STIFF (CANNULA) ×4 IMPLANT
CATH 5FR PIG 65CM (CATHETERS) ×4 IMPLANT
CATH CXI 4F 90 DAV (MICROCATHETER) ×4 IMPLANT
CATH RIM 5FR (CATHETERS) ×4 IMPLANT
CATH VS15FR (CATHETERS) ×4 IMPLANT
DEVICE PRESTO INFLATION (MISCELLANEOUS) ×4 IMPLANT
DEVICE SOLENT OMNI 120CM (CATHETERS) ×4 IMPLANT
DEVICE STARCLOSE SE CLOSURE (Vascular Products) ×4 IMPLANT
DEVICE TORQUE (MISCELLANEOUS) ×4 IMPLANT
GLIDEWIRE ADV .035X260CM (WIRE) ×4 IMPLANT
PACK ANGIOGRAPHY (CUSTOM PROCEDURE TRAY) ×4 IMPLANT
SHEATH ANL2 6FRX45 HC (SHEATH) ×4 IMPLANT
SHEATH BRITE TIP 5FRX11 (SHEATH) ×4 IMPLANT
STENT VIABAHN 6X100X120 (Permanent Stent) ×4 IMPLANT
STENT VIABAHN 6X50X120 (Permanent Stent) ×4 IMPLANT
SYR MEDRAD MARK V 150ML (SYRINGE) ×4 IMPLANT
TOWEL OR 17X26 4PK STRL BLUE (TOWEL DISPOSABLE) ×4 IMPLANT
TUBING CONTRAST HIGH PRESS 72 (TUBING) ×4 IMPLANT
WIRE G 018X200 V18 (WIRE) ×4 IMPLANT
WIRE J 3MM .035X145CM (WIRE) ×4 IMPLANT

## 2015-07-22 NOTE — Care Management (Signed)
Patient for angiogram today. Anticipate discharge to home with Home Health as plan.  More to follow.

## 2015-07-22 NOTE — Progress Notes (Signed)
[facility] Pulmonary Critical Care Medicine Progess Note    ASSESSMENT/PLAN       ASSESSMENT/PLAN   69 yo white female with underlying severe PVD with acute hypoxic resp failure likely from acute sepsis and metabolic acidosis with acute renal failure with acute left leg ischemia, the patient is now status post left above-knee amputation.  COPD/emphysema with chronic hypoxic respiratory failure. -Secondary to emphysema-COPD. -Continue duo nebs, can switch to Advair and Spiriva at the time of discharge. -Will require outpatient follow-up with pulmonary, will require PFT.  Acute respiratory failure. -The patient had acute respiratory failure yesterday, which is thought to be secondary to acute pain issues from her left lower extremity ischemia. After her angiogram. Her pain resolved and her breathing is doing much better.  Smoking-negative abuse. -Discussed importance of smoking cessation.           Name: [name] MRN: [id number] DOB: 04/27/1946    ADMISSION DATE:  07/14/2015    CHIEF COMPLAINT:  Dyspnea   STUDIES:     SUBJECTIVE:  Patient is feeling much better today. Her pain appears to have been resolved. She reports no problems with breathing this afternoon.  Review of Systems:  9 point review systems was negative other than what was mentioned above.  VITAL SIGNS: Temp:  [97.7 F (36.5 C)-98.3 F (36.8 C)] 98.3 F (36.8 C) (10/06 1352) Pulse Rate:  [66-90] 71 (10/06 1352) Resp:  [12-20] 12 (10/06 1352) BP: (114-134)/(53-75) 129/53 mmHg (10/06 1352) SpO2:  [91 %-100 %] 99 % (10/06 1352) Weight:  [50.803 kg (112 lb)-50.939 kg (112 lb 4.8 oz)] 50.803 kg (112 lb) (10/06 1013)    INTAKE / OUTPUT:  Intake/Output Summary (Last 24 hours) at 07/22/15 1546 Last data filed at 07/22/15 0917  Gross per 24 hour  Intake    604 ml  Output   3510 ml  Net  -2906 ml    PHYSICAL EXAMINATION: Physical Examination:   VS: BP 129/53 mmHg  Pulse 71   Temp(Src) 98.3 F (36.8 C) (Oral)  Resp 12  Ht  (1.651 m)  Wt 50.803 kg (112 lb)  BMI 18.64 kg/m2  SpO2 99%  General Appearance: No distress  Neuro:without focal findings, mental status normal. HEENT: PERRLA, EOM intact. Pulmonary: normal breath sounds  , decreased bilaterally. CardiovascularNormal S1,S2.  No m/r/g.   Abdomen: Benign, Soft, non-tender. Renal:  No costovertebral tenderness  GU:  Not performed at this time. Endocrine: No evident thyromegaly. Skin:   warm, no rashes, no ecchymosis  Extremities: normal, no cyanosis, clubbing.   LABS:   LABORATORY PANEL:   CBC  Recent Labs Lab 07/22/15 0509  WBC 13.6*  HGB 6.3*  HCT 19.8*  PLT 238    Chemistries   Recent Labs Lab 07/19/15 0520  07/22/15 0509  NA 137  < > 139  K 3.6  < > 3.4*  CL 103  < > 107  CO2 30  < > 28  GLUCOSE 102*  < > 79  BUN 18  < > 20  CREATININE 0.74  < > 0.67  CALCIUM 7.3*  < > 7.5*  MG 2.0  --   --   PHOS 1.9*  < > 3.7  < > = values in this interval not displayed.  No results for input(s): GLUCAP in the last 168 hours.  Recent Labs Lab 07/17/15 0351 07/17/15 0602 07/17/15 0910  PHART 7.60* 7.52* 7.46*  PCO2ART 29* 37 44  PO2ART 131* 94 92  No results for input(s): AST, ALT, ALKPHOS, BILITOT, ALBUMIN in the last 168 hours.  Cardiac Enzymes No results for input(s): TROPONINI in the last 168 hours.  RADIOLOGY:  Dg Chest 1 View  07/21/2015   CLINICAL DATA:  Shortness of Breath  EXAM: CHEST 1 VIEW  COMPARISON:  July 20, 2015  FINDINGS: Central catheter tip is in the superior vena cava. No pneumothorax. There is slightly less interstitial edema compared to 1 day prior. There is bibasilar atelectasis with bilateral pleural effusions, slightly larger on the left and not felt to be changed on the right. There may be [place] alveolar edema in the bases as well. Heart is upper normal in size with pulmonary vascularity within normal limits. No adenopathy.  IMPRESSION: There  remains evidence of a degree of congestive heart failure with slightly less edema. Superimposed pneumonia in the bases cannot be excluded. Left effusion slightly larger compared to 1 day prior. Right effusion not felt to be appreciably changed. No change in cardiac silhouette.   Electronically Signed   By: Bretta Bang III M.D.   On: 07/21/2015 14:08       --Wells Guiles, MD.  Pager [phone] [place] [facility]   Santiago Glad, M.D.  Stephanie Acre, M.D.  Billy Fischer, M.D

## 2015-07-22 NOTE — Progress Notes (Addendum)
PHARMACY - CRITICAL CARE PROGRESS NOTE  Pharmacy Consult for Electrolyte Management Indication: hypokalemia   Allergies  Allergen Reactions  . Morphine And Related Diarrhea and Nausea And Vomiting    Patient Measurements: Height:  (165.1 cm) Weight: 112 lb 4.8 oz (50.939 kg) IBW/kg (Calculated) : 57 Adjusted Body Weight: n/a  Vital Signs: Temp: 98.2 F (36.8 C) (10/06 0539) Temp Source: Oral (10/06 0539) BP: 114/59 mmHg (10/06 0539) Pulse Rate: 90 (10/06 0539) Intake/Output from previous day: 10/05 0701 - 10/06 0700 In: 531 [I.V.:531] Out: 2810 [Urine:2810] Intake/Output from this shift: Total I/O In: 531 [I.V.:531] Out: 2110 [Urine:2110] Vent settings for last 24 hours: Vent Mode:  [-]  FiO2 (%):  [32 %] 32 %  Labs:  Recent Labs  07/19/15 2001 07/20/15 1326 07/22/15 0509  WBC  --   --  13.6*  HGB  --   --  6.3*  HCT  --   --  19.8*  PLT  --   --  238  CREATININE  --  0.83 0.67  PHOS 2.3*  --  3.7   Estimated Creatinine Clearance: 52.6 mL/min (by C-G formula based on Cr of 0.67).  No results for input(s): GLUCAP in the last 72 hours.  Microbiology: Recent Results (from the past 720 hour(s))  Culture, blood (routine x 2)     Status: None   Collection Time: 07/14/15  6:53 AM  Result Value Ref Range Status   Specimen Description BLOOD RIGHT ANTECUBITAL  Final   Special Requests BOTTLES DRAWN AEROBIC AND ANAEROBIC 5CC  Final   Culture NO GROWTH 5 DAYS  Final   Report Status 07/19/2015 FINAL  Final  Culture, blood (routine x 2)     Status: None   Collection Time: 07/14/15  6:53 AM  Result Value Ref Range Status   Specimen Description BLOOD RIGHT ANTECUBITAL  Final   Special Requests BOTTLES DRAWN AEROBIC AND ANAEROBIC 5CC  Final   Culture NO GROWTH 5 DAYS  Final   Report Status 07/19/2015 FINAL  Final  MRSA PCR Screening     Status: None   Collection Time: 07/14/15  8:27 PM  Result Value Ref Range Status   MRSA by PCR NEGATIVE NEGATIVE Final   Comment:        The GeneXpert MRSA Assay (FDA approved for NASAL specimens only), is one component of a comprehensive MRSA colonization surveillance program. It is not intended to diagnose MRSA infection nor to guide or monitor treatment for MRSA infections.   Culture, expectorated sputum-assessment     Status: None   Collection Time: 07/17/15  9:50 AM  Result Value Ref Range Status   Specimen Description SPU  Final   Special Requests NONE  Final   Sputum evaluation THIS SPECIMEN IS ACCEPTABLE FOR SPUTUM CULTURE  Final   Report Status 07/19/2015 FINAL  Final  Culture, respiratory (NON-Expectorated)     Status: None   Collection Time: 07/17/15  9:50 AM  Result Value Ref Range Status   Specimen Description SPU  Final   Special Requests NONE Reflexed from [id number]  Final   Gram Stain   Final    POOR SPECIMEN - LESS THAN 70% WBCS RARE WBC SEEN RARE GRAM POSITIVE COCCI    Culture HEAVY GROWTH CANDIDA SPECIES  Final   Report Status 07/21/2015 FINAL  Final    Medications:  Scheduled:  . aspirin  81 mg Oral Daily  . carvedilol  3.125 mg Oral BID WC  . famotidine  20  mg Oral Daily  . feeding supplement (ENSURE ENLIVE)  237 mL Oral BID BM  . furosemide  20 mg Intravenous BID  . guaiFENesin  600 mg Oral BID  . Influenza vac split quadrivalent PF  0.5 mL Intramuscular Tomorrow-1000  . levofloxacin (LEVAQUIN) IV  750 mg Intravenous Q24H  . lisinopril  5 mg Oral QHS  . potassium chloride  20 mEq Oral BID  . senna-docusate  1 tablet Oral BID  . sodium chloride  3 mL Intravenous Q12H   Infusions:  . sodium chloride 20 mL/hr at 07/19/15 0358    Assessment: Patient is hypokalemic with Potassium level of 2.7. Received KCl PO once this morning. Magnesium was checked on 10/1 and resulted within normal limits.  Goal of Therapy:  Maintain electrolytes in norma range.  Plan:  Will order KCl boluses of IV x 4. Will recheck potassium level and magnesium level tonight at  21:00. Will follow up on levels and order as indicated.  1002 2152 potassium low, magnesium WNL. Ordered KCl 10 mEq IV x 5 doses and will recheck labs tomorrow morning.   1003 0520 K=3.6 Mg=2.0 Phos=1.9  K and Mg are WNL, phos is low. Will give of Potassium phosphate once. Will recheck level a few hours after infusion.   1004 - last night(1003 2001) K = 3.0; Ordered KCl IV x 4 doses this AM and will recheck labs this afternoon. 1004- last night phos (1003 2001) phos =2.3 Will give neutra phos-k 250 qid for 2 days. Will recheck phos 1006 AM 1004 1300 K= 3.4. Will give po tonight. Recheck in the AM 1005 AM K+ 3.3.  KCl 20 mEq x1 PO ordered. Pt is still receiving Neutra-Phos. BMP and PO4 in AM. Pt received 84 Meq of K yesterday, K increased from 3.0 to 3.3. Patient may need maintenance K. Will start K BID and recheck K in AM.  10/56 AM K+ 3., PO4 WNL. 20 mEq x1 ordered. Recheck in AM.  Fulton Reek, PharmD, BCPS  07/22/2015

## 2015-07-22 NOTE — Progress Notes (Signed)
[facility] - [place] at [facility]   PATIENT NAME: [name]    MR#:  [id number]  DATE OF BIRTH:  [birth year]  SUBJECTIVE:   patient seen in specials recovery  Denies chest pain. Mild cough. able to lay down flat. She is on 2 L nasal oxygen uop 2810cc. REVIEW OF SYSTEMS:   Review of Systems  Constitutional: Negative for fever, chills and weight loss.  HENT: Negative for ear discharge, ear pain and nosebleeds.   Eyes: Negative for blurred vision, pain and discharge.  Respiratory: Positive for cough and shortness of breath. Negative for sputum production, wheezing and stridor.   Cardiovascular: Negative for chest pain, palpitations, orthopnea and PND.  Gastrointestinal: Negative for nausea, vomiting, abdominal pain and diarrhea.  Genitourinary: Negative for urgency and frequency.  Musculoskeletal: Positive for joint pain. Negative for back pain.  Neurological: Negative for sensory change, speech change, focal weakness and weakness.  Psychiatric/Behavioral: Negative for depression and hallucinations. The patient is not nervous/anxious.   All other systems reviewed and are negative.  Tolerating Diet:yes Tolerating PT: eval noted  DRUG ALLERGIES:   Allergies  Allergen Reactions  . Morphine And Related Diarrhea and Nausea And Vomiting   VITALS:  Blood pressure 129/53, pulse 71, temperature 98.3 F (36.8 C), temperature source Oral, resp. rate 12, height  (1.651 m), weight 50.803 kg (112 lb), SpO2 99 %.  PHYSICAL EXAMINATION:   Physical Exam  GENERAL:  69 y.o.-year-old patient lying in the bed with   mild acute respiratory  distress.  EYES: Pupils equal, round, reactive to light and accommodation. No scleral icterus. Extraocular muscles intact.  HEENT: Head atraumatic, normocephalic. Oropharynx and nasopharynx clear.  NECK:  Supple, no jugular venous distention. No thyroid enlargement, no tenderness.  LUNGS: Decrease breath sounds  bilaterally, no wheezing,+ rales, no rhonchi. No use of accessory muscles of respiration.  CARDIOVASCULAR: S1, S2 normal. No murmurs, rubs, or gallops.  ABDOMEN: Soft, nontender, nondistended. Bowel sounds present. No organomegaly or mass.  EXTREMITIES: No cyanosis, clubbing or edema b/l.    NEUROLOGIC: Cranial nerves II through XII are intact. No focal Motor or sensory deficits b/l.   PSYCHIATRIC: The patient is alert and oriented x 3.  anxious SKIN: No obvious rash, lesion, or ulcer.    LABORATORY PANEL:   CBC  Recent Labs Lab 07/22/15 0509  WBC 13.6*  HGB 6.3*  HCT 19.8*  PLT 238    Chemistries   Recent Labs Lab 07/19/15 0520  07/22/15 0509  NA 137  < > 139  K 3.6  < > 3.4*  CL 103  < > 107  CO2 30  < > 28  GLUCOSE 102*  < > 79  BUN 18  < > 20  CREATININE 0.74  < > 0.67  CALCIUM 7.3*  < > 7.5*  MG 2.0  --   --   < > = values in this interval not displayed.  Cardiac Enzymes No results for input(s): TROPONINI in the last 168 hours.  RADIOLOGY:  Dg Chest 1 View  07/21/2015   CLINICAL DATA:  Shortness of Breath  EXAM: CHEST 1 VIEW  COMPARISON:  July 20, 2015  FINDINGS: Central catheter tip is in the superior vena cava. No pneumothorax. There is slightly less interstitial edema compared to 1 day prior. There is bibasilar atelectasis with bilateral pleural effusions, slightly larger on the left and not felt to be changed on the right. There may be [place] alveolar edema in the bases  as well. Heart is upper normal in size with pulmonary vascularity within normal limits. No adenopathy.  IMPRESSION: There remains evidence of a degree of congestive heart failure with slightly less edema. Superimposed pneumonia in the bases cannot be excluded. Left effusion slightly larger compared to 1 day prior. Right effusion not felt to be appreciably changed. No change in cardiac silhouette.   Electronically Signed   By: Bretta Bang III M.D.   On: 07/21/2015 14:08     ASSESSMENT AND  PLAN:   69 year old female admitted for arterial thrombus of the LLE.  1. Septic shock his metabolic acidosis with PNA and prob LE infection  -IVLevaquin-appreciate ID input -BC negative  2. Left lower extremity with severe ischemia , elevated CK  LLE was pulseless, dusky, and very painful from severe ischemia now s/p left AKA day 4 H/o stents to extremity -phantom limb pain--->prn percocet - s/p angiogram today Vascular  2. Severe anemia appears acute on chronic Status post blood transfusion and hemoglobin is at 8.4--->6.3 Will transfuse one unit today  3. Acute  respiratory failure secondary to Pneumonia: community-acquired and acute systolic congestive heart failure with right sided pleural effusion Patient was  intubated  on September 28 extubated 10/1 Continue broad-spectrum anLevaquin chest x-ray October 4 showed increasing edema and right pleural effusion.  - IV Lasix bid --->4500 cc uop. Change to po lasix in am. And d/c foley 10/7 -echo shows WMA and EF 40 -45% -appreciate dr harding's input -?cath later date -tolerating po coreg and lisinopril  4. Acute renal fialure likely ATN  - improved. Creat 0.7   5. Tobacco abuse: Patient still continues to smoke.  -on Nicoderm patch  6. DVT pxs: heparin drip is discontinued in view of severe anemia  Case discussed with Care Management/Social Worker. Management plans discussed with the patient, family and they are in agreement.  CODE STATUS: full  DVT Prophylaxis: no antiplt agents in view of anemia  TOTAL TIME TAKING CARE OF THIS PATIENT: 30 minutes.  >50% time spent on counselling and coordination of care pt, family   Shernita Rabinovich M.D on 07/22/2015 at 2:11 PM  Between 7am to 6pm - Pager - [phone]  After 6pm go to [email or web address] - password [email or web address] [facility]  [facility]  Office  [phone]  CC: Primary care physician; No primary care provider on file.

## 2015-07-22 NOTE — Op Note (Signed)
[facility] Percutaneous Study/Intervention Procedural Note   Date of Surgery:  07/22/2015  Surgeon(s):DEW,JASON   Assistants:none  Pre-operative Diagnosis: PAD with gangrene LLE. Thrombosis of previous interventions.  Non-healing, open AKA.  Post-operative diagnosis: Same  Procedure(s) Performed: 1. Ultrasound guidance for vascular access right femoral artery 2. Catheter placement into left profunda femorus artery from right femoral approach 3. Aortogram and selective left lower extremity angiogram 4. Percutaneous transluminal angioplasty of left profunda femorus artery with 4 mm x 8 cm length Lutonix drug coated angioplasty ballon 5. Percutaneous transluminal angioplasty of left external iliac artery down to the common femoral artery with 6 mm diameter by 15 cm length Lutonix drug-coated angioplasty balloon  6.  Percutaneous transluminal angioplasty of left common iliac artery with 6 mm diameter and 7 mm diameter angioplasty balloon  7.  Catheter directed thrombolysis with 4 mg of TPA instilled to the AngioJet Omni catheter in the left common iliac artery, external iliac artery, common femoral artery, and profunda femoris artery  8.  Mechanical rheolytic thrombectomy with the AngioJet Omni catheter in the left common iliac artery, external iliac artery, and common femoral artery  9.  Viabahn covered stent placement with 6 mm diameter by 10 cm length stent to the left common and proximal external iliac artery for residual stenosis and thrombus after angioplasty, thrombolysis, and thrombectomy  10. Viabahn covered stent placement with 6 mm diameter by 5 cm length stent to the left distal external iliac artery for greater than 50% residual stenosis and thrombus after angioplasty, thrombolysis, and thrombectomy  11. StarClose closure device right femoral artery  EBL:  minimal  Indications: Patient is a 47 WF with extensive PAD who presented with thrombosis of her previous interventions last week.  She also had sepsis and pneumonia, and gangrene of her left leg.  She had an open amputation with remaining non-viable tissue that will not heal. She needs revascualrization to heal her left AKA stump. The patient is brought in for angiography for further evaluation and potential treatment. Risks and benefits are discussed and informed consent is obtained  Procedure: The patient was identified and appropriate procedural time out was performed. The patient was then placed supine on the table and prepped and draped in the usual sterile fashion. Ultrasound was used to evaluate the right common femoral artery. It was patent . A digital ultrasound image was acquired. A Seldinger needle was used to access the right common femoral artery under direct ultrasound guidance and a permanent image was performed. A 0.035 J wire was advanced without resistance and a 5Fr sheath was placed. Pigtail catheter was placed into the aorta and an AP aortogram was performed. I then pulled the pigtail catheter down and did pelvic obliques. This demonstrated the left iliac artery to be occluded about a centimeter beyond its origin with reocclusion of the common iliac artery stent and the external iliac artery stent. Originally, no distal reconstitution was really seen. I then crossed the aortic bifurcation and advanced to the left femoral head with moderate difficulty using a VS-1 catheter and advantage wire.  I confirmed intraluminal flow in the profunda femorus artery which had about a 75-80% stenosis in the main portion. Selective Left lower extremity angiogram was then performed. This demonstrated very poor flow through the profunda, with an SFA that had a flush occlusion. The patient was systemically heparinized and a 6 [place] Ansell sheath was then placed over the [employer]  wire. I then used a Kumpe catheter and  the advantage wire to navigate into secondary profunda femoris artery branches and then removed the catheter. At this point I started intervention. The profunda femoris artery stenosis was treated with a 4 mm diameter by 8 cm length Lutonix drug-coated angioplasty balloon. The inflation was taken to 8 atm for 1 minute and the balloon was deflated. The stenosis was significantly improved, but there was still some thrombus down into the profunda femoris artery. I treated the external iliac artery down to the common femoral artery with a 6 mm diameter by 15 cm length Lutonix drug-coated angioplasty balloon. This was inflated to 6 atm for 1 minute. The left common iliac artery was treated initially with a 6 mm diameter balloon and then with a 7 mm diameter angioplasty balloon inflated to 8 atm for 1 minute. Following these inflations, the entirety of the iliac system had residual thrombus and stenosis with thrombus down into the common femoral and profunda femoris artery. She had clearly had an acute occlusion of her previous interventions last week, and I elected to use 4 mg of TPA instilled in the left common iliac artery, external iliac artery, common femoral artery, and profunda femoris artery with the AngioJet Omni catheter and a power pulse spray fashion. This was allowed to dwell for about 10-15 minutes. Mechanical rheolytic thrombectomy was performed in the common iliac artery, external iliac artery, and common femoral artery. I did not want to perform mechanical rheolytic thrombectomy in the profunda femoris artery if at all possible due to the small size vessel. After these interventions, there was a flow lumen throughout the common and external iliac arteries, but significant stenosis particularly at the stent junction in the common iliac artery as well as a separate and distinct area of stenosis in residual thrombus in the mid to distal external iliac artery below the  previously placed stents was seen. The common femoral artery and profunda femoris artery had cleaned up nicely after the previous angioplasty and the TPA installation. I exchanged for a 0.018 wire and parked this well into profunda femoris artery branches. I elected to treat both iliac lesions with Viabahn covered stents. Common iliac lesion and extending down into the proximal external iliac artery initially was treated with a 6 mm diameter by 10 cm length Viabahn stent. The separate external iliac artery lesion was treated with a 6 mm diameter by 5 cm length Viabahn stent. These were postdilated with a 6 mm diameter balloon separately. The external iliac artery was inflated to 10 atm and the common iliac artery inflation was taken to 14 atm. Completion angiogram following these interventions showed markedly improved flow with about a 15-20% residual stenosis in the external iliac artery, about a 20-25% residual stenosis in the common iliac artery, and good flow without significant stenosis in the common femoral and profunda femoris artery. Profunda femoris branches went downstream to the stump.. I elected to terminate the procedure. The sheath was removed and StarClose closure device was deployed in the left femoral artery with excellent hemostatic result. The patient was taken to the recovery room in stable condition having tolerated the procedure well.  Findings:  Aortogram: Near flush left iliac occlusion with poor reconstitution initially left  Lower Extremity: 75-80% profunda femoris artery stenosis and chronic SFA occlusion  Disposition: Patient was taken to the recovery room in stable condition having tolerated the procedure well.  Complications: None  DEW,JASON 07/22/2015 12:16 PM

## 2015-07-22 NOTE — Progress Notes (Signed)
Patient is lethargic, easily arousable to voice. Reporting tolerable pain control at this time. Right groin site WNL, dressing CDI. Bilateral LE amputee, skin pale pink, warm bilaterally. Report called to [place]. Will transfer shortly.

## 2015-07-22 NOTE — Progress Notes (Signed)
PT Cancellation Note  Patient Details Name: [name] MRN: [id number] DOB: 1946/05/29   Cancelled Treatment:    Reason Eval/Treat Not Completed: Pain limiting ability to participate (Chart reviewed for continued attempt at evaluation; remains with pending angiogram and surgical plan. HgB also noted at 6.3.  PT contrainidicated at this time.  Will re-attempt when medically appropriate.)   Romayne Ticas H. Manson Passey, PT, DPT, NCS 07/22/2015, 8:10 AM [phone]

## 2015-07-23 ENCOUNTER — Encounter
Admission: EM | Disposition: A | Discharge status: Skilled Nursing Facility | Payer: Self-pay | Source: Home / Self Care | Attending: Internal Medicine

## 2015-07-23 ENCOUNTER — Inpatient Hospital Stay: Payer: Medicare Other | Admitting: Anesthesiology

## 2015-07-23 ENCOUNTER — Encounter: Payer: Self-pay | Admitting: Vascular Surgery

## 2015-07-23 DIAGNOSIS — J449 Chronic obstructive pulmonary disease, unspecified: Secondary | ICD-10-CM

## 2015-07-23 HISTORY — PX: AMPUTATION: SHX166

## 2015-07-23 LAB — CBC
HCT: 25.4 % — ABNORMAL LOW (ref 35.0–47.0)
Hemoglobin: 8.4 g/dL — ABNORMAL LOW (ref 12.0–16.0)
MCH: 29.1 pg (ref 26.0–34.0)
MCHC: 33 g/dL (ref 32.0–36.0)
MCV: 88.3 fL (ref 80.0–100.0)
Platelets: 245 10*3/uL (ref 150–440)
RBC: 2.88 MIL/uL — ABNORMAL LOW (ref 3.80–5.20)
RDW: 17 % — ABNORMAL HIGH (ref 11.5–14.5)
WBC: 18.8 10*3/uL — ABNORMAL HIGH (ref 3.6–11.0)

## 2015-07-23 LAB — TYPE AND SCREEN
ABO/RH(D): O POS
Antibody Screen: NEGATIVE
Unit division: 0

## 2015-07-23 LAB — APTT: aPTT: 33 seconds (ref 24–36)

## 2015-07-23 LAB — PROTIME-INR
INR: 1.28
Prothrombin Time: 16.2 seconds — ABNORMAL HIGH (ref 11.4–15.0)

## 2015-07-23 LAB — BASIC METABOLIC PANEL
Anion gap: 7 (ref 5–15)
BUN: 20 mg/dL (ref 6–20)
CO2: 23 mmol/L (ref 22–32)
Calcium: 7.7 mg/dL — ABNORMAL LOW (ref 8.9–10.3)
Chloride: 107 mmol/L (ref 101–111)
Creatinine, Ser: 0.71 mg/dL (ref 0.44–1.00)
GFR calc Af Amer: >60 mL/min (ref >60)
GFR calc non Af Amer: >60 mL/min (ref >60)
Glucose, Bld: 79 mg/dL (ref 65–99)
Potassium: 3.5 mmol/L (ref 3.5–5.1)
Sodium: 137 mmol/L (ref 135–145)

## 2015-07-23 SURGERY — AMPUTATION, ABOVE KNEE
Anesthesia: General | Site: Leg Upper | Laterality: Left | Wound class: Contaminated

## 2015-07-23 MED ORDER — PROPOFOL 10 MG/ML IV BOLUS
INTRAVENOUS | Status: DC | PRN
  Administered 2015-07-23: 100 mg via INTRAVENOUS

## 2015-07-23 MED ORDER — SUGAMMADEX SODIUM 200 MG/2ML IV SOLN
INTRAVENOUS | Status: DC | PRN
  Administered 2015-07-23: 100 mg via INTRAVENOUS

## 2015-07-23 MED ORDER — ROCURONIUM BROMIDE 100 MG/10ML IV SOLN
INTRAVENOUS | Status: DC | PRN
  Administered 2015-07-23: 20 mg via INTRAVENOUS
  Administered 2015-07-23: 30 mg via INTRAVENOUS

## 2015-07-23 MED ORDER — MOMETASONE FURO-FORMOTEROL FUM 200-5 MCG/ACT IN AERO
2.0000 | INHALATION_SPRAY | Freq: Two times a day (BID) | RESPIRATORY_TRACT | Status: DC
  Administered 2015-07-23 – 2015-07-25 (×6): 2 via RESPIRATORY_TRACT
  Filled 2015-07-23: qty 8.8

## 2015-07-23 MED ORDER — MIDAZOLAM HCL 2 MG/2ML IJ SOLN
INTRAMUSCULAR | Status: DC | PRN
  Administered 2015-07-23: 1 mg via INTRAVENOUS

## 2015-07-23 MED ORDER — PHENYLEPHRINE HCL 10 MG/ML IJ SOLN
INTRAMUSCULAR | Status: DC | PRN
  Administered 2015-07-23 (×4): 200 ug via INTRAVENOUS
  Administered 2015-07-23: 100 ug via INTRAVENOUS

## 2015-07-23 MED ORDER — CARVEDILOL 3.125 MG PO TABS: 3.1250 mg | ORAL_TABLET | Freq: Two times a day (BID) | ORAL | Status: DC

## 2015-07-23 MED ORDER — FENTANYL CITRATE (PF) 100 MCG/2ML IJ SOLN
INTRAMUSCULAR | Status: DC | PRN
  Administered 2015-07-23 (×2): 50 ug via INTRAVENOUS

## 2015-07-23 MED ORDER — LISINOPRIL 5 MG PO TABS: 5.0000 mg | ORAL_TABLET | Freq: Every day | ORAL | Status: DC

## 2015-07-23 MED ORDER — ATORVASTATIN CALCIUM 40 MG PO TABS: 40.0000 mg | ORAL_TABLET | Freq: Every day | ORAL | Status: DC

## 2015-07-23 MED ORDER — TIOTROPIUM BROMIDE MONOHYDRATE 18 MCG IN CAPS: 18.0000 ug | ORAL_CAPSULE | Freq: Every day | RESPIRATORY_TRACT | Status: DC

## 2015-07-23 MED ORDER — HYDROMORPHONE HCL 1 MG/ML IJ SOLN
1.0000 mg | INTRAMUSCULAR | Status: DC | PRN
  Administered 2015-07-23 – 2015-07-26 (×8): 1 mg via INTRAVENOUS
  Filled 2015-07-23 (×8): qty 1

## 2015-07-23 MED ORDER — TIOTROPIUM BROMIDE MONOHYDRATE 18 MCG IN CAPS
18.0000 ug | ORAL_CAPSULE | Freq: Every day | RESPIRATORY_TRACT | Status: DC
  Administered 2015-07-23 – 2015-07-25 (×3): 18 ug via RESPIRATORY_TRACT
  Filled 2015-07-23: qty 5

## 2015-07-23 MED ORDER — CEFAZOLIN SODIUM 1-5 GM-% IV SOLN
1.0000 g | Freq: Three times a day (TID) | INTRAVENOUS | Status: AC
  Administered 2015-07-24 (×3): 1 g via INTRAVENOUS
  Filled 2015-07-23 (×4): qty 50

## 2015-07-23 MED ORDER — PHENOL 1.4 % MT LIQD
1.0000 | OROMUCOSAL | Status: DC | PRN
  Administered 2015-07-23 – 2015-07-25 (×3): 1 via OROMUCOSAL
  Filled 2015-07-23: qty 177

## 2015-07-23 MED ORDER — OXYCODONE HCL 5 MG PO TABS
20.0000 mg | ORAL_TABLET | ORAL | Status: DC | PRN
  Administered 2015-07-23 – 2015-07-26 (×8): 20 mg via ORAL
  Filled 2015-07-23 (×8): qty 4

## 2015-07-23 MED ORDER — LACTATED RINGERS IV SOLN
INTRAVENOUS | Status: DC | PRN
  Administered 2015-07-23: 17:00:00 via INTRAVENOUS

## 2015-07-23 MED ORDER — FUROSEMIDE 20 MG PO TABS: 20.0000 mg | ORAL_TABLET | Freq: Every day | ORAL | Status: DC

## 2015-07-23 MED ORDER — FUROSEMIDE 20 MG PO TABS
20.0000 mg | ORAL_TABLET | Freq: Every day | ORAL | Status: DC
  Administered 2015-07-24 – 2015-07-25 (×2): 20 mg via ORAL
  Filled 2015-07-23 (×2): qty 1

## 2015-07-23 MED ORDER — MOMETASONE FURO-FORMOTEROL FUM 200-5 MCG/ACT IN AERO: 2.0000 | INHALATION_SPRAY | Freq: Two times a day (BID) | RESPIRATORY_TRACT | Status: DC

## 2015-07-23 SURGICAL SUPPLY — 38 items
BAG COUNTER SPONGE EZ (MISCELLANEOUS) ×2 IMPLANT
BANDAGE ELASTIC 6 CLIP NS LF (GAUZE/BANDAGES/DRESSINGS) ×2 IMPLANT
BLADE SAW GIGLI 510 (BLADE) ×2 IMPLANT
BLADE SURG SZ10 CARB STEEL (BLADE) ×2 IMPLANT
BNDG COHESIVE 4X5 TAN STRL (GAUZE/BANDAGES/DRESSINGS) ×2 IMPLANT
BNDG GAUZE 4.5X4.1 6PLY STRL (MISCELLANEOUS) ×2 IMPLANT
CANISTER SUCT 1200ML W/VALVE (MISCELLANEOUS) ×2 IMPLANT
CHLORAPREP W/TINT 26ML (MISCELLANEOUS) ×2 IMPLANT
CNTNR SPEC 2.5X3XGRAD LEK (MISCELLANEOUS) ×1
CONT SPEC 4OZ STER OR WHT (MISCELLANEOUS) ×1
CONTAINER SPEC 2.5X3XGRAD LEK (MISCELLANEOUS) ×1 IMPLANT
DRAPE INCISE IOBAN 66X45 STRL (DRAPES) ×2 IMPLANT
ELECT CAUTERY BLADE 6.4 (BLADE) ×2 IMPLANT
GAUZE FLUFF 18X24 1PLY STRL (GAUZE/BANDAGES/DRESSINGS) ×4 IMPLANT
GAUZE PETRO XEROFOAM 1X8 (MISCELLANEOUS) IMPLANT
GAUZE XEROFORM 4X4 STRL (GAUZE/BANDAGES/DRESSINGS) ×2 IMPLANT
GLOVE SURG SYN 8.0 (GLOVE) ×2 IMPLANT
GOWN STRL REUS W/ TWL LRG LVL3 (GOWN DISPOSABLE) ×1 IMPLANT
GOWN STRL REUS W/ TWL XL LVL3 (GOWN DISPOSABLE) ×1 IMPLANT
GOWN STRL REUS W/TWL LRG LVL3 (GOWN DISPOSABLE) ×1
GOWN STRL REUS W/TWL XL LVL3 (GOWN DISPOSABLE) ×1
HANDLE YANKAUER SUCT BULB TIP (MISCELLANEOUS) ×2 IMPLANT
KIT RM TURNOVER STRD PROC AR (KITS) ×2 IMPLANT
NS IRRIG 500ML POUR BTL (IV SOLUTION) ×2 IMPLANT
PACK EXTREMITY ARMC (MISCELLANEOUS) ×2 IMPLANT
PAD GROUND ADULT SPLIT (MISCELLANEOUS) ×2 IMPLANT
PAD PREP 24X41 OB/GYN DISP (PERSONAL CARE ITEMS) ×2 IMPLANT
SPONGE LAP 18X18 5 PK (GAUZE/BANDAGES/DRESSINGS) ×2 IMPLANT
STAPLER SKIN PROX 35W (STAPLE) ×4 IMPLANT
STOCKINETTE M/LG 89821 (MISCELLANEOUS) ×2 IMPLANT
SUT ETHIBOND 0 36 GRN (SUTURE) ×2 IMPLANT
SUT SILK 2 0 (SUTURE) ×1
SUT SILK 2-0 18XBRD TIE 12 (SUTURE) ×1 IMPLANT
SUT VIC AB 0 CT1 36 (SUTURE) ×6 IMPLANT
SUT VIC AB 3-0 SH 27 (SUTURE) ×1
SUT VIC AB 3-0 SH 27X BRD (SUTURE) ×1 IMPLANT
SUT VICRYL PLUS ABS 0 54 (SUTURE) ×2 IMPLANT
TAPE UMBIL 1/8X18 RADIOPA (MISCELLANEOUS) IMPLANT

## 2015-07-23 NOTE — Progress Notes (Addendum)
[facility] - [place] at [facility]   PATIENT NAME: [name]    MR#:  [id number]  DATE OF BIRTH:  Mar 14, 1946  SUBJECTIVE:   feels so much better. sats .91% on RA. Ff oxygen  REVIEW OF SYSTEMS:   Review of Systems  Constitutional: Negative for fever, chills and weight loss.  HENT: Negative for ear discharge, ear pain and nosebleeds.   Eyes: Negative for blurred vision, pain and discharge.  Respiratory: Negative for cough, sputum production, shortness of breath, wheezing and stridor.   Cardiovascular: Negative for chest pain, palpitations, orthopnea and PND.  Gastrointestinal: Negative for nausea, vomiting, abdominal pain and diarrhea.  Genitourinary: Negative for urgency and frequency.  Musculoskeletal: Positive for joint pain. Negative for back pain.  Neurological: Negative for sensory change, speech change, focal weakness and weakness.  Psychiatric/Behavioral: Negative for depression and hallucinations. The patient is not nervous/anxious.   All other systems reviewed and are negative.  Tolerating Diet:yes Tolerating PT: eval noted  DRUG ALLERGIES:   Allergies  Allergen Reactions  . Morphine And Related Diarrhea and Nausea And Vomiting   VITALS:  Blood pressure 105/51, pulse 91, temperature 97.7 F (36.5 C), temperature source Oral, resp. rate 16, height  (1.651 m), weight 48.308 kg (106 lb 8 oz), SpO2 92 %.  PHYSICAL EXAMINATION:   Physical Exam  GENERAL:  69 y.o.-year-old patient lying in the bed with   mild acute respiratory  distress.  EYES: Pupils equal, round, reactive to light and accommodation. No scleral icterus. Extraocular muscles intact.  HEENT: Head atraumatic, normocephalic. Oropharynx and nasopharynx clear.  NECK:  Supple, no jugular venous distention. No thyroid enlargement, no tenderness.  LUNGS: Decrease breath sounds bilaterally, no wheezing,no rales, no rhonchi. No use of accessory muscles of respiration.   CARDIOVASCULAR: S1, S2 normal. No murmurs, rubs, or gallops.  ABDOMEN: Soft, nontender, nondistended. Bowel sounds present. No organomegaly or mass.  EXTREMITIES: No cyanosis, clubbing or edema b/l.    NEUROLOGIC: Cranial nerves II through XII are intact. No focal Motor or sensory deficits b/l.   PSYCHIATRIC: The patient is alert and oriented x 3.  anxious SKIN: No obvious rash, lesion, or ulcer.    LABORATORY PANEL:   CBC  Recent Labs Lab 07/23/15 0443  WBC 18.8*  HGB 8.4*  HCT 25.4*  PLT 245    Chemistries   Recent Labs Lab 07/19/15 0520  07/23/15 0443  NA 137  < > 137  K 3.6  < > 3.5  CL 103  < > 107  CO2 30  < > 23  GLUCOSE 102*  < > 79  BUN 18  < > 20  CREATININE 0.74  < > 0.71  CALCIUM 7.3*  < > 7.7*  MG 2.0  --   --   < > = values in this interval not displayed.  Cardiac Enzymes No results for input(s): TROPONINI in the last 168 hours.  RADIOLOGY:  Dg Chest 1 View  07/21/2015   CLINICAL DATA:  Shortness of Breath  EXAM: CHEST 1 VIEW  COMPARISON:  July 20, 2015  FINDINGS: Central catheter tip is in the superior vena cava. No pneumothorax. There is slightly less interstitial edema compared to 1 day prior. There is bibasilar atelectasis with bilateral pleural effusions, slightly larger on the left and not felt to be changed on the right. There may be [place] alveolar edema in the bases as well. Heart is upper normal in size with pulmonary vascularity within normal limits. No  adenopathy.  IMPRESSION: There remains evidence of a degree of congestive heart failure with slightly less edema. Superimposed pneumonia in the bases cannot be excluded. Left effusion slightly larger compared to 1 day prior. Right effusion not felt to be appreciably changed. No change in cardiac silhouette.   Electronically Signed   By: William  Woodruff III M.D.   On: 07/21/2015 Bretta BangSSMENT AND PLAN:   69 year old female admitted for arterial thrombus of the LLE.  1. Septic shock  his metabolic acidosis with PNA and prob LE infection  -IVLevaquin-appreciate ID input--->completed course -BC negative  2. Left lower extremity with severe ischemia , elevated CK  LLE was pulseless, dusky, and very painful from severe ischemia now s/p left AKA day 4 H/o stents to extremity -phantom limb pain--->prn percocet - s/p angiogram and now for closure of left leg amputation  2. Severe anemia appears acute on chronic Status post blood transfusion and hemoglobin is at 8.4--->6.3-->2nd unit--->8.4  3. Acute  respiratory failure secondary to Pneumonia: community-acquired and acute systolic congestive heart failure with right sided pleural effusion Patient was  intubated  on September 28 extubated 10/1 completed rx with Levaquin  -chest x-ray October 4 showed increasing edema and right pleural effusion.  - IV Lasix bid --->4500 cc uop. Change to po lasix qd for 10 days (total). d/ced foley 10/7 -echo shows WMA and EF 40 -45% -appreciate dr harding's input--->out pt pt should follow with Dr Mariah Milling - po coreg and lisinopril  4. Acute renal fialure likely ATN  - improved. Creat 0.7   5. Tobacco abuse: Patient still continues to smoke.  -on Nicoderm patch  6. DVT pxs: heparin drip is discontinued in view of severe anemia   Case discussed with Care Management/Social Worker. Management plans discussed with the patient, family and they are in agreement.  CODE STATUS: full  DVT Prophylaxis: no antiplt agents in view of anemia  TOTAL TIME TAKING CARE OF THIS PATIENT: 30 minutes.  >50% time spent on counselling and coordination of care pt, family (dters)   [name] M.D on 07/23/2015 at 11:07 AM  Between 7am to 6pm - Pager - [phone]  After 6pm go to [email or web address] - password [email or web address] [facility]  [facility]  Office  [phone]  CC: Primary care physician; No primary care provider on file.

## 2015-07-23 NOTE — Care Management Important Message (Signed)
Important Message  Patient Details  Name: [name] MRN: [id number] Date of Birth: [birth year]   Medicare Important Message Given:  Other (see comment) (5th message given)    Adonis Huguenin, RN 07/23/2015, 10:15 AM

## 2015-07-23 NOTE — Progress Notes (Signed)
[facility] [place] [facility] Progess Note    ASSESSMENT/PLAN       ASSESSMENT/PLAN   69 yo white female with underlying severe PVD with acute hypoxic resp failure likely from acute sepsis and metabolic acidosis with acute renal failure with acute left leg ischemia, the patient is now status post left above-knee amputation.  Resp failure/sepsis-resolved  COPD/emphysema with chronic hypoxic respiratory failure. -Secondary to emphysema-COPD. -will start dulera and  spiriva while inpatient  -Will require outpatient follow-up with pulmonary, will require PFT at some point.  Acute respiratory failure-resolved  Smoking-negative abuse. -Discussed importance of smoking cessation.           Name: [name] MRN: [id number] DOB: Dec 01, 1945    ADMISSION DATE:  07/14/2015    CHIEF COMPLAINT:  Dyspnea   STUDIES:     SUBJECTIVE:  Patient is feeling much better today. Off oxygen Her pain appears to have been resolved. She reports no problems with breathing. Maybe back to OR today to close left leg surgical wound  Review of Systems:  9 point review systems was negative other than what was mentioned above.  VITAL SIGNS: Temp:  [97.7 F (36.5 C)-100.3 F (37.9 C)] 97.7 F (36.5 C) (10/07 0454) Pulse Rate:  [66-121] 91 (10/07 0454) Resp:  [12-19] 16 (10/07 0454) BP: (84-134)/(39-75) 105/51 mmHg (10/07 0454) SpO2:  [91 %-100 %] 92 % (10/07 0454) Weight:  [106 lb 8 oz (48.308 kg)-112 lb (50.803 kg)] 106 lb 8 oz (48.308 kg) (10/07 0500)    INTAKE / OUTPUT:  Intake/Output Summary (Last 24 hours) at 07/23/15 0937 Last data filed at 07/23/15 0507  Gross per 24 hour  Intake 673.48 ml  Output   2850 ml  Net -2176.52 ml    PHYSICAL EXAMINATION: Physical Examination:   VS: BP 105/51 mmHg  Pulse 91  Temp(Src) 97.7 F (36.5 C) (Oral)  Resp 16  Ht  (1.651 m)  Wt 106 lb 8 oz (48.308 kg)  BMI 17.72 kg/m2  SpO2 92%  General  Appearance: No distress  Neuro:without focal findings, mental status normal. HEENT: PERRLA, EOM intact. Pulmonary: normal breath sounds  , decreased bilaterally. CardiovascularNormal S1,S2.  No m/r/g.   Abdomen: Benign, Soft, non-tender. Renal:  No costovertebral tenderness  GU:  Not performed at this time. Endocrine: No evident thyromegaly. Skin:   warm, no rashes, no ecchymosis  Extremities: s/p new left AKA, s/p rt aka  LABS:   LABORATORY PANEL:   CBC  Recent Labs Lab 07/23/15 0443  WBC 18.8*  HGB 8.4*  HCT 25.4*  PLT 245    Chemistries   Recent Labs Lab 07/19/15 0520  07/22/15 0509 07/23/15 0443  NA 137  < > 139 137  K 3.6  < > 3.4* 3.5  CL 103  < > 107 107  CO2 30  < > 28 23  GLUCOSE 102*  < > 79 79  BUN 18  < > 20 20  CREATININE 0.74  < > 0.67 0.71  CALCIUM 7.3*  < > 7.5* 7.7*  MG 2.0  --   --   --   PHOS 1.9*  < > 3.7  --   < > = values in this interval not displayed.  No results for input(s): GLUCAP in the last 168 hours.  Recent Labs Lab 07/17/15 0351 07/17/15 0602 07/17/15 0910  PHART 7.60* 7.52* 7.46*  PCO2ART 29* 37 44  PO2ART 131* 94 92   No results for input(s): AST, ALT,  ALKPHOS, BILITOT, ALBUMIN in the last 168 hours.  Cardiac Enzymes No results for input(s): TROPONINI in the last 168 hours.  RADIOLOGY:  Dg Chest 1 View  07/21/2015   CLINICAL DATA:  Shortness of Breath  EXAM: CHEST 1 VIEW  COMPARISON:  July 20, 2015  FINDINGS: Central catheter tip is in the superior vena cava. No pneumothorax. There is slightly less interstitial edema compared to 1 day prior. There is bibasilar atelectasis with bilateral pleural effusions, slightly larger on the left and not felt to be changed on the right. There may be [place] alveolar edema in the bases as well. Heart is upper normal in size with pulmonary vascularity within normal limits. No adenopathy.  IMPRESSION: There remains evidence of a degree of congestive heart failure with slightly less  edema. Superimposed pneumonia in the bases cannot be excluded. Left effusion slightly larger compared to 1 day prior. Right effusion not felt to be appreciably changed. No change in cardiac silhouette.   Electronically Signed   By: Bretta Bang III M.D.   On: 07/21/2015 14:08     Will sign off, please feel free to call with any questions  I have personally obtained a history, examined the patient, evaluated Pertinent laboratory and RadioGraphic/imaging results, and  formulated the assessment and plan   The Patient requires high complexity decision making for assessment and support, frequent evaluation and titration of therapies.  Patient/Family are satisfied with Plan of action and management. All questions answered  Lucie Leather, M.D.  [facility]  Medical Director [facility] Medical Director [facility] Cardio-Pulmonary Department

## 2015-07-23 NOTE — Transfer of Care (Signed)
Immediate Anesthesia Transfer of Care Note  Patient: [name]  Procedure(s) Performed: Procedure(s):   WOUND CLOSURE (Left)  Patient Location: PACU  Anesthesia Type:General  Level of Consciousness: awake, alert , oriented and patient cooperative  Airway & Oxygen Therapy: Patient Spontanous Breathing and Patient connected to face mask oxygen  Post-op Assessment: Report given to RN, Post -op Vital signs reviewed and stable and Patient moving all extremities X 4  Post vital signs: Reviewed and stable  Last Vitals:  Filed Vitals:   07/23/15 1804  BP: 120/51  Pulse: 80  Temp: 37.2 C  Resp: 16    Complications: No apparent anesthesia complications

## 2015-07-23 NOTE — Anesthesia Preprocedure Evaluation (Signed)
Anesthesia Evaluation  Patient identified by MRN, date of birth, ID band Patient awake    Reviewed: Allergy & Precautions, H&P , NPO status , Patient's Chart, lab work & pertinent test results, reviewed documented beta blocker date and time   Airway Mallampati: II  TM Distance: >3 FB Neck ROM: full    Dental  (+) Teeth Intact   Pulmonary neg pulmonary ROS, pneumonia, unresolved, Current Smoker, former smoker,    Pulmonary exam normal        Cardiovascular Exercise Tolerance: Poor + Peripheral Vascular Disease  negative cardio ROS Normal cardiovascular exam Rhythm:regular Rate:Normal     Neuro/Psych negative neurological ROS  negative psych ROS   GI/Hepatic negative GI ROS, Neg liver ROS,   Endo/Other  negative endocrine ROS  Renal/GU negative Renal ROS  negative genitourinary   Musculoskeletal   Abdominal   Peds  Hematology negative hematology ROS (+)   Anesthesia Other Findings   Reproductive/Obstetrics negative OB ROS                             Anesthesia Physical  Anesthesia Plan  ASA: III and emergent  Anesthesia Plan: General   Post-op Pain Management:    Induction:   Airway Management Planned: Oral ETT  Additional Equipment:   Intra-op Plan:   Post-operative Plan:   Informed Consent: I have reviewed the patients History and Physical, chart, labs and discussed the procedure including the risks, benefits and alternatives for the proposed anesthesia with the patient or authorized representative who has indicated his/her understanding and acceptance.     Plan Discussed with: CRNA  Anesthesia Plan Comments:         Anesthesia Quick Evaluation

## 2015-07-23 NOTE — Progress Notes (Signed)
PT Cancellation Note  Patient Details Name: [name] MRN: [id number] DOB: [birth year]   Cancelled Treatment:    Reason Eval/Treat Not Completed: Medical issues which prohibited therapy (Patient scheduled for procedure for limb closure and amputation revision this date (under general).  Will require new orders to initiate PT services as medically appropriate.)   Sade Hollon H. Manson Passey, PT, DPT, NCS 07/23/2015, 9:29 AM [phone]

## 2015-07-23 NOTE — Anesthesia Procedure Notes (Signed)
Procedure Name: Intubation Date/Time: 07/23/2015 4:51 PM Performed by: Michaele Offer Pre-anesthesia Checklist: Patient identified, Emergency Drugs available, Suction available, Patient being monitored and Timeout performed Patient Re-evaluated:Patient Re-evaluated prior to inductionOxygen Delivery Method: Circle system utilized Preoxygenation: Pre-oxygenation with 100% oxygen Intubation Type: IV induction Ventilation: Mask ventilation without difficulty Laryngoscope Size: Mac and 3 Grade View: Grade I Tube type: Oral Number of attempts: 1 Airway Equipment and Method: Rigid stylet Placement Confirmation: ETT inserted through vocal cords under direct vision,  positive ETCO2 and breath sounds checked- equal and bilateral Secured at: 19 cm Tube secured with: Tape Dental Injury: Teeth and Oropharynx as per pre-operative assessment  Comments: Dentures removed before intubation. Circulating nurse has dentures.

## 2015-07-23 NOTE — Op Note (Signed)
  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 female who presents with left leg gangrene.  The patient is scheduled for a left above-the-knee amputation.  I discussed in depth with the patient the risks, benefits, and alternatives to this procedure.  The patient is aware that the risk of this operation included but are not limited to:  bleeding, infection, myocardial infarction, stroke, death, failure to heal amputation wound, and possible need for more proximal amputation.  The patient is aware of the risks and agrees proceed forward with the procedure.  DESCRIPTION: After full informed written consent was obtained from the patient, the patient was taken to the operating room, and placed supine upon the operating table.  Prior to induction, the patient received IV antibiotics.  The patient's open amputation was then prepped and draped in the standard fashion for formalizing the above-the-knee amputation.  After obtaining adequate anesthesia, the patient was prepped and draped in the standard fashion for a above-the-knee amputation. I made the incisions for debridement of the muscle bellies, and then dissected through the subcutaneous tissue, fascia, and muscles circumferentially.  I elevated  the periosteal tissue 4-5 cm more proximal than the anterior skin flap.  I then transected the femur with a Gigli saw at this level.   At this point, the specimen was passed off the field as the above-the-knee amputation.  At this point, I clamped all visibly  bleeding arteries and veins using a combination of suture ligation with silk suture and electrocautery.   Bleeding continued to be controlled with electrocautery and suture ligature.  The stump was washed off with sterile normal saline and no further active bleeding was noted.  I reapproximated the anterior and posterior fascia  with interrupted stitches of 0 Vicryl.  This was completed along the entire length of anterior and posterior fascia until there were no more loose space in the fascial line. The subcutaneous tissue was then approximated with 2-0 vicryl sutures. The skin was then  reapproximated with staples.  The stump was washed off and dried.  The incision was dressed with Xeroform and ABD pads, and  then fluffs were applied.  Kerlix was wrapped around the leg and then gently an ACE wrap was applied.  A large Ioban was then placed over the ACE wrap to secure the dressing. The patient was then awakened and take to the recovery room in stable condition.   COMPLICATIONS: None  CONDITION: Unchanged  Renford Dills  07/23/2015, 6:22 PM

## 2015-07-23 NOTE — Progress Notes (Signed)
PHARMACY - CRITICAL CARE PROGRESS NOTE  Pharmacy Consult for Electrolyte Management Indication: hypokalemia   Allergies  Allergen Reactions  . Morphine And Related Diarrhea and Nausea And Vomiting    Patient Measurements: Height:  (patient bilateral amputee since height last recorded) Weight: 106 lb 8 oz (48.308 kg) IBW/kg (Calculated) : 57 Adjusted Body Weight: n/a  Vital Signs: Temp: 97.7 F (36.5 C) (10/07 0454) Temp Source: Oral (10/07 0454) BP: 105/51 mmHg (10/07 0454) Pulse Rate: 91 (10/07 0454) Intake/Output from previous day: 10/06 0701 - 10/07 0700 In: 746.5 [P.O.:10; I.V.:443.5; Blood:293] Out: 3550 [Urine:3550] Intake/Output from this shift: Total I/O In: 663.5 [I.V.:370.5; Blood:293] Out: 1100 [Urine:1100] Vent settings for last 24 hours:    Labs:  Recent Labs  07/20/15 1326 07/22/15 0509 07/23/15 0443  WBC  --  13.6* 18.8*  HGB  --  6.3* 8.4*  HCT  --  19.8* 25.4*  PLT  --  238 245  APTT  --   --  33  INR  --   --  1.28  CREATININE 0.83 0.67 0.71  PHOS  --  3.7  --    Estimated Creatinine Clearance: 49.9 mL/min (by C-G formula based on Cr of 0.71).  No results for input(s): GLUCAP in the last 72 hours.  Microbiology: Recent Results (from the past 720 hour(s))  Culture, blood (routine x 2)     Status: None   Collection Time: 07/14/15  6:53 AM  Result Value Ref Range Status   Specimen Description BLOOD RIGHT ANTECUBITAL  Final   Special Requests BOTTLES DRAWN AEROBIC AND ANAEROBIC 5CC  Final   Culture NO GROWTH 5 DAYS  Final   Report Status 07/19/2015 FINAL  Final  Culture, blood (routine x 2)     Status: None   Collection Time: 07/14/15  6:53 AM  Result Value Ref Range Status   Specimen Description BLOOD RIGHT ANTECUBITAL  Final   Special Requests BOTTLES DRAWN AEROBIC AND ANAEROBIC 5CC  Final   Culture NO GROWTH 5 DAYS  Final   Report Status 07/19/2015 FINAL  Final  MRSA PCR Screening     Status: None   Collection Time: 07/14/15  8:27  PM  Result Value Ref Range Status   MRSA by PCR NEGATIVE NEGATIVE Final    Comment:        The GeneXpert MRSA Assay (FDA approved for NASAL specimens only), is one component of a comprehensive MRSA colonization surveillance program. It is not intended to diagnose MRSA infection nor to guide or monitor treatment for MRSA infections.   Culture, expectorated sputum-assessment     Status: None   Collection Time: 07/17/15  9:50 AM  Result Value Ref Range Status   Specimen Description SPU  Final   Special Requests NONE  Final   Sputum evaluation THIS SPECIMEN IS ACCEPTABLE FOR SPUTUM CULTURE  Final   Report Status 07/19/2015 FINAL  Final  Culture, respiratory (NON-Expectorated)     Status: None   Collection Time: 07/17/15  9:50 AM  Result Value Ref Range Status   Specimen Description SPU  Final   Special Requests NONE Reflexed from [id number]  Final   Gram Stain   Final    POOR SPECIMEN - LESS THAN 70% WBCS RARE WBC SEEN RARE GRAM POSITIVE COCCI    Culture HEAVY GROWTH CANDIDA SPECIES  Final   Report Status 07/21/2015 FINAL  Final    Medications:  Scheduled:  . aspirin  81 mg Oral Daily  . atorvastatin  40  mg Oral q1800  . carvedilol  3.125 mg Oral BID WC  . famotidine  20 mg Oral Daily  . feeding supplement (ENSURE ENLIVE)  237 mL Oral BID BM  . furosemide  20 mg Intravenous BID  . guaiFENesin  600 mg Oral BID  . Influenza vac split quadrivalent PF  0.5 mL Intramuscular Tomorrow-1000  . levofloxacin (LEVAQUIN) IV  750 mg Intravenous Q24H  . lisinopril  5 mg Oral QHS  . potassium chloride  20 mEq Oral BID  . senna-docusate  1 tablet Oral BID  . sodium chloride  3 mL Intravenous Q12H   Infusions:  . sodium chloride 75 mL/hr at 07/23/15 0507    Assessment: Patient is hypokalemic with Potassium level of 2.7. Received KCl PO once this morning. Magnesium was checked on 10/1 and resulted within normal limits.  Goal of Therapy:  Maintain electrolytes in norma  range.  Plan:  Will order KCl boluses of IV x 4. Will recheck potassium level and magnesium level tonight at 21:00. Will follow up on levels and order as indicated.  1002 2152 potassium low, magnesium WNL. Ordered KCl 10 mEq IV x 5 doses and will recheck labs tomorrow morning.   1003 0520 K=3.6 Mg=2.0 Phos=1.9  K and Mg are WNL, phos is low. Will give of Potassium phosphate once. Will recheck level a few hours after infusion.   1004 - last night(1003 2001) K = 3.0; Ordered KCl IV x 4 doses this AM and will recheck labs this afternoon. 1004- last night phos (1003 2001) phos =2.3 Will give neutra phos-k 250 qid for 2 days. Will recheck phos 1006 AM 1004 1300 K= 3.4. Will give po tonight. Recheck in the AM 1005 AM K+ 3.3.  KCl 20 mEq x1 PO ordered. Pt is still receiving Neutra-Phos. BMP and PO4 in AM. Pt received 84 Meq of K yesterday, K increased from 3.0 to 3.3. Patient may need maintenance K. Will start K BID and recheck K in AM.  10/56 AM K+ 3., PO4 WNL. 20 mEq x1 ordered. Recheck in AM.  10/7 AM  K+ 3.5. No new orders entered. Recheck in AM.  Fulton Reek, PharmD, BCPS  07/23/2015

## 2015-07-24 LAB — BASIC METABOLIC PANEL
Anion gap: 7 (ref 5–15)
BUN: 18 mg/dL (ref 6–20)
CO2: 21 mmol/L — ABNORMAL LOW (ref 22–32)
Calcium: 7.5 mg/dL — ABNORMAL LOW (ref 8.9–10.3)
Chloride: 107 mmol/L (ref 101–111)
Creatinine, Ser: 0.68 mg/dL (ref 0.44–1.00)
GFR calc Af Amer: >60 mL/min (ref >60)
GFR calc non Af Amer: >60 mL/min (ref >60)
Glucose, Bld: 69 mg/dL (ref 65–99)
Potassium: 4.3 mmol/L (ref 3.5–5.1)
Sodium: 135 mmol/L (ref 135–145)

## 2015-07-24 MED ORDER — POTASSIUM CHLORIDE CRYS ER 20 MEQ PO TBCR
20.0000 meq | EXTENDED_RELEASE_TABLET | Freq: Every day | ORAL | Status: DC
  Administered 2015-07-24 – 2015-07-25 (×2): 20 meq via ORAL
  Filled 2015-07-24 (×2): qty 1

## 2015-07-24 NOTE — Progress Notes (Signed)
[facility] [place] [facility]   PATIENT NAME: [name]    MR#:  [id number]  DATE OF BIRTH:  June 05, 1946  SUBJECTIVE:   Much improved. No complaints. No shortness of breath. Slept well last night   REVIEW OF SYSTEMS:   Review of Systems  Constitutional: Negative for fever, chills and weight loss.  HENT: Negative for ear discharge, ear pain and nosebleeds.   Eyes: Negative for blurred vision, pain and discharge.  Respiratory: Negative for cough, sputum production, shortness of breath, wheezing and stridor.   Cardiovascular: Negative for chest pain, palpitations, orthopnea and PND.  Gastrointestinal: Negative for nausea, vomiting, abdominal pain and diarrhea.  Genitourinary: Negative for urgency and frequency.  Musculoskeletal: Negative for back pain and joint pain.  Neurological: Negative for sensory change, speech change, focal weakness and weakness.  Psychiatric/Behavioral: Negative for depression and hallucinations. The patient is not nervous/anxious.   All other systems reviewed and are negative.  Tolerating Diet: yes Tolerating PT: eval noted  DRUG ALLERGIES:   Allergies  Allergen Reactions  . Morphine And Related Diarrhea and Nausea And Vomiting   VITALS:  Blood pressure 98/54, pulse 117, temperature 98.6 F (37 C), temperature source Oral, resp. rate 18, height  (1.651 m), weight 50.213 kg (110 lb 11.2 oz), SpO2 97 %.  PHYSICAL EXAMINATION:   Physical Exam  GENERAL:  69 y.o.-year-old patient lying in the bed with   mild acute respiratory  distress.  EYES: Pupils equal, round, reactive to light and accommodation. No scleral icterus. Extraocular muscles intact.  HEENT: Head atraumatic, normocephalic. Oropharynx and nasopharynx clear.  NECK:  Supple, no jugular venous distention. No thyroid enlargement, no tenderness.  LUNGS: Decrease breath sounds bilaterally, no wheezing,no rales, no rhonchi. No use of accessory muscles of  respiration.  CARDIOVASCULAR: S1, S2 normal. No murmurs, rubs, or gallops.  ABDOMEN: Soft, nontender, nondistended. Bowel sounds present. No organomegaly or mass.  EXTREMITIES: No cyanosis, clubbing or edema b/l.   Bilateral above-the-knee and crepitations. Dressing on the left stump. NEUROLOGIC: Cranial nerves II through XII are intact. No focal Motor or sensory deficits b/l. PSYCHIATRIC: The patient is alert and oriented x 3.  Good affect SKIN: No obvious rash, lesion, or ulcer.    LABORATORY PANEL:   CBC  Recent Labs Lab 07/23/15 0443  WBC 18.8*  HGB 8.4*  HCT 25.4*  PLT 245    Chemistries   Recent Labs Lab 07/19/15 0520  07/24/15 0438  NA 137  < > 135  K 3.6  < > 4.3  CL 103  < > 107  CO2 30  < > 21*  GLUCOSE 102*  < > 69  BUN 18  < > 18  CREATININE 0.74  < > 0.68  CALCIUM 7.3*  < > 7.5*  MG 2.0  --   --   < > = values in this interval not displayed.  Cardiac Enzymes No results for input(s): TROPONINI in the last 168 hours.  RADIOLOGY:  No results found.   ASSESSMENT AND PLAN:   69 year old female admitted for arterial thrombus of the LLE.  #1. Septic shock with metabolic acidosis with PNA and prob LE infection  - Finished treatment with Levaquin. Hemodynamically stable. Afebrile.  #2. Left lower extremity with severe ischemia , elevated CK  -On admission LLE was pulseless, dusky, and very painful from severe ischemia now s/p left AKA day 5 -Pain better controlled now. - s/p angiogram and now for closure of  left leg amputation yesterday  #3. Acute on chronic blood loss anemia-hemoglobin stable and no acute bleeding presently. -Status post blood transfusion and hemoglobin is at 8.4--->6.3-->2nd unit--->8.4  #4. Acute  respiratory failure secondary to Pneumonia: community-acquired and acute systolic congestive heart failure with right sided pleural effusion -Patient was  intubated  on September 28 extubated 10/1 -completed has completed rx with Levaquin   -chest x-ray October 4 showed increasing edema and right pleural effusion.  - Was on IV Lasix bid and responded well and has been changed to po lasix qd for 10 days (total). -echo shows WMA and EF 40 -45% -appreciate dr harding's input--->out pt pt should follow with Dr Mariah Milling - cont. coreg and lisinopril  #5. Acute renal fialure likely ATN  - improved. Creat 0.7   #6. Tobacco abuse: - cont. Nicoderm patch   Case discussed with Care Management/Social Worker. Management plans discussed with the patient, family and they are in agreement.  CODE STATUS: full  DVT Prophylaxis: no antiplt agents in view of anemia  TOTAL TIME TAKING CARE OF THIS PATIENT: 25 minutes.   Likely discharge to home with home health on Monday   Houston Siren M.D on 07/24/2015 at 2:14 PM  Between 7am to 6pm - Pager - [phone]  After 6pm go to [email or web address] - password [email or web address] [facility]  [facility]  Office  [phone]  CC: Primary care physician; No primary care provider on file.

## 2015-07-24 NOTE — Progress Notes (Signed)
Called Dr. Elisabeth Pigeon in regards to patient request for medication for a sore throat.  Doctor ordered chloraseptic spray. [name] 07/24/2015  12:27 AM

## 2015-07-24 NOTE — Progress Notes (Signed)
[facility]  Daily Progress Note   Subjective  - 1 Day Post-Op  Doing well.  Not much pain.  Slept well  Objective Filed Vitals:   07/23/15 2033 07/24/15 0419 07/24/15 0553 07/24/15 0904  BP: 116/43 102/41  98/50  Pulse: 93 94    Temp: 98.2 F (36.8 C) 98 F (36.7 C)    TempSrc: Oral Oral    Resp: 22 18    Height:      Weight:   50.213 kg (110 lb 11.2 oz)   SpO2: 100% 97%      Intake/Output Summary (Last 24 hours) at 07/24/15 0931 Last data filed at 07/24/15 0800  Gross per 24 hour  Intake   2698 ml  Output   1000 ml  Net   1698 ml    PULM  CTAB CV  RRR VASC  AKA dressing C/D/I  Laboratory CBC    Component Value Date/Time   WBC 18.8* 07/23/2015 0443   WBC 10.4 02/12/2015 1641   HGB 8.4* 07/23/2015 0443   HGB 8.6* 02/12/2015 1641   HCT 25.4* 07/23/2015 0443   HCT 25.9* 02/12/2015 1641   PLT 245 07/23/2015 0443   PLT 343 02/12/2015 1641    BMET    Component Value Date/Time   NA 135 07/24/2015 0438   NA 131* 02/13/2015 0426   K 4.3 07/24/2015 0438   K 3.0* 02/13/2015 0426   CL 107 07/24/2015 0438   CL 103 02/13/2015 0426   CO2 21* 07/24/2015 0438   CO2 23 02/13/2015 0426   GLUCOSE 69 07/24/2015 0438   GLUCOSE 104* 02/13/2015 0426   BUN 18 07/24/2015 0438   BUN 10 02/13/2015 0426   CREATININE 0.68 07/24/2015 0438   CREATININE 0.83 02/13/2015 0426   CALCIUM 7.5* 07/24/2015 0438   CALCIUM 7.3* 02/13/2015 0426   GFRNONAA >60 07/24/2015 0438   GFRNONAA >60 02/13/2015 0426   GFRNONAA 51* 11/08/2014 0618   GFRAA >60 07/24/2015 0438   GFRAA >60 02/13/2015 0426   GFRAA >60 11/08/2014 0618    Assessment/Planning: POD #1 s/p completion of left AKA. POD 2 s/p percutaneous revascularization   Doing well  Slept well  Maybe take catheter out tomorrow am  PT to help with transfers  Likely OK to go to [facility] Monday    DEW,JASON  07/24/2015, 9:31 AM

## 2015-07-24 NOTE — Progress Notes (Signed)
PHARMACY - CRITICAL CARE PROGRESS NOTE  Pharmacy Consult for Electrolyte Management Indication: hypokalemia   Allergies  Allergen Reactions  . Morphine And Related Diarrhea and Nausea And Vomiting    Patient Measurements: Height:  (patient bilateral amputee since height last recorded) Weight: 106 lb 8 oz (48.308 kg) IBW/kg (Calculated) : 57 Adjusted Body Weight: n/a  Vital Signs: Temp: 98 F (36.7 C) (10/08 0419) Temp Source: Oral (10/08 0419) BP: 102/41 mmHg (10/08 0419) Pulse Rate: 94 (10/08 0419) Intake/Output from previous day: 10/07 0701 - 10/08 0700 In: 2458 [P.O.:10; I.V.:2398; IV Piggyback:50] Out: 600 [Urine:550; Blood:50] Intake/Output from this shift: Total I/O In: 1548 [I.V.:1498; IV Piggyback:50] Out: 50 [Urine:50] Vent settings for last 24 hours:    Labs:  Recent Labs  07/22/15 0509 07/23/15 0443 07/24/15 0438  WBC 13.6* 18.8*  --   HGB 6.3* 8.4*  --   HCT 19.8* 25.4*  --   PLT 238 245  --   APTT  --  33  --   INR  --  1.28  --   CREATININE 0.67 0.71 0.68  PHOS 3.7  --   --    Estimated Creatinine Clearance: 49.9 mL/min (by C-G formula based on Cr of 0.68).  No results for input(s): GLUCAP in the last 72 hours.  Microbiology: Recent Results (from the past 720 hour(s))  Culture, blood (routine x 2)     Status: None   Collection Time: 07/14/15  6:53 AM  Result Value Ref Range Status   Specimen Description BLOOD RIGHT ANTECUBITAL  Final   Special Requests BOTTLES DRAWN AEROBIC AND ANAEROBIC 5CC  Final   Culture NO GROWTH 5 DAYS  Final   Report Status 07/19/2015 FINAL  Final  Culture, blood (routine x 2)     Status: None   Collection Time: 07/14/15  6:53 AM  Result Value Ref Range Status   Specimen Description BLOOD RIGHT ANTECUBITAL  Final   Special Requests BOTTLES DRAWN AEROBIC AND ANAEROBIC 5CC  Final   Culture NO GROWTH 5 DAYS  Final   Report Status 07/19/2015 FINAL  Final  MRSA PCR Screening     Status: None   Collection Time:  07/14/15  8:27 PM  Result Value Ref Range Status   MRSA by PCR NEGATIVE NEGATIVE Final    Comment:        The GeneXpert MRSA Assay (FDA approved for NASAL specimens only), is one component of a comprehensive MRSA colonization surveillance program. It is not intended to diagnose MRSA infection nor to guide or monitor treatment for MRSA infections.   Culture, expectorated sputum-assessment     Status: None   Collection Time: 07/17/15  9:50 AM  Result Value Ref Range Status   Specimen Description SPU  Final   Special Requests NONE  Final   Sputum evaluation THIS SPECIMEN IS ACCEPTABLE FOR SPUTUM CULTURE  Final   Report Status 07/19/2015 FINAL  Final  Culture, respiratory (NON-Expectorated)     Status: None   Collection Time: 07/17/15  9:50 AM  Result Value Ref Range Status   Specimen Description SPU  Final   Special Requests NONE Reflexed from [id number]  Final   Gram Stain   Final    POOR SPECIMEN - LESS THAN 70% WBCS RARE WBC SEEN RARE GRAM POSITIVE COCCI    Culture HEAVY GROWTH CANDIDA SPECIES  Final   Report Status 07/21/2015 FINAL  Final    Medications:  Scheduled:  . aspirin  81 mg Oral Daily  .  atorvastatin  40 mg Oral q1800  . carvedilol  3.125 mg Oral BID WC  .  ceFAZolin (ANCEF) IV  1 g Intravenous 3 times per day  . famotidine  20 mg Oral Daily  . feeding supplement (ENSURE ENLIVE)  237 mL Oral BID BM  . furosemide  20 mg Oral Daily  . guaiFENesin  600 mg Oral BID  . Influenza vac split quadrivalent PF  0.5 mL Intramuscular Tomorrow-1000  . lisinopril  5 mg Oral QHS  . mometasone-formoterol  2 puff Inhalation BID  . potassium chloride  20 mEq Oral Daily  . senna-docusate  1 tablet Oral BID  . sodium chloride  3 mL Intravenous Q12H  . tiotropium  18 mcg Inhalation Daily   Infusions:  . sodium chloride 75 mL/hr at 07/23/15 0507    Assessment: Patient is hypokalemic with Potassium level of 2.7. Received KCl PO once this morning. Magnesium was checked  on 10/1 and resulted within normal limits.  Goal of Therapy:  Maintain electrolytes in norma range.  Plan:  Will order KCl boluses of IV x 4. Will recheck potassium level and magnesium level tonight at 21:00. Will follow up on levels and order as indicated.  1002 2152 potassium low, magnesium WNL. Ordered KCl 10 mEq IV x 5 doses and will recheck labs tomorrow morning.   1003 0520 K=3.6 Mg=2.0 Phos=1.9  K and Mg are WNL, phos is low. Will give of Potassium phosphate once. Will recheck level a few hours after infusion.   1004 - last night(1003 2001) K = 3.0; Ordered KCl IV x 4 doses this AM and will recheck labs this afternoon. 1004- last night phos (1003 2001) phos =2.3 Will give neutra phos-k 250 qid for 2 days. Will recheck phos 1006 AM 1004 1300 K= 3.4. Will give po tonight. Recheck in the AM 1005 AM K+ 3.3.  KCl 20 mEq x1 PO ordered. Pt is still receiving Neutra-Phos. BMP and PO4 in AM. Pt received 84 Meq of K yesterday, K increased from 3.0 to 3.3. Patient may need maintenance K. Will start K BID and recheck K in AM.  10/56 AM K+ 3., PO4 WNL. 20 mEq x1 ordered. Recheck in AM.  10/7 AM  K+ 3.5. No new orders entered. Recheck in AM.  10/8 AM K+ 4.3. Changed PO KCl to 20 mEq once daily. Recheck in AM.  Fulton Reek, PharmD, BCPS  07/24/2015

## 2015-07-24 NOTE — Plan of Care (Signed)
Problem: Phase I Progression Outcomes Goal: Voiding-avoid urinary catheter unless indicated Outcome: Not Progressing Patient currently has a urinary catheter

## 2015-07-25 LAB — BASIC METABOLIC PANEL
Anion gap: 5 (ref 5–15)
BUN: 14 mg/dL (ref 6–20)
CO2: 25 mmol/L (ref 22–32)
Calcium: 7.7 mg/dL — ABNORMAL LOW (ref 8.9–10.3)
Chloride: 104 mmol/L (ref 101–111)
Creatinine, Ser: 0.59 mg/dL (ref 0.44–1.00)
GFR calc Af Amer: >60 mL/min (ref >60)
GFR calc non Af Amer: >60 mL/min (ref >60)
Glucose, Bld: 95 mg/dL (ref 65–99)
Potassium: 3.9 mmol/L (ref 3.5–5.1)
Sodium: 134 mmol/L — ABNORMAL LOW (ref 135–145)

## 2015-07-25 LAB — CBC
HCT: 19.4 % — ABNORMAL LOW (ref 35.0–47.0)
Hemoglobin: 6.3 g/dL — ABNORMAL LOW (ref 12.0–16.0)
MCH: 28.7 pg (ref 26.0–34.0)
MCHC: 32.3 g/dL (ref 32.0–36.0)
MCV: 88.7 fL (ref 80.0–100.0)
Platelets: 233 10*3/uL (ref 150–440)
RBC: 2.19 MIL/uL — ABNORMAL LOW (ref 3.80–5.20)
RDW: 16.7 % — ABNORMAL HIGH (ref 11.5–14.5)
WBC: 16.2 10*3/uL — ABNORMAL HIGH (ref 3.6–11.0)

## 2015-07-25 LAB — PREPARE RBC (CROSSMATCH)

## 2015-07-25 MED ORDER — SODIUM CHLORIDE 0.9 % IV SOLN
Freq: Once | INTRAVENOUS | Status: AC
  Administered 2015-07-25: 13:00:00 via INTRAVENOUS

## 2015-07-25 NOTE — Progress Notes (Signed)
[facility] - [place] [facility] [place]   PATIENT NAME: [name]    MR#:  [id number]  DATE OF BIRTH:  Feb 05, 1946  SUBJECTIVE:   Hemoglobin down to 6.3 today. No acute bleeding overnight. No complaints presently.   REVIEW OF SYSTEMS:   Review of Systems  Constitutional: Negative for fever, chills and weight loss.  HENT: Negative for ear discharge, ear pain and nosebleeds.   Eyes: Negative for blurred vision, pain and discharge.  Respiratory: Negative for cough, sputum production, shortness of breath, wheezing and stridor.   Cardiovascular: Negative for chest pain, palpitations, orthopnea and PND.  Gastrointestinal: Negative for nausea, vomiting, abdominal pain and diarrhea.  Genitourinary: Negative for urgency and frequency.  Musculoskeletal: Negative for back pain and joint pain.  Neurological: Negative for sensory change, speech change, focal weakness and weakness.  Psychiatric/Behavioral: Negative for depression and hallucinations. The patient is not nervous/anxious.   All other systems reviewed and are negative.  Tolerating Diet: yes Tolerating PT: eval noted  DRUG ALLERGIES:   Allergies  Allergen Reactions  . Morphine And Related Diarrhea and Nausea And Vomiting   VITALS:  Blood pressure 91/51, pulse 90, temperature 98.2 F (36.8 C), temperature source Oral, resp. rate 18, height  (1.651 m), weight 50.531 kg (111 lb 6.4 oz), SpO2 95 %.  PHYSICAL EXAMINATION:   Physical Exam  GENERAL:  69 y.o.-year-old patient lying in the bed in no apparent distress  EYES: Pupils equal, round, reactive to light and accommodation. No scleral icterus. Extraocular muscles intact.  HEENT: Head atraumatic, normocephalic. Oropharynx and nasopharynx clear.  NECK:  Supple, no jugular venous distention. No thyroid enlargement, no tenderness.  LUNGS: Good air entry bilaterally, no wheezing,no rales, no rhonchi. No use of accessory muscles of respiration.   CARDIOVASCULAR: S1, S2 normal. No murmurs, rubs, or gallops.  ABDOMEN: Soft, nontender, nondistended. Bowel sounds present. No organomegaly or mass.  EXTREMITIES: No cyanosis, clubbing or edema b/l.   Bilateral above-the-knee and crepitations. Dressing on the left stump. NEUROLOGIC: Cranial nerves II through XII are intact. No focal Motor or sensory deficits b/l. PSYCHIATRIC: The patient is alert and oriented x 3.  Good affect SKIN: No obvious rash, lesion, or ulcer.    LABORATORY PANEL:   CBC  Recent Labs Lab 07/25/15 0540  WBC 16.2*  HGB 6.3*  HCT 19.4*  PLT 233    Chemistries   Recent Labs Lab 07/19/15 0520  07/25/15 0540  NA 137  < > 134*  K 3.6  < > 3.9  CL 103  < > 104  CO2 30  < > 25  GLUCOSE 102*  < > 95  BUN 18  < > 14  CREATININE 0.74  < > 0.59  CALCIUM 7.3*  < > 7.7*  MG 2.0  --   --   < > = values in this interval not displayed.  Cardiac Enzymes No results for input(s): TROPONINI in the last 168 hours.  RADIOLOGY:  No results found.   ASSESSMENT AND PLAN:   69 year old female admitted for arterial thrombus of the LLE.  #1. Septic shock with metabolic acidosis with PNA and prob LE infection  - Finished treatment with Levaquin. Hemodynamically stable. Afebrile.  #2. 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  -Pain well controlled now. - s/p angiogram and now for closure  of left leg amputation   #3. Acute on chronic blood loss anemia-hemoglobin down to 6.3 today. -We'll transfuse 1 unit of packed red blood cells and follow hemoglobin. No evidence of acute bleeding presently.  #4. Acute  respiratory failure secondary to Pneumonia: community-acquired and acute systolic congestive heart failure with right sided pleural effusion -Patient was  intubated on September 28 extubated 10/1 - has completed rx with  Levaquin  -chest x-ray October 4 showed increasing edema and right pleural effusion.  - Was on IV Lasix bid and responded well and has been changed to po lasix qd for 10 days (total). -echo shows WMA and EF 40 -45% -appreciate dr harding's input--->out pt pt should follow with Dr Mariah Milling - cont. coreg and lisinopril  #5. Acute renal fialure likely ATN  - improved and creatinine back to baseline.  #6. Tobacco abuse: - cont. Nicoderm patch   Case discussed with Care Management/Social Worker. Management plans discussed with the patient, family and they are in agreement.  CODE STATUS: full  DVT Prophylaxis: no antiplt agents in view of anemia  TOTAL TIME TAKING CARE OF THIS PATIENT: 25 minutes.   Likely discharge to home with home health on Monday   Houston Siren M.D on 07/25/2015 at 12:07 PM  Between 7am to 6pm - Pager - [phone]  After 6pm go to [email or web address] - password [email or web address] [facility]  [place] [facility]  Office  [phone]  CC: Primary care physician; No primary care provider on file.

## 2015-07-25 NOTE — Progress Notes (Signed)
PHARMACY - CRITICAL CARE PROGRESS NOTE  Pharmacy Consult for Electrolyte Management Indication: hypokalemia   Allergies  Allergen Reactions  . Morphine And Related Diarrhea and Nausea And Vomiting    Patient Measurements: Height:  (patient bilateral amputee since height last recorded) Weight: 111 lb 6.4 oz (50.531 kg) IBW/kg (Calculated) : 57 Adjusted Body Weight: n/a  Vital Signs: Temp: 98.2 F (36.8 C) (10/09 0423) Temp Source: Oral (10/09 0423) BP: 91/51 mmHg (10/09 0423) Pulse Rate: 90 (10/09 0423) Intake/Output from previous day: 10/08 0701 - 10/09 0700 In: 970 [P.O.:720; I.V.:250] Out: 1250 [Urine:1250] Intake/Output from this shift: Total I/O In: 0  Out: 1000 [Urine:1000] Vent settings for last 24 hours:    Labs:  Recent Labs  07/23/15 0443 07/24/15 0438 07/25/15 0540  WBC 18.8*  --  16.2*  HGB 8.4*  --  6.3*  HCT 25.4*  --  19.4*  PLT 245  --  233  APTT 33  --   --   INR 1.28  --   --   CREATININE 0.71 0.68 0.59   Estimated Creatinine Clearance: 52.2 mL/min (by C-G formula based on Cr of 0.59).  No results for input(s): GLUCAP in the last 72 hours.  Microbiology: Recent Results (from the past 720 hour(s))  Culture, blood (routine x 2)     Status: None   Collection Time: 07/14/15  6:53 AM  Result Value Ref Range Status   Specimen Description BLOOD RIGHT ANTECUBITAL  Final   Special Requests BOTTLES DRAWN AEROBIC AND ANAEROBIC 5CC  Final   Culture NO GROWTH 5 DAYS  Final   Report Status 07/19/2015 FINAL  Final  Culture, blood (routine x 2)     Status: None   Collection Time: 07/14/15  6:53 AM  Result Value Ref Range Status   Specimen Description BLOOD RIGHT ANTECUBITAL  Final   Special Requests BOTTLES DRAWN AEROBIC AND ANAEROBIC 5CC  Final   Culture NO GROWTH 5 DAYS  Final   Report Status 07/19/2015 FINAL  Final  MRSA PCR Screening     Status: None   Collection Time: 07/14/15  8:27 PM  Result Value Ref Range Status   MRSA by PCR NEGATIVE  NEGATIVE Final    Comment:        The GeneXpert MRSA Assay (FDA approved for NASAL specimens only), is one component of a comprehensive MRSA colonization surveillance program. It is not intended to diagnose MRSA infection nor to guide or monitor treatment for MRSA infections.   Culture, expectorated sputum-assessment     Status: None   Collection Time: 07/17/15  9:50 AM  Result Value Ref Range Status   Specimen Description SPU  Final   Special Requests NONE  Final   Sputum evaluation THIS SPECIMEN IS ACCEPTABLE FOR SPUTUM CULTURE  Final   Report Status 07/19/2015 FINAL  Final  Culture, respiratory (NON-Expectorated)     Status: None   Collection Time: 07/17/15  9:50 AM  Result Value Ref Range Status   Specimen Description SPU  Final   Special Requests NONE Reflexed from [id number]  Final   Gram Stain   Final    POOR SPECIMEN - LESS THAN 70% WBCS RARE WBC SEEN RARE GRAM POSITIVE COCCI    Culture HEAVY GROWTH CANDIDA SPECIES  Final   Report Status 07/21/2015 FINAL  Final    Medications:  Scheduled:  . aspirin  81 mg Oral Daily  . atorvastatin  40 mg Oral q1800  . carvedilol  3.125 mg Oral BID  WC  . famotidine  20 mg Oral Daily  . feeding supplement (ENSURE ENLIVE)  237 mL Oral BID BM  . furosemide  20 mg Oral Daily  . guaiFENesin  600 mg Oral BID  . Influenza vac split quadrivalent PF  0.5 mL Intramuscular Tomorrow-1000  . lisinopril  5 mg Oral QHS  . mometasone-formoterol  2 puff Inhalation BID  . potassium chloride  20 mEq Oral Daily  . senna-docusate  1 tablet Oral BID  . sodium chloride  3 mL Intravenous Q12H  . tiotropium  18 mcg Inhalation Daily   Infusions:     Assessment: Patient is hypokalemic with Potassium level of 2.7. Received KCl PO once this morning. Magnesium was checked on 10/1 and resulted within normal limits.  Goal of Therapy:  Maintain electrolytes in norma range.  Plan:  Will order KCl boluses of IV x 4. Will recheck potassium  level and magnesium level tonight at 21:00. Will follow up on levels and order as indicated.  1002 2152 potassium low, magnesium WNL. Ordered KCl 10 mEq IV x 5 doses and will recheck labs tomorrow morning.   1003 0520 K=3.6 Mg=2.0 Phos=1.9  K and Mg are WNL, phos is low. Will give of Potassium phosphate once. Will recheck level a few hours after infusion.   1004 - last night(1003 2001) K = 3.0; Ordered KCl IV x 4 doses this AM and will recheck labs this afternoon. 1004- last night phos (1003 2001) phos =2.3 Will give neutra phos-k 250 qid for 2 days. Will recheck phos 1006 AM 1004 1300 K= 3.4. Will give po tonight. Recheck in the AM 1005 AM K+ 3.3.  KCl 20 mEq x1 PO ordered. Pt is still receiving Neutra-Phos. BMP and PO4 in AM. Pt received 84 Meq of K yesterday, K increased from 3.0 to 3.3. Patient may need maintenance K. Will start K BID and recheck K in AM.  10/56 AM K+ 3., PO4 WNL. 20 mEq x1 ordered. Recheck in AM.  10/7 AM  K+ 3.5. No new orders entered. Recheck in AM.  10/8 AM K+ 4.3. Changed PO KCl to 20 mEq once daily. Recheck in AM.  10/9 AM K+ 3.9. Continue current regimen. BMP and Mg in AM.  Fulton Reek, PharmD, BCPS  07/25/2015

## 2015-07-26 ENCOUNTER — Telehealth: Payer: Self-pay

## 2015-07-26 ENCOUNTER — Encounter: Payer: Self-pay | Admitting: Vascular Surgery

## 2015-07-26 DIAGNOSIS — I739 Peripheral vascular disease, unspecified: Secondary | ICD-10-CM | POA: Diagnosis not present

## 2015-07-26 DIAGNOSIS — I10 Essential (primary) hypertension: Secondary | ICD-10-CM | POA: Diagnosis not present

## 2015-07-26 DIAGNOSIS — Z720 Tobacco use: Secondary | ICD-10-CM | POA: Diagnosis not present

## 2015-07-26 DIAGNOSIS — E784 Other hyperlipidemia: Secondary | ICD-10-CM | POA: Diagnosis not present

## 2015-07-26 DIAGNOSIS — J9601 Acute respiratory failure with hypoxia: Secondary | ICD-10-CM | POA: Diagnosis not present

## 2015-07-26 DIAGNOSIS — J9600 Acute respiratory failure, unspecified whether with hypoxia or hypercapnia: Secondary | ICD-10-CM | POA: Diagnosis not present

## 2015-07-26 DIAGNOSIS — Z4781 Encounter for orthopedic aftercare following surgical amputation: Secondary | ICD-10-CM | POA: Diagnosis not present

## 2015-07-26 DIAGNOSIS — R6521 Severe sepsis with septic shock: Secondary | ICD-10-CM | POA: Diagnosis not present

## 2015-07-26 DIAGNOSIS — M6281 Muscle weakness (generalized): Secondary | ICD-10-CM | POA: Diagnosis not present

## 2015-07-26 DIAGNOSIS — I5020 Unspecified systolic (congestive) heart failure: Secondary | ICD-10-CM | POA: Diagnosis not present

## 2015-07-26 DIAGNOSIS — S78112S Complete traumatic amputation at level between left hip and knee, sequela: Secondary | ICD-10-CM | POA: Diagnosis not present

## 2015-07-26 DIAGNOSIS — J441 Chronic obstructive pulmonary disease with (acute) exacerbation: Secondary | ICD-10-CM | POA: Diagnosis not present

## 2015-07-26 DIAGNOSIS — D62 Acute posthemorrhagic anemia: Secondary | ICD-10-CM | POA: Diagnosis not present

## 2015-07-26 DIAGNOSIS — Z89511 Acquired absence of right leg below knee: Secondary | ICD-10-CM | POA: Diagnosis not present

## 2015-07-26 DIAGNOSIS — K219 Gastro-esophageal reflux disease without esophagitis: Secondary | ICD-10-CM | POA: Diagnosis not present

## 2015-07-26 DIAGNOSIS — A419 Sepsis, unspecified organism: Secondary | ICD-10-CM | POA: Diagnosis not present

## 2015-07-26 DIAGNOSIS — J189 Pneumonia, unspecified organism: Secondary | ICD-10-CM | POA: Diagnosis not present

## 2015-07-26 DIAGNOSIS — F17210 Nicotine dependence, cigarettes, uncomplicated: Secondary | ICD-10-CM | POA: Diagnosis not present

## 2015-07-26 DIAGNOSIS — N179 Acute kidney failure, unspecified: Secondary | ICD-10-CM | POA: Diagnosis not present

## 2015-07-26 DIAGNOSIS — N184 Chronic kidney disease, stage 4 (severe): Secondary | ICD-10-CM | POA: Diagnosis not present

## 2015-07-26 LAB — TYPE AND SCREEN
ABO/RH(D): O POS
Antibody Screen: NEGATIVE
Unit division: 0

## 2015-07-26 LAB — BASIC METABOLIC PANEL
Anion gap: 5 (ref 5–15)
BUN: 15 mg/dL (ref 6–20)
CO2: 25 mmol/L (ref 22–32)
Calcium: 7.6 mg/dL — ABNORMAL LOW (ref 8.9–10.3)
Chloride: 105 mmol/L (ref 101–111)
Creatinine, Ser: 0.54 mg/dL (ref 0.44–1.00)
GFR calc Af Amer: >60 mL/min (ref >60)
GFR calc non Af Amer: >60 mL/min (ref >60)
Glucose, Bld: 96 mg/dL (ref 65–99)
Potassium: 3.7 mmol/L (ref 3.5–5.1)
Sodium: 135 mmol/L (ref 135–145)

## 2015-07-26 LAB — HEMOGLOBIN: Hemoglobin: 8.8 g/dL — ABNORMAL LOW (ref 12.0–16.0)

## 2015-07-26 LAB — MAGNESIUM: Magnesium: 1.6 mg/dL — ABNORMAL LOW (ref 1.7–2.4)

## 2015-07-26 MED ORDER — MAGNESIUM SULFATE 2 GM/50ML IV SOLN
2.0000 g | Freq: Once | INTRAVENOUS | Status: AC
  Administered 2015-07-26: 2 g via INTRAVENOUS
  Filled 2015-07-26: qty 50

## 2015-07-26 MED ORDER — ENSURE ENLIVE PO LIQD: 237.0000 mL | Freq: Two times a day (BID) | ORAL | Status: DC

## 2015-07-26 MED ORDER — OXYCODONE HCL 20 MG PO TABS: 20.0000 mg | ORAL_TABLET | ORAL | Status: DC | PRN

## 2015-07-26 MED ORDER — POTASSIUM CHLORIDE CRYS ER 20 MEQ PO TBCR: 20.0000 meq | EXTENDED_RELEASE_TABLET | ORAL | Status: DC

## 2015-07-26 MED ORDER — FUROSEMIDE 20 MG PO TABS: 20.0000 mg | ORAL_TABLET | ORAL | Status: DC

## 2015-07-26 NOTE — Telephone Encounter (Signed)
-----   Message from Christell Faith sent at 07/26/2015  9:22 AM EDT ----- Regarding: tcm/ph R Dunn 08/20/15 1:30

## 2015-07-26 NOTE — Progress Notes (Signed)
PHARMACY - CRITICAL CARE PROGRESS NOTE  Pharmacy Consult for Electrolyte Management Indication: hypokalemia   Allergies  Allergen Reactions  . Morphine And Related Diarrhea and Nausea And Vomiting    Patient Measurements: Height:  (patient bilateral amputee since height last recorded) Weight: 111 lb 6.4 oz (50.531 kg) IBW/kg (Calculated) : 57 Adjusted Body Weight: n/a  Vital Signs: Temp: 98.2 F (36.8 C) (10/10 0437) Temp Source: Oral (10/10 0437) BP: 94/45 mmHg (10/10 0437) Pulse Rate: 81 (10/10 0437) Intake/Output from previous day: 10/09 0701 - 10/10 0700 In: 1200 [P.O.:480; Blood:720] Out: 1200 [Urine:1200] Intake/Output from this shift: Total I/O In: 0  Out: 400 [Urine:400] Vent settings for last 24 hours:    Labs:  Recent Labs  07/24/15 0438 07/25/15 0540 07/26/15 0425  WBC  --  16.2*  --   HGB  --  6.3*  --   HCT  --  19.4*  --   PLT  --  233  --   CREATININE 0.68 0.59 0.54  MG  --   --  1.6*   Estimated Creatinine Clearance: 52.2 mL/min (by C-G formula based on Cr of 0.54).  No results for input(s): GLUCAP in the last 72 hours.  Microbiology: Recent Results (from the past 720 hour(s))  Culture, blood (routine x 2)     Status: None   Collection Time: 07/14/15  6:53 AM  Result Value Ref Range Status   Specimen Description BLOOD RIGHT ANTECUBITAL  Final   Special Requests BOTTLES DRAWN AEROBIC AND ANAEROBIC 5CC  Final   Culture NO GROWTH 5 DAYS  Final   Report Status 07/19/2015 FINAL  Final  Culture, blood (routine x 2)     Status: None   Collection Time: 07/14/15  6:53 AM  Result Value Ref Range Status   Specimen Description BLOOD RIGHT ANTECUBITAL  Final   Special Requests BOTTLES DRAWN AEROBIC AND ANAEROBIC 5CC  Final   Culture NO GROWTH 5 DAYS  Final   Report Status 07/19/2015 FINAL  Final  MRSA PCR Screening     Status: None   Collection Time: 07/14/15  8:27 PM  Result Value Ref Range Status   MRSA by PCR NEGATIVE NEGATIVE Final   Comment:        The GeneXpert MRSA Assay (FDA approved for NASAL specimens only), is one component of a comprehensive MRSA colonization surveillance program. It is not intended to diagnose MRSA infection nor to guide or monitor treatment for MRSA infections.   Culture, expectorated sputum-assessment     Status: None   Collection Time: 07/17/15  9:50 AM  Result Value Ref Range Status   Specimen Description SPU  Final   Special Requests NONE  Final   Sputum evaluation THIS SPECIMEN IS ACCEPTABLE FOR SPUTUM CULTURE  Final   Report Status 07/19/2015 FINAL  Final  Culture, respiratory (NON-Expectorated)     Status: None   Collection Time: 07/17/15  9:50 AM  Result Value Ref Range Status   Specimen Description SPU  Final   Special Requests NONE Reflexed from [id number]  Final   Gram Stain   Final    POOR SPECIMEN - LESS THAN 70% WBCS RARE WBC SEEN RARE GRAM POSITIVE COCCI    Culture HEAVY GROWTH CANDIDA SPECIES  Final   Report Status 07/21/2015 FINAL  Final    Medications:  Scheduled:  . aspirin  81 mg Oral Daily  . atorvastatin  40 mg Oral q1800  . carvedilol  3.125 mg Oral BID WC  .  famotidine  20 mg Oral Daily  . feeding supplement (ENSURE ENLIVE)  237 mL Oral BID BM  . furosemide  20 mg Oral Daily  . guaiFENesin  600 mg Oral BID  . Influenza vac split quadrivalent PF  0.5 mL Intramuscular Tomorrow-1000  . lisinopril  5 mg Oral QHS  . magnesium sulfate 1 - 4 g bolus IVPB  2 g Intravenous Once  . mometasone-formoterol  2 puff Inhalation BID  . potassium chloride  20 mEq Oral Daily  . senna-docusate  1 tablet Oral BID  . sodium chloride  3 mL Intravenous Q12H  . tiotropium  18 mcg Inhalation Daily   Infusions:     Assessment: Patient is hypokalemic with Potassium level of 2.7. Received KCl PO once this morning. Magnesium was checked on 10/1 and resulted within normal limits.  Goal of Therapy:  Maintain electrolytes in norma range.  Plan:  Will order KCl  boluses of IV x 4. Will recheck potassium level and magnesium level tonight at 21:00. Will follow up on levels and order as indicated.  1002 2152 potassium low, magnesium WNL. Ordered KCl 10 mEq IV x 5 doses and will recheck labs tomorrow morning.   1003 0520 K=3.6 Mg=2.0 Phos=1.9  K and Mg are WNL, phos is low. Will give of Potassium phosphate once. Will recheck level a few hours after infusion.   1004 - last night(1003 2001) K = 3.0; Ordered KCl IV x 4 doses this AM and will recheck labs this afternoon. 1004- last night phos (1003 2001) phos =2.3 Will give neutra phos-k 250 qid for 2 days. Will recheck phos 1006 AM 1004 1300 K= 3.4. Will give po tonight. Recheck in the AM 1005 AM K+ 3.3.  KCl 20 mEq x1 PO ordered. Pt is still receiving Neutra-Phos. BMP and PO4 in AM. Pt received 84 Meq of K yesterday, K increased from 3.0 to 3.3. Patient may need maintenance K. Will start K BID and recheck K in AM.  10/56 AM K+ 3., PO4 WNL. 20 mEq x1 ordered. Recheck in AM.  10/7 AM  K+ 3.5. No new orders entered. Recheck in AM.  10/8 AM K+ 4.3. Changed PO KCl to 20 mEq once daily. Recheck in AM.  10/9 AM K+ 3.9. Continue current regimen. BMP and Mg in AM.  10/10 AM K+ 3.7. Mg 1.6. Magnesium sulfate 2 grams IV x1 ordered. Recheck BMP and Mg in AM.  Fulton Reek, PharmD, BCPS  07/26/2015

## 2015-07-26 NOTE — Care Management (Signed)
Discharge plan has now changed to SNF/STR.  CSW following

## 2015-07-26 NOTE — Discharge Instructions (Signed)
HHRN/PT

## 2015-07-26 NOTE — Progress Notes (Signed)
Pt's dter is upset about pt going home. Pt declined rehab and is upset with her family who wants her to go to rehab. Pt is now agreeable for rehab She is medically stable for d/c today. Labs updated to pt and dter. D/w dter all the meds and f/u for pt at d/c CSW informed

## 2015-07-26 NOTE — Discharge Summary (Signed)
[facility] - [place] at [facility]   PATIENT NAME: [name]    MR#:  [id number]  DATE OF BIRTH:  1946/10/09  DATE OF ADMISSION:  07/14/2015 ADMITTING PHYSICIAN: Arnaldo Natal, MD  DATE OF DISCHARGE: 07/26/15  PRIMARY CARE PHYSICIAN: No primary care provider on file.    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 Peripheral vascular disease (HCC)   . 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 Tobacco abuse     HOSPITAL COURSE:  69 year old female admitted for arterial thrombus of the LLE.  #1. Septic shock with metabolic acidosis with PNA and prob LE infection  - Finished treatment with Levaquin. Hemodynamically stable. Afebrile.  #2. Left lower extremity with severe ischemia , elevated CK  -On admission LLE was pulseless, dusky, and very painful from severe ischemia now s/p left AKA post-op day 7 and doing well.  -Pain well controlled now. - s/p angiogram and now s/p closure of left leg amputation   #3. Acute on chronic blood loss anemia-hemoglobin down to 6.3  -hgb 8.8 - No evidence of acute bleeding presently. -s/p 3 unit BT  #4. Acute respiratory failure secondary to Pneumonia: community-acquired and acute systolic congestive heart failure with right sided pleural effusion -Patient was intubated on September 28 extubated 10/1 - has completed rx with Levaquin  -chest x-ray October 4 showed increasing edema and right pleural effusion.  - Was on IV Lasix  bid and responded well and has been changed to po lasix qod for now -echo shows WMA and EF 40 -45% -appreciate dr harding's input--->out pt pt should follow with Dr Mariah Milling - cont. coreg and lisinopril -sats 94% on RA  #5. Acute renal fialure likely ATN  - improved and creatinine back to baseline.  #6. Tobacco abuse: - cont. Nicoderm patch  Overall at baseline. D/c home with HHRN/PT Pt declines Rehab CONSULTS OBTAINED:  Treatment Team:  Annice Needy, MD Clydie Braun, MD  DRUG ALLERGIES:   Allergies  Allergen Reactions  . Morphine And Related Diarrhea and Nausea And Vomiting    DISCHARGE MEDICATIONS:   Current Discharge Medication List    START taking these medications   Details  atorvastatin (LIPITOR) 40 MG tablet Take 1 tablet (40 mg total) by mouth daily at 6 PM. Qty: 30 tablet, Refills: 0    carvedilol (COREG) 3.125 MG tablet Take 1 tablet (3.125 mg total) by mouth 2 (two) times daily with a meal. Qty: 60 tablet, Refills: 2    feeding supplement, ENSURE ENLIVE, (ENSURE ENLIVE) LIQD Take 237 mLs by mouth 2 (two) times daily between meals. Qty: 237 mL, Refills: 12    furosemide (LASIX) 20 MG tablet Take 1 tablet (20 mg total) by mouth every other day. Take fro 10 days ad then stop it Qty: 30 tablet, Refills: 0    lisinopril (PRINIVIL,ZESTRIL) 5 MG tablet Take 1 tablet (5 mg total) by mouth at bedtime. Qty: 30 tablet, Refills: 1    mometasone-formoterol (DULERA) 200-5 MCG/ACT AERO Inhale 2 puffs into the  lungs 2 (two) times daily. Qty: 1 Inhaler, Refills: 2    oxyCODONE 20 MG TABS Take 1 tablet (20 mg total) by mouth every 4 (four) hours as needed for severe pain. Qty: 30 tablet, Refills: 0    potassium chloride SA (K-DUR,KLOR-CON) 20 MEQ tablet Take 1 tablet (20 mEq total) by mouth every other day. Qty: 30 tablet, Refills: 0    tiotropium (SPIRIVA) 18 MCG inhalation capsule Place 1 capsule (18 mcg total) into inhaler and inhale daily. Qty: 30 capsule,  Refills: 12      CONTINUE these medications which have NOT CHANGED   Details  aspirin 81 MG tablet Take 81 mg by mouth daily.    famotidine (PEPCID) 20 MG tablet Take 20 mg by mouth daily.    magnesium hydroxide (MILK OF MAGNESIA) 400 MG/5ML suspension Take 30 mLs by mouth daily as needed for mild constipation.    warfarin (COUMADIN) 5 MG tablet Take 2.5-5 mg by mouth daily. Takes 2.5mg  on Monday, Wednesday and Friday. All other days take       STOP taking these medications     lisinopril-hydrochlorothiazide (PRINZIDE,ZESTORETIC) 20-12.5 MG tablet      oxyCODONE-acetaminophen (PERCOCET/ROXICET) 5-325 MG per tablet         If you experience worsening of your admission symptoms, develop shortness of breath, life threatening emergency, suicidal or homicidal thoughts you must seek medical attention immediately by calling 911 or calling your MD immediately  if symptoms less severe.  You Must read complete instructions/literature along with all the possible adverse reactions/side effects for all the Medicines you take and that have been prescribed to you. Take any new Medicines after you have completely understood and accept all the possible adverse reactions/side effects.   Please note  You were cared for by a hospitalist during your hospital stay. If you have any questions about your discharge medications or the care you received while you were in the hospital after you are discharged, you can call the unit and asked to speak with the hospitalist on call if the hospitalist that took care of you is not available. Once you are discharged, your primary care physician will handle any further medical issues. Please note that NO REFILLS for any discharge medications will be authorized once you are discharged, as it is imperative that you return to your primary care physician (or establish a relationship with a primary care physician if you do not have one) for your aftercare needs so that they  can reassess your need for medications and monitor your lab values. Today   SUBJECTIVE   Doing well  VITAL SIGNS:  Blood pressure 94/45, pulse 81, temperature 98.2 F (36.8 C), temperature source Oral, resp. rate 18, height  (1.651 m), weight 48.263 kg (106 lb 6.4 oz), SpO2 94 %.  I/O:   Intake/Output Summary (Last 24 hours) at 07/26/15 0824 Last data filed at 07/26/15 0603  Gross per 24 hour  Intake   1245 ml  Output    800 ml  Net    445 ml    PHYSICAL EXAMINATION:  GENERAL:  69 y.o.-year-old patient lying in the bed with no acute distress.  EYES: Pupils equal, round, reactive to light and accommodation. No scleral icterus. Extraocular muscles intact.  HEENT: Head atraumatic, normocephalic. Oropharynx and nasopharynx clear.  NECK:  Supple, no jugular venous distention. No thyroid enlargement, no tenderness.  LUNGS: Normal breath sounds bilaterally, no wheezing, rales,rhonchi or crepitation. No use of accessory muscles of respiration.  CARDIOVASCULAR: S1, S2 normal. No murmurs, rubs, or gallops.  ABDOMEN: Soft, non-tender, non-distended. Bowel sounds present. No organomegaly or mass.  EXTREMITIES: bilateral LE amputation. Stump dressing + NEUROLOGIC: Cranial nerves II through XII are intact. Muscle strength 5/5 in bothe upperl extremities. Sensation intact.  PSYCHIATRIC:  alert and oriented x 3.  SKIN: No obvious rash, lesion, or ulcer.   DATA REVIEW:   CBC   Recent Labs Lab 07/25/15 0540 07/26/15 0425  WBC 16.2*  --   HGB 6.3* 8.8*  HCT 19.4*  --   PLT 233  --     Chemistries   Recent Labs Lab 07/26/15 0425  NA 135  K 3.7  CL 105  CO2 25  GLUCOSE 96  BUN 15  CREATININE 0.54  CALCIUM 7.6*  MG 1.6*    Microbiology Results   Recent Results (from the past 240 hour(s))  Culture, expectorated sputum-assessment     Status: None   Collection Time: 07/17/15  9:50 AM  Result Value Ref Range Status   Specimen Description SPU  Final   Special  Requests NONE  Final   Sputum evaluation THIS SPECIMEN IS ACCEPTABLE FOR SPUTUM CULTURE  Final   Report Status 07/19/2015 FINAL  Final  Culture, respiratory (NON-Expectorated)     Status: None   Collection Time: 07/17/15  9:50 AM  Result Value Ref Range Status   Specimen Description SPU  Final   Special Requests NONE Reflexed from [id number]  Final   Gram Stain   Final    POOR SPECIMEN - LESS THAN 70% WBCS RARE WBC SEEN RARE GRAM POSITIVE COCCI    Culture HEAVY GROWTH CANDIDA SPECIES  Final   Report Status 07/21/2015 FINAL  Final    RADIOLOGY:  No results found.   Management plans discussed with the patient, family and they are in agreement.  CODE STATUS:     Code Status Orders        Start     Ordered   07/16/15 1855  Full code   Continuous     07/16/15 1855    Advance Directive Documentation        Most Recent Value   Type of Advance Directive  Healthcare Power of Attorney, Living will   Pre-existing out of facility DNR order (yellow form or pink MOST form)     "MOST" Form in Place?        TOTAL TIME TAKING CARE OF THIS PATIENT: 40 minutes.    Monserratt Knezevic M.D on 07/26/2015 at 8:24 AM  Between 7am to 6pm - Pager - [phone] After 6pm go to [email or web address] - password [email or web address] [facility]  [facility]  Office  [phone]  CC: Primary care physician; No primary care provider on file.

## 2015-07-26 NOTE — Telephone Encounter (Signed)
Attempted to contact pt regarding d/c from [facility] on 07/26/15. Message on phone states that pt is not receiving calls at this time.  Alternate phone # is disconnected. Unable to leave message.

## 2015-07-26 NOTE — Care Management Important Message (Signed)
Important Message  Patient Details  Name: [name] MRN: [id number] Date of Birth: 10-08-1946   Medicare Important Message Given:  Other (see comment) (IM given)    Adonis Huguenin, RN 07/26/2015, 12:56 PM

## 2015-07-26 NOTE — Clinical Social Work Placement (Signed)
   CLINICAL SOCIAL WORK PLACEMENT  NOTE  Date:  07/26/2015  Patient Details  Name: [name] MRN: [id number] Date of Birth: [birth year]  Clinical Social Work is seeking post-discharge placement for this patient at the Skilled  Nursing Facility level of care (*CSW will initial, date and re-position this form in  chart as items are completed):  Yes   Patient/family provided with [place] Clinical Social Work Department's list of facilities offering this level of care within the geographic area requested by the patient (or if unable, by the patient's family).  Yes   Patient/family informed of their freedom to choose among providers that offer the needed level of care, that participate in Medicare, Medicaid or managed care program needed by the patient, have an available bed and are willing to accept the patient.  Yes   Patient/family informed of [place]'s ownership interest in [facility] and [facility], as well as of the fact that they are under no obligation to receive care at these facilities.  PASRR submitted to EDS on 07/26/15     PASRR number received on 07/26/15     Existing PASRR number confirmed on       FL2 transmitted to all facilities in geographic area requested by pt/family on 07/26/15     FL2 transmitted to all facilities within larger geographic area on       Patient informed that his/her managed care company has contracts with or will negotiate with certain facilities, including the following:        Yes   Patient/family informed of bed offers received.  Patient chooses bed at  [facility])     Physician recommends and patient chooses bed at  [facility])    Patient to be transferred to  [facility]) on 07/26/15.  Patient to be transferred to facility by  (EMS)     Patient family notified on   of transfer.  Name of family member notified:   (Patient alert and oriented X4)     PHYSICIAN       Additional Comment:     _______________________________________________ York Spaniel, LCSW 07/26/2015, 10:20 AM

## 2015-07-27 ENCOUNTER — Encounter: Payer: Medicare Other | Admitting: Physical Therapy

## 2015-07-27 LAB — SURGICAL PATHOLOGY

## 2015-07-27 NOTE — Anesthesia Postprocedure Evaluation (Signed)
  Anesthesia Post-op Note  Patient: [name]  Procedure(s) Performed: Procedure(s):   WOUND CLOSURE (Left)  Anesthesia type:General  Patient location: PACU  Post pain: Pain level controlled  Post assessment: Post-op Vital signs reviewed, Patient's Cardiovascular Status Stable, Respiratory Function Stable, Patent Airway and No signs of Nausea or vomiting  Post vital signs: Reviewed and stable  Last Vitals:  Filed Vitals:   07/26/15 1200  BP: 110/55  Pulse: 86  Temp: 36.9 C  Resp: 16    Level of consciousness: awake, alert  and patient cooperative  Complications: No apparent anesthesia complications

## 2015-07-29 ENCOUNTER — Encounter: Payer: Medicare Other | Admitting: Physical Therapy

## 2015-07-30 DIAGNOSIS — I739 Peripheral vascular disease, unspecified: Secondary | ICD-10-CM | POA: Diagnosis not present

## 2015-07-30 DIAGNOSIS — R6521 Severe sepsis with septic shock: Secondary | ICD-10-CM | POA: Diagnosis not present

## 2015-07-30 DIAGNOSIS — A419 Sepsis, unspecified organism: Secondary | ICD-10-CM | POA: Diagnosis not present

## 2015-07-30 DIAGNOSIS — N179 Acute kidney failure, unspecified: Secondary | ICD-10-CM | POA: Diagnosis not present

## 2015-08-02 ENCOUNTER — Encounter: Payer: Self-pay | Admitting: Physical Therapy

## 2015-08-03 ENCOUNTER — Encounter: Payer: Medicare Other | Admitting: Physical Therapy

## 2015-08-04 ENCOUNTER — Encounter: Payer: Self-pay | Admitting: Physical Therapy

## 2015-08-05 ENCOUNTER — Encounter: Payer: Medicare Other | Admitting: Physical Therapy

## 2015-08-15 DIAGNOSIS — Z89612 Acquired absence of left leg above knee: Secondary | ICD-10-CM | POA: Diagnosis not present

## 2015-08-15 DIAGNOSIS — Z792 Long term (current) use of antibiotics: Secondary | ICD-10-CM | POA: Diagnosis not present

## 2015-08-15 DIAGNOSIS — I130 Hypertensive heart and chronic kidney disease with heart failure and stage 1 through stage 4 chronic kidney disease, or unspecified chronic kidney disease: Secondary | ICD-10-CM | POA: Diagnosis not present

## 2015-08-15 DIAGNOSIS — J441 Chronic obstructive pulmonary disease with (acute) exacerbation: Secondary | ICD-10-CM | POA: Diagnosis not present

## 2015-08-15 DIAGNOSIS — J189 Pneumonia, unspecified organism: Secondary | ICD-10-CM | POA: Diagnosis not present

## 2015-08-15 DIAGNOSIS — I5020 Unspecified systolic (congestive) heart failure: Secondary | ICD-10-CM | POA: Diagnosis not present

## 2015-08-15 DIAGNOSIS — Z4781 Encounter for orthopedic aftercare following surgical amputation: Secondary | ICD-10-CM | POA: Diagnosis not present

## 2015-08-15 DIAGNOSIS — J440 Chronic obstructive pulmonary disease with acute lower respiratory infection: Secondary | ICD-10-CM | POA: Diagnosis not present

## 2015-08-15 DIAGNOSIS — I7389 Other specified peripheral vascular diseases: Secondary | ICD-10-CM | POA: Diagnosis not present

## 2015-08-15 DIAGNOSIS — Z89611 Acquired absence of right leg above knee: Secondary | ICD-10-CM | POA: Diagnosis not present

## 2015-08-15 DIAGNOSIS — F17210 Nicotine dependence, cigarettes, uncomplicated: Secondary | ICD-10-CM | POA: Diagnosis not present

## 2015-08-15 DIAGNOSIS — Z7901 Long term (current) use of anticoagulants: Secondary | ICD-10-CM | POA: Diagnosis not present

## 2015-08-15 DIAGNOSIS — N184 Chronic kidney disease, stage 4 (severe): Secondary | ICD-10-CM | POA: Diagnosis not present

## 2015-08-15 DIAGNOSIS — Z9181 History of falling: Secondary | ICD-10-CM | POA: Diagnosis not present

## 2015-08-17 DIAGNOSIS — J189 Pneumonia, unspecified organism: Secondary | ICD-10-CM | POA: Diagnosis not present

## 2015-08-17 DIAGNOSIS — I5020 Unspecified systolic (congestive) heart failure: Secondary | ICD-10-CM | POA: Diagnosis not present

## 2015-08-17 DIAGNOSIS — Z89611 Acquired absence of right leg above knee: Secondary | ICD-10-CM | POA: Diagnosis not present

## 2015-08-17 DIAGNOSIS — J440 Chronic obstructive pulmonary disease with acute lower respiratory infection: Secondary | ICD-10-CM | POA: Diagnosis not present

## 2015-08-17 DIAGNOSIS — I7389 Other specified peripheral vascular diseases: Secondary | ICD-10-CM | POA: Diagnosis not present

## 2015-08-17 DIAGNOSIS — F17210 Nicotine dependence, cigarettes, uncomplicated: Secondary | ICD-10-CM | POA: Diagnosis not present

## 2015-08-17 DIAGNOSIS — Z4781 Encounter for orthopedic aftercare following surgical amputation: Secondary | ICD-10-CM | POA: Diagnosis not present

## 2015-08-17 DIAGNOSIS — Z89612 Acquired absence of left leg above knee: Secondary | ICD-10-CM | POA: Diagnosis not present

## 2015-08-17 DIAGNOSIS — Z9181 History of falling: Secondary | ICD-10-CM | POA: Diagnosis not present

## 2015-08-17 DIAGNOSIS — N184 Chronic kidney disease, stage 4 (severe): Secondary | ICD-10-CM | POA: Diagnosis not present

## 2015-08-17 DIAGNOSIS — Z7901 Long term (current) use of anticoagulants: Secondary | ICD-10-CM | POA: Diagnosis not present

## 2015-08-17 DIAGNOSIS — J441 Chronic obstructive pulmonary disease with (acute) exacerbation: Secondary | ICD-10-CM | POA: Diagnosis not present

## 2015-08-17 DIAGNOSIS — I130 Hypertensive heart and chronic kidney disease with heart failure and stage 1 through stage 4 chronic kidney disease, or unspecified chronic kidney disease: Secondary | ICD-10-CM | POA: Diagnosis not present

## 2015-08-17 DIAGNOSIS — Z792 Long term (current) use of antibiotics: Secondary | ICD-10-CM | POA: Diagnosis not present

## 2015-08-19 DIAGNOSIS — F17210 Nicotine dependence, cigarettes, uncomplicated: Secondary | ICD-10-CM | POA: Diagnosis not present

## 2015-08-19 DIAGNOSIS — J441 Chronic obstructive pulmonary disease with (acute) exacerbation: Secondary | ICD-10-CM | POA: Diagnosis not present

## 2015-08-19 DIAGNOSIS — Z89611 Acquired absence of right leg above knee: Secondary | ICD-10-CM | POA: Diagnosis not present

## 2015-08-19 DIAGNOSIS — J440 Chronic obstructive pulmonary disease with acute lower respiratory infection: Secondary | ICD-10-CM | POA: Diagnosis not present

## 2015-08-19 DIAGNOSIS — Z89612 Acquired absence of left leg above knee: Secondary | ICD-10-CM | POA: Diagnosis not present

## 2015-08-19 DIAGNOSIS — Z792 Long term (current) use of antibiotics: Secondary | ICD-10-CM | POA: Diagnosis not present

## 2015-08-19 DIAGNOSIS — Z9181 History of falling: Secondary | ICD-10-CM | POA: Diagnosis not present

## 2015-08-19 DIAGNOSIS — I130 Hypertensive heart and chronic kidney disease with heart failure and stage 1 through stage 4 chronic kidney disease, or unspecified chronic kidney disease: Secondary | ICD-10-CM | POA: Diagnosis not present

## 2015-08-19 DIAGNOSIS — I7389 Other specified peripheral vascular diseases: Secondary | ICD-10-CM | POA: Diagnosis not present

## 2015-08-19 DIAGNOSIS — I5020 Unspecified systolic (congestive) heart failure: Secondary | ICD-10-CM | POA: Diagnosis not present

## 2015-08-19 DIAGNOSIS — Z4781 Encounter for orthopedic aftercare following surgical amputation: Secondary | ICD-10-CM | POA: Diagnosis not present

## 2015-08-19 DIAGNOSIS — N184 Chronic kidney disease, stage 4 (severe): Secondary | ICD-10-CM | POA: Diagnosis not present

## 2015-08-19 DIAGNOSIS — J189 Pneumonia, unspecified organism: Secondary | ICD-10-CM | POA: Diagnosis not present

## 2015-08-19 DIAGNOSIS — Z7901 Long term (current) use of anticoagulants: Secondary | ICD-10-CM | POA: Diagnosis not present

## 2015-08-20 ENCOUNTER — Encounter: Payer: Self-pay | Admitting: Cardiovascular Disease

## 2015-08-20 ENCOUNTER — Encounter: Payer: Medicare Other | Admitting: Physician Assistant

## 2015-08-20 ENCOUNTER — Ambulatory Visit (INDEPENDENT_AMBULATORY_CARE_PROVIDER_SITE_OTHER): Payer: Medicare Other | Admitting: Cardiovascular Disease

## 2015-08-20 VITALS — BP 120/70 | HR 96 | Ht 65.0 in | Wt 112.0 lb

## 2015-08-20 DIAGNOSIS — I7090 Unspecified atherosclerosis: Secondary | ICD-10-CM

## 2015-08-20 DIAGNOSIS — I5022 Chronic systolic (congestive) heart failure: Secondary | ICD-10-CM | POA: Diagnosis not present

## 2015-08-20 DIAGNOSIS — I749 Embolism and thrombosis of unspecified artery: Secondary | ICD-10-CM | POA: Diagnosis not present

## 2015-08-20 DIAGNOSIS — R0602 Shortness of breath: Secondary | ICD-10-CM | POA: Diagnosis not present

## 2015-08-20 DIAGNOSIS — Z720 Tobacco use: Secondary | ICD-10-CM

## 2015-08-20 NOTE — Progress Notes (Signed)
HPI  This is a 69 year old female who is here today for a follow-up visit after recent hospitalization and recently diagnosed cardiomyopathy. She has extensive history of peripheral arterial disease with bilateral above-the-knee amputation after extensive lower extremity revascularization since 2015. She has chronic medical conditions that include one functioning kidney, tobacco use and hyperlipidemia. She was hospitalized recently at [facility] with sepsis due to pneumonia and gangrene. She underwent left above-the-knee amputation. She had an echocardiogram done which showed an ejection fraction of 40-45% with hypokinesis of the anterior, anteroseptal and apical myocardium with pulmonary systolic pressure of 66 mmHg. Patient reports improvement in symptoms overall since hospital discharge. She denies any chest pain or shortness of breath but obviously she is currently wheelchair-bound. She is not aware of any previous cardiac history.  Allergies  Allergen Reactions  . Morphine And Related Diarrhea and Nausea And Vomiting     Current Outpatient Prescriptions on File Prior to Visit  Medication Sig Dispense Refill  . aspirin 81 MG tablet Take 81 mg by mouth daily.    Marland Kitchen atorvastatin (LIPITOR) 40 MG tablet Take 1 tablet (40 mg total) by mouth daily at 6 PM. 30 tablet 0  . famotidine (PEPCID) 20 MG tablet Take 20 mg by mouth daily.    . magnesium hydroxide (MILK OF MAGNESIA) 400 MG/5ML suspension Take 30 mLs by mouth daily as needed for mild constipation.    Marland Kitchen oxyCODONE 20 MG TABS Take 1 tablet (20 mg total) by mouth every 4 (four) hours as needed for severe pain. 30 tablet 0  . warfarin (COUMADIN) 5 MG tablet Take 2.5-5 mg by mouth daily. Takes 2.5mg  on Monday, Wednesday and Friday. All other days take      No current facility-administered medications on file prior to visit.     Past Medical History  Diagnosis Date  . Peripheral vascular disease (HCC)   . Unilateral small kidney     a. one  functional kidney  . Tobacco abuse   . Hypertension      Past Surgical History  Procedure Laterality Date  . Below knee leg amputation    . Stents left leg    . Amputation Left 07/16/2015    Procedure: AMPUTATION ABOVE KNEE;  Surgeon: Renford Dills, MD;  Location: [facility] ORS;  Service: Vascular;  Laterality: Left;  . Peripheral vascular catheterization N/A 07/22/2015    Procedure: Abdominal Aortogram w/Lower Extremity;  Surgeon: Annice Needy, MD;  Location: [facility] INVASIVE CV LAB;  Service: Cardiovascular;  Laterality: N/A;  . Peripheral vascular catheterization  07/22/2015    Procedure: Lower Extremity Intervention;  Surgeon: Annice Needy, MD;  Location: [facility] INVASIVE CV LAB;  Service: Cardiovascular;;  . Amputation Left 07/23/2015    Procedure:   WOUND CLOSURE;  Surgeon: Renford Dills, MD;  Location: [facility] ORS;  Service: Vascular;  Laterality: Left;     Family History  Problem Relation Age of Onset  . CAD Mother   . Hypertension Mother   . Stroke Mother   . CAD Father   . Hypertension Father      Social History   Social History  . Marital Status: Married    Spouse Name: N/A  . Number of Children: N/A  . Years of Education: N/A   Occupational History  . Not on file.   Social History Main Topics  . Smoking status: Former Smoker -- 0.50 packs/day for 55 years    Quit date: 01/20/2015  . Smokeless tobacco: Not on file  .  Alcohol Use: No  . Drug Use: No  . Sexual Activity: Not on file   Other Topics Concern  . Not on file   Social History Narrative      PHYSICAL EXAM   BP 120/70 mmHg  Pulse 96  Ht 5\' 5"  (1.651 m)  Wt 112 lb (50.803 kg)  BMI 18.64 kg/m2 Constitutional: She is oriented to person, place, and time. She appears well-developed and well-nourished. No distress.  HENT: No nasal discharge.  Head: Normocephalic and atraumatic.  Eyes: Pupils are equal and round. No discharge.  Neck: Normal range of motion. Neck supple. No JVD present. No  thyromegaly present.  Cardiovascular: Normal rate, regular rhythm, normal heart sounds. Exam reveals no gallop and no friction rub. No murmur heard.  Pulmonary/Chest: Effort normal and breath sounds normal. No stridor. No respiratory distress. She has no wheezes. She has no rales. She exhibits no tenderness.  Abdominal: Soft. Bowel sounds are normal. She exhibits no distension. There is no tenderness. There is no rebound and no guarding.  Musculoskeletal: Bilateral above-the-knee amputation.  Neurological: She is alert and oriented to person, place, and time. Coordination normal.  Skin: Skin is warm and dry. No rash noted. She is not diaphoretic. No erythema. No pallor.  Psychiatric: She has a normal mood and affect. Her behavior is normal. Judgment and thought content normal.     EKG: Normal sinus rhythm with nonspecific T wave changes.   ASSESSMENT AND PLAN

## 2015-08-20 NOTE — Assessment & Plan Note (Signed)
Smoking cessation was advised. 

## 2015-08-20 NOTE — Patient Instructions (Addendum)
Medication Instructions:  Your physician recommends that you continue on your current medications as directed. Please refer to the Current Medication list given to you today.   Labwork: none  Testing/Procedures:  Your physician has requested that you have a lexiscan myoview. For further information please visit [email or web address]. Please follow instruction sheet, as given.  [facility] MYOVIEW  Your caregiver has ordered a Stress Test with nuclear imaging. The purpose of this test is to evaluate the blood supply to your heart muscle. This procedure is referred to as a "Non-Invasive Stress Test." This is because other than having an IV started in your vein, nothing is inserted or "invades" your body. Cardiac stress tests are done to find areas of poor blood flow to the heart by determining the extent of coronary artery disease (CAD). Some patients exercise on a treadmill, which naturally increases the blood flow to your heart, while others who are  unable to walk on a treadmill due to physical limitations have a pharmacologic/chemical stress agent called Lexiscan . This medicine will mimic walking on a treadmill by temporarily increasing your coronary blood flow.   Please note: these test may take anywhere between 2-4 hours to complete  PLEASE REPORT TO [facility]TR[facility]  THE VOLUNTEERS AT THE FIRST DESK WILL DIRECT YOU WHERE TO GO  Date of Procedure:_____Tuesday, November 8, 8:30am_________________  Arrival Time for Procedure:_____8:15am_____________  Instructions regarding medication:    PLEASE NOTIFY THE OFFICE AT LEAST 24 HOURS IN ADVANCE IF YOU ARE UNABLE TO KEEP YOUR APPOINTMENT.  [phone] AND  PLEASE NOTIFY [facility]UCLEAR MEDIC[facility] AT [facility] AT LEAST 24 HOURS IN ADVANCE IF YOU ARE UNABLE TO KEEP YOUR APPOINTMENT. [phone]  How to prepare for your Myoview test:   Do not eat or drink after midnight  No caffeine for 24 hours prior to test  No smoking 24 hours prior to  test.  Your medication may be taken with water.  If your doctor stopped a medication because of this test, do not take that medication.  Ladies, please do not wear dresses.  Skirts or pants are appropriate. Please wear a short sleeve shirt.  No perfume, cologne or lotion.  Wear comfortable walking shoes. No heels!            Follow-Up: Your physician recommends that you schedule a follow-up appointment in: two months with Dr. Kirke CorinArida   Any Other Special Instructions Will Be Listed Below (If Applicable).     If you need a refill on your cardiac medications before your next appointment, please call your pharmacy.  Cardiac Nuclear Scanning A cardiac nuclear scan is used to check your heart for problems, such as the following:  A portion of the heart is not getting enough blood.  Part of the heart muscle has died, which happens with a heart attack.  The heart wall is not working normally.  In this test, a radioactive dye (tracer) is injected into your bloodstream. After the tracer has traveled to your heart, a scanning device is used to measure how much of the tracer is absorbed by or distributed to various areas of your heart. LET [facility] KNOW ABOUT:  Any allergies you have.  All medicines you are taking, including vitamins, herbs, eye drops, creams, and over-the-counter medicines.  Previous problems you or members of your family have had with the use of anesthetics.  Any blood disorders you have.  Previous surgeries you have had.  Medical conditions you have.  RISKS AND COMPLICATIONS Generally,  this is a safe procedure. However, as with any procedure, problems can occur. Possible problems include:   Serious chest pain.  Rapid heartbeat.  Sensation of warmth in your chest. This usually passes quickly. BEFORE THE PROCEDURE Ask your health care provider about changing or stopping your regular medicines. PROCEDURE This procedure is usually done  at a hospital and takes 2-4 hours.  An IV tube is inserted into one of your veins.  Your health care provider will inject a small amount of radioactive tracer through the tube.  You will then wait for 20-40 minutes while the tracer travels through your bloodstream.  You will lie down on an exam table so images of your heart can be taken. Images will be taken for about 15-20 minutes.  You will exercise on a treadmill or stationary bike. While you exercise, your heart activity will be monitored with an electrocardiogram (ECG), and your blood pressure will be checked.  If you are unable to exercise, you may be given a medicine to make your heart beat faster.  When blood flow to your heart has peaked, tracer will again be injected through the IV tube.  After 20-40 minutes, you will get back on the exam table and have more images taken of your heart.  When the procedure is over, your IV tube will be removed. AFTER THE PROCEDURE  You will likely be able to leave shortly after the test. Unless your health care provider tells you otherwise, you may return to your normal schedule, including diet, activities, and medicines.  Make sure you find out how and when you will get your test results.   This information is not intended to replace advice given to you by your health care provider. Make sure you discuss any questions you have with your health care provider.   Document Released: 10/27/2004 Document Revised: 10/07/2013 Document Reviewed: 09/10/2013 Elsevier Interactive Patient Education [employer]

## 2015-08-20 NOTE — Assessment & Plan Note (Signed)
The patient had a recent echocardiogram which showed an ejection fraction of 40-45% with wall motion abnormalities suggestive of prior LAD infarct with moderate to severe pulmonary hypertension. This was in the setting of acute illness and underlying sepsis. Obviously, she is at risk for underlying coronary artery disease. Thus, I recommended proceeding with a pharmacologic nuclear stress test. Given underlying chronic kidney disease, I would have a high threshold for cardiac catheterization especially that she does not seem to be having significant symptoms at the present time. Nonetheless, if ejection fraction is low, I will plan on treatment with a beta blocker and possibly an ACE inhibitor if renal function is stable.

## 2015-08-23 ENCOUNTER — Telehealth: Payer: Self-pay

## 2015-08-23 NOTE — Telephone Encounter (Signed)
See phone note for details of phone call. Cancelled lexi myoview with Abby in Nuc Med

## 2015-08-23 NOTE — Telephone Encounter (Signed)
Pt would like to cx stress test appt for tomorrow. States she has discussed with several different people and they advise her it is too soon to have done. Please call.

## 2015-08-23 NOTE — Telephone Encounter (Signed)
S/w pt who states she is cancelling lexi myoview tomorrow as she feels it is too soon "after all I've been through the past few weeks". States she has talked to several people who have advised her to wait. Reviewed reasons for test and offered to reschedule but pt states she will call us to reschedule. Forward to MD to make aware.

## 2015-08-25 DIAGNOSIS — F17210 Nicotine dependence, cigarettes, uncomplicated: Secondary | ICD-10-CM | POA: Diagnosis not present

## 2015-08-25 DIAGNOSIS — N184 Chronic kidney disease, stage 4 (severe): Secondary | ICD-10-CM | POA: Diagnosis not present

## 2015-08-25 DIAGNOSIS — I130 Hypertensive heart and chronic kidney disease with heart failure and stage 1 through stage 4 chronic kidney disease, or unspecified chronic kidney disease: Secondary | ICD-10-CM | POA: Diagnosis not present

## 2015-08-25 DIAGNOSIS — Z89612 Acquired absence of left leg above knee: Secondary | ICD-10-CM | POA: Diagnosis not present

## 2015-08-25 DIAGNOSIS — Z792 Long term (current) use of antibiotics: Secondary | ICD-10-CM | POA: Diagnosis not present

## 2015-08-25 DIAGNOSIS — I5020 Unspecified systolic (congestive) heart failure: Secondary | ICD-10-CM | POA: Diagnosis not present

## 2015-08-25 DIAGNOSIS — J189 Pneumonia, unspecified organism: Secondary | ICD-10-CM | POA: Diagnosis not present

## 2015-08-25 DIAGNOSIS — Z7901 Long term (current) use of anticoagulants: Secondary | ICD-10-CM | POA: Diagnosis not present

## 2015-08-25 DIAGNOSIS — Z89611 Acquired absence of right leg above knee: Secondary | ICD-10-CM | POA: Diagnosis not present

## 2015-08-25 DIAGNOSIS — Z4781 Encounter for orthopedic aftercare following surgical amputation: Secondary | ICD-10-CM | POA: Diagnosis not present

## 2015-08-25 DIAGNOSIS — J440 Chronic obstructive pulmonary disease with acute lower respiratory infection: Secondary | ICD-10-CM | POA: Diagnosis not present

## 2015-08-25 DIAGNOSIS — Z9181 History of falling: Secondary | ICD-10-CM | POA: Diagnosis not present

## 2015-08-25 DIAGNOSIS — J441 Chronic obstructive pulmonary disease with (acute) exacerbation: Secondary | ICD-10-CM | POA: Diagnosis not present

## 2015-08-25 DIAGNOSIS — I7389 Other specified peripheral vascular diseases: Secondary | ICD-10-CM | POA: Diagnosis not present

## 2015-08-26 DIAGNOSIS — M79609 Pain in unspecified limb: Secondary | ICD-10-CM | POA: Diagnosis not present

## 2015-08-26 DIAGNOSIS — I70262 Atherosclerosis of native arteries of extremities with gangrene, left leg: Secondary | ICD-10-CM | POA: Diagnosis not present

## 2015-09-02 DIAGNOSIS — J189 Pneumonia, unspecified organism: Secondary | ICD-10-CM | POA: Diagnosis not present

## 2015-09-02 DIAGNOSIS — J440 Chronic obstructive pulmonary disease with acute lower respiratory infection: Secondary | ICD-10-CM | POA: Diagnosis not present

## 2015-09-02 DIAGNOSIS — Z89612 Acquired absence of left leg above knee: Secondary | ICD-10-CM | POA: Diagnosis not present

## 2015-09-02 DIAGNOSIS — Z792 Long term (current) use of antibiotics: Secondary | ICD-10-CM | POA: Diagnosis not present

## 2015-09-02 DIAGNOSIS — Z4781 Encounter for orthopedic aftercare following surgical amputation: Secondary | ICD-10-CM | POA: Diagnosis not present

## 2015-09-02 DIAGNOSIS — I130 Hypertensive heart and chronic kidney disease with heart failure and stage 1 through stage 4 chronic kidney disease, or unspecified chronic kidney disease: Secondary | ICD-10-CM | POA: Diagnosis not present

## 2015-09-02 DIAGNOSIS — I7389 Other specified peripheral vascular diseases: Secondary | ICD-10-CM | POA: Diagnosis not present

## 2015-09-02 DIAGNOSIS — Z7901 Long term (current) use of anticoagulants: Secondary | ICD-10-CM | POA: Diagnosis not present

## 2015-09-02 DIAGNOSIS — F17210 Nicotine dependence, cigarettes, uncomplicated: Secondary | ICD-10-CM | POA: Diagnosis not present

## 2015-09-02 DIAGNOSIS — Z9181 History of falling: Secondary | ICD-10-CM | POA: Diagnosis not present

## 2015-09-02 DIAGNOSIS — J441 Chronic obstructive pulmonary disease with (acute) exacerbation: Secondary | ICD-10-CM | POA: Diagnosis not present

## 2015-09-02 DIAGNOSIS — N184 Chronic kidney disease, stage 4 (severe): Secondary | ICD-10-CM | POA: Diagnosis not present

## 2015-09-02 DIAGNOSIS — I5020 Unspecified systolic (congestive) heart failure: Secondary | ICD-10-CM | POA: Diagnosis not present

## 2015-09-02 DIAGNOSIS — Z89611 Acquired absence of right leg above knee: Secondary | ICD-10-CM | POA: Diagnosis not present

## 2015-09-15 DIAGNOSIS — I70262 Atherosclerosis of native arteries of extremities with gangrene, left leg: Secondary | ICD-10-CM | POA: Diagnosis not present

## 2015-09-15 DIAGNOSIS — I739 Peripheral vascular disease, unspecified: Secondary | ICD-10-CM | POA: Diagnosis not present

## 2015-09-23 DIAGNOSIS — Z792 Long term (current) use of antibiotics: Secondary | ICD-10-CM | POA: Diagnosis not present

## 2015-09-23 DIAGNOSIS — N184 Chronic kidney disease, stage 4 (severe): Secondary | ICD-10-CM | POA: Diagnosis not present

## 2015-09-23 DIAGNOSIS — J440 Chronic obstructive pulmonary disease with acute lower respiratory infection: Secondary | ICD-10-CM | POA: Diagnosis not present

## 2015-09-23 DIAGNOSIS — I130 Hypertensive heart and chronic kidney disease with heart failure and stage 1 through stage 4 chronic kidney disease, or unspecified chronic kidney disease: Secondary | ICD-10-CM | POA: Diagnosis not present

## 2015-09-23 DIAGNOSIS — Z9181 History of falling: Secondary | ICD-10-CM | POA: Diagnosis not present

## 2015-09-23 DIAGNOSIS — J189 Pneumonia, unspecified organism: Secondary | ICD-10-CM | POA: Diagnosis not present

## 2015-09-23 DIAGNOSIS — I5020 Unspecified systolic (congestive) heart failure: Secondary | ICD-10-CM | POA: Diagnosis not present

## 2015-09-23 DIAGNOSIS — Z89612 Acquired absence of left leg above knee: Secondary | ICD-10-CM | POA: Diagnosis not present

## 2015-09-23 DIAGNOSIS — F17210 Nicotine dependence, cigarettes, uncomplicated: Secondary | ICD-10-CM | POA: Diagnosis not present

## 2015-09-23 DIAGNOSIS — I7389 Other specified peripheral vascular diseases: Secondary | ICD-10-CM | POA: Diagnosis not present

## 2015-09-23 DIAGNOSIS — Z7901 Long term (current) use of anticoagulants: Secondary | ICD-10-CM | POA: Diagnosis not present

## 2015-09-23 DIAGNOSIS — Z4781 Encounter for orthopedic aftercare following surgical amputation: Secondary | ICD-10-CM | POA: Diagnosis not present

## 2015-09-23 DIAGNOSIS — J441 Chronic obstructive pulmonary disease with (acute) exacerbation: Secondary | ICD-10-CM | POA: Diagnosis not present

## 2015-09-23 DIAGNOSIS — Z89611 Acquired absence of right leg above knee: Secondary | ICD-10-CM | POA: Diagnosis not present

## 2015-10-15 DIAGNOSIS — I70223 Atherosclerosis of native arteries of extremities with rest pain, bilateral legs: Secondary | ICD-10-CM | POA: Diagnosis not present

## 2015-10-15 DIAGNOSIS — G546 Phantom limb syndrome with pain: Secondary | ICD-10-CM | POA: Diagnosis not present

## 2015-10-15 DIAGNOSIS — Z89619 Acquired absence of unspecified leg above knee: Secondary | ICD-10-CM | POA: Diagnosis not present

## 2015-10-15 DIAGNOSIS — F17210 Nicotine dependence, cigarettes, uncomplicated: Secondary | ICD-10-CM | POA: Diagnosis not present

## 2015-10-15 DIAGNOSIS — N181 Chronic kidney disease, stage 1: Secondary | ICD-10-CM | POA: Diagnosis not present

## 2015-10-15 DIAGNOSIS — Z0001 Encounter for general adult medical examination with abnormal findings: Secondary | ICD-10-CM | POA: Diagnosis not present

## 2015-10-15 DIAGNOSIS — I10 Essential (primary) hypertension: Secondary | ICD-10-CM | POA: Diagnosis not present

## 2015-10-19 ENCOUNTER — Other Ambulatory Visit: Payer: Self-pay | Admitting: Physician Assistant

## 2015-10-19 DIAGNOSIS — Z1231 Encounter for screening mammogram for malignant neoplasm of breast: Secondary | ICD-10-CM

## 2015-10-26 ENCOUNTER — Ambulatory Visit: Payer: Medicare Other | Admitting: Cardiovascular Disease

## 2015-11-04 ENCOUNTER — Ambulatory Visit: Payer: Medicare Other

## 2015-11-04 DIAGNOSIS — I70262 Atherosclerosis of native arteries of extremities with gangrene, left leg: Secondary | ICD-10-CM | POA: Diagnosis not present

## 2015-11-04 DIAGNOSIS — I70241 Atherosclerosis of native arteries of left leg with ulceration of thigh: Secondary | ICD-10-CM | POA: Diagnosis not present

## 2015-11-05 ENCOUNTER — Other Ambulatory Visit: Payer: Self-pay | Admitting: Vascular Surgery

## 2015-11-08 ENCOUNTER — Ambulatory Visit
Admission: RE | Admit: 2015-11-08 | Discharge: 2015-11-08 | Disposition: A | Discharge status: Home / Self Care | Payer: Medicare Other | Source: Ambulatory Visit | Attending: Vascular Surgery | Admitting: Vascular Surgery

## 2015-11-08 ENCOUNTER — Encounter
Admission: RE | Admit: 2015-11-08 | Discharge: 2015-11-08 | Disposition: A | Discharge status: Home / Self Care | Payer: Medicare Other | Source: Ambulatory Visit | Attending: Vascular Surgery | Admitting: Vascular Surgery

## 2015-11-08 ENCOUNTER — Ambulatory Visit: Payer: Medicare Other

## 2015-11-08 ENCOUNTER — Telehealth: Payer: Self-pay

## 2015-11-08 DIAGNOSIS — J849 Interstitial pulmonary disease, unspecified: Secondary | ICD-10-CM | POA: Insufficient documentation

## 2015-11-08 DIAGNOSIS — F17200 Nicotine dependence, unspecified, uncomplicated: Secondary | ICD-10-CM | POA: Insufficient documentation

## 2015-11-08 DIAGNOSIS — Z01818 Encounter for other preprocedural examination: Secondary | ICD-10-CM

## 2015-11-08 DIAGNOSIS — J449 Chronic obstructive pulmonary disease, unspecified: Secondary | ICD-10-CM | POA: Insufficient documentation

## 2015-11-08 DIAGNOSIS — I10 Essential (primary) hypertension: Secondary | ICD-10-CM | POA: Diagnosis not present

## 2015-11-08 DIAGNOSIS — I509 Heart failure, unspecified: Secondary | ICD-10-CM | POA: Insufficient documentation

## 2015-11-08 DIAGNOSIS — I2510 Atherosclerotic heart disease of native coronary artery without angina pectoris: Secondary | ICD-10-CM | POA: Insufficient documentation

## 2015-11-08 HISTORY — DX: Pneumonia, unspecified organism: J18.9

## 2015-11-08 LAB — TYPE AND SCREEN
ABO/RH(D): O POS
Antibody Screen: NEGATIVE

## 2015-11-08 LAB — CBC WITH DIFFERENTIAL/PLATELET
Basophils Absolute: 0 10*3/uL (ref 0–0.1)
Basophils Relative: 1 %
Eosinophils Absolute: 0.1 10*3/uL (ref 0–0.7)
Eosinophils Relative: 1 %
HCT: 34.2 % — ABNORMAL LOW (ref 35.0–47.0)
Hemoglobin: 10.8 g/dL — ABNORMAL LOW (ref 12.0–16.0)
Lymphocytes Relative: 19 %
Lymphs Abs: 1.7 10*3/uL (ref 1.0–3.6)
MCH: 24.8 pg — ABNORMAL LOW (ref 26.0–34.0)
MCHC: 31.7 g/dL — ABNORMAL LOW (ref 32.0–36.0)
MCV: 78.1 fL — ABNORMAL LOW (ref 80.0–100.0)
Monocytes Absolute: 0.4 10*3/uL (ref 0.2–0.9)
Monocytes Relative: 5 %
Neutro Abs: 6.9 10*3/uL — ABNORMAL HIGH (ref 1.4–6.5)
Neutrophils Relative %: 74 %
Platelets: 309 10*3/uL (ref 150–440)
RBC: 4.38 MIL/uL (ref 3.80–5.20)
RDW: 16.6 % — ABNORMAL HIGH (ref 11.5–14.5)
WBC: 9.2 10*3/uL (ref 3.6–11.0)

## 2015-11-08 LAB — SURGICAL PCR SCREEN
MRSA, PCR: NEGATIVE
Staphylococcus aureus: NEGATIVE

## 2015-11-08 LAB — BASIC METABOLIC PANEL
Anion gap: 8 (ref 5–15)
BUN: 19 mg/dL (ref 6–20)
CO2: 24 mmol/L (ref 22–32)
Calcium: 9.2 mg/dL (ref 8.9–10.3)
Chloride: 106 mmol/L (ref 101–111)
Creatinine, Ser: 0.61 mg/dL (ref 0.44–1.00)
GFR calc Af Amer: >60 mL/min (ref >60)
GFR calc non Af Amer: >60 mL/min (ref >60)
Glucose, Bld: 95 mg/dL (ref 65–99)
Potassium: 3.5 mmol/L (ref 3.5–5.1)
Sodium: 138 mmol/L (ref 135–145)

## 2015-11-08 NOTE — Pre-Procedure Instructions (Signed)
Pt's BP was elevated 197/90, 191/88 in left arm Repeated later in right arm 173/84.  Pt did not take her pain med this am concerned it would interfere with lab work to be drawn.  Pt's daughter is a CNA, recommend pt take her pain med when she gets home, wait at least 1 hour then recheck her BP, if it is still elevated she needs to follow up with her surgeon or PCP.

## 2015-11-08 NOTE — Pre-Procedure Instructions (Addendum)
Spoke with Golda Acre RN regarding pt, her medical history and elevated BP today possibly related to pain 8/10, pt did not take her pain med this am.  Due to recently diagnosed cardiomyopathy and her seeing Dr. Kirke Corin in 11/16, a cardiac clearance was requested. Noted pt cancelled the scheduled Pharmacological Nuclear Stress test.  Spoke with receptionist and she confirmed that the cardiac clearance was received at their office this afternoon and will be given to Dr. Hendricks Milo nurse, if she needs to be seen by Dr. Samuella Cota, their office will call pt for an appt.  A copy of the cardiac request was also sent to Dr. Marijean Heath office.

## 2015-11-08 NOTE — Telephone Encounter (Signed)
Received cardiac clearance for Feb 3 for revision left above knee amputation. Pt cancelled Nov 8 lexi myoview as well as 2 month f/u w/Dr. Kirke Corin. Informed pt that MD most likely will not clear her w/o test. Pt agreeable to lexi myoview on Jan 26, 8:30am. Reviewed myoview instructions w/pt.  Will call her w/follow up appt date tomorrow as scheduling is closed. Pt agreeable w/plan.

## 2015-11-08 NOTE — Patient Instructions (Signed)
  Your procedure is scheduled on: Friday November 19, 2015. Report to Same Day Surgery. To find out your arrival time please call [phone] between 1PM - 3PM on Thursday Feb. 2, 2017.  Remember: Instructions that are not followed completely may result in serious medical risk, up to and including death, or upon the discretion of your surgeon and anesthesiologist your surgery may need to be rescheduled.    _x___ 1. Do not eat food or drink liquids after midnight. No gum chewing or hard candies.     ____ 2. No Alcohol for 24 hours before or after surgery.   ____ 3. Bring all medications with you on the day of surgery if instructed.    __x__ 4. Notify your doctor if there is any change in your medical condition     (cold, fever, infections).     Do not wear jewelry, make-up, hairpins, clips or nail polish.  Do not wear lotions, powders, or perfumes. You may wear deodorant.  Do not shave 48 hours prior to surgery. Men may shave face and neck.  Do not bring valuables to the hospital.    [facility] is not responsible for any belongings or valuables.               Contacts, dentures or bridgework may not be worn into surgery.  Leave your suitcase in the car. After surgery it may be brought to your room.  For patients admitted to the hospital, discharge time is determined by your treatment team.   Patients discharged the day of surgery will not be allowed to drive home.    Please read over the following fact sheets that you were given:   [facility] Preparing for Surgery  __x__ Take these medicines the morning of surgery with A SIP OF WATER:    1. famotidine (PEPCID) also take at bedtime on 11/18/15  2. Hydrocodone if needed  ____ Fleet Enema (as directed)   __x__ Use CHG Soap as directed  ____ Use inhalers on the day of surgery  ____ Stop metformin 2 days prior to surgery    ____ Take 1/2 of usual insulin dose the night before surgery and none on the morning of surgery.    __x__ Stop Coumadin 5 days prior to surgery. Last dose Jan. 28, 2017.  __x_ Stop Anti-inflammatories on does not apply.  Tylenol or hydrocodone ok to take for pain.   ____ Stop supplements until after surgery.    ____ Bring C-Pap to the hospital.

## 2015-11-08 NOTE — Pre-Procedure Instructions (Signed)
Office called need H+P.

## 2015-11-09 NOTE — Telephone Encounter (Signed)
S/w pt of appt Jan 30 w/Dr. Kirke Corin. Pt agreeable w/plan. Confirmed Jan 26 lexi at [facility]

## 2015-11-11 ENCOUNTER — Encounter
Admission: RE | Admit: 2015-11-11 | Discharge: 2015-11-11 | Disposition: A | Discharge status: Home / Self Care | Payer: Medicare Other | Source: Ambulatory Visit | Attending: Cardiovascular Disease | Admitting: Cardiovascular Disease

## 2015-11-11 DIAGNOSIS — R0602 Shortness of breath: Secondary | ICD-10-CM | POA: Diagnosis not present

## 2015-11-11 LAB — NM MYOCAR MULTI W/SPECT W/WALL MOTION / EF
Estimated workload: 1 METS
Exercise duration (min): 0 min
Exercise duration (sec): 0 s
LV dias vol: 61 mL
LV sys vol: 42 mL
MPHR: 150 {beats}/min
Peak HR: 116 {beats}/min
Percent HR: 87 %
Percent of predicted max HR: 77 %
Rest HR: 89 {beats}/min
SDS: 0
SRS: 0
SSS: 5
Stage 1 Grade: 0 %
Stage 1 HR: 86 {beats}/min
Stage 1 Speed: 0 mph
Stage 2 Grade: 0 %
Stage 2 HR: 86 {beats}/min
Stage 2 Speed: 0 mph
Stage 3 Grade: 0 %
Stage 3 HR: 116 {beats}/min
Stage 3 Speed: 0 mph
Stage 4 Grade: 0 %
Stage 4 HR: 129 {beats}/min
Stage 4 Speed: 0 mph
Stage 5 Grade: 0 %
Stage 5 HR: 126 {beats}/min
Stage 5 Speed: 0 mph
TID: 0.74

## 2015-11-11 MED ORDER — TECHNETIUM TC 99M SESTAMIBI - CARDIOLITE
32.3600 | Freq: Once | INTRAVENOUS | Status: AC | PRN
  Administered 2015-11-11: 32.36 via INTRAVENOUS

## 2015-11-11 MED ORDER — TECHNETIUM TC 99M SESTAMIBI - CARDIOLITE
13.5800 | Freq: Once | INTRAVENOUS | Status: AC | PRN
  Administered 2015-11-11: 13.58 via INTRAVENOUS

## 2015-11-11 MED ORDER — REGADENOSON 0.4 MG/5ML IV SOLN
0.4000 mg | Freq: Once | INTRAVENOUS | Status: AC
  Administered 2015-11-11: 0.4 mg via INTRAVENOUS
  Filled 2015-11-11: qty 5

## 2015-11-11 MED ORDER — TECHNETIUM TC 99M SESTAMIBI - CARDIOLITE: 32.3600 | Freq: Once | INTRAVENOUS | Status: DC | PRN

## 2015-11-15 ENCOUNTER — Ambulatory Visit (INDEPENDENT_AMBULATORY_CARE_PROVIDER_SITE_OTHER): Payer: Medicare Other | Admitting: Cardiovascular Disease

## 2015-11-15 ENCOUNTER — Telehealth: Payer: Self-pay

## 2015-11-15 ENCOUNTER — Encounter: Payer: Self-pay | Admitting: Cardiovascular Disease

## 2015-11-15 VITALS — BP 154/80 | HR 85 | Ht 65.0 in | Wt 105.0 lb

## 2015-11-15 DIAGNOSIS — Z01810 Encounter for preprocedural cardiovascular examination: Secondary | ICD-10-CM | POA: Diagnosis not present

## 2015-11-15 DIAGNOSIS — I10 Essential (primary) hypertension: Secondary | ICD-10-CM | POA: Insufficient documentation

## 2015-11-15 DIAGNOSIS — I5022 Chronic systolic (congestive) heart failure: Secondary | ICD-10-CM

## 2015-11-15 DIAGNOSIS — Z0001 Encounter for general adult medical examination with abnormal findings: Secondary | ICD-10-CM | POA: Insufficient documentation

## 2015-11-15 DIAGNOSIS — I749 Embolism and thrombosis of unspecified artery: Secondary | ICD-10-CM | POA: Diagnosis not present

## 2015-11-15 DIAGNOSIS — I7090 Unspecified atherosclerosis: Secondary | ICD-10-CM

## 2015-11-15 MED ORDER — LISINOPRIL 20 MG PO TABS: 20.0000 mg | ORAL_TABLET | Freq: Every day | ORAL | Status: DC

## 2015-11-15 NOTE — Assessment & Plan Note (Signed)
The patient had cardiomyopathy in October in the setting of acute illness. However, recent nuclear stress test showed no evidence of ischemia with normal ejection fraction. She currently does not have convincing symptoms of angina or heart failure. Thus, I think she is at low risk for surgery from a cardiac standpoint.

## 2015-11-15 NOTE — Telephone Encounter (Signed)
Faxed clearance to PAT, [phone] 

## 2015-11-15 NOTE — Progress Notes (Signed)
HPI  This is a 70 year old female who is here today for a follow-up visit regarding cardiomyopathy and preoperative cardiovascular evaluation. She has extensive history of peripheral arterial disease with bilateral above-the-knee amputation after extensive lower extremity revascularization since 2015. She has chronic medical conditions that include one functioning kidney, tobacco use and hyperlipidemia.  Echocardiogram in October showed an ejection fraction of 40-45% with hypokinesis of the anterior, anteroseptal and apical myocardium with pulmonary systolic pressure of 66 mmHg. this was in the setting of sepsis and critical illness. She is wheelchair bound. I proceeded with a pharmacologic nuclear stress test which showed no evidence of ischemia with normal ejection fraction. The patient has been doing well and denies any chest pain or significant dyspnea. She is scheduled for revision of above-the-knee amputation on the left side later this week. Her blood pressure has been running high lately. She used to be on lisinopril-hydrochlorothiazide. This was discontinued during her hospitalization due to hypotension and acute renal failure. She had recent labs which showed normal kidney function and significant improvement of anemia With a hemoglobin of 10.8.  Allergies  Allergen Reactions  . Morphine And Related Diarrhea and Nausea And Vomiting     Current Outpatient Prescriptions on File Prior to Visit  Medication Sig Dispense Refill  . aspirin 81 MG tablet Take 81 mg by mouth every morning.     Marland Kitchen atorvastatin (LIPITOR) 40 MG tablet Take 1 tablet (40 mg total) by mouth daily at 6 PM. (Patient taking differently: Take 40 mg by mouth at bedtime. ) 30 tablet 0  . famotidine (PEPCID) 20 MG tablet Take 20 mg by mouth every morning.     Marland Kitchen HYDROcodone-acetaminophen (NORCO/VICODIN) 5-325 MG tablet Take 1 tablet by mouth every 4 (four) hours as needed.     . magnesium hydroxide (MILK OF MAGNESIA) 400  MG/5ML suspension Take 30 mLs by mouth daily as needed for mild constipation.    Marland Kitchen warfarin (COUMADIN) 2 MG tablet Take 2 mg by mouth as directed. 3 days a week Monday, Wednesday and Friday.    . warfarin (COUMADIN) 5 MG tablet Take 2.5-5 mg by mouth daily. Takes 2 mg on Monday, Wednesday and Friday. All other days take      No current facility-administered medications on file prior to visit.     Past Medical History  Diagnosis Date  . Peripheral vascular disease (HCC)   . 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 Tobacco abuse   . Hypertension   . Pneumonia 07/14/15     Past Surgical History  Procedure Laterality Date  . Stents left leg    . Amputation Left 07/16/2015    Procedure: AMPUTATION ABOVE KNEE;  Surgeon: Renford Dills, MD;  Location: [facility] ORS;  Service: Vascular;  Laterality: Left;  . Peripheral vascular catheterization N/A 07/22/2015    Procedure: Abdominal Aortogram w/Lower Extremity;  Surgeon: Annice Needy, MD;  Location: [facility] INVASIVE CV LAB;  Service: Cardiovascular;  Laterality: N/A;  . Peripheral vascular catheterization  07/22/2015    Procedure: Lower Extremity Intervention;  Surgeon: Annice Needy, MD;  Location: [facility] INVASIVE CV LAB;  Service: Cardiovascular;;  . Amputation Left 07/23/2015    Procedure:   WOUND CLOSURE;  Surgeon: Renford Dills, MD;  Location: [facility] ORS;  Service: Vascular;  Laterality: Left;  . Above knee leg amputation Right 01/2015  . Ectopic pregnancy surgery Right 1969  . Appendectomy  1969     Family History  Problem Relation Age of Onset  . CAD Mother   . Hypertension Mother   . Stroke Mother   . CAD Father   . Hypertension Father      Social History   Social History  . Marital Status: Married    Spouse Name: N/A  . Number of Children: N/A  . Years of Education: N/A   Occupational History  . Not on file.   Social History Main Topics  . Smoking status: Light Tobacco Smoker -- 55 years    Last Attempt  to Quit: 01/20/2015  . Smokeless tobacco: Not on file  . Alcohol Use: No  . Drug Use: No  . Sexual Activity: Not on file   Other Topics Concern  . Not on file   Social History Narrative      PHYSICAL EXAM   BP 154/80 mmHg  Pulse 85  Ht  (1.651 m)  Wt 105 lb (47.628 kg)  BMI 17.47 kg/m2 Constitutional: She is oriented to person, place, and time. She appears well-developed and well-nourished. No distress.  HENT: No nasal discharge.  Head: Normocephalic and atraumatic.  Eyes: Pupils are equal and round. No discharge.  Neck: Normal range of motion. Neck supple. No JVD present. No thyromegaly present.  Cardiovascular: Normal rate, regular rhythm, normal heart sounds. Exam reveals no gallop and no friction rub. No murmur heard.  Pulmonary/Chest: Effort normal and breath sounds normal. No stridor. No respiratory distress. She has no wheezes. She has no rales. She exhibits no tenderness.  Abdominal: Soft. Bowel sounds are normal. She exhibits no distension. There is no tenderness. There is no rebound and no guarding.  Musculoskeletal: Bilateral above-the-knee amputation.  Neurological: She is alert and oriented to person, place, and time. Coordination normal.  Skin: Skin is warm and dry. No rash noted. She is not diaphoretic. No erythema. No pallor.  Psychiatric: She has a normal mood and affect. Her behavior is normal. Judgment and thought content normal.     EKG: Normal sinus rhythm with  no significant ST or T wave changes.  ASSESSMENT AND PLAN

## 2015-11-15 NOTE — Patient Instructions (Signed)
Medication Instructions:  Your physician has recommended you make the following change in your medication:  START taking lisinopril  daily   Labwork: none  Testing/Procedures: none  Follow-Up: Your physician recommends that you schedule a follow-up appointment as needed.    Any Other Special Instructions Will Be Listed Below (If Applicable).     If you need a refill on your cardiac medications before your next appointment, please call your pharmacy.

## 2015-11-15 NOTE — Assessment & Plan Note (Signed)
The patient has known history of hypertension and her blood pressure is elevated today. She used to be on lisinopril-hydrochlorothiazide before her hospitalization in October. The medication was stopped due to hypotension and acute on chronic renal failure. These issues have resolved since then. I elected to resume lisinopril 20 mg once daily. I advised her to follow-up with her primary care physician on a regular basis. She can follow-up with [place] as needed.

## 2015-11-19 ENCOUNTER — Encounter: Payer: Self-pay | Admitting: *Deleted

## 2015-11-19 ENCOUNTER — Inpatient Hospital Stay
Admission: RE | Admit: 2015-11-19 | Discharge: 2015-11-20 | DRG: 475 | Disposition: A | Discharge status: Home / Self Care | Payer: Medicare Other | Source: Ambulatory Visit | Attending: Vascular Surgery | Admitting: Vascular Surgery

## 2015-11-19 ENCOUNTER — Encounter
Admission: RE | Disposition: A | Discharge status: Home / Self Care | Payer: Self-pay | Source: Ambulatory Visit | Attending: Vascular Surgery

## 2015-11-19 ENCOUNTER — Inpatient Hospital Stay: Payer: Medicare Other | Admitting: *Deleted

## 2015-11-19 DIAGNOSIS — K219 Gastro-esophageal reflux disease without esophagitis: Secondary | ICD-10-CM | POA: Diagnosis present

## 2015-11-19 DIAGNOSIS — E785 Hyperlipidemia, unspecified: Secondary | ICD-10-CM | POA: Diagnosis present

## 2015-11-19 DIAGNOSIS — F17210 Nicotine dependence, cigarettes, uncomplicated: Secondary | ICD-10-CM | POA: Diagnosis present

## 2015-11-19 DIAGNOSIS — M869 Osteomyelitis, unspecified: Secondary | ICD-10-CM | POA: Diagnosis not present

## 2015-11-19 DIAGNOSIS — D649 Anemia, unspecified: Secondary | ICD-10-CM | POA: Diagnosis not present

## 2015-11-19 DIAGNOSIS — Z89611 Acquired absence of right leg above knee: Secondary | ICD-10-CM

## 2015-11-19 DIAGNOSIS — Z79899 Other long term (current) drug therapy: Secondary | ICD-10-CM

## 2015-11-19 DIAGNOSIS — Z89522 Acquired absence of left knee: Secondary | ICD-10-CM | POA: Diagnosis not present

## 2015-11-19 DIAGNOSIS — T8789 Other complications of amputation stump: Secondary | ICD-10-CM | POA: Diagnosis not present

## 2015-11-19 DIAGNOSIS — Z79891 Long term (current) use of opiate analgesic: Secondary | ICD-10-CM | POA: Diagnosis not present

## 2015-11-19 DIAGNOSIS — I509 Heart failure, unspecified: Secondary | ICD-10-CM | POA: Diagnosis not present

## 2015-11-19 DIAGNOSIS — I70262 Atherosclerosis of native arteries of extremities with gangrene, left leg: Secondary | ICD-10-CM | POA: Diagnosis not present

## 2015-11-19 DIAGNOSIS — I739 Peripheral vascular disease, unspecified: Secondary | ICD-10-CM | POA: Diagnosis not present

## 2015-11-19 DIAGNOSIS — Z7982 Long term (current) use of aspirin: Secondary | ICD-10-CM | POA: Diagnosis not present

## 2015-11-19 DIAGNOSIS — L97129 Non-pressure chronic ulcer of left thigh with unspecified severity: Secondary | ICD-10-CM | POA: Diagnosis not present

## 2015-11-19 DIAGNOSIS — S78119A Complete traumatic amputation at level between unspecified hip and knee, initial encounter: Secondary | ICD-10-CM | POA: Diagnosis present

## 2015-11-19 DIAGNOSIS — I70222 Atherosclerosis of native arteries of extremities with rest pain, left leg: Secondary | ICD-10-CM | POA: Diagnosis not present

## 2015-11-19 DIAGNOSIS — Y838 Other surgical procedures as the cause of abnormal reaction of the patient, or of later complication, without mention of misadventure at the time of the procedure: Secondary | ICD-10-CM | POA: Diagnosis present

## 2015-11-19 HISTORY — PX: AMPUTATION: SHX166

## 2015-11-19 LAB — PROTIME-INR
INR: 1.07
Prothrombin Time: 14.1 seconds (ref 11.4–15.0)

## 2015-11-19 LAB — CREATININE, SERUM
Creatinine, Ser: 0.58 mg/dL (ref 0.44–1.00)
GFR calc Af Amer: >60 mL/min (ref >60)
GFR calc non Af Amer: >60 mL/min (ref >60)

## 2015-11-19 LAB — CBC
HCT: 34 % — ABNORMAL LOW (ref 35.0–47.0)
Hemoglobin: 10.6 g/dL — ABNORMAL LOW (ref 12.0–16.0)
MCH: 25 pg — ABNORMAL LOW (ref 26.0–34.0)
MCHC: 31.1 g/dL — ABNORMAL LOW (ref 32.0–36.0)
MCV: 80.3 fL (ref 80.0–100.0)
Platelets: 265 10*3/uL (ref 150–440)
RBC: 4.23 MIL/uL (ref 3.80–5.20)
RDW: 16.6 % — ABNORMAL HIGH (ref 11.5–14.5)
WBC: 8.6 10*3/uL (ref 3.6–11.0)

## 2015-11-19 LAB — APTT: aPTT: 32 seconds (ref 24–36)

## 2015-11-19 SURGERY — AMPUTATION, ABOVE KNEE
Anesthesia: General | Site: Leg Lower | Laterality: Left

## 2015-11-19 MED ORDER — FENTANYL CITRATE (PF) 100 MCG/2ML IJ SOLN
INTRAMUSCULAR | Status: AC
  Filled 2015-11-19: qty 2

## 2015-11-19 MED ORDER — ACETAMINOPHEN 10 MG/ML IV SOLN
INTRAVENOUS | Status: AC
  Filled 2015-11-19: qty 100

## 2015-11-19 MED ORDER — DOCUSATE SODIUM 100 MG PO CAPS
100.0000 mg | ORAL_CAPSULE | Freq: Two times a day (BID) | ORAL | Status: DC
  Administered 2015-11-19 – 2015-11-20 (×2): 100 mg via ORAL
  Filled 2015-11-19 (×2): qty 1

## 2015-11-19 MED ORDER — ONDANSETRON HCL 4 MG/2ML IJ SOLN
INTRAMUSCULAR | Status: DC | PRN
  Administered 2015-11-19: 4 mg via INTRAVENOUS

## 2015-11-19 MED ORDER — ACETAMINOPHEN 10 MG/ML IV SOLN
INTRAVENOUS | Status: DC | PRN
  Administered 2015-11-19: 1000 mg via INTRAVENOUS

## 2015-11-19 MED ORDER — HYDROCODONE-ACETAMINOPHEN 5-325 MG PO TABS
1.0000 | ORAL_TABLET | ORAL | Status: DC | PRN
  Administered 2015-11-19 – 2015-11-20 (×4): 2 via ORAL
  Filled 2015-11-19 (×4): qty 2

## 2015-11-19 MED ORDER — KETAMINE HCL 10 MG/ML IJ SOLN
INTRAMUSCULAR | Status: DC | PRN
  Administered 2015-11-19: 20 mg via INTRAVENOUS

## 2015-11-19 MED ORDER — HYDROMORPHONE HCL 1 MG/ML IJ SOLN
0.5000 mg | INTRAMUSCULAR | Status: AC | PRN
  Administered 2015-11-19 (×4): 0.5 mg via INTRAVENOUS

## 2015-11-19 MED ORDER — SORBITOL 70 % SOLN
30.0000 mL | Freq: Every day | Status: DC | PRN
  Filled 2015-11-19: qty 30

## 2015-11-19 MED ORDER — ACETAMINOPHEN 325 MG PO TABS: 650.0000 mg | ORAL_TABLET | Freq: Four times a day (QID) | ORAL | Status: DC | PRN

## 2015-11-19 MED ORDER — GLYCOPYRROLATE 0.2 MG/ML IJ SOLN
INTRAMUSCULAR | Status: DC | PRN
  Administered 2015-11-19: 0.2 mg via INTRAVENOUS

## 2015-11-19 MED ORDER — FENTANYL CITRATE (PF) 100 MCG/2ML IJ SOLN
INTRAMUSCULAR | Status: DC | PRN
Start: 2015-11-19 — End: 2015-11-19
  Administered 2015-11-19 (×4): 25 ug via INTRAVENOUS

## 2015-11-19 MED ORDER — MAGNESIUM HYDROXIDE 400 MG/5ML PO SUSP: 30.0000 mL | Freq: Every day | ORAL | Status: DC | PRN

## 2015-11-19 MED ORDER — LISINOPRIL 20 MG PO TABS
20.0000 mg | ORAL_TABLET | Freq: Every day | ORAL | Status: DC
  Administered 2015-11-20: 20 mg via ORAL
  Filled 2015-11-19: qty 1

## 2015-11-19 MED ORDER — PHENYLEPHRINE HCL 10 MG/ML IJ SOLN
INTRAMUSCULAR | Status: DC | PRN
  Administered 2015-11-19: 100 ug via INTRAVENOUS

## 2015-11-19 MED ORDER — ENOXAPARIN SODIUM 40 MG/0.4ML ~~LOC~~ SOLN: 40.0000 mg | SUBCUTANEOUS | Status: DC

## 2015-11-19 MED ORDER — SODIUM CHLORIDE 0.9% FLUSH
3.0000 mL | Freq: Two times a day (BID) | INTRAVENOUS | Status: DC
  Administered 2015-11-20: 3 mL via INTRAVENOUS

## 2015-11-19 MED ORDER — ONDANSETRON HCL 4 MG PO TABS: 4.0000 mg | ORAL_TABLET | Freq: Four times a day (QID) | ORAL | Status: DC | PRN

## 2015-11-19 MED ORDER — FENTANYL CITRATE (PF) 100 MCG/2ML IJ SOLN
25.0000 ug | INTRAMUSCULAR | Status: AC | PRN
  Administered 2015-11-19 (×6): 25 ug via INTRAVENOUS

## 2015-11-19 MED ORDER — HYDROMORPHONE HCL 1 MG/ML IJ SOLN
INTRAMUSCULAR | Status: AC
  Filled 2015-11-19: qty 1

## 2015-11-19 MED ORDER — FAMOTIDINE 20 MG PO TABS
20.0000 mg | ORAL_TABLET | ORAL | Status: DC
  Administered 2015-11-20: 20 mg via ORAL
  Filled 2015-11-19: qty 1

## 2015-11-19 MED ORDER — ONDANSETRON HCL 4 MG/2ML IJ SOLN: 4.0000 mg | Freq: Once | INTRAMUSCULAR | Status: DC | PRN

## 2015-11-19 MED ORDER — ONDANSETRON HCL 4 MG/2ML IJ SOLN
4.0000 mg | Freq: Four times a day (QID) | INTRAMUSCULAR | Status: DC | PRN
  Administered 2015-11-19: 4 mg via INTRAVENOUS
  Filled 2015-11-19: qty 2

## 2015-11-19 MED ORDER — PROPOFOL 10 MG/ML IV BOLUS
INTRAVENOUS | Status: DC | PRN
  Administered 2015-11-19: 120 mg via INTRAVENOUS
  Administered 2015-11-19: 20 mg via INTRAVENOUS

## 2015-11-19 MED ORDER — ATORVASTATIN CALCIUM 40 MG PO TABS
40.0000 mg | ORAL_TABLET | Freq: Every day | ORAL | Status: DC
Start: 2015-11-19 — End: 2015-11-19
  Filled 2015-11-19: qty 1

## 2015-11-19 MED ORDER — ASPIRIN EC 81 MG PO TBEC
81.0000 mg | DELAYED_RELEASE_TABLET | ORAL | Status: DC
  Administered 2015-11-20: 81 mg via ORAL
  Filled 2015-11-19: qty 1

## 2015-11-19 MED ORDER — LIDOCAINE HCL (PF) 2 % IJ SOLN
INTRAMUSCULAR | Status: DC | PRN
  Administered 2015-11-19: 50 mg

## 2015-11-19 MED ORDER — ACETAMINOPHEN 650 MG RE SUPP: 650.0000 mg | Freq: Four times a day (QID) | RECTAL | Status: DC | PRN

## 2015-11-19 MED ORDER — CEFAZOLIN SODIUM-DEXTROSE 2-3 GM-% IV SOLR
2.0000 g | INTRAVENOUS | Status: AC
  Administered 2015-11-19: 2 g via INTRAVENOUS

## 2015-11-19 MED ORDER — ATORVASTATIN CALCIUM 20 MG PO TABS: 40.0000 mg | ORAL_TABLET | Freq: Every day | ORAL | Status: DC

## 2015-11-19 MED ORDER — LACTATED RINGERS IV SOLN
INTRAVENOUS | Status: DC
  Administered 2015-11-19: 50 mL via INTRAVENOUS

## 2015-11-19 MED ORDER — ALUM & MAG HYDROXIDE-SIMETH 200-200-20 MG/5ML PO SUSP: 30.0000 mL | Freq: Four times a day (QID) | ORAL | Status: DC | PRN

## 2015-11-19 MED ORDER — CEFAZOLIN SODIUM-DEXTROSE 2-3 GM-% IV SOLR
INTRAVENOUS | Status: AC
  Filled 2015-11-19: qty 50

## 2015-11-19 MED ORDER — DEXTROSE-NACL 5-0.9 % IV SOLN
INTRAVENOUS | Status: AC
  Administered 2015-11-19: 18:00:00 via INTRAVENOUS

## 2015-11-19 SURGICAL SUPPLY — 36 items
BAG COUNTER SPONGE EZ (MISCELLANEOUS) IMPLANT
BANDAGE ELASTIC 6 LF NS (GAUZE/BANDAGES/DRESSINGS) IMPLANT
BLADE SAW GIGLI 510 (BLADE) ×2 IMPLANT
BLADE SURG SZ10 CARB STEEL (BLADE) ×2 IMPLANT
BNDG COHESIVE 4X5 TAN STRL (GAUZE/BANDAGES/DRESSINGS) ×2 IMPLANT
BNDG GAUZE 4.5X4.1 6PLY STRL (MISCELLANEOUS) ×4 IMPLANT
CANISTER SUCT 1200ML W/VALVE (MISCELLANEOUS) ×2 IMPLANT
CHLORAPREP W/TINT 26ML (MISCELLANEOUS) ×2 IMPLANT
DRAPE STERI IOBAN 125X83 (DRAPES) ×2 IMPLANT
ELECT CAUTERY BLADE 6.4 (BLADE) ×2 IMPLANT
ELECT REM PT RETURN 9FT ADLT (ELECTROSURGICAL) ×2
ELECTRODE REM PT RTRN 9FT ADLT (ELECTROSURGICAL) ×1 IMPLANT
GAUZE FLUFF 18X24 1PLY STRL (GAUZE/BANDAGES/DRESSINGS) IMPLANT
GAUZE PETRO XEROFOAM 1X8 (MISCELLANEOUS) ×2 IMPLANT
GLOVE SURG SYN 8.0 (GLOVE) ×2 IMPLANT
GOWN STRL REUS W/ TWL LRG LVL3 (GOWN DISPOSABLE) ×1 IMPLANT
GOWN STRL REUS W/ TWL XL LVL3 (GOWN DISPOSABLE) ×1 IMPLANT
GOWN STRL REUS W/TWL LRG LVL3 (GOWN DISPOSABLE) ×1
GOWN STRL REUS W/TWL XL LVL3 (GOWN DISPOSABLE) ×1
HANDLE YANKAUER SUCT BULB TIP (MISCELLANEOUS) ×2 IMPLANT
KIT RM TURNOVER STRD PROC AR (KITS) ×2 IMPLANT
NS IRRIG 500ML POUR BTL (IV SOLUTION) ×2 IMPLANT
PACK EXTREMITY ARMC (MISCELLANEOUS) ×2 IMPLANT
PAD ABD DERMACEA PRESS 5X9 (GAUZE/BANDAGES/DRESSINGS) ×2 IMPLANT
PAD PREP 24X41 OB/GYN DISP (PERSONAL CARE ITEMS) ×2 IMPLANT
SPONGE LAP 18X18 5 PK (GAUZE/BANDAGES/DRESSINGS) ×4 IMPLANT
STAPLER SKIN PROX 35W (STAPLE) ×2 IMPLANT
STOCKINETTE M/LG 89821 (MISCELLANEOUS) ×2 IMPLANT
SUT ETHIBOND 0 36 GRN (SUTURE) IMPLANT
SUT SILK 2 0 (SUTURE) ×1
SUT SILK 2-0 18XBRD TIE 12 (SUTURE) ×1 IMPLANT
SUT VIC AB 0 CT1 36 (SUTURE) IMPLANT
SUT VIC AB 3-0 SH 27 (SUTURE) ×2
SUT VIC AB 3-0 SH 27X BRD (SUTURE) ×2 IMPLANT
SUT VICRYL PLUS ABS 0 54 (SUTURE) ×2 IMPLANT
TAPE UMBIL 1/8X18 RADIOPA (MISCELLANEOUS) ×2 IMPLANT

## 2015-11-19 NOTE — Op Note (Signed)
    OPERATIVE NOTE   PROCEDURE: Revision of left above-knee amputation  PRE-OPERATIVE DIAGNOSIS: Complication of left above-knee amputation with ulceration of the stump  POST-OPERATIVE DIAGNOSIS: Same  SURGEON: Schnier, Latina Craver  ASSISTANT(S): Ms. Raul Del  ANESTHESIA: general  ESTIMATED BLOOD LOSS: Less than 10 cc  FINDING(S): Very large osteophyte which perforated the skin of the stump distally  SPECIMEN(S):  Debrided tissues with a segment of femur  INDICATIONS:   [name] is a 70 y.o. female who presents with ulceration of the left above-knee amputation stump and bone protruding through.  Therefore she is undergoing revision. The risks and benefits of been reviewed all questions answered patient has agreed to proceed  DESCRIPTION: After full informed written consent was obtained from the patient, the patient was brought back to the operating room and placed supine upon the operating table.  Prior to induction, the patient received IV antibiotics.   After obtaining adequate anesthesia, the patient was then prepped and draped in the standard fashion for a  revision of the left above-knee amputation site.  A teardrop shaped incision was then created around the protruding bone and the dissection carried through the skin and soft tissues to expose fascia fashion was incised and the periosteum of the femur was elevated. The femur was then transected with a power saw approximately 3 cm more proximally. The debrided tissue and segment of bone was then passed off the field. Using the power saw a bevel was then created in the transected femoral shaft. Rasp was used to smooth this area and then the incision was copiously irrigated with a liter saline.  The wound was then closed in multiple layers using 0 Vicryl for 2 layers followed by 3-0 Vicryl for 2 layers and then 4-0 Monocryl subcuticular and Dermabond. Sterile dressing was applied.   The patient tolerated this  procedure well.   COMPLICATIONS: None  CONDITION: [facility]  Office: [phone]   11/19/2015, 5:57 PM

## 2015-11-19 NOTE — Anesthesia Preprocedure Evaluation (Addendum)
Anesthesia Evaluation  Patient identified by MRN, date of birth, ID band Patient awake    Reviewed: Allergy & Precautions, NPO status , Patient's Chart, lab work & pertinent test results  Airway Mallampati: I  TM Distance: >3 FB Neck ROM: Full    Dental  (+) Upper Dentures, Lower Dentures   Pulmonary neg COPD, Current Smoker,    Pulmonary exam normal        Cardiovascular Exercise Tolerance: Poor hypertension, Pt. on medications + Peripheral Vascular Disease and +CHF  Normal cardiovascular exam  EF is 40-45%.   Neuro/Psych    GI/Hepatic   Endo/Other    Renal/GU Renal diseaseOne kidney is very small--the other one provides most or all of her renal functions.     Musculoskeletal  (+) Arthritis , Osteoarthritis,    Abdominal (+) + scaphoid   Peds  Hematology  (+) anemia , Hb 10.8.   Anesthesia Other Findings   Reproductive/Obstetrics                            Anesthesia Physical Anesthesia Plan  ASA: III  Anesthesia Plan: General   Post-op Pain Management:    Induction: Intravenous  Airway Management Planned: LMA  Additional Equipment:   Intra-op Plan:   Post-operative Plan: Extubation in OR  Informed Consent: I have reviewed the patients History and Physical, chart, labs and discussed the procedure including the risks, benefits and alternatives for the proposed anesthesia with the patient or authorized representative who has indicated his/her understanding and acceptance.     Plan Discussed with: CRNA  Anesthesia Plan Comments: (Very thin and frail, current smoker, severe PVD with stents in her legs, on coumadin, for AKA revision. Very allergic to morphine (severe N&V).)       Anesthesia Quick Evaluation

## 2015-11-19 NOTE — H&P (Signed)
[facility] History & Physical Update  The patient was interviewed and re-examined.  The patient's previous History and Physical has been reviewed and is unchanged.  There is no change in the plan of care. We plan to proceed with the scheduled procedure.  Schnier, Latina Craver, MD  11/19/2015, 2:56 PM

## 2015-11-19 NOTE — Anesthesia Postprocedure Evaluation (Signed)
Anesthesia Post Note  Patient: [name]  Procedure(s) Performed: Procedure(s) (LRB):  ( REVISION ) AMPUTATION ABOVE KNEE  (Left)  Patient location during evaluation: PACU Anesthesia Type: General Level of consciousness: awake and alert Pain management: pain level controlled Vital Signs Assessment: post-procedure vital signs reviewed and stable Respiratory status: spontaneous breathing and respiratory function stable Cardiovascular status: stable Anesthetic complications: no    Last Vitals:  Filed Vitals:   11/19/15 1644 11/19/15 1645  BP: 142/83 142/83  Pulse: 111 113  Temp: 36.5 C   Resp: 19 32    Last Pain:  Filed Vitals:   11/19/15 1646  PainSc: 0-No pain                 Larkyn Greenberger K

## 2015-11-19 NOTE — Transfer of Care (Signed)
Immediate Anesthesia Transfer of Care Note  Patient: [name]  Procedure(s) Performed: Procedure(s):  ( REVISION ) AMPUTATION ABOVE KNEE  (Left)  Patient Location: PACU  Anesthesia Type:General  Level of Consciousness: awake  Airway & Oxygen Therapy: Patient Spontanous Breathing and Patient connected to face mask oxygen  Post-op Assessment: Report given to RN  Post vital signs: Reviewed  Last Vitals:  Filed Vitals:   11/19/15 1256 11/19/15 1644  BP: 170/94 142/83  Pulse: 93 111  Temp: 36.9 C 36.5 C  Resp: 16 19    Complications: No apparent anesthesia complications

## 2015-11-19 NOTE — Anesthesia Procedure Notes (Signed)
Procedure Name: LMA Insertion Date/Time: 11/19/2015 4:00 PM Performed by: Mathews Argyle Pre-anesthesia Checklist: Patient identified, Patient being monitored, Timeout performed, Emergency Drugs available and Suction available Patient Re-evaluated:Patient Re-evaluated prior to inductionOxygen Delivery Method: Circle system utilized Preoxygenation: Pre-oxygenation with 100% oxygen Intubation Type: IV induction Ventilation: Mask ventilation without difficulty LMA: LMA inserted LMA Size: 3.0 Tube type: Oral Number of attempts: 1 Placement Confirmation: positive ETCO2 and breath sounds checked- equal and bilateral Tube secured with: Tape Dental Injury: Teeth and Oropharynx as per pre-operative assessment

## 2015-11-20 LAB — BASIC METABOLIC PANEL
Anion gap: 7 (ref 5–15)
BUN: 13 mg/dL (ref 6–20)
CO2: 24 mmol/L (ref 22–32)
Calcium: 8.8 mg/dL — ABNORMAL LOW (ref 8.9–10.3)
Chloride: 108 mmol/L (ref 101–111)
Creatinine, Ser: 0.6 mg/dL (ref 0.44–1.00)
GFR calc Af Amer: >60 mL/min (ref >60)
GFR calc non Af Amer: >60 mL/min (ref >60)
Glucose, Bld: 108 mg/dL — ABNORMAL HIGH (ref 65–99)
Potassium: 3.7 mmol/L (ref 3.5–5.1)
Sodium: 139 mmol/L (ref 135–145)

## 2015-11-20 LAB — CBC
HCT: 28.4 % — ABNORMAL LOW (ref 35.0–47.0)
Hemoglobin: 9.1 g/dL — ABNORMAL LOW (ref 12.0–16.0)
MCH: 25 pg — ABNORMAL LOW (ref 26.0–34.0)
MCHC: 31.9 g/dL — ABNORMAL LOW (ref 32.0–36.0)
MCV: 78.1 fL — ABNORMAL LOW (ref 80.0–100.0)
Platelets: 222 10*3/uL (ref 150–440)
RBC: 3.63 MIL/uL — ABNORMAL LOW (ref 3.80–5.20)
RDW: 16.5 % — ABNORMAL HIGH (ref 11.5–14.5)
WBC: 8.7 10*3/uL (ref 3.6–11.0)

## 2015-11-20 MED ORDER — PNEUMOCOCCAL VAC POLYVALENT 25 MCG/0.5ML IJ INJ: 0.5000 mL | INJECTION | INTRAMUSCULAR | Status: DC

## 2015-11-20 MED ORDER — HYDROCODONE-ACETAMINOPHEN 5-325 MG PO TABS: 2.0000 | ORAL_TABLET | Freq: Four times a day (QID) | ORAL | Status: DC | PRN

## 2015-11-20 NOTE — Progress Notes (Signed)
    Subjective  - POD #1  Ready to go home   Physical Exam:  Dressing dry       Assessment/Plan:  POD #1  D/c home F/u next week  Brabham, Wells 11/20/2015 6:49 PM --  Filed Vitals:   11/20/15 0550 11/20/15 1257  BP: 138/66 148/65  Pulse: 62 69  Temp: 98.1 F (36.7 C) 98.2 F (36.8 C)  Resp: 16 16    Intake/Output Summary (Last 24 hours) at 11/20/15 1849 Last data filed at 11/20/15 1700  Gross per 24 hour  Intake    836 ml  Output      0 ml  Net    836 ml     Laboratory CBC    Component Value Date/Time   WBC 8.7 11/20/2015 0532   WBC 10.4 02/12/2015 1641   HGB 9.1* 11/20/2015 0532   HGB 8.6* 02/12/2015 1641   HCT 28.4* 11/20/2015 0532   HCT 25.9* 02/12/2015 1641   PLT 222 11/20/2015 0532   PLT 343 02/12/2015 1641    BMET    Component Value Date/Time   NA 139 11/20/2015 0532   NA 131* 02/13/2015 0426   K 3.7 11/20/2015 0532   K 3.0* 02/13/2015 0426   CL 108 11/20/2015 0532   CL 103 02/13/2015 0426   CO2 24 11/20/2015 0532   CO2 23 02/13/2015 0426   GLUCOSE 108* 11/20/2015 0532   GLUCOSE 104* 02/13/2015 0426   BUN 13 11/20/2015 0532   BUN 10 02/13/2015 0426   CREATININE 0.60 11/20/2015 0532   CREATININE 0.83 02/13/2015 0426   CALCIUM 8.8* 11/20/2015 0532   CALCIUM 7.3* 02/13/2015 0426   GFRNONAA >60 11/20/2015 0532   GFRNONAA >60 02/13/2015 0426   GFRNONAA 51* 11/08/2014 0618   GFRAA >60 11/20/2015 0532   GFRAA >60 02/13/2015 0426   GFRAA >60 11/08/2014 0618    COAG Lab Results  Component Value Date   INR 1.07 11/19/2015   INR 1.28 07/23/2015   INR 1.87 07/17/2015   No results found for: PTT  Antibiotics Anti-infectives    Start     Dose/Rate Route Frequency Ordered Stop   11/19/15 1257  ceFAZolin (ANCEF) 2-3 GM-% IVPB SOLR    Comments:  Hebert, Melanie: cabinet override      11/19/15 1257 11/20/15 0059   11/19/15 0431  ceFAZolin (ANCEF) IVPB 2 g/50 mL premix     2 g 100 mL/hr over 30 Minutes Intravenous On call to  O.R. 11/19/15 0431 11/19/15 1535       V. Charlena Cross, M.D. [facility] Office: [phone] Pager:  [phone]

## 2015-11-20 NOTE — Progress Notes (Signed)
11/20/2015  7:38 PM  [name] to be D/C'd Home per MD order.  Discussed prescriptions and follow up appointments with the patient. Prescriptions given to patient, medication list explained in detail. Pt verbalized understanding.    Medication List    TAKE these medications        aspirin 81 MG tablet  Take 81 mg by mouth every morning.     atorvastatin 40 MG tablet  Commonly known as:  LIPITOR  Take 1 tablet (40 mg total) by mouth daily at 6 PM.     famotidine 20 MG tablet  Commonly known as:  PEPCID  Take 20 mg by mouth every morning.     HYDROcodone-acetaminophen 5-325 MG tablet  Commonly known as:  NORCO/VICODIN  Take 1 tablet by mouth every 4 (four) hours as needed.     HYDROcodone-acetaminophen 5-325 MG tablet  Commonly known as:  NORCO  Take 2 tablets by mouth every 6 (six) hours as needed for moderate pain.     lisinopril 20 MG tablet  Commonly known as:  PRINIVIL,ZESTRIL  Take 1 tablet (20 mg total) by mouth daily.     magnesium hydroxide 400 MG/5ML suspension  Commonly known as:  MILK OF MAGNESIA  Take 30 mLs by mouth daily as needed for mild constipation.     warfarin 5 MG tablet  Commonly known as:  COUMADIN  Take 2.5-5 mg by mouth daily. Takes 2 mg on Monday, Wednesday and Friday. All other days take      warfarin 2 MG tablet  Commonly known as:  COUMADIN  Take 2 mg by mouth as directed. 3 days a week Monday, Wednesday and Friday.        Filed Vitals:   11/20/15 0550 11/20/15 1257  BP: 138/66 148/65  Pulse: 62 69  Temp: 98.1 F (36.7 C) 98.2 F (36.8 C)  Resp: 16 16    Skin clean, dry and intact without evidence of skin break down, no evidence of skin tears noted. IV catheter discontinued intact. Site without signs and symptoms of complications. Dressing and pressure applied. Pt denies pain at this time. No complaints noted.  An After Visit Summary was printed and given to the patient. Patient escorted via WC, and D/C home via  private auto.  Bradly Chris

## 2015-11-22 ENCOUNTER — Encounter: Payer: Self-pay | Admitting: Vascular Surgery

## 2015-11-23 LAB — SURGICAL PATHOLOGY

## 2015-12-22 ENCOUNTER — Other Ambulatory Visit: Payer: Self-pay | Admitting: *Deleted

## 2015-12-22 NOTE — Patient Outreach (Signed)
[facility] Care Management  12/22/2015  [name] [birth year] [id number]   RN Health Coach  Attempted #1  screening  outreach call to patient.  Patient was unavailable. No voice mail pickup.  Gean MaidensFrances Konstantine Gervasi, BSN, RN [facility] Care Management RN Health Coach Phone: [phone] [facility] complies with applicable Federal civil rights laws and does not discriminate on the basis of race, color, national origin, age, disability, or sex. Espaol (Spanish)  [facility] cumple con las leyes federales de derechos civiles aplicables y no discrimina por motivos de raza, color, nacionalidad, edad, discapacidad o sexo.    Ti?ng Vi?t ([place]namese[place]  [facility] tun th? lu?t dn quy?n hi?n hnh c?a Lin bang v khng phn bi?t ?i x? d?a trn ch?ng t?c, mu da, ngu?n g?c qu?c gia, ? tu?i, khuy?t t?t, ho?c gi?i tnh.    (Arabic)    [facility]                      .

## 2016-01-04 ENCOUNTER — Other Ambulatory Visit
Admission: RE | Admit: 2016-01-04 | Discharge: 2016-01-04 | Disposition: A | Discharge status: Home / Self Care | Payer: Medicare Other | Source: Ambulatory Visit | Attending: Physician Assistant | Admitting: Physician Assistant

## 2016-01-04 DIAGNOSIS — Z0000 Encounter for general adult medical examination without abnormal findings: Secondary | ICD-10-CM | POA: Diagnosis not present

## 2016-01-04 DIAGNOSIS — R7301 Impaired fasting glucose: Secondary | ICD-10-CM | POA: Diagnosis not present

## 2016-01-04 DIAGNOSIS — E785 Hyperlipidemia, unspecified: Secondary | ICD-10-CM | POA: Insufficient documentation

## 2016-01-04 DIAGNOSIS — D649 Anemia, unspecified: Secondary | ICD-10-CM | POA: Diagnosis not present

## 2016-01-04 LAB — COMPREHENSIVE METABOLIC PANEL
ALT: 15 U/L (ref 14–54)
AST: 19 U/L (ref 15–41)
Albumin: 3.8 g/dL (ref 3.5–5.0)
Alkaline Phosphatase: 133 U/L — ABNORMAL HIGH (ref 38–126)
Anion gap: 6 (ref 5–15)
BUN: 22 mg/dL — ABNORMAL HIGH (ref 6–20)
CO2: 24 mmol/L (ref 22–32)
Calcium: 9.3 mg/dL (ref 8.9–10.3)
Chloride: 105 mmol/L (ref 101–111)
Creatinine, Ser: 0.75 mg/dL (ref 0.44–1.00)
GFR calc Af Amer: >60 mL/min (ref >60)
GFR calc non Af Amer: >60 mL/min (ref >60)
Glucose, Bld: 115 mg/dL — ABNORMAL HIGH (ref 65–99)
Potassium: 3.6 mmol/L (ref 3.5–5.1)
Sodium: 135 mmol/L (ref 135–145)
Total Bilirubin: 0.5 mg/dL (ref 0.3–1.2)
Total Protein: 7.6 g/dL (ref 6.5–8.1)

## 2016-01-04 LAB — LIPID PANEL
Cholesterol: 116 mg/dL (ref 0–200)
HDL: 38 mg/dL — ABNORMAL LOW (ref >40)
LDL Cholesterol: 58 mg/dL (ref 0–99)
Total CHOL/HDL Ratio: 3.1 RATIO
Triglycerides: 99 mg/dL (ref <150)
VLDL: 20 mg/dL (ref 0–40)

## 2016-01-04 LAB — CBC WITH DIFFERENTIAL/PLATELET
Basophils Absolute: 0 10*3/uL (ref 0–0.1)
Basophils Relative: 0 %
Eosinophils Absolute: 0.1 10*3/uL (ref 0–0.7)
Eosinophils Relative: 1 %
HCT: 35.6 % (ref 35.0–47.0)
Hemoglobin: 11.4 g/dL — ABNORMAL LOW (ref 12.0–16.0)
Lymphocytes Relative: 24 %
Lymphs Abs: 2.1 10*3/uL (ref 1.0–3.6)
MCH: 25.4 pg — ABNORMAL LOW (ref 26.0–34.0)
MCHC: 32.1 g/dL (ref 32.0–36.0)
MCV: 79.1 fL — ABNORMAL LOW (ref 80.0–100.0)
Monocytes Absolute: 0.5 10*3/uL (ref 0.2–0.9)
Monocytes Relative: 5 %
Neutro Abs: 6.3 10*3/uL (ref 1.4–6.5)
Neutrophils Relative %: 70 %
Platelets: 311 10*3/uL (ref 150–440)
RBC: 4.5 MIL/uL (ref 3.80–5.20)
RDW: 19.8 % — ABNORMAL HIGH (ref 11.5–14.5)
WBC: 9 10*3/uL (ref 3.6–11.0)

## 2016-01-04 LAB — IRON AND TIBC
Iron: 29 ug/dL (ref 28–170)
Saturation Ratios: 9 % — ABNORMAL LOW (ref 10.4–31.8)
TIBC: 343 ug/dL (ref 250–450)
UIBC: 314 ug/dL

## 2016-01-04 LAB — FERRITIN: Ferritin: 17 ng/mL (ref 11–307)

## 2016-01-06 ENCOUNTER — Other Ambulatory Visit: Payer: Self-pay | Admitting: *Deleted

## 2016-01-06 ENCOUNTER — Encounter: Payer: Self-pay | Admitting: *Deleted

## 2016-01-06 NOTE — Patient Outreach (Signed)
[facility]) Care Management  01/06/2016  [name] 01-Sep-1946 [id number]  RN Health Coach telephone call to patient.  [employer] compliance verified. Per patient she is not interested in any of the services. [facility].   Patient informed she would receive a pamphlet on the services and  to call if we can be of service.  [name] BSN RN [facility] Care Management [phone]

## 2016-01-17 DIAGNOSIS — R7301 Impaired fasting glucose: Secondary | ICD-10-CM | POA: Diagnosis not present

## 2016-01-17 DIAGNOSIS — D509 Iron deficiency anemia, unspecified: Secondary | ICD-10-CM | POA: Diagnosis not present

## 2016-01-17 DIAGNOSIS — G546 Phantom limb syndrome with pain: Secondary | ICD-10-CM | POA: Diagnosis not present

## 2016-01-17 DIAGNOSIS — M810 Age-related osteoporosis without current pathological fracture: Secondary | ICD-10-CM | POA: Diagnosis not present

## 2016-01-17 DIAGNOSIS — Z89619 Acquired absence of unspecified leg above knee: Secondary | ICD-10-CM | POA: Diagnosis not present

## 2016-01-21 ENCOUNTER — Ambulatory Visit: Payer: Medicare Other

## 2016-01-26 DIAGNOSIS — I70262 Atherosclerosis of native arteries of extremities with gangrene, left leg: Secondary | ICD-10-CM | POA: Diagnosis not present

## 2016-01-26 DIAGNOSIS — I824Z9 Acute embolism and thrombosis of unspecified deep veins of unspecified distal lower extremity: Secondary | ICD-10-CM | POA: Diagnosis not present

## 2016-02-28 DIAGNOSIS — I70262 Atherosclerosis of native arteries of extremities with gangrene, left leg: Secondary | ICD-10-CM | POA: Diagnosis not present

## 2016-02-28 DIAGNOSIS — I824Z9 Acute embolism and thrombosis of unspecified deep veins of unspecified distal lower extremity: Secondary | ICD-10-CM | POA: Diagnosis not present

## 2016-02-28 DIAGNOSIS — I739 Peripheral vascular disease, unspecified: Secondary | ICD-10-CM | POA: Diagnosis not present

## 2016-02-28 DIAGNOSIS — L97129 Non-pressure chronic ulcer of left thigh with unspecified severity: Secondary | ICD-10-CM | POA: Diagnosis not present

## 2016-02-28 DIAGNOSIS — S88119A Complete traumatic amputation at level between knee and ankle, unspecified lower leg, initial encounter: Secondary | ICD-10-CM | POA: Diagnosis not present

## 2016-02-28 DIAGNOSIS — I70222 Atherosclerosis of native arteries of extremities with rest pain, left leg: Secondary | ICD-10-CM | POA: Diagnosis not present

## 2016-04-03 DIAGNOSIS — L97129 Non-pressure chronic ulcer of left thigh with unspecified severity: Secondary | ICD-10-CM | POA: Diagnosis not present

## 2016-04-03 DIAGNOSIS — S88119A Complete traumatic amputation at level between knee and ankle, unspecified lower leg, initial encounter: Secondary | ICD-10-CM | POA: Diagnosis not present

## 2016-04-03 DIAGNOSIS — I824Z9 Acute embolism and thrombosis of unspecified deep veins of unspecified distal lower extremity: Secondary | ICD-10-CM | POA: Diagnosis not present

## 2016-04-03 DIAGNOSIS — I739 Peripheral vascular disease, unspecified: Secondary | ICD-10-CM | POA: Diagnosis not present

## 2016-04-03 DIAGNOSIS — I70222 Atherosclerosis of native arteries of extremities with rest pain, left leg: Secondary | ICD-10-CM | POA: Diagnosis not present

## 2016-04-03 DIAGNOSIS — I70262 Atherosclerosis of native arteries of extremities with gangrene, left leg: Secondary | ICD-10-CM | POA: Diagnosis not present

## 2016-04-10 DIAGNOSIS — I70262 Atherosclerosis of native arteries of extremities with gangrene, left leg: Secondary | ICD-10-CM | POA: Diagnosis not present

## 2016-04-10 DIAGNOSIS — I824Z9 Acute embolism and thrombosis of unspecified deep veins of unspecified distal lower extremity: Secondary | ICD-10-CM | POA: Diagnosis not present

## 2016-05-01 DIAGNOSIS — Z89619 Acquired absence of unspecified leg above knee: Secondary | ICD-10-CM | POA: Diagnosis not present

## 2016-05-01 DIAGNOSIS — M545 Low back pain: Secondary | ICD-10-CM | POA: Diagnosis not present

## 2016-05-01 DIAGNOSIS — M810 Age-related osteoporosis without current pathological fracture: Secondary | ICD-10-CM | POA: Diagnosis not present

## 2016-05-01 DIAGNOSIS — G546 Phantom limb syndrome with pain: Secondary | ICD-10-CM | POA: Diagnosis not present

## 2016-05-10 ENCOUNTER — Other Ambulatory Visit: Payer: Self-pay | Admitting: Physician Assistant

## 2016-05-10 DIAGNOSIS — M8580 Other specified disorders of bone density and structure, unspecified site: Secondary | ICD-10-CM

## 2016-05-18 DIAGNOSIS — I70262 Atherosclerosis of native arteries of extremities with gangrene, left leg: Secondary | ICD-10-CM | POA: Diagnosis not present

## 2016-05-18 DIAGNOSIS — I824Z9 Acute embolism and thrombosis of unspecified deep veins of unspecified distal lower extremity: Secondary | ICD-10-CM | POA: Diagnosis not present

## 2016-05-18 DIAGNOSIS — I739 Peripheral vascular disease, unspecified: Secondary | ICD-10-CM | POA: Diagnosis not present

## 2016-05-18 DIAGNOSIS — I70222 Atherosclerosis of native arteries of extremities with rest pain, left leg: Secondary | ICD-10-CM | POA: Diagnosis not present

## 2016-05-18 DIAGNOSIS — L97129 Non-pressure chronic ulcer of left thigh with unspecified severity: Secondary | ICD-10-CM | POA: Diagnosis not present

## 2016-05-18 DIAGNOSIS — S88119A Complete traumatic amputation at level between knee and ankle, unspecified lower leg, initial encounter: Secondary | ICD-10-CM | POA: Diagnosis not present

## 2016-05-31 ENCOUNTER — Ambulatory Visit: Payer: Medicare Other

## 2016-06-22 DIAGNOSIS — S88119A Complete traumatic amputation at level between knee and ankle, unspecified lower leg, initial encounter: Secondary | ICD-10-CM | POA: Diagnosis not present

## 2016-06-22 DIAGNOSIS — L97129 Non-pressure chronic ulcer of left thigh with unspecified severity: Secondary | ICD-10-CM | POA: Diagnosis not present

## 2016-06-22 DIAGNOSIS — I739 Peripheral vascular disease, unspecified: Secondary | ICD-10-CM | POA: Diagnosis not present

## 2016-06-22 DIAGNOSIS — I70222 Atherosclerosis of native arteries of extremities with rest pain, left leg: Secondary | ICD-10-CM | POA: Diagnosis not present

## 2016-06-22 DIAGNOSIS — I824Z9 Acute embolism and thrombosis of unspecified deep veins of unspecified distal lower extremity: Secondary | ICD-10-CM | POA: Diagnosis not present

## 2016-06-22 DIAGNOSIS — I70262 Atherosclerosis of native arteries of extremities with gangrene, left leg: Secondary | ICD-10-CM | POA: Diagnosis not present

## 2016-06-26 DIAGNOSIS — L97129 Non-pressure chronic ulcer of left thigh with unspecified severity: Secondary | ICD-10-CM | POA: Diagnosis not present

## 2016-06-26 DIAGNOSIS — I739 Peripheral vascular disease, unspecified: Secondary | ICD-10-CM | POA: Diagnosis not present

## 2016-06-26 DIAGNOSIS — S88119A Complete traumatic amputation at level between knee and ankle, unspecified lower leg, initial encounter: Secondary | ICD-10-CM | POA: Diagnosis not present

## 2016-06-26 DIAGNOSIS — I70262 Atherosclerosis of native arteries of extremities with gangrene, left leg: Secondary | ICD-10-CM | POA: Diagnosis not present

## 2016-06-26 DIAGNOSIS — I87099 Postthrombotic syndrome with other complications of unspecified lower extremity: Secondary | ICD-10-CM | POA: Diagnosis not present

## 2016-06-26 DIAGNOSIS — I70222 Atherosclerosis of native arteries of extremities with rest pain, left leg: Secondary | ICD-10-CM | POA: Diagnosis not present

## 2016-07-04 ENCOUNTER — Ambulatory Visit: Payer: Medicare Other | Attending: Physician Assistant

## 2016-07-10 DIAGNOSIS — I70222 Atherosclerosis of native arteries of extremities with rest pain, left leg: Secondary | ICD-10-CM | POA: Diagnosis not present

## 2016-07-10 DIAGNOSIS — I739 Peripheral vascular disease, unspecified: Secondary | ICD-10-CM | POA: Diagnosis not present

## 2016-07-10 DIAGNOSIS — S88119A Complete traumatic amputation at level between knee and ankle, unspecified lower leg, initial encounter: Secondary | ICD-10-CM | POA: Diagnosis not present

## 2016-07-10 DIAGNOSIS — I70262 Atherosclerosis of native arteries of extremities with gangrene, left leg: Secondary | ICD-10-CM | POA: Diagnosis not present

## 2016-07-10 DIAGNOSIS — L97129 Non-pressure chronic ulcer of left thigh with unspecified severity: Secondary | ICD-10-CM | POA: Diagnosis not present

## 2016-07-10 DIAGNOSIS — I82429 Acute embolism and thrombosis of unspecified iliac vein: Secondary | ICD-10-CM | POA: Diagnosis not present

## 2016-07-24 ENCOUNTER — Ambulatory Visit (INDEPENDENT_AMBULATORY_CARE_PROVIDER_SITE_OTHER): Payer: Medicare Other | Admitting: Vascular Surgery

## 2016-07-24 VITALS — BP 119/72 | HR 91 | Resp 16 | Ht 65.0 in | Wt 105.0 lb

## 2016-07-24 DIAGNOSIS — I824Z3 Acute embolism and thrombosis of unspecified deep veins of distal lower extremity, bilateral: Secondary | ICD-10-CM

## 2016-07-24 LAB — POCT INR: INR: 2.6

## 2016-07-24 NOTE — Progress Notes (Signed)
Patient seen today for INR check.  Currently taking Mon-Fri 5mg .Sat-Sun 2.5mg .  Any change in diet No? Any nosebleeds or other bleeding issues No? Any headaches No?  Any breathing issues No?  Todays results 2.6.  INR is good no changes follow up in 4 weeks  BP 119/72   Pulse 91   Resp 16   Ht 5\' 5"  (1.651 m)   Wt 105 lb (47.6 kg) Comment: pt reported  BMI 17.47 kg/m    ICD-9-CM ICD-10-CM   1. 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

## 2016-07-26 ENCOUNTER — Encounter: Payer: Self-pay | Admitting: *Deleted

## 2016-07-26 ENCOUNTER — Emergency Department
Admission: EM | Admit: 2016-07-26 | Discharge: 2016-07-26 | Disposition: A | Discharge status: Home / Self Care | Payer: Medicare Other | Attending: Emergency Medicine | Admitting: Emergency Medicine

## 2016-07-26 ENCOUNTER — Telehealth (INDEPENDENT_AMBULATORY_CARE_PROVIDER_SITE_OTHER): Payer: Self-pay

## 2016-07-26 DIAGNOSIS — F17200 Nicotine dependence, unspecified, uncomplicated: Secondary | ICD-10-CM | POA: Diagnosis not present

## 2016-07-26 DIAGNOSIS — Z7982 Long term (current) use of aspirin: Secondary | ICD-10-CM | POA: Diagnosis not present

## 2016-07-26 DIAGNOSIS — T148XXA Other injury of unspecified body region, initial encounter: Secondary | ICD-10-CM

## 2016-07-26 DIAGNOSIS — Z7901 Long term (current) use of anticoagulants: Secondary | ICD-10-CM | POA: Diagnosis not present

## 2016-07-26 DIAGNOSIS — T814XXA Infection following a procedure, initial encounter: Secondary | ICD-10-CM | POA: Insufficient documentation

## 2016-07-26 DIAGNOSIS — Z79899 Other long term (current) drug therapy: Secondary | ICD-10-CM | POA: Diagnosis not present

## 2016-07-26 DIAGNOSIS — L089 Local infection of the skin and subcutaneous tissue, unspecified: Secondary | ICD-10-CM

## 2016-07-26 DIAGNOSIS — Y658 Other specified misadventures during surgical and medical care: Secondary | ICD-10-CM | POA: Diagnosis not present

## 2016-07-26 MED ORDER — CLINDAMYCIN HCL 300 MG PO CAPS
300.0000 mg | ORAL_CAPSULE | Freq: Four times a day (QID) | ORAL | 0 refills | Status: AC
Start: 2016-07-26 — End: 2016-08-05

## 2016-07-26 MED ORDER — CLINDAMYCIN HCL 150 MG PO CAPS
300.0000 mg | ORAL_CAPSULE | Freq: Once | ORAL | Status: AC
  Administered 2016-07-26: 300 mg via ORAL
  Filled 2016-07-26: qty 2

## 2016-07-26 NOTE — ED Provider Notes (Signed)
[facility] Emergency Department Provider Note   ____________________________________________   I have reviewed the triage vital signs and the nursing notes.   HISTORY  Chief Complaint Post-op Problem   History limited by: Not Limited   HPI [name] is a 70 y.o. female With history of peripheral arterial disease, status post bilateral above-the-knee amputations, who presents to the emergency department today because of concerns for foreign body emerging from her left AKA wound. She states it is a tube like structure. There has been a minimal amount of bleeding with it. She has noticed that the structure has come out a little further as the day has progressed. No pain. No fevers.    Past Medical History:  Diagnosis Date  . Hypertension   . Peripheral vascular disease (HCC)   . Pneumonia 07/14/15  . Tobacco abuse   . 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 Amputation of lower extremity above knee with complication (HCC) 11/19/2015  . Pre-operative cardiovascular examination 11/15/2015  . Essential hypertension 11/15/2015  . Chronic systolic heart failure (HCC) 08/20/2015  . Tobacco use 08/20/2015  . Arterial occlusion (HCC) 07/14/2015  . Pressure ulcer 07/14/2015  . Acute respiratory failure (HCC) 07/14/2015  . Pneumonia 02/13/2015    Past Surgical History:  Procedure Laterality Date  . ABOVE KNEE LEG AMPUTATION Right 01/2015  . AMPUTATION Left 07/16/2015   Procedure: AMPUTATION ABOVE KNEE;  Surgeon: Renford Dills, MD;  Location: [facility] ORS;  Service: Vascular;  Laterality: Left;  . AMPUTATION Left 07/23/2015   Procedure:   WOUND CLOSURE;  Surgeon: Renford Dills, MD;  Location: [facility] ORS;  Service: Vascular;  Laterality: Left;  . AMPUTATION Left 11/19/2015   Procedure:  ( REVISION ) AMPUTATION ABOVE KNEE ;  Surgeon: Renford Dills, MD;  Location: [facility] ORS;   Service: Vascular;  Laterality: Left;  . APPENDECTOMY  1969  . ECTOPIC PREGNANCY SURGERY Right 1969  . PERIPHERAL VASCULAR CATHETERIZATION N/A 07/22/2015   Procedure: Abdominal Aortogram w/Lower Extremity;  Surgeon: Annice Needy, MD;  Location: [facility] INVASIVE CV LAB;  Service: Cardiovascular;  Laterality: N/A;  . PERIPHERAL VASCULAR CATHETERIZATION  07/22/2015   Procedure: Lower Extremity Intervention;  Surgeon: Annice Needy, MD;  Location: [facility] INVASIVE CV LAB;  Service: Cardiovascular;;  . stents left leg      Prior to Admission medications   Medication Sig Start Date End Date Taking? Authorizing Provider  aspirin 81 MG tablet Take 81 mg by mouth every morning.     Historical Provider, MD  atorvastatin (LIPITOR) 40 MG tablet Take 1 tablet (40 mg total) by mouth daily at 6 PM. Patient taking differently: Take 40 mg by mouth at bedtime.  07/23/15   Enedina Finner, MD  famotidine (PEPCID) 20 MG tablet Take 20 mg by mouth every morning.     Historical Provider, MD  HYDROcodone-acetaminophen (NORCO) 5-325 MG tablet Take 2 tablets by mouth every 6 (six) hours as needed for moderate pain. Patient not taking: Reported on 07/24/2016 11/20/15   Nada Libman, MD  HYDROcodone-acetaminophen (NORCO/VICODIN) 5-325 MG tablet Take 1 tablet by mouth every 4 (four) hours as needed.     Historical Provider, MD  lisinopril (PRINIVIL,ZESTRIL) 20 MG tablet Take 1 tablet (20 mg total) by mouth daily. 11/15/15   Iran Ouch, MD  magnesium hydroxide (MILK OF MAGNESIA) 400 MG/5ML suspension Take 30 mLs by mouth  daily as needed for mild constipation.    Historical Provider, MD  traMADol (ULTRAM) 50 MG tablet Take 50 mg by mouth as needed.    Geoffery LyonsEric M Turner, PA  warfarin (COUMADIN) 2 MG tablet Take 2 mg by mouth as directed. 3 days a week Monday, Wednesday and Friday.    Historical Provider, MD  warfarin (COUMADIN) 5 MG tablet Take 2.5-5 mg by mouth daily. Takes 2 mg on Monday, Wednesday and Friday. All other days take 5mg      Historical Provider, MD    Allergies Morphine and related  Family History  Problem Relation Age of Onset  . CAD Mother   . Hypertension Mother   . Stroke Mother   . CAD Father   . Hypertension Father     Social History Social History  Substance Use Topics  . Smoking status: Light Tobacco Smoker    Packs/day: 0.25    Years: 55.00    Last attempt to quit: 01/20/2015  . Smokeless tobacco: Never Used  . Alcohol use No    Review of Systems  Constitutional: Negative for fever. Cardiovascular: Negative for chest pain. Respiratory: Negative for shortness of breath. Gastrointestinal: Negative for abdominal pain, vomiting and diarrhea. Musculoskeletal: Expulsion of foreign body from her left aka Skin: Negative for rash. Neurological: Negative for headaches, focal weakness or numbness. 10-point ROS otherwise negative.  ____________________________________________   PHYSICAL EXAM:  VITAL SIGNS: ED Triage Vitals  Enc Vitals Group     BP 07/26/16 1642 (!) 117/94     Pulse Rate 07/26/16 1642 (!) 104     Resp 07/26/16 1642 16     Temp 07/26/16 1642 98.6 F (37 C)     Temp Source 07/26/16 1642 Oral     SpO2 07/26/16 1642 99 %     Weight 07/26/16 1643 105 lb (47.6 kg)     Height 07/26/16 1643 5\' 5"  (1.651 m)   Constitutional: Alert and oriented. Well appearing and in no distress. Eyes: Conjunctivae are normal. Normal extraocular movements. ENT   Head: Normocephalic and atraumatic.   Nose: No congestion/rhinnorhea.   Mouth/Throat: Mucous membranes are moist.   Neck: No stridor. Hematological/Lymphatic/Immunilogical: No cervical lymphadenopathy. Cardiovascular: Normal rate, regular rhythm.  No murmurs, rubs, or gallops. Respiratory: Normal respiratory effort without tachypnea nor retractions. Breath sounds are clear and equal bilaterally. No wheezes/rales/rhonchi. Gastrointestinal: Soft and nontender. No distention.  Genitourinary: Deferred Musculoskeletal:  Bilateral AKA. Left with some foul odor and small amount of pus at wound. Has roughly 3 cm length of vascular stent protruding from wound. No bleeding noted.  Neurologic:  Normal speech and language. No gross focal neurologic deficits are appreciated.  Skin:  Skin is warm, dry and intact. No rash noted. Psychiatric: Mood and affect are normal. Speech and behavior are normal. Patient exhibits appropriate insight and judgment.  ____________________________________________    LABS (pertinent positives/negatives)  None  ____________________________________________   EKG  None  ____________________________________________    RADIOLOGY  None  ____________________________________________   PROCEDURES  Procedures  ____________________________________________   INITIAL IMPRESSION / ASSESSMENT AND PLAN / ED COURSE  Pertinent labs & imaging results that were available during my care of the patient were reviewed by me and considered in my medical decision making (see chart for details).  Patient appears to be expelling a vascular stent from left aka. I do have some concern for surrounding infection. Discussed with Dr. Gilda CreaseSchnier who can see patient in clinic tomorrow. Will place on antibiotics.  ____________________________________________   FINAL  CLINICAL IMPRESSION(S) / ED DIAGNOSES  Final diagnoses:  Wound infection     Note: This dictation was prepared with Dragon dictation. Any transcriptional errors that result from this process are unintentional    Phineas Semen, MD 07/26/16 1947

## 2016-07-26 NOTE — Discharge Instructions (Signed)
Please seek medical attention for any high fevers, chest pain, shortness of breath, change in behavior, persistent vomiting, bloody stool or any other new or concerning symptoms.  

## 2016-07-26 NOTE — ED Triage Notes (Signed)
Pt arrived to ED reporting having had surgery on left stump in February to remove a bone spur. Pt reports last night wound opened and began bleeding. Pt currently on blood thinners but bleeding is under control at this time. Pt reports while changing dressing today she noted a "tube was coming out of wound." Upon assessment there is a hallow tube coming from pts left stump. Pt reports Dr. Gilda CreaseSchnier performed surgery but his office told her to report to the ED today. No signs of infection noted to the area.

## 2016-07-26 NOTE — ED Notes (Addendum)
Foreign body out of left leg that patient noticed this AM. No signs of infection around site. Pt denies pain. Pt bilateral lower leg amputee. No bleeding at this time.

## 2016-07-26 NOTE — Telephone Encounter (Signed)
Patient called stated she is having bleeding from her leg that she had surgery on 11/19/15, she states that it will not stop bleeding( left AKA ) and she wanted to be seen by the doctor so he could fix it. I explained that Dr. Gilda CreaseSchnier was not in the office and if she is bleeding and unable to stop it she should be seen at the ER to be evaluated. Patient adamantly refused to go to the ER and instead wanted an appt..Marland Kitchen

## 2016-07-27 ENCOUNTER — Encounter (INDEPENDENT_AMBULATORY_CARE_PROVIDER_SITE_OTHER): Payer: Self-pay

## 2016-07-27 ENCOUNTER — Encounter (INDEPENDENT_AMBULATORY_CARE_PROVIDER_SITE_OTHER): Payer: Self-pay | Admitting: Vascular Surgery

## 2016-07-27 ENCOUNTER — Ambulatory Visit (INDEPENDENT_AMBULATORY_CARE_PROVIDER_SITE_OTHER): Payer: Medicare Other | Admitting: Vascular Surgery

## 2016-07-27 ENCOUNTER — Other Ambulatory Visit (INDEPENDENT_AMBULATORY_CARE_PROVIDER_SITE_OTHER): Payer: Self-pay | Admitting: Vascular Surgery

## 2016-07-27 DIAGNOSIS — T829XXD Unspecified complication of cardiac and vascular prosthetic device, implant and graft, subsequent encounter: Secondary | ICD-10-CM | POA: Diagnosis not present

## 2016-07-27 DIAGNOSIS — T829XXA Unspecified complication of cardiac and vascular prosthetic device, implant and graft, initial encounter: Secondary | ICD-10-CM | POA: Insufficient documentation

## 2016-07-27 DIAGNOSIS — Z89611 Acquired absence of right leg above knee: Secondary | ICD-10-CM

## 2016-07-27 DIAGNOSIS — I7025 Atherosclerosis of native arteries of other extremities with ulceration: Secondary | ICD-10-CM

## 2016-07-27 DIAGNOSIS — Z89619 Acquired absence of unspecified leg above knee: Secondary | ICD-10-CM | POA: Insufficient documentation

## 2016-07-27 DIAGNOSIS — S78112D Complete traumatic amputation at level between left hip and knee, subsequent encounter: Secondary | ICD-10-CM

## 2016-07-27 DIAGNOSIS — I10 Essential (primary) hypertension: Secondary | ICD-10-CM

## 2016-07-27 DIAGNOSIS — I700 Atherosclerosis of aorta: Secondary | ICD-10-CM | POA: Insufficient documentation

## 2016-07-27 DIAGNOSIS — I739 Peripheral vascular disease, unspecified: Secondary | ICD-10-CM | POA: Insufficient documentation

## 2016-07-27 NOTE — Progress Notes (Signed)
MRN : [id number]  [name] is a 70 y.o. (1946-06-06) female who presents with chief complaint of  Chief Complaint  Patient presents with  . Re-evaluation    Follow up, leg draining  .  History of Present Illness: The patient presents to the office today after being seen in the emergency room yesterday for drainage coming from the end of her left above-knee amputation stump. At the ER visit it was identified that one of her previously placed vascular stents has begun working its way out. Patient notes that over the past several weeks she has had some pain and tenderness in this area which has largely subsided once the stent began protruding.  She denies fever or chills. She denies severe pain in the stump itself.  She does have a history of revision of this amputation site secondary to a hyperplastic bony growth which required revision of the amputation. This was performed approximately 1 year ago.  Current Outpatient Prescriptions  Medication Sig Dispense Refill  . aspirin 81 MG tablet Take 81 mg by mouth every morning.     Marland Kitchen atorvastatin (LIPITOR) 40 MG tablet Take 1 tablet (40 mg total) by mouth daily at 6 PM. (Patient taking differently: Take 40 mg by mouth at bedtime. ) 30 tablet 0  . clindamycin (CLEOCIN) 300 MG capsule Take 1 capsule (300 mg total) by mouth 4 (four) times daily. 40 capsule 0  . famotidine (PEPCID) 20 MG tablet Take 20 mg by mouth every morning.     Marland Kitchen lisinopril (PRINIVIL,ZESTRIL) 20 MG tablet     . magnesium hydroxide (MILK OF MAGNESIA) 400 MG/5ML suspension Take 30 mLs by mouth daily as needed for mild constipation.    . traMADol (ULTRAM) 50 MG tablet Take 50 mg by mouth as needed.    . warfarin (COUMADIN) 2 MG tablet Take 2 mg by mouth as directed. 3 days a week Monday, Wednesday and Friday.    . warfarin (COUMADIN) 5 MG tablet Take 2.5-5 mg by mouth daily. Takes 2 mg on Monday, Wednesday and Friday. All other days take 5mg      No current  facility-administered medications for this visit.     Past Medical History:  Diagnosis Date  . Hypertension   . Peripheral vascular disease (HCC)   . Pneumonia 07/14/15  . Tobacco abuse   . Unilateral small kidney    a. one functional kidney    Past Surgical History:  Procedure Laterality Date  . ABOVE KNEE LEG AMPUTATION Right 01/2015  . AMPUTATION Left 07/16/2015   Procedure: AMPUTATION ABOVE KNEE;  Surgeon: Renford Dills, MD;  Location: [facility] ORS;  Service: Vascular;  Laterality: Left;  . AMPUTATION Left 07/23/2015   Procedure:   WOUND CLOSURE;  Surgeon: Renford Dills, MD;  Location: [facility] ORS;  Service: Vascular;  Laterality: Left;  . AMPUTATION Left 11/19/2015   Procedure:  ( REVISION ) AMPUTATION ABOVE KNEE ;  Surgeon: Renford Dills, MD;  Location: [facility] ORS;  Service: Vascular;  Laterality: Left;  . APPENDECTOMY  1969  . ECTOPIC PREGNANCY SURGERY Right 1969  . PERIPHERAL VASCULAR CATHETERIZATION N/A 07/22/2015   Procedure: Abdominal Aortogram w/Lower Extremity;  Surgeon: Annice Needy, MD;  Location: [facility] INV[facility];  Service: Cardiovascular;  Laterality: N/A;  . PERIPHERAL VASCULAR CATHETERIZATION  07/22/2015   Procedure: Lower Extremity Intervention;  Surgeon: Annice Needy, MD;  Location: [facility] INVASIVE CV LAB;  Service: Cardiovascular;;  . stents left leg  Social History Social History  Substance Use Topics  . Smoking status: Light Tobacco Smoker    Packs/day: 0.25    Years: 55.00    Last attempt to quit: 01/20/2015  . Smokeless tobacco: Never Used  . Alcohol use No     Family History Family History  Problem Relation Age of Onset  . CAD Mother   . Hypertension Mother   . Stroke Mother   . CAD Father   . Hypertension Father      Allergies  Allergen Reactions  . Morphine And Related Diarrhea and Nausea And Vomiting     REVIEW OF SYSTEMS (Negative unless checked)  Constitutional: [] Weight loss  [] Fever  [] Chills Cardiac: [] Chest pain   [] Chest  pressure   [] Palpitations   [] Shortness of breath when laying flat   [] Shortness of breath at rest   [] Shortness of breath with exertion. Vascular:  [] Pain in legs with walking   [] Pain in legs at rest   [x] Pain in legs when laying flat   [] Claudication   [] Pain in feet when walking  [] Pain in feet at rest  [] Pain in feet when laying flat   [] History of DVT   [] Phlebitis   [] Swelling in legs   [] Varicose veins   [] Non-healing ulcers Pulmonary:   [] Uses home oxygen   [] Productive cough   [] Hemoptysis   [] Wheeze  [] COPD   [] Asthma Neurologic:  [] Dizziness  [] Blackouts   [] Seizures   [] History of stroke   [] History of TIA  [] Aphasia   [] Temporary blindness   [] Dysphagia   [] Weakness or numbness in arms   [] Weakness or numbness in legs Musculoskeletal:  [] Arthritis   [] Joint swelling   [] Joint pain   [x] Low back pain Hematologic:  [] Easy bruising  [] Easy bleeding   [] Hypercoagulable state   [] Anemic  [] Hepatitis Gastrointestinal:  [] Blood in stool   [] Vomiting blood  [] Gastroesophageal reflux/heartburn   [] Difficulty swallowing. Genitourinary:  [] Chronic kidney disease   [] Difficult urination  [] Frequent urination  [] Burning with urination   [] Blood in urine Skin:  [] Rashes   [x] Ulcers   [x] Wounds Psychological:  [] History of anxiety   []  History of major depression.  Physical Examination  Vitals:   07/27/16 1541  BP: 132/70  Pulse: 91  Weight: 105 lb (47.6 kg)   Body mass index is 17.47 kg/m. Gen:  WD/WN, NAD Head: [place]/AT, No temporalis wasting. Ear/Nose/Throat: Hearing grossly intact, nares w/o erythema or drainage, trachea midline Eyes: PERRLA, EOMI. Sclera non-icteric Neck: Supple, no nuchal rigidity.  No bruit or JVD.  Pulmonary:  Good air movement, equal and clear to auscultation bilaterally.  Cardiac: RRR, normal S1, S2, no Murmurs, rubs or gallops. Vascular: Right above-knee amputation stump remains well healed; left above-knee amputation stump demonstrates several centimeters of the  stent protruding from the midportion of the incisional scar. There is some purulent drainage surrounding the stent but there is no significant induration erythema or warmth of the soft tissues surrounding the stent. Vessel Right Left  Radial Palpable Palpable  Ulnar Palpable Palpable  Brachial Palpable Palpable  Carotid Palpable, without bruit Palpable, without bruit  Aorta Not palpable N/A  Femoral Not Palpable Not Palpable  Popliteal AKA  AKA   PT AKA  AKA   DP AKA  AKA    Gastrointestinal: soft, non-tender/non-distended. No guarding/reflex.  Musculoskeletal: M/S 5/5 Upper extremities.  Bilateral above-knee amputations.  Neurologic: CN 2-12 intact. Pain and light touch intact in extremities.  Symmetrical.  Speech is fluent. Motor exam as listed above.  Psychiatric: Judgment intact, Mood & affect appropriate for pt's clinical situation. Dermatologic: No rashes or ulcers noted.  No cellulitis or open wounds. Lymph : No Cervical, Axillary, or Inguinal lymphadenopathy.    CBC Lab Results  Component Value Date   WBC 9.0 01/04/2016   HGB 11.4 (L) 01/04/2016   HCT 35.6 01/04/2016   MCV 79.1 (L) 01/04/2016   PLT 311 01/04/2016    BMET    Component Value Date/Time   NA 135 01/04/2016 1230   NA 131 (L) 02/13/2015 0426   K 3.6 01/04/2016 1230   K 3.0 (L) 02/13/2015 0426   CL 105 01/04/2016 1230   CL 103 02/13/2015 0426   CO2 24 01/04/2016 1230   CO2 23 02/13/2015 0426   GLUCOSE 115 (H) 01/04/2016 1230   GLUCOSE 104 (H) 02/13/2015 0426   BUN 22 (H) 01/04/2016 1230   BUN 10 02/13/2015 0426   CREATININE 0.75 01/04/2016 1230   CREATININE 0.83 02/13/2015 0426   CALCIUM 9.3 01/04/2016 1230   CALCIUM 7.3 (L) 02/13/2015 0426   GFRNONAA >60 01/04/2016 1230   GFRNONAA >60 02/13/2015 0426   GFRAA >60 01/04/2016 1230   GFRAA >60 02/13/2015 0426   CrCl cannot be calculated (Patient's most recent lab result is older than the maximum 21 days allowed.).  COAG Lab Results  Component  Value Date   INR 2.6 07/24/2016   INR 1.07 11/19/2015   INR 1.28 07/23/2015    Radiology No results found.  Assessment/Plan  1. Complications due to vascular device, implant, and graft, subsequent encounter The patient presents today with a remotely placed vascular stent protruding from the distal aspect of her stump. This will be to be removed in its entirety and the wound washed and subsequently wicked as the primary dressing change. She is already on oral antibiotics which should suffice since there does not appear to be significant cellulitis surrounding the stent. Arrangements will be made for this to be performed in the OR. The risks and benefits were reviewed all questions were answered alternative therapies were discussed. The patient agrees to proceed  2. Amputation of left lower extremity above knee with complication, subsequent encounter [facility]) Plan as noted above the patient will require complete removal of the stent in the operating room  3. Status post above knee amputation of right lower extremity (HCC) Right side remains well healed patient has declined prosthesis given the bilateral nature of her amputations  4. Atherosclerosis of native arteries of the extremities with ulceration (HCC) Plan as noted in problem #1  5. Essential hypertension Meds are reviewed no changes at this time.  Levora Dredge, MD  07/27/2016 4:54 PM    This note was created with Dragon medical transcription system.  Any errors from dictation are purely unintentional

## 2016-07-28 ENCOUNTER — Encounter: Payer: Self-pay | Admitting: Anesthesiology

## 2016-07-28 ENCOUNTER — Ambulatory Visit: Payer: Medicare Other

## 2016-07-28 ENCOUNTER — Encounter
Admission: RE | Disposition: A | Discharge status: Home / Self Care | Payer: Self-pay | Source: Ambulatory Visit | Attending: Vascular Surgery

## 2016-07-28 ENCOUNTER — Ambulatory Visit: Payer: Medicare Other | Admitting: Anesthesiology

## 2016-07-28 ENCOUNTER — Ambulatory Visit
Admission: RE | Admit: 2016-07-28 | Discharge: 2016-07-28 | Disposition: A | Discharge status: Home / Self Care | Payer: Medicare Other | Source: Ambulatory Visit | Attending: Vascular Surgery | Admitting: Vascular Surgery

## 2016-07-28 DIAGNOSIS — Y831 Surgical operation with implant of artificial internal device as the cause of abnormal reaction of the patient, or of later complication, without mention of misadventure at the time of the procedure: Secondary | ICD-10-CM | POA: Insufficient documentation

## 2016-07-28 DIAGNOSIS — I10 Essential (primary) hypertension: Secondary | ICD-10-CM | POA: Diagnosis not present

## 2016-07-28 DIAGNOSIS — Z7982 Long term (current) use of aspirin: Secondary | ICD-10-CM | POA: Diagnosis not present

## 2016-07-28 DIAGNOSIS — Z7901 Long term (current) use of anticoagulants: Secondary | ICD-10-CM | POA: Diagnosis not present

## 2016-07-28 DIAGNOSIS — T827XXA Infection and inflammatory reaction due to other cardiac and vascular devices, implants and grafts, initial encounter: Secondary | ICD-10-CM | POA: Diagnosis not present

## 2016-07-28 DIAGNOSIS — Z79899 Other long term (current) drug therapy: Secondary | ICD-10-CM | POA: Insufficient documentation

## 2016-07-28 DIAGNOSIS — Z89611 Acquired absence of right leg above knee: Secondary | ICD-10-CM | POA: Insufficient documentation

## 2016-07-28 DIAGNOSIS — I739 Peripheral vascular disease, unspecified: Secondary | ICD-10-CM | POA: Diagnosis not present

## 2016-07-28 DIAGNOSIS — F17200 Nicotine dependence, unspecified, uncomplicated: Secondary | ICD-10-CM | POA: Insufficient documentation

## 2016-07-28 DIAGNOSIS — T8579XA Infection and inflammatory reaction due to other internal prosthetic devices, implants and grafts, initial encounter: Secondary | ICD-10-CM | POA: Diagnosis not present

## 2016-07-28 DIAGNOSIS — Z89612 Acquired absence of left leg above knee: Secondary | ICD-10-CM | POA: Insufficient documentation

## 2016-07-28 DIAGNOSIS — T82898A Other specified complication of vascular prosthetic devices, implants and grafts, initial encounter: Secondary | ICD-10-CM | POA: Diagnosis not present

## 2016-07-28 DIAGNOSIS — M795 Residual foreign body in soft tissue: Secondary | ICD-10-CM

## 2016-07-28 DIAGNOSIS — Z0389 Encounter for observation for other suspected diseases and conditions ruled out: Secondary | ICD-10-CM | POA: Diagnosis not present

## 2016-07-28 HISTORY — PX: FOREIGN BODY REMOVAL: SHX962

## 2016-07-28 LAB — PROTIME-INR
INR: 1.36
Prothrombin Time: 16.9 seconds — ABNORMAL HIGH (ref 11.4–15.2)

## 2016-07-28 LAB — BASIC METABOLIC PANEL
Anion gap: 7 (ref 5–15)
BUN: 17 mg/dL (ref 6–20)
CO2: 23 mmol/L (ref 22–32)
Calcium: 8.7 mg/dL — ABNORMAL LOW (ref 8.9–10.3)
Chloride: 105 mmol/L (ref 101–111)
Creatinine, Ser: 0.76 mg/dL (ref 0.44–1.00)
GFR calc Af Amer: >60 mL/min (ref >60)
GFR calc non Af Amer: >60 mL/min (ref >60)
Glucose, Bld: 84 mg/dL (ref 65–99)
Potassium: 3.5 mmol/L (ref 3.5–5.1)
Sodium: 135 mmol/L (ref 135–145)

## 2016-07-28 LAB — CBC WITH DIFFERENTIAL/PLATELET
Basophils Absolute: 0.1 10*3/uL (ref 0–0.1)
Basophils Relative: 1 %
Eosinophils Absolute: 0.1 10*3/uL (ref 0–0.7)
Eosinophils Relative: 1 %
HCT: 25.6 % — ABNORMAL LOW (ref 35.0–47.0)
Hemoglobin: 8 g/dL — ABNORMAL LOW (ref 12.0–16.0)
Lymphocytes Relative: 22 %
Lymphs Abs: 2.2 10*3/uL (ref 1.0–3.6)
MCH: 22.8 pg — ABNORMAL LOW (ref 26.0–34.0)
MCHC: 31 g/dL — ABNORMAL LOW (ref 32.0–36.0)
MCV: 73.5 fL — ABNORMAL LOW (ref 80.0–100.0)
Monocytes Absolute: 0.6 10*3/uL (ref 0.2–0.9)
Monocytes Relative: 6 %
Neutro Abs: 7 10*3/uL — ABNORMAL HIGH (ref 1.4–6.5)
Neutrophils Relative %: 70 %
Platelets: 415 10*3/uL (ref 150–440)
RBC: 3.49 MIL/uL — ABNORMAL LOW (ref 3.80–5.20)
RDW: 17.5 % — ABNORMAL HIGH (ref 11.5–14.5)
WBC: 10 10*3/uL (ref 3.6–11.0)

## 2016-07-28 LAB — TYPE AND SCREEN
ABO/RH(D): O POS
Antibody Screen: NEGATIVE

## 2016-07-28 LAB — SURGICAL PCR SCREEN
MRSA, PCR: NEGATIVE
Staphylococcus aureus: NEGATIVE

## 2016-07-28 LAB — APTT: aPTT: 37 seconds — ABNORMAL HIGH (ref 24–36)

## 2016-07-28 SURGERY — FOREIGN BODY REMOVAL ADULT
Anesthesia: General | Site: Leg Upper | Laterality: Left | Wound class: Dirty or Infected

## 2016-07-28 MED ORDER — CHLORHEXIDINE GLUCONATE CLOTH 2 % EX PADS: 6.0000 | MEDICATED_PAD | Freq: Once | CUTANEOUS | Status: DC

## 2016-07-28 MED ORDER — BUPIVACAINE HCL (PF) 0.5 % IJ SOLN
INTRAMUSCULAR | Status: AC
  Filled 2016-07-28: qty 30

## 2016-07-28 MED ORDER — FENTANYL CITRATE (PF) 100 MCG/2ML IJ SOLN: 25.0000 ug | INTRAMUSCULAR | Status: DC | PRN

## 2016-07-28 MED ORDER — CEFAZOLIN SODIUM-DEXTROSE 2-4 GM/100ML-% IV SOLN
2.0000 g | INTRAVENOUS | Status: AC
  Administered 2016-07-28: 2 g via INTRAVENOUS

## 2016-07-28 MED ORDER — ONDANSETRON HCL 4 MG/2ML IJ SOLN: 4.0000 mg | Freq: Once | INTRAMUSCULAR | Status: DC | PRN

## 2016-07-28 MED ORDER — FENTANYL CITRATE (PF) 100 MCG/2ML IJ SOLN
INTRAMUSCULAR | Status: DC | PRN
  Administered 2016-07-28 (×2): 50 ug via INTRAVENOUS

## 2016-07-28 MED ORDER — MIDAZOLAM HCL 2 MG/2ML IJ SOLN
INTRAMUSCULAR | Status: DC | PRN
  Administered 2016-07-28 (×2): 1 mg via INTRAVENOUS

## 2016-07-28 MED ORDER — BUPIVACAINE HCL 0.5 % IJ SOLN
INTRAMUSCULAR | Status: DC | PRN
  Administered 2016-07-28: 10 mL

## 2016-07-28 MED ORDER — OXYCODONE-ACETAMINOPHEN 5-325 MG PO TABS: 1.0000 | ORAL_TABLET | Freq: Four times a day (QID) | ORAL | 0 refills | Status: DC | PRN

## 2016-07-28 MED ORDER — PROPOFOL 10 MG/ML IV BOLUS
INTRAVENOUS | Status: DC | PRN
  Administered 2016-07-28 (×2): 20 mg via INTRAVENOUS
  Administered 2016-07-28: 10 mg via INTRAVENOUS

## 2016-07-28 MED ORDER — CEFAZOLIN SODIUM-DEXTROSE 2-4 GM/100ML-% IV SOLN
INTRAVENOUS | Status: AC
  Administered 2016-07-28: 2 g via INTRAVENOUS
  Filled 2016-07-28: qty 100

## 2016-07-28 MED ORDER — GLYCOPYRROLATE 0.2 MG/ML IJ SOLN
INTRAMUSCULAR | Status: DC | PRN
  Administered 2016-07-28: 0.2 mg via INTRAVENOUS

## 2016-07-28 MED ORDER — SODIUM CHLORIDE 0.9 % IV SOLN
INTRAVENOUS | Status: DC
  Administered 2016-07-28: 15:00:00 via INTRAVENOUS

## 2016-07-28 SURGICAL SUPPLY — 23 items
BNDG COHESIVE 6X5 TAN STRL LF (GAUZE/BANDAGES/DRESSINGS) ×2 IMPLANT
CANISTER SUCT 1200ML W/VALVE (MISCELLANEOUS) ×2 IMPLANT
CHLORAPREP W/TINT 26ML (MISCELLANEOUS) ×2 IMPLANT
DRSG EMULSION OIL 3X3 NADH (GAUZE/BANDAGES/DRESSINGS) ×2 IMPLANT
DRSG VAC ATS MED SENSATRAC (GAUZE/BANDAGES/DRESSINGS) ×2 IMPLANT
ELECT REM PT RETURN 9FT ADLT (ELECTROSURGICAL) ×2
ELECTRODE REM PT RTRN 9FT ADLT (ELECTROSURGICAL) ×1 IMPLANT
GAUZE SPONGE 4X4 12PLY STRL (GAUZE/BANDAGES/DRESSINGS) IMPLANT
GAUZE STRETCH 2X75IN STRL (MISCELLANEOUS) IMPLANT
GLOVE SURG SYN 8.0 (GLOVE) ×2 IMPLANT
GOWN STRL REUS W/ TWL LRG LVL3 (GOWN DISPOSABLE) ×1 IMPLANT
GOWN STRL REUS W/ TWL XL LVL3 (GOWN DISPOSABLE) ×1 IMPLANT
GOWN STRL REUS W/TWL LRG LVL3 (GOWN DISPOSABLE) ×1
GOWN STRL REUS W/TWL XL LVL3 (GOWN DISPOSABLE) ×1
KIT RM TURNOVER STRD PROC AR (KITS) ×2 IMPLANT
LABEL OR SOLS (LABEL) ×2 IMPLANT
NS IRRIG 500ML POUR BTL (IV SOLUTION) ×2 IMPLANT
PACK EXTREMITY ARMC (MISCELLANEOUS) ×2 IMPLANT
PAD PREP 24X41 OB/GYN DISP (PERSONAL CARE ITEMS) ×2 IMPLANT
SOL PREP PVP 2OZ (MISCELLANEOUS) ×2
SOLUTION PREP PVP 2OZ (MISCELLANEOUS) ×1 IMPLANT
STOCKINETTE IMPERV 14X48 (MISCELLANEOUS) ×2 IMPLANT
WND VAC CANISTER 500ML (MISCELLANEOUS) ×2 IMPLANT

## 2016-07-28 NOTE — Op Note (Addendum)
    OPERATIVE NOTE   PROCEDURE: Excision of infected stent left thigh  PRE-OPERATIVE DIAGNOSIS: Infected SFA stent left thigh, atherosclerotic occlusive disease bilateral lower extremities with bilateral above-knee amputations, history of intravascular stenting with complication of vascular device  POST-OPERATIVE DIAGNOSIS: Same  SURGEON: Levora DredgeGregory Shalandra Leu  ASSISTANT(S): Ms. Raul DelKim Stegmayer  ANESTHESIA: MAC  ESTIMATED BLOOD LOSS: Minimal cc  FINDING(S): Viabahn stent removed intact  SPECIMEN(S):  Viabahn stent  INDICATIONS:   [name] is a 70 y.o. female who presents with painful foreign body protruding from the distal aspect of the left above-knee application site.   DESCRIPTION: After full informed written consent was obtained from the patient, the patient was brought back to the operating room and placed supine upon the operating table.  Prior to induction, the patient received IV antibiotics.   After obtaining adequate anesthesia, the patient was then prepped and draped in the standard fashion for a  left above-knee amputation stump exploration.  The stent was then freed from the surrounding tissues. Quarter percent Marcaine without epinephrine was infiltrated into the tissue surrounding the stent and using blunt dissection and traction the stent was removed in its entirety. Both ends were intact.  Deep tissues including fascia and muscle were depleted with a curet and the wound was then irrigated with sterile saline. The wound was then packed with iodoform gauze. Dry fluff gauze dressing was applied.   The patient tolerated this procedure well.   COMPLICATIONS: None  CONDITION: Velna HatchetGood   [facility]  Office: [phone]   07/28/2016, 4:37 PM

## 2016-07-28 NOTE — Transfer of Care (Signed)
Immediate Anesthesia Transfer of Care Note  Patient: [name]  Procedure(s) Performed: Procedure(s): FOREIGN BODY REMOVAL ADULT ( LEG ) (Left)  Patient Location: PACU  Anesthesia Type:General  Level of Consciousness: awake, alert  and oriented  Airway & Oxygen Therapy: Patient Spontanous Breathing  Post-op Assessment: Report given to RN  Post vital signs: Reviewed and stable  Last Vitals:  Vitals:   07/28/16 1429 07/28/16 1640  BP: (!) 84/61 101/65  Pulse: 88 (!) 113  Resp: 20 17  Temp: 36.7 C 37.1 C    Last Pain:  Vitals:   07/28/16 1640  TempSrc: Tympanic         Complications: No apparent anesthesia complications

## 2016-07-28 NOTE — Anesthesia Postprocedure Evaluation (Signed)
Anesthesia Post Note  Patient: [name]  Procedure(s) Performed: Procedure(s) (LRB): FOREIGN BODY REMOVAL ADULT ( LEG ) (Left)  Patient location during evaluation: PACU Anesthesia Type: General Level of consciousness: awake and alert Pain management: pain level controlled Vital Signs Assessment: post-procedure vital signs reviewed and stable Respiratory status: spontaneous breathing, nonlabored ventilation, respiratory function stable and patient connected to nasal cannula oxygen Cardiovascular status: blood pressure returned to baseline and stable Postop Assessment: no signs of nausea or vomiting Anesthetic complications: no    Last Vitals:  Vitals:   07/28/16 1720 07/28/16 1738  BP: (!) 145/69 134/73  Pulse: 97 95  Resp: 16 18  Temp: 36.2 C     Last Pain:  Vitals:   07/28/16 1720  TempSrc: Temporal                 Allora Bains S

## 2016-07-28 NOTE — Anesthesia Procedure Notes (Signed)
Procedure Name: MAC Date/Time: 07/28/2016 4:07 PM Performed by: Ginger CarneMICHELET, Waldon Sheerin Pre-anesthesia Checklist: Patient identified, Emergency Drugs available, Suction available, Patient being monitored and Timeout performed Patient Re-evaluated:Patient Re-evaluated prior to inductionOxygen Delivery Method: Simple face mask

## 2016-07-28 NOTE — Discharge Instructions (Signed)

## 2016-07-28 NOTE — Anesthesia Preprocedure Evaluation (Signed)
Anesthesia Evaluation  Patient identified by MRN, date of birth, ID band Patient awake    Reviewed: Allergy & Precautions, NPO status , Patient's Chart, lab work & pertinent test results, reviewed documented beta blocker date and time   Airway Mallampati: II  TM Distance: >3 FB     Dental  (+) Chipped   Pulmonary pneumonia, resolved, Current Smoker,           Cardiovascular hypertension, Pt. on medications + Peripheral Vascular Disease       Neuro/Psych    GI/Hepatic   Endo/Other    Renal/GU      Musculoskeletal   Abdominal   Peds  Hematology   Anesthesia Other Findings Heart rate high preop.  Reproductive/Obstetrics                             Anesthesia Physical Anesthesia Plan  ASA: III  Anesthesia Plan: General   Post-op Pain Management:    Induction: Intravenous  Airway Management Planned: LMA  Additional Equipment:   Intra-op Plan:   Post-operative Plan:   Informed Consent: I have reviewed the patients History and Physical, chart, labs and discussed the procedure including the risks, benefits and alternatives for the proposed anesthesia with the patient or authorized representative who has indicated his/her understanding and acceptance.     Plan Discussed with: CRNA  Anesthesia Plan Comments:         Anesthesia Quick Evaluation

## 2016-07-28 NOTE — H&P (Signed)
[facility] History & Physical Update  The patient was interviewed and re-examined.  The patient's previous History and Physical has been reviewed and is unchanged.  There is no change in the plan of care. We plan to proceed with the scheduled procedure.  Levora DredgeGregory Ashawn Rinehart, MD  07/28/2016, 3:42 PM

## 2016-07-31 ENCOUNTER — Encounter: Payer: Self-pay | Admitting: Vascular Surgery

## 2016-08-01 LAB — AEROBIC CULTURE W GRAM STAIN (SUPERFICIAL SPECIMEN)

## 2016-08-01 LAB — AEROBIC CULTURE  (SUPERFICIAL SPECIMEN): Culture: NORMAL

## 2016-08-02 LAB — ANAEROBIC CULTURE

## 2016-08-03 ENCOUNTER — Ambulatory Visit (INDEPENDENT_AMBULATORY_CARE_PROVIDER_SITE_OTHER): Payer: Medicare Other | Admitting: Vascular Surgery

## 2016-08-03 ENCOUNTER — Encounter (INDEPENDENT_AMBULATORY_CARE_PROVIDER_SITE_OTHER): Payer: Self-pay | Admitting: Vascular Surgery

## 2016-08-03 VITALS — BP 123/69 | HR 93 | Resp 16 | Ht 65.0 in | Wt 105.0 lb

## 2016-08-03 DIAGNOSIS — S78112D Complete traumatic amputation at level between left hip and knee, subsequent encounter: Secondary | ICD-10-CM

## 2016-08-03 DIAGNOSIS — Z89611 Acquired absence of right leg above knee: Secondary | ICD-10-CM

## 2016-08-03 DIAGNOSIS — I749 Embolism and thrombosis of unspecified artery: Secondary | ICD-10-CM

## 2016-08-03 DIAGNOSIS — I7090 Unspecified atherosclerosis: Secondary | ICD-10-CM

## 2016-08-03 DIAGNOSIS — Z89612 Acquired absence of left leg above knee: Secondary | ICD-10-CM

## 2016-08-03 DIAGNOSIS — I7025 Atherosclerosis of native arteries of other extremities with ulceration: Secondary | ICD-10-CM

## 2016-08-05 NOTE — Progress Notes (Signed)
Patient ID: [name], female   DOB: [birth year], 70 y.o.   MRN: [id number]  Chief Complaint  Patient presents with  . Re-evaluation    Follow up     HPI [name] is a 70 y.o. female.  She returns s/p stent removal from her left AKA stump.  She notes the pain is under good control  She is doing dressing changes daily   Past Medical History:  Diagnosis Date  . Hypertension   . Peripheral vascular disease (HCC)   . Pneumonia 07/14/15  . Tobacco abuse   . Unilateral small kidney    a. one functional kidney    Past Surgical History:  Procedure Laterality Date  . ABOVE KNEE LEG AMPUTATION Right 01/2015  . AMPUTATION Left 07/16/2015   Procedure: AMPUTATION ABOVE KNEE;  Surgeon: Renford Dills, MD;  Location: [facility];  Service: Vascular;  Laterality: Left;  . AMPUTATION Left 07/23/2015   Procedure:   WOUND CLOSURE;  Surgeon: Renford Dills, MD;  Location: [facility];  Service: Vascular;  Laterality: Left;  . AMPUTATION Left 11/19/2015   Procedure:  ( REVISION ) AMPUTATION ABOVE KNEE ;  Surgeon: Renford Dills, MD;  Location: [facility];  Service: Vascular;  Laterality: Left;  . APPENDECTOMY  1969  . ECTOPIC PREGNANCY SURGERY Right 1969  . FOREIGN BODY REMOVAL Left 07/28/2016   Procedure: FOREIGN BODY REMOVAL ADULT ( LEG );  Surgeon: Renford Dills, MD;  Location: [facility];  Service: Vascular;  Laterality: Left;  . PERIPHERAL VASCULAR CATHETERIZATION N/A 07/22/2015   Procedure: Abdominal Aortogram w/Lower Extremity;  Surgeon: Annice Needy, MD;  Location: [facility];  Service: Cardiovascular;  Laterality: N/A;  . PERIPHERAL VASCULAR CATHETERIZATION  07/22/2015   Procedure: Lower Extremity Intervention;  Surgeon: Annice Needy, MD;  Location: [facility];  Service: Cardiovascular;;  . stents left leg        Allergies  Allergen Reactions  . Morphine And Related Diarrhea and Nausea And Vomiting    Current Outpatient Prescriptions    Medication Sig Dispense Refill  . aspirin 81 MG tablet Take 81 mg by mouth every morning.     Marland Kitchen atorvastatin (LIPITOR) 40 MG tablet Take 1 tablet (40 mg total) by mouth daily at 6 PM. (Patient taking differently: Take 40 mg by mouth at bedtime. ) 30 tablet 0  . clindamycin (CLEOCIN) 300 MG capsule Take 1 capsule (300 mg total) by mouth 4 (four) times daily. 40 capsule 0  . famotidine (PEPCID) 20 MG tablet Take 20 mg by mouth every morning.     Marland Kitchen lisinopril (PRINIVIL,ZESTRIL) 20 MG tablet     . magnesium hydroxide (MILK OF MAGNESIA) 400 MG/5ML suspension Take 30 mLs by mouth daily as needed for mild constipation.    Marland Kitchen oxyCODONE-acetaminophen (ROXICET) 5-325 MG tablet Take 1-2 tablets by mouth every 6 (six) hours as needed for moderate pain or severe pain. 50 tablet 0  . traMADol (ULTRAM) 50 MG tablet Take 50 mg by mouth as needed.    . warfarin (COUMADIN) 2 MG tablet Take 2 mg by mouth as directed. 3 days a week Monday, Wednesday and Friday.    . warfarin (COUMADIN) 5 MG tablet Take 2.5-5 mg by mouth daily. Takes 2 mg on Monday, Wednesday and Friday. All other days take 5mg      No current facility-administered medications for this visit.         Physical Exam BP 123/69 (BP  Location: Left Arm)   Pulse 93   Resp 16   Ht 5\' 5"  (1.651 m)   Wt 105 lb (47.6 kg)   BMI 17.47 kg/m  Gen:  WD/WN, NAD Skin: incision mild drainage minimal erythremia  Culture is normal skin flora     Assessment/Plan:  Continue dressing changes  Complete antibiotics Follow up in 3 weeks     Levora DredgeGregory Kateleen Encarnacion 08/05/2016, 5:08 PM   This note was created with Dragon medical transcription system.  Any errors from dictation are unintentional.

## 2016-08-24 ENCOUNTER — Encounter (INDEPENDENT_AMBULATORY_CARE_PROVIDER_SITE_OTHER): Payer: Self-pay | Admitting: Vascular Surgery

## 2016-08-24 ENCOUNTER — Ambulatory Visit (INDEPENDENT_AMBULATORY_CARE_PROVIDER_SITE_OTHER): Payer: Medicare Other | Admitting: Vascular Surgery

## 2016-08-24 VITALS — BP 118/69 | HR 91 | Resp 16 | Ht 65.0 in | Wt 105.0 lb

## 2016-08-24 DIAGNOSIS — T829XXS Unspecified complication of cardiac and vascular prosthetic device, implant and graft, sequela: Secondary | ICD-10-CM

## 2016-08-24 DIAGNOSIS — Z89612 Acquired absence of left leg above knee: Secondary | ICD-10-CM

## 2016-08-24 DIAGNOSIS — S78112S Complete traumatic amputation at level between left hip and knee, sequela: Secondary | ICD-10-CM

## 2016-08-24 DIAGNOSIS — Z89611 Acquired absence of right leg above knee: Secondary | ICD-10-CM

## 2016-08-24 DIAGNOSIS — I7025 Atherosclerosis of native arteries of other extremities with ulceration: Secondary | ICD-10-CM

## 2016-08-24 NOTE — Progress Notes (Signed)
rm 2 

## 2016-08-24 NOTE — Progress Notes (Signed)
[facility] Admission History & Physical  MRN : [id number]  [name] is a 70 y.o. (1946-07-24) female who presents with chief complaint of  Chief Complaint  Patient presents with  . Follow-up  .  History of Present Illness: no stump pain, no drainage  Current Meds  Medication Sig  . aspirin 81 MG tablet Take 81 mg by mouth every morning.   Marland Kitchen. atorvastatin (LIPITOR) 40 MG tablet Take 1 tablet (40 mg total) by mouth daily at 6 PM. (Patient taking differently: Take 40 mg by mouth at bedtime. )  . famotidine (PEPCID) 20 MG tablet Take 20 mg by mouth every morning.   Marland Kitchen. lisinopril (PRINIVIL,ZESTRIL) 20 MG tablet   . magnesium hydroxide (MILK OF MAGNESIA) 400 MG/5ML suspension Take 30 mLs by mouth daily as needed for mild constipation.  Marland Kitchen. oxyCODONE-acetaminophen (ROXICET) 5-325 MG tablet Take 1-2 tablets by mouth every 6 (six) hours as needed for moderate pain or severe pain.  . traMADol (ULTRAM) 50 MG tablet Take 50 mg by mouth as needed.  . warfarin (COUMADIN) 2 MG tablet Take 2 mg by mouth as directed. 3 days a week Monday, Wednesday and Friday.  . warfarin (COUMADIN) 5 MG tablet Take 2.5-5 mg by mouth daily. Takes 2 mg on Monday, Wednesday and Friday. All other days take 5mg     Past Medical History:  Diagnosis Date  . Hypertension   . Peripheral vascular disease (HCC)   . Pneumonia 07/14/15  . Tobacco abuse   . Unilateral small kidney    a. one functional kidney    Past Surgical History:  Procedure Laterality Date  . ABOVE KNEE LEG AMPUTATION Right 01/2015  . AMPUTATION Left 07/16/2015   Procedure: AMPUTATION ABOVE KNEE;  Surgeon: Renford DillsGregory G Tu Bayle, MD;  Location: [facility];  Service: Vascular;  Laterality: Left;  . AMPUTATION Left 07/23/2015   Procedure:   WOUND CLOSURE;  Surgeon: Renford DillsGregory G David Towson, MD;  Location: [facility];  Service: Vascular;  Laterality: Left;  . AMPUTATION Left 11/19/2015   Procedure:  ( REVISION ) AMPUTATION ABOVE KNEE ;   Surgeon: Renford DillsGregory G Markeis Allman, MD;  Location: [facility];  Service: Vascular;  Laterality: Left;  . APPENDECTOMY  1969  . ECTOPIC PREGNANCY SURGERY Right 1969  . FOREIGN BODY REMOVAL Left 07/28/2016   Procedure: FOREIGN BODY REMOVAL ADULT ( LEG );  Surgeon: Renford DillsGregory G Kailin Leu, MD;  Location: [facility];  Service: Vascular;  Laterality: Left;  . PERIPHERAL VASCULAR CATHETERIZATION N/A 07/22/2015   Procedure: Abdominal Aortogram w/Lower Extremity;  Surgeon: Annice NeedyJason S Dew, MD;  Location: [facility];  Service: Cardiovascular;  Laterality: N/A;  . PERIPHERAL VASCULAR CATHETERIZATION  07/22/2015   Procedure: Lower Extremity Intervention;  Surgeon: Annice NeedyJason S Dew, MD;  Location: [facility];  Service: Cardiovascular;;  . stents left leg      Social History Social History  Substance Use Topics  . Smoking status: Light Tobacco Smoker    Packs/day: 0.25    Years: 55.00    Last attempt to quit: 01/20/2015  . Smokeless tobacco: Never Used  . Alcohol use No    Family History Family History  Problem Relation Age of Onset  . CAD Mother   . Hypertension Mother   . Stroke Mother   . CAD Father   . Hypertension Father     Allergies  Allergen Reactions  . Morphine And Related Diarrhea and Nausea And Vomiting     REVIEW OF SYSTEMS (Negative  unless checked)  Constitutional: [] Weight loss  [] Fever  [] Chills Cardiac: [] Chest pain   [] Chest pressure   [] Palpitations   [] Shortness of breath when laying flat   [] Shortness of breath with exertion. Vascular:  [] Pain in legs with walking   [] Pain in legs at rest  [] History of DVT   [] Phlebitis   [] Swelling in legs   [] Varicose veins   [] Non-healing ulcers Pulmonary:   [] Uses home oxygen   [] Productive cough   [] Hemoptysis   [] Wheeze  [] COPD   [] Asthma Neurologic:  [] Dizziness   [] Seizures   [] History of stroke   [] History of TIA  [] Aphasia   [] Vissual changes   [] Weakness or numbness in arm   [] Weakness or numbness in leg Musculoskeletal:    [] Joint swelling   [] Joint pain   [] Low back pain Hematologic:  [] Easy bruising  [] Easy bleeding   [] Hypercoagulable state   [] Anemic Gastrointestinal:  [] Diarrhea   [] Vomiting  [] Gastroesophageal reflux/heartburn   [] Difficulty swallowing. Genitourinary:  [] Chronic kidney disease   [] Difficult urination  [] Frequent urination   [] Blood in urine Skin:  [] Rashes   [] Ulcers  Psychological:  [] History of anxiety   []  History of major depression.  Physical Examination  Vitals:   08/24/16 1505  BP: 118/69  Pulse: 91  Resp: 16  Weight: 105 lb (47.6 kg)  Height: 5\' 5"  (1.651 m)   Body mass index is 17.47 kg/m. Gen: WD/WN, NAD Head: [place]/AT, No temporalis wasting.  Ear/Nose/Throat: Hearing grossly intact, nares w/o erythema or drainage, poor dentition Eyes: PER, EOMI, sclera nonicteric.  Neck: Supple, no masses.  No bruit or JVD.  Pulmonary:  Good air movement, clear to auscultation bilaterally, no use of accessory muscles.  Cardiac: RRR, normal S1, S2, no Murmurs. Vascular: wound now healed Vessel Right Left  Radial Palpable Palpable  Ulnar Palpable Palpable  Brachial Palpable Palpable  Carotid Palpable Palpable  Femoral Not Palpable Not Palpable  Popliteal AKA AKA  PT AKA AKA  DP AKA AKA   Gastrointestinal: soft, non-distended. No guarding/no peritoneal signs.  Musculoskeletal: M/S 5/5 throughout.  No deformity or atrophy.  Neurologic: CN 2-12 intact. Pain and light touch intact in extremities.  Symmetrical.  Speech is fluent. Motor exam as listed above. Psychiatric: Judgment intact, Mood & affect appropriate for pt's clinical situation. Dermatologic: No rashes or ulcers noted.  No changes consistent with cellulitis. Lymph : No Cervical lymphadenopathy, no lichenification or skin changes of chronic lymphedema.  CBC Lab Results  Component Value Date   WBC 10.0 07/28/2016   HGB 8.0 (L) 07/28/2016   HCT 25.6 (L) 07/28/2016   MCV 73.5 (L) 07/28/2016   PLT 415 07/28/2016     BMET    Component Value Date/Time   NA 135 07/28/2016 1353   NA 131 (L) 02/13/2015 0426   K 3.5 07/28/2016 1353   K 3.0 (L) 02/13/2015 0426   CL 105 07/28/2016 1353   CL 103 02/13/2015 0426   CO2 23 07/28/2016 1353   CO2 23 02/13/2015 0426   GLUCOSE 84 07/28/2016 1353   GLUCOSE 104 (H) 02/13/2015 0426   BUN 17 07/28/2016 1353   BUN 10 02/13/2015 0426   CREATININE 0.76 07/28/2016 1353   CREATININE 0.83 02/13/2015 0426   CALCIUM 8.7 (L) 07/28/2016 1353   CALCIUM 7.3 (L) 02/13/2015 0426   GFRNONAA >60 07/28/2016 1353   GFRNONAA >60 02/13/2015 0426   GFRAA >60 07/28/2016 1353   GFRAA >60 02/13/2015 0426   CrCl cannot be calculated (Patient's most recent  lab result is older than the maximum 21 days allowed.).  COAG Lab Results  Component Value Date   INR 1.36 07/28/2016   INR 2.6 07/24/2016   INR 1.07 11/19/2015    Radiology Dg Femur Port 1v Left  Result Date: 07/28/2016 CLINICAL DATA:  Preoperative evaluation EXAM: LEFT FEMUR PORTABLE 1 VIEW COMPARISON:  None. FINDINGS: Patient is status post AKA. Bones are markedly demineralized. Nondisplaced fracture left femoral neck not excluded. Extensive vascular stent/graft identified along the left pelvic sidewall and left thigh. IMPRESSION: Status post left above-the-knee amputation with marked bony demineralization. Nondisplaced proximal left femur fracture not excluded on this study. Electronically Signed   By: Kennith Center M.D.   On: 07/28/2016 14:44    Assessment/Plan 1. Complications due to vascular device, implant, and graft, sequela Recommend:  The patient has experienced increased symptoms and is now describing lifestyle limiting claudication and mild rest pain.  Given the severity of the patient's lower extremity symptoms the patient should undergo angiography and intervention.  Risk and benefits were reviewed the patient.  Indications for the procedure were reviewed.  All questions were answered, the patient agrees  to proceed.   The patient should continue walking and begin a more formal exercise program.  The patient should continue antiplatelet therapy and aggressive treatment of the lipid abnormalities  The patient will follow up with me after the angiogram.   2. Atherosclerosis of native arteries of the extremities with ulceration (HCC) Ulcer now healed  3. 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 Status post bilateral above knee amputation (HCC) Continue compression    Levora Dredge, MD  08/24/2016 3:34 PM

## 2016-08-28 ENCOUNTER — Ambulatory Visit (INDEPENDENT_AMBULATORY_CARE_PROVIDER_SITE_OTHER): Payer: Medicare Other

## 2016-08-31 DIAGNOSIS — G546 Phantom limb syndrome with pain: Secondary | ICD-10-CM | POA: Diagnosis not present

## 2016-08-31 DIAGNOSIS — Z5181 Encounter for therapeutic drug level monitoring: Secondary | ICD-10-CM | POA: Diagnosis not present

## 2016-08-31 DIAGNOSIS — Z89619 Acquired absence of unspecified leg above knee: Secondary | ICD-10-CM | POA: Diagnosis not present

## 2016-08-31 DIAGNOSIS — I70223 Atherosclerosis of native arteries of extremities with rest pain, bilateral legs: Secondary | ICD-10-CM | POA: Diagnosis not present

## 2016-08-31 DIAGNOSIS — Z0001 Encounter for general adult medical examination with abnormal findings: Secondary | ICD-10-CM | POA: Diagnosis not present

## 2016-09-04 ENCOUNTER — Other Ambulatory Visit: Payer: Self-pay | Admitting: Cardiovascular Disease

## 2016-10-02 DIAGNOSIS — I70223 Atherosclerosis of native arteries of extremities with rest pain, bilateral legs: Secondary | ICD-10-CM | POA: Diagnosis not present

## 2016-10-02 DIAGNOSIS — Z7901 Long term (current) use of anticoagulants: Secondary | ICD-10-CM | POA: Diagnosis not present

## 2016-10-02 DIAGNOSIS — G4701 Insomnia due to medical condition: Secondary | ICD-10-CM | POA: Diagnosis not present

## 2016-10-02 DIAGNOSIS — G894 Chronic pain syndrome: Secondary | ICD-10-CM | POA: Diagnosis not present

## 2016-10-02 DIAGNOSIS — M810 Age-related osteoporosis without current pathological fracture: Secondary | ICD-10-CM | POA: Diagnosis not present

## 2016-11-15 ENCOUNTER — Ambulatory Visit
Admission: RE | Admit: 2016-11-15 | Discharge: 2016-11-15 | Disposition: A | Discharge status: Home / Self Care | Payer: Medicare Other | Source: Ambulatory Visit | Attending: Physician Assistant | Admitting: Physician Assistant

## 2016-11-15 DIAGNOSIS — Z1231 Encounter for screening mammogram for malignant neoplasm of breast: Secondary | ICD-10-CM

## 2016-11-15 DIAGNOSIS — M810 Age-related osteoporosis without current pathological fracture: Secondary | ICD-10-CM | POA: Diagnosis not present

## 2016-11-15 DIAGNOSIS — M8580 Other specified disorders of bone density and structure, unspecified site: Secondary | ICD-10-CM | POA: Diagnosis not present

## 2016-12-24 ENCOUNTER — Other Ambulatory Visit: Payer: Self-pay | Admitting: Cardiovascular Disease

## 2016-12-25 NOTE — Telephone Encounter (Signed)
Pt needs f/u appt with Arida for further refills. Thank you!

## 2016-12-30 ENCOUNTER — Other Ambulatory Visit: Payer: Self-pay | Admitting: Cardiovascular Disease

## 2017-01-01 NOTE — Telephone Encounter (Signed)
s/w pt regarding lisinopril refill requests. She will see Dr. Kirke CorinArida on an as needed basis and understands her PCP may refill lisinopril. She is agreeable w/plan as she has an OV w/PCP in April.  One month refill was submitted March 12.

## 2017-01-01 NOTE — Telephone Encounter (Signed)
Patient was last seen January of 2017 and was to follow up as needed. Is this medication okay to refill.

## 2017-01-09 DIAGNOSIS — M810 Age-related osteoporosis without current pathological fracture: Secondary | ICD-10-CM | POA: Diagnosis not present

## 2017-01-09 DIAGNOSIS — I70223 Atherosclerosis of native arteries of extremities with rest pain, bilateral legs: Secondary | ICD-10-CM | POA: Diagnosis not present

## 2017-01-09 DIAGNOSIS — G894 Chronic pain syndrome: Secondary | ICD-10-CM | POA: Diagnosis not present

## 2017-01-09 DIAGNOSIS — Z7901 Long term (current) use of anticoagulants: Secondary | ICD-10-CM | POA: Diagnosis not present

## 2017-02-02 ENCOUNTER — Other Ambulatory Visit: Payer: Self-pay | Admitting: Cardiovascular Disease

## 2017-02-08 IMAGING — CT CT HEAD WITHOUT CONTRAST
1 series · 16 of 30 positions shown, 20 images · non-contrast
Comparison: None.

CLINICAL DATA: Fall last night, hit right face and head on cabinet

EXAM:
CT HEAD WITHOUT CONTRAST
TECHNIQUE: Contiguous axial images were obtained from the base of the skull
through the vertex without intravenous contrast.

[Series 2: head wo · axial · 0.40mm/px · z∈[-37,+98]mm · 16 of 30 slices shown, 20 images]
[im 2/30  brain]
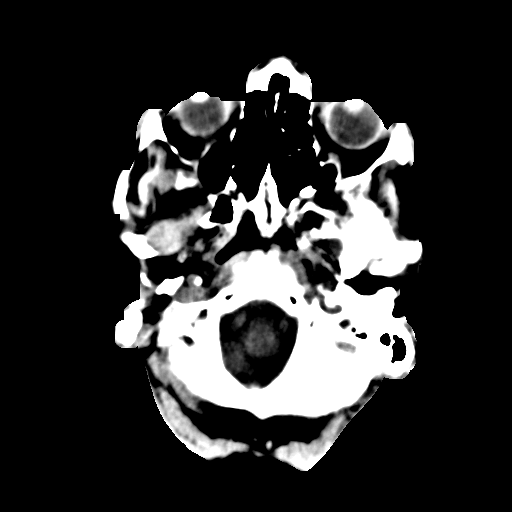
[im 2/30  bone]
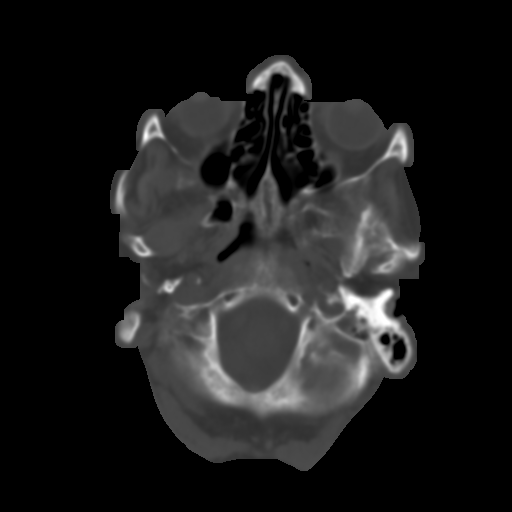
[im 4/30  brain]
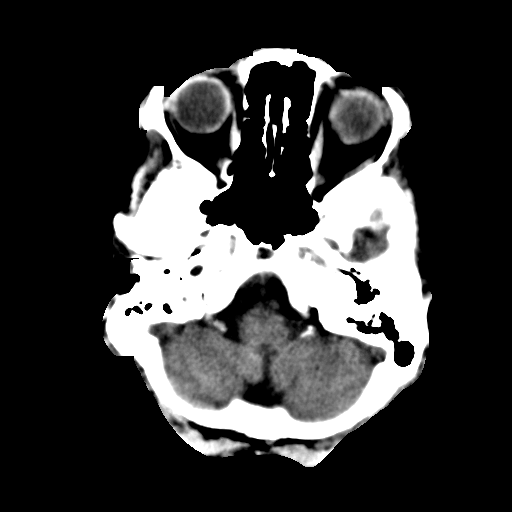
[im 6/30  brain]
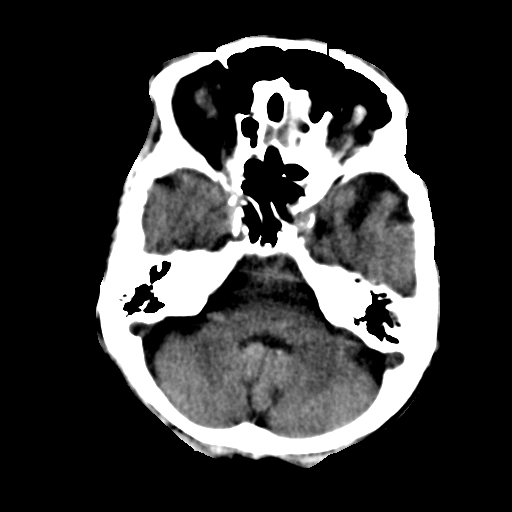
[im 8/30  brain]
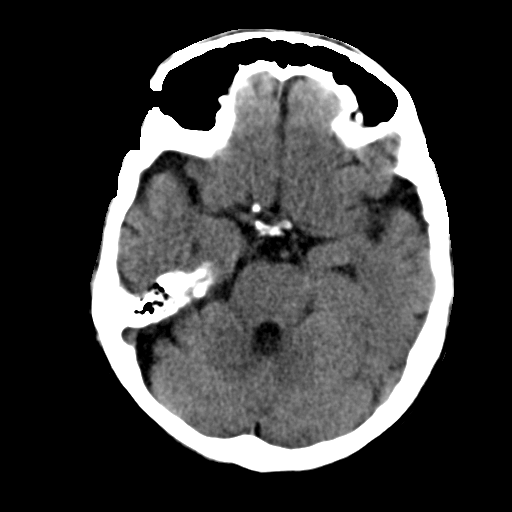
[im 9/30  brain]
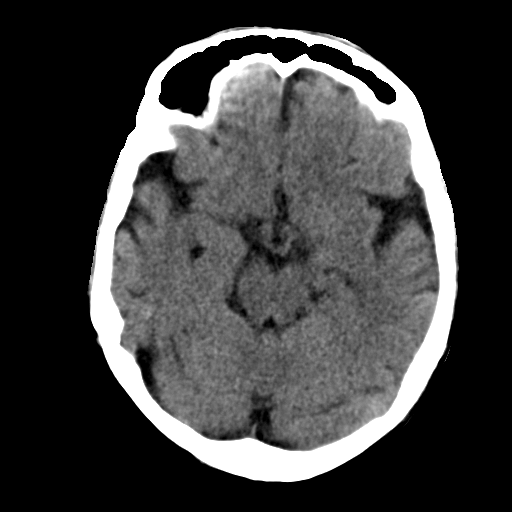
[im 9/30  bone]
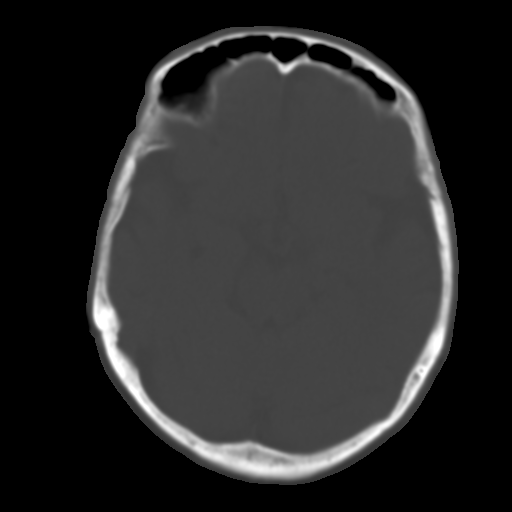
[im 11/30  brain]
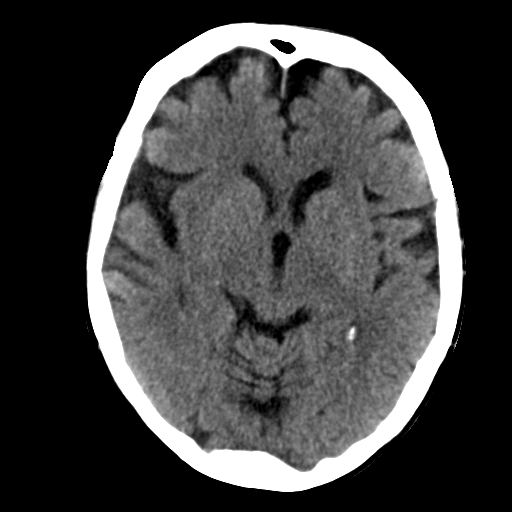
[im 13/30  brain]
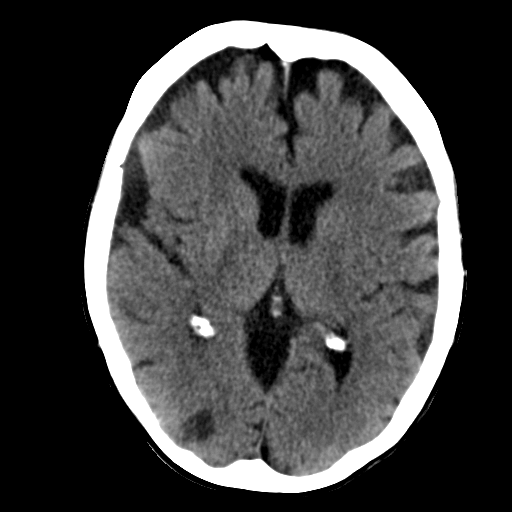
[im 15/30  brain]
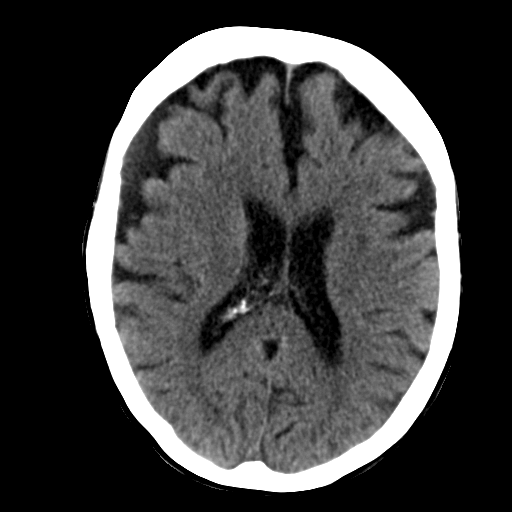
[im 16/30  brain]
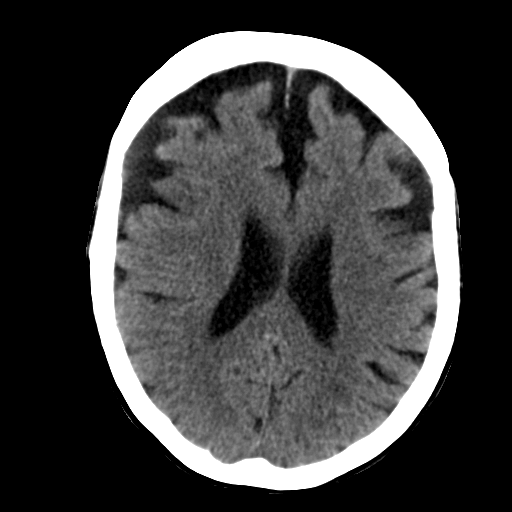
[im 16/30  bone]
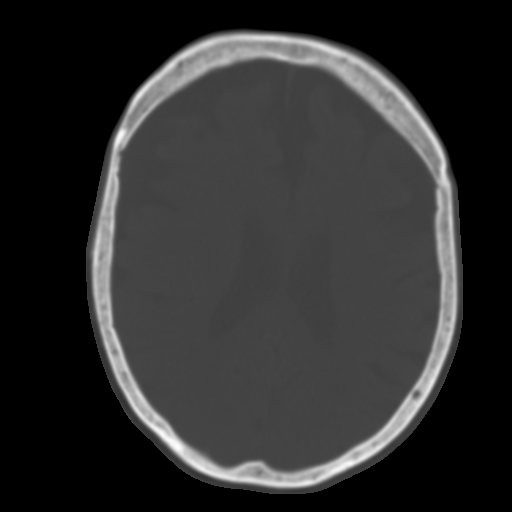
[im 18/30  brain]
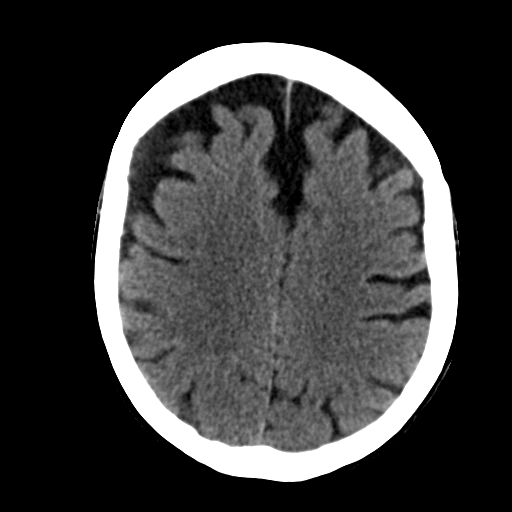
[im 20/30  brain]
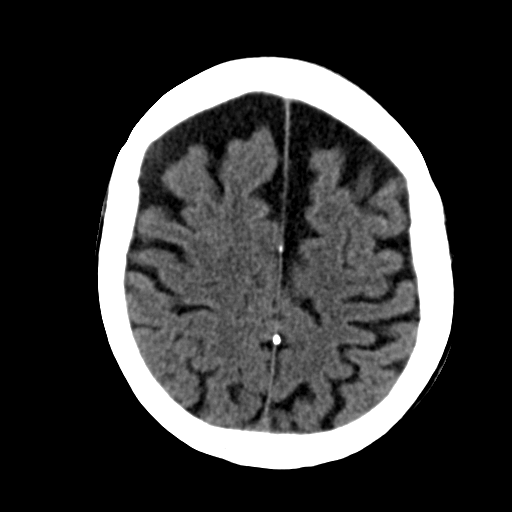
[im 22/30  brain]
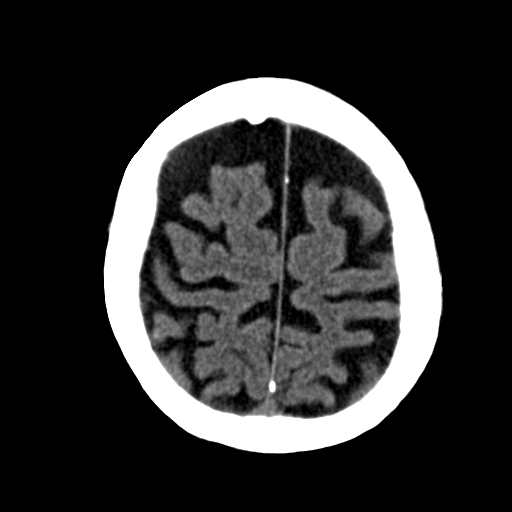
[im 23/30  brain]
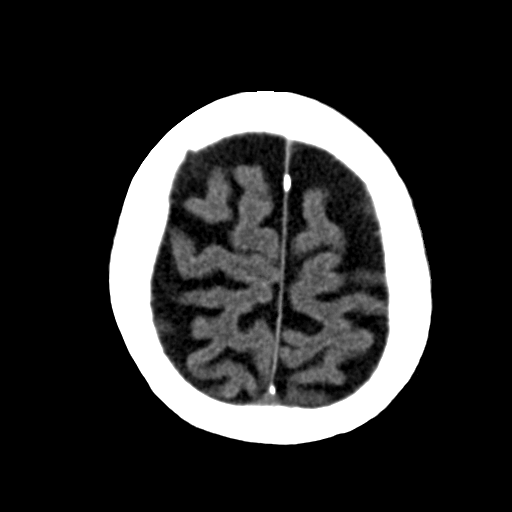
[im 23/30  bone]
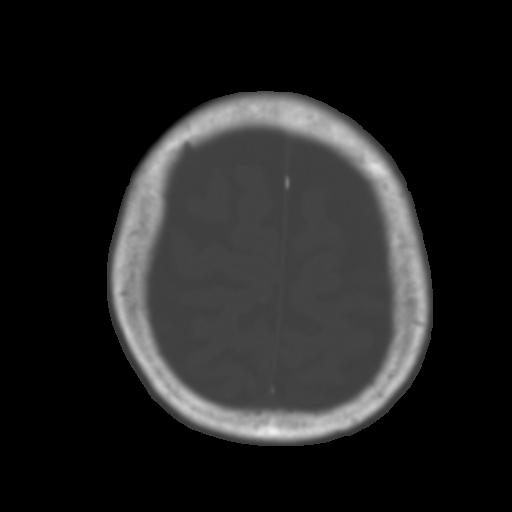
[im 25/30  brain]
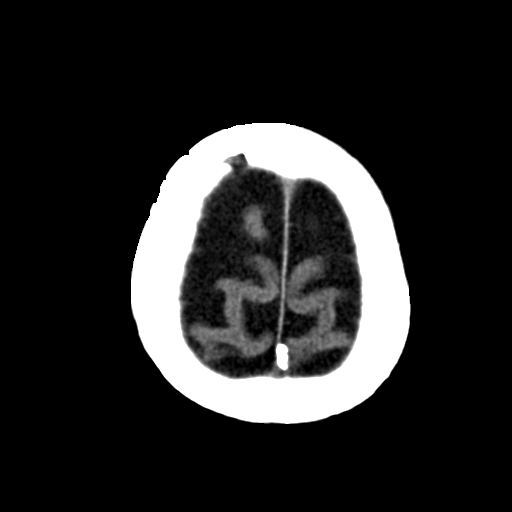
[im 27/30  brain]
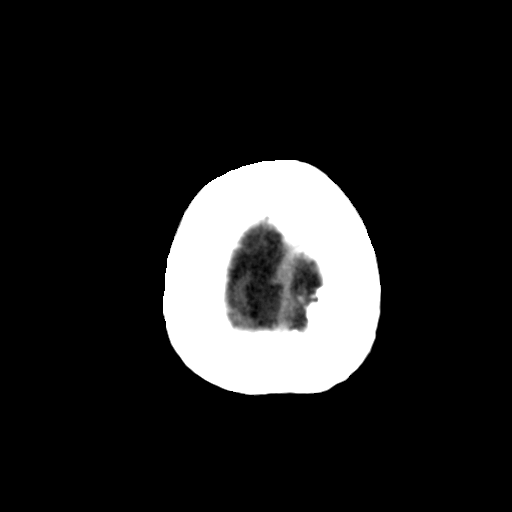
[im 29/30  brain]
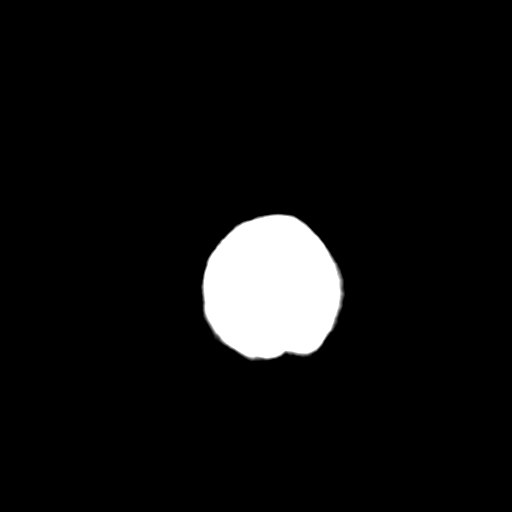

[16 of 30 positions shown; findings below may reference images not displayed]

FINDINGS: No skull fracture is noted. Paranasal sinuses and mastoid air cells
are unremarkable. No intracranial hemorrhage, mass effect or midline
shift. Moderate cerebral atrophy. No acute cortical infarction.
There is small lacunar infarct in right occipital lobe.

No mass lesion is noted on this unenhanced scan.
IMPRESSION: No acute intracranial abnormality. Moderate cerebral atrophy. Small
lacunar infarct in right occipital lobe. No acute cortical
infarction. Mild periventricular white matter decreased attenuation
probable due to chronic small vessel ischemic changes.

## 2017-02-09 IMAGING — CR DG CHEST 1V PORT
1 series · 2 of 2 positions shown · non-contrast
Comparison: None.

CLINICAL DATA: Central line placement

EXAM:
PORTABLE CHEST - 1 VIEW

[Series 1: ap · 0.17mm/px · 2 of 2 slices shown]
[im 1/2]
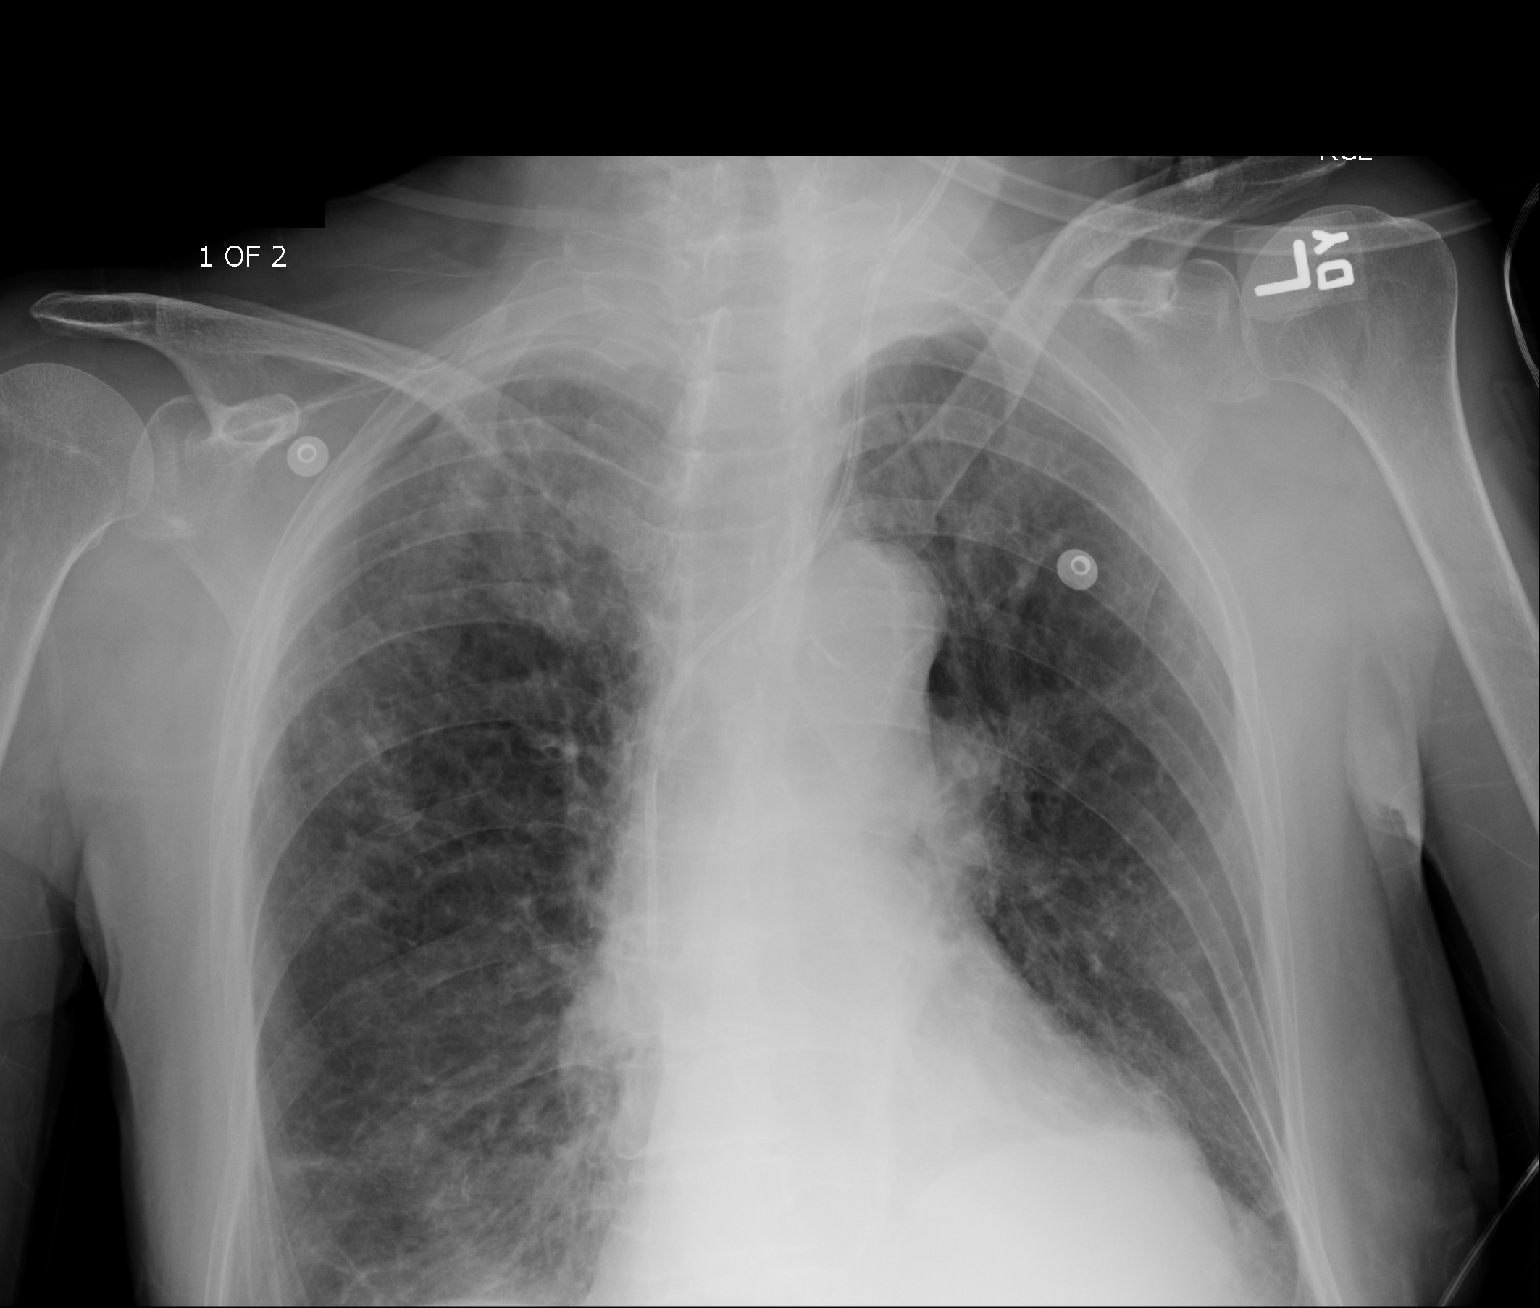
[im 2/2]
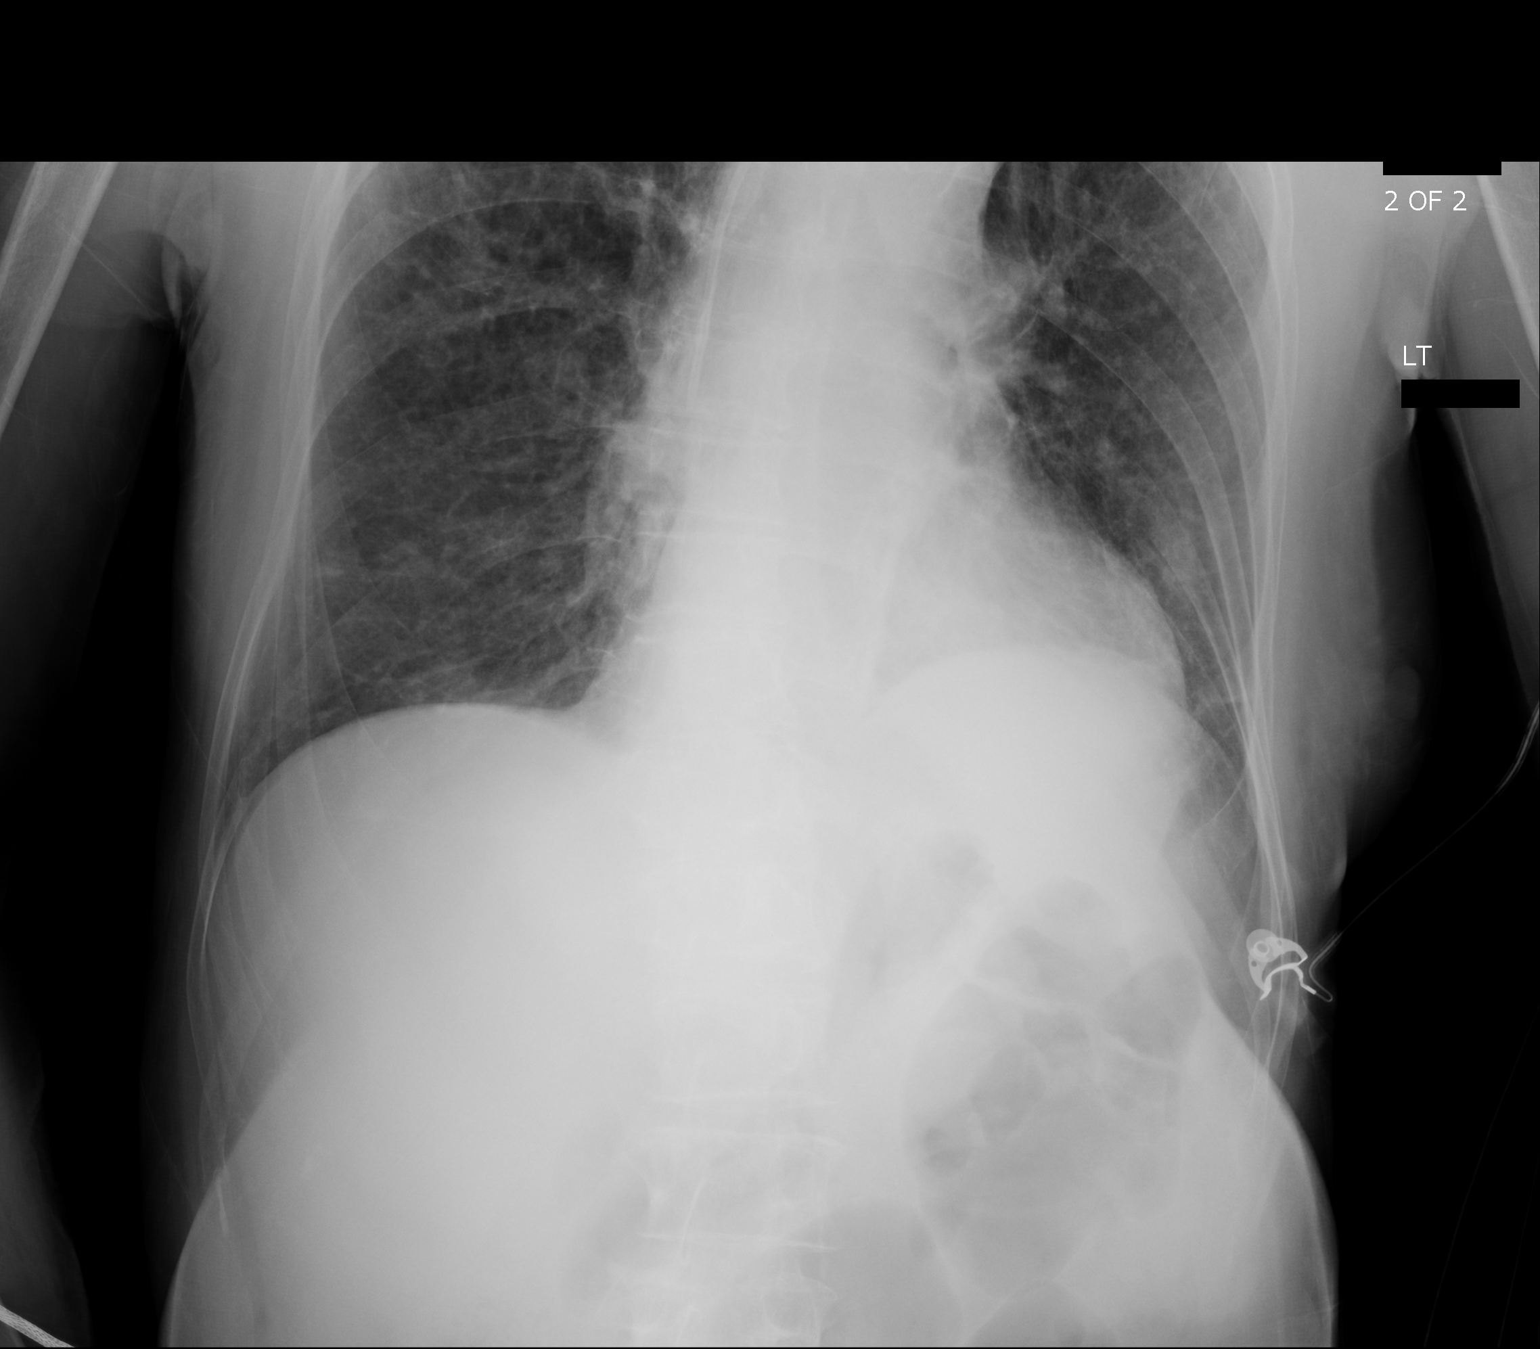

[2 of 2 positions shown; findings below may reference images not displayed]

FINDINGS: Cardiomediastinal silhouette is unremarkable. There is mild
reticular interstitial prominence bilaterally without convincing
pulmonary edema. No segmental infiltrate. No pneumothorax. There is
left IJ central line with tip in distal SVC.
IMPRESSION: Mild reticular interstitial prominence bilaterally without
convincing pulmonary edema. No segmental infiltrate. No
pneumothorax. Left IJ central line in place.

## 2017-02-22 ENCOUNTER — Ambulatory Visit (INDEPENDENT_AMBULATORY_CARE_PROVIDER_SITE_OTHER): Payer: Medicare Other | Admitting: Vascular Surgery

## 2017-03-05 ENCOUNTER — Encounter (INDEPENDENT_AMBULATORY_CARE_PROVIDER_SITE_OTHER): Payer: Self-pay | Admitting: Vascular Surgery

## 2017-03-05 ENCOUNTER — Ambulatory Visit (INDEPENDENT_AMBULATORY_CARE_PROVIDER_SITE_OTHER): Payer: Medicare Other | Admitting: Vascular Surgery

## 2017-03-05 VITALS — BP 135/77 | HR 89 | Resp 16 | Wt 115.0 lb

## 2017-03-05 DIAGNOSIS — Z89611 Acquired absence of right leg above knee: Secondary | ICD-10-CM

## 2017-03-05 DIAGNOSIS — I6523 Occlusion and stenosis of bilateral carotid arteries: Secondary | ICD-10-CM | POA: Diagnosis not present

## 2017-03-05 DIAGNOSIS — I6529 Occlusion and stenosis of unspecified carotid artery: Secondary | ICD-10-CM | POA: Insufficient documentation

## 2017-03-05 DIAGNOSIS — I700 Atherosclerosis of aorta: Secondary | ICD-10-CM | POA: Diagnosis not present

## 2017-03-05 DIAGNOSIS — I10 Essential (primary) hypertension: Secondary | ICD-10-CM | POA: Diagnosis not present

## 2017-03-05 DIAGNOSIS — Z89612 Acquired absence of left leg above knee: Secondary | ICD-10-CM | POA: Diagnosis not present

## 2017-03-05 NOTE — Progress Notes (Signed)
MRN : [id number]  [name] is a 71 y.o. (12/18/1945) female who presents with chief complaint of No chief complaint on file. Marland Kitchen.  History of Present Illness: The patient returns to the office for followup s/p bilateral AKA. There have been no interval changes in lower extremity symptoms. No development of rest pain symptoms. No new ulcers or wounds have occurred since the last visit.  There have been no significant changes to the patient's overall health care.  The patient denies amaurosis fugax or recent TIA symptoms. There are no recent neurological changes noted. The patient denies history of DVT, PE or superficial thrombophlebitis. The patient denies recent episodes of angina or shortness of breath.    No outpatient prescriptions have been marked as taking for the 03/05/17 encounter (Appointment) with Gilda CreaseSchnier, Latina CraverGregory G, MD.    Past Medical History:  Diagnosis Date  . Hypertension   . Peripheral vascular disease (HCC)   . Pneumonia 07/14/15  . Tobacco abuse   . Unilateral small kidney    a. one functional kidney    Past Surgical History:  Procedure Laterality Date  . ABOVE KNEE LEG AMPUTATION Right 01/2015  . AMPUTATION Left 07/16/2015   Procedure: AMPUTATION ABOVE KNEE;  Surgeon: Renford DillsGregory G Schnier, MD;  Location: [facility];  Service: Vascular;  Laterality: Left;  . AMPUTATION Left 07/23/2015   Procedure:   WOUND CLOSURE;  Surgeon: Renford DillsGregory G Schnier, MD;  Location: [facility];  Service: Vascular;  Laterality: Left;  . AMPUTATION Left 11/19/2015   Procedure:  ( REVISION ) AMPUTATION ABOVE KNEE ;  Surgeon: Renford DillsGregory G Schnier, MD;  Location: [facility];  Service: Vascular;  Laterality: Left;  . APPENDECTOMY  1969  . BREAST BIOPSY Right 1970   neg  . ECTOPIC PREGNANCY SURGERY Right 1969  . FOREIGN BODY REMOVAL Left 07/28/2016   Procedure: FOREIGN BODY REMOVAL ADULT ( LEG );  Surgeon: Renford DillsGregory G Schnier, MD;  Location: [facility];  Service: Vascular;  Laterality: Left;  .  PERIPHERAL VASCULAR CATHETERIZATION N/A 07/22/2015   Procedure: Abdominal Aortogram w/Lower Extremity;  Surgeon: Annice NeedyJason S Dew, MD;  Location: [facility];  Service: Cardiovascular;  Laterality: N/A;  . PERIPHERAL VASCULAR CATHETERIZATION  07/22/2015   Procedure: Lower Extremity Intervention;  Surgeon: Annice NeedyJason S Dew, MD;  Location: [facility];  Service: Cardiovascular;;  . stents left leg      Social History Social History  Substance Use Topics  . Smoking status: Light Tobacco Smoker    Packs/day: 0.25    Years: 55.00    Last attempt to quit: 01/20/2015  . Smokeless tobacco: Never Used  . Alcohol use No    Family History Family History  Problem Relation Age of Onset  . CAD Mother   . Hypertension Mother   . Stroke Mother   . CAD Father   . Hypertension Father   . Breast cancer Neg Hx     Allergies  Allergen Reactions  . Morphine And Related Diarrhea and Nausea And Vomiting     REVIEW OF SYSTEMS (Negative unless checked)  Constitutional: [] Weight loss  [] Fever  [] Chills Cardiac: [] Chest pain   [] Chest pressure   [] Palpitations   [] Shortness of breath when laying flat   [] Shortness of breath with exertion. Vascular:  [] Pain in legs with walking   [] Pain in legs at rest  [] History of DVT   [] Phlebitis   [] Swelling in legs   [] Varicose veins   [] Non-healing ulcers Pulmonary:   [] Uses  home oxygen   [] Productive cough   [] Hemoptysis   [] Wheeze  [] COPD   [] Asthma Neurologic:  [] Dizziness   [] Seizures   [] History of stroke   [] History of TIA  [] Aphasia   [] Vissual changes   [] Weakness or numbness in arm   [] Weakness or numbness in leg Musculoskeletal:   [] Joint swelling   [] Joint pain   [] Low back pain Hematologic:  [] Easy bruising  [] Easy bleeding   [] Hypercoagulable state   [] Anemic Gastrointestinal:  [] Diarrhea   [] Vomiting  [] Gastroesophageal reflux/heartburn   [] Difficulty swallowing. Genitourinary:  [] Chronic kidney disease   [] Difficult urination  [] Frequent  urination   [] Blood in urine Skin:  [] Rashes   [] Ulcers  Psychological:  [] History of anxiety   []  History of major depression.  Physical Examination  There were no vitals filed for this visit. There is no height or weight on file to calculate BMI. Gen: WD/WN, NAD Head: [place]/AT, No temporalis wasting.  Ear/Nose/Throat: Hearing grossly intact, nares w/o erythema or drainage Eyes: PER, EOMI, sclera nonicteric.  Neck: Supple, no large masses.   Pulmonary:  Good air movement, no audible wheezing bilaterally, no use of accessory muscles.  Cardiac: RRR, no JVD Vascular: bilateral AKA well healed Vessel Right Left  Radial Palpable Palpable  Ulnar Palpable Palpable  Gastrointestinal: Non-distended. No guarding/no peritoneal signs.  Musculoskeletal: M/S 5/5 throughout.  No deformity or atrophy.  Neurologic: CN 2-12 intact. Symmetrical.  Speech is fluent. Motor exam as listed above. Psychiatric: Judgment intact, Mood & affect appropriate for pt's clinical situation. Dermatologic: No rashes or ulcers noted.  No changes consistent with cellulitis. Lymph : No lichenification or skin changes of chronic lymphedema.  CBC Lab Results  Component Value Date   WBC 10.0 07/28/2016   HGB 8.0 (L) 07/28/2016   HCT 25.6 (L) 07/28/2016   MCV 73.5 (L) 07/28/2016   PLT 415 07/28/2016    BMET    Component Value Date/Time   NA 135 07/28/2016 1353   NA 131 (L) 02/13/2015 0426   K 3.5 07/28/2016 1353   K 3.0 (L) 02/13/2015 0426   CL 105 07/28/2016 1353   CL 103 02/13/2015 0426   CO2 23 07/28/2016 1353   CO2 23 02/13/2015 0426   GLUCOSE 84 07/28/2016 1353   GLUCOSE 104 (H) 02/13/2015 0426   BUN 17 07/28/2016 1353   BUN 10 02/13/2015 0426   CREATININE 0.76 07/28/2016 1353   CREATININE 0.83 02/13/2015 0426   CALCIUM 8.7 (L) 07/28/2016 1353   CALCIUM 7.3 (L) 02/13/2015 0426   GFRNONAA >60 07/28/2016 1353   GFRNONAA >60 02/13/2015 0426   GFRAA >60 07/28/2016 1353   GFRAA >60 02/13/2015 0426    CrCl cannot be calculated (Patient's most recent lab result is older than the maximum 21 days allowed.).  COAG Lab Results  Component Value Date   INR 1.36 07/28/2016   INR 2.6 07/24/2016   INR 1.07 11/19/2015    Radiology No results found.  Assessment/Plan 1. Atherosclerosis of abdominal aorta (HCC)  Recommend:  The patient has evidence of atherosclerosis of the lower extremities.  The patient does not voice lifestyle limiting changes at this point in time.  Noninvasive studies do not suggest clinically significant change.  No invasive studies, angiography or surgery at this time The patient should continue walking and begin a more formal exercise program.   The patient should continue antiplatelet therapy and aggressive treatment of the lipid abnormalities  No changes in the patient's medications at this time  2. Carotid  Stenosis Recommend:  Given the patient's asymptomatic subcritical stenosis no further invasive testing or surgery at this time.  Continue antiplatelet therapy as prescribed Continue management of CAD, HTN and Hyperlipidemia Healthy heart diet,  encouraged exercise at least 4 times per week Follow up in 12 months with duplex ultrasound and physical exam   3. Status post bilateral above knee amputation (HCC) See #1  4.  Hypertension Continue antihypertensive medications as already ordered, these medications have been reviewed and there are no changes at this time.    Levora Dredge, MD  03/05/2017 1:50 PM

## 2017-03-16 IMAGING — CR DG CHEST 1V PORT
1 series · 1 of 1 positions shown · non-contrast
Comparison: 01/08/2015

CLINICAL DATA: Patient had amputation 02/05/2015. Began having
chills yesterday, fever. History of non traumatic ischemic infarct.

EXAM:
PORTABLE CHEST - 1 VIEW

[ap]
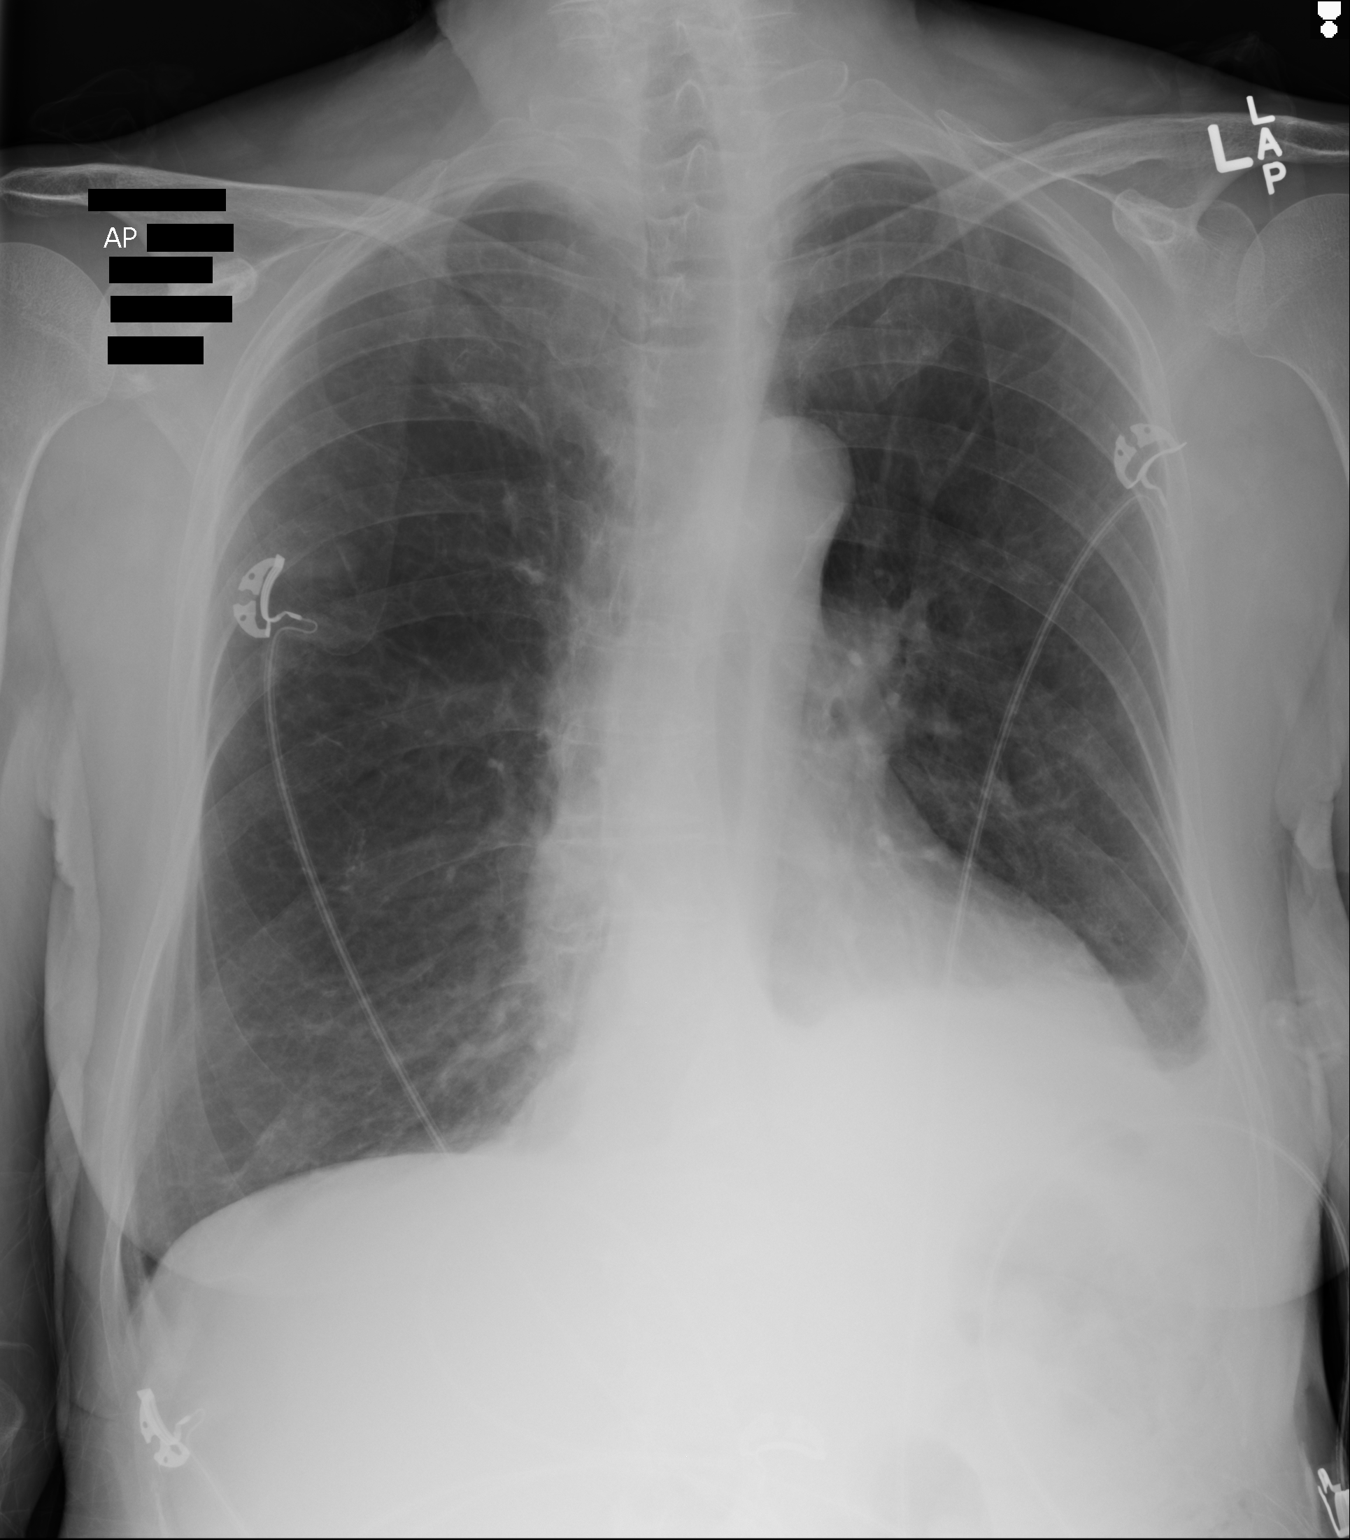

[1 of 1 positions shown; findings below may reference images not displayed]

FINDINGS: Lungs are hyperinflated. Heart is mildly enlarged. There is new
infiltrate in the left lower lobe. Associated left pleural effusion.
No pulmonary edema.
IMPRESSION: 1. New left lower lobe infiltrate and pleural effusion.
2. Hyperinflation.

## 2017-08-20 DIAGNOSIS — Z23 Encounter for immunization: Secondary | ICD-10-CM | POA: Diagnosis not present

## 2017-08-20 DIAGNOSIS — I70223 Atherosclerosis of native arteries of extremities with rest pain, bilateral legs: Secondary | ICD-10-CM | POA: Diagnosis not present

## 2017-08-20 DIAGNOSIS — M810 Age-related osteoporosis without current pathological fracture: Secondary | ICD-10-CM | POA: Diagnosis not present

## 2017-08-20 DIAGNOSIS — G894 Chronic pain syndrome: Secondary | ICD-10-CM | POA: Diagnosis not present

## 2017-11-17 ENCOUNTER — Other Ambulatory Visit: Payer: Self-pay | Admitting: Cardiovascular Disease

## 2017-11-17 ENCOUNTER — Other Ambulatory Visit: Payer: Self-pay | Admitting: Internal Medicine

## 2018-01-02 ENCOUNTER — Other Ambulatory Visit: Payer: Self-pay | Admitting: Cardiovascular Disease

## 2018-01-02 ENCOUNTER — Telehealth: Payer: Self-pay | Admitting: Cardiovascular Disease

## 2018-01-02 NOTE — Telephone Encounter (Signed)
Received refill request from [facility] for lisinopril 20mg  as prescribed by Dr. Kirke CorinArida in January 2017. Pt was to follow up PRN. Unsure what provider has been refilling. Attempted to contact the patient. No answer, no VM set up. Unable to send refills. Notified [facility].

## 2018-01-02 NOTE — Telephone Encounter (Signed)
Please advise if you would like me to refill.  Patient was last seen 11/15/2015 and was to follow up as needed.

## 2018-01-03 NOTE — Telephone Encounter (Signed)
Please advise if ok to refill. 

## 2018-01-03 NOTE — Telephone Encounter (Signed)
Patient not seen since 2017- she was to follow up with her PCP going forward. Dr. Kirke CorinArida as a PRN follow up. Refill should be addressed by PCP.   Thanks!

## 2018-01-06 ENCOUNTER — Other Ambulatory Visit: Payer: Self-pay | Admitting: Cardiovascular Disease

## 2018-01-23 ENCOUNTER — Other Ambulatory Visit: Payer: Self-pay | Admitting: Cardiovascular Disease

## 2018-01-23 NOTE — Telephone Encounter (Signed)
Since the patient has an office visit pending on 02/21/18, ok to refill #30 tablets with no refills.

## 2018-01-23 NOTE — Telephone Encounter (Signed)
Pt requesting refill on Lisinopril 20 mg tablet qd.  Pt has not been seen since 2017. Pt has upcoming appointment with Brion AlimentBerge 02/21/18. Please advise if ok to refill.

## 2018-02-18 ENCOUNTER — Encounter: Payer: Self-pay | Admitting: Nurse Practitioner

## 2018-02-18 ENCOUNTER — Ambulatory Visit (INDEPENDENT_AMBULATORY_CARE_PROVIDER_SITE_OTHER): Payer: Medicare Other | Admitting: Nurse Practitioner

## 2018-02-18 VITALS — BP 178/100 | HR 88 | Resp 16 | Ht <= 58 in | Wt 105.0 lb

## 2018-02-18 DIAGNOSIS — M79605 Pain in left leg: Secondary | ICD-10-CM

## 2018-02-18 DIAGNOSIS — G8929 Other chronic pain: Secondary | ICD-10-CM | POA: Diagnosis not present

## 2018-02-18 DIAGNOSIS — F5102 Adjustment insomnia: Secondary | ICD-10-CM | POA: Diagnosis not present

## 2018-02-18 DIAGNOSIS — I700 Atherosclerosis of aorta: Secondary | ICD-10-CM | POA: Diagnosis not present

## 2018-02-18 DIAGNOSIS — E559 Vitamin D deficiency, unspecified: Secondary | ICD-10-CM

## 2018-02-18 DIAGNOSIS — M79604 Pain in right leg: Secondary | ICD-10-CM | POA: Diagnosis not present

## 2018-02-18 DIAGNOSIS — K219 Gastro-esophageal reflux disease without esophagitis: Secondary | ICD-10-CM

## 2018-02-18 DIAGNOSIS — I10 Essential (primary) hypertension: Secondary | ICD-10-CM

## 2018-02-18 DIAGNOSIS — Z0001 Encounter for general adult medical examination with abnormal findings: Secondary | ICD-10-CM | POA: Diagnosis not present

## 2018-02-18 MED ORDER — FAMOTIDINE 20 MG PO TABS: 20.0000 mg | ORAL_TABLET | ORAL | 5 refills | Status: DC

## 2018-02-18 MED ORDER — ATORVASTATIN CALCIUM 40 MG PO TABS: 40.0000 mg | ORAL_TABLET | Freq: Every day | ORAL | 0 refills | Status: DC

## 2018-02-18 MED ORDER — LISINOPRIL 20 MG PO TABS: 20.0000 mg | ORAL_TABLET | Freq: Every day | ORAL | 0 refills | Status: DC

## 2018-02-18 MED ORDER — HYDROCODONE-ACETAMINOPHEN 5-325 MG PO TABS: 1.0000 | ORAL_TABLET | Freq: Four times a day (QID) | ORAL | 0 refills | Status: DC | PRN

## 2018-02-18 MED ORDER — ALPRAZOLAM 0.25 MG PO TABS: 0.2500 mg | ORAL_TABLET | Freq: Every evening | ORAL | 2 refills | Status: DC | PRN

## 2018-02-18 NOTE — Progress Notes (Signed)
[facility] [address] [place], [place] [address]  Internal MEDICINE  Office Visit Note  Patient Name: [name]  [id number]  [id number]  Date of Service: 03/11/2018   Pt is here for routine health maintenance examination  Chief Complaint  Patient presents with  . Hypertension     Patient's blood pressure elevated today. Has been out of her BP medication. Has been out for a couple of weeks. [facility] did not notify her that her medication was ready. She does need to have refills for all routine medications.     Current Medication: Outpatient Encounter Medications as of 02/18/2018  Medication Sig Note  . atorvastatin (LIPITOR) 40 MG tablet Take 1 tablet (40 mg total) by mouth daily at 6 PM.   . famotidine (PEPCID) 20 MG tablet Take 1 tablet (20 mg total) by mouth every morning.   Marland Kitchen HYDROcodone-acetaminophen (NORCO/VICODIN) 5-325 MG tablet Take 1 tablet by mouth every 6 (six) hours as needed for moderate pain.   . traMADol (ULTRAM) 50 MG tablet Take 50 mg by mouth as needed.   . [DISCONTINUED] atorvastatin (LIPITOR) 40 MG tablet Take 1 tablet (40 mg total) by mouth daily at 6 PM.   . [DISCONTINUED] famotidine (PEPCID) 20 MG tablet Take 20 mg by mouth every morning.    . [DISCONTINUED] HYDROcodone-acetaminophen (NORCO/VICODIN) 5-325 MG tablet TAKE 1 TABLET BY MOUTH 3 TIMES A DAY AS NEEDED FOR PAIN IN AMPUTATED LEG   . [DISCONTINUED] lisinopril (PRINIVIL,ZESTRIL) 20 MG tablet  07/27/2016: Received from: External Pharmacy  . ALPRAZolam (XANAX) 0.25 MG tablet Take 1 tablet (0.25 mg total) by mouth at bedtime as needed for anxiety.   Marland Kitchen aspirin 81 MG tablet Take 81 mg by mouth every morning.    Marland Kitchen ELIQUIS 2.5 MG TABS tablet TAKE 1 TABLET(S) BY MOUTH TWICE A DAY FOR DVT PROPHYLAZIS   . gabapentin (NEURONTIN) 300 MG capsule TAKE ONE CAPSULE BY MOUTH 3 TIMES A DAY (Patient not taking: Reported on 02/18/2018)   . lisinopril (PRINIVIL,ZESTRIL) 20 MG tablet Take 1 tablet (20 mg  total) by mouth daily.   . magnesium hydroxide (MILK OF MAGNESIA) 400 MG/5ML suspension Take 30 mLs by mouth daily as needed for mild constipation.   . [DISCONTINUED] lisinopril (PRINIVIL,ZESTRIL) 20 MG tablet TAKE 1 TABLET (20 MG TOTAL) BY MOUTH DAILY.   . [DISCONTINUED] nortriptyline (PAMELOR) 25 MG capsule Take 25 mg by mouth at bedtime.   . [DISCONTINUED] oxyCODONE-acetaminophen (ROXICET) 5-325 MG tablet Take 1-2 tablets by mouth every 6 (six) hours as needed for moderate pain or severe pain. (Patient not taking: Reported on 03/05/2017)   . [DISCONTINUED] tiZANidine (ZANAFLEX) 2 MG tablet TAKE 1 TABLET BY MOUTH TWICE A DAY FOR BACK PAIN   . [DISCONTINUED] warfarin (COUMADIN) 2 MG tablet Take 2 mg by mouth as directed. 3 days a week Monday, Wednesday and Friday.   . [DISCONTINUED] warfarin (COUMADIN) 5 MG tablet Take 2.5-5 mg by mouth daily. Takes 2 mg on Monday, Wednesday and Friday. All other days take     No facility-administered encounter medications on file as of 02/18/2018.     Surgical History: Past Surgical History:  Procedure Laterality Date  . ABOVE KNEE LEG AMPUTATION Right 01/2015  . AMPUTATION Left 07/16/2015   Procedure: AMPUTATION ABOVE KNEE;  Surgeon: Renford Dills, MD;  Location: [facility]S;  Service: Vascular;  Laterality: Left;  . AMPUTATION Left 07/23/2015   Procedure:   WOUND CLOSURE;  Surgeon: Renford Dills, MD;  Location:  [facility];  Service: Vascular;  Laterality: Left;  . AMPUTATION Left 11/19/2015   Procedure:  ( REVISION ) AMPUTATION ABOVE KNEE ;  Surgeon: Renford Dills, MD;  Location: [facility];  Service: Vascular;  Laterality: Left;  . APPENDECTOMY  1969  . BREAST BIOPSY Right 1970   neg  . ECTOPIC PREGNANCY SURGERY Right 1969  . FOREIGN BODY REMOVAL Left 07/28/2016   Procedure: FOREIGN BODY REMOVAL ADULT ( LEG );  Surgeon: Renford Dills, MD;  Location: [facility] OR[facility];  Service: Vascular;  Laterality: Left;  . PERIPHERAL VASCULAR CATHETERIZATION N/A  07/22/2015   Procedure: Abdominal Aortogram w/Lower Extremity;  Surgeon: Annice Needy, MD;  Location: [facility]AS[facility];  Service: Cardiovascular;  Laterality: N/A;  . PERIPHERAL VASCULAR CATHETERIZATION  07/22/2015   Procedure: Lower Extremity Intervention;  Surgeon: Annice Needy, MD;  Location: [facility];  Service: Cardiovascular;;  . stents left leg      Medical History: Past Medical History:  Diagnosis Date  . Hypertension   . Peripheral vascular disease (HCC)   . Pneumonia 07/14/15  . Tobacco abuse   . 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 CAD Mother   . Hypertension Mother   . Stroke Mother   . CAD Father   . Hypertension Father   . Breast cancer Neg Hx       Review of Systems  Constitutional: Negative for activity change, chills, diaphoresis, fatigue and unexpected weight change.  HENT: Negative for ear pain, postnasal drip and sinus pressure.   Eyes: Negative.  Negative for photophobia, discharge, redness, itching and visual disturbance.  Respiratory: Negative for cough, chest tightness, shortness of breath and wheezing.   Cardiovascular: Negative for chest pain, palpitations and leg swelling.       Blood pressure elevated today  Gastrointestinal: Negative for abdominal pain, constipation, diarrhea, nausea and vomiting.  Endocrine: Negative for cold intolerance, heat intolerance, polydipsia, polyphagia and polyuria.  Genitourinary: Negative for dysuria, flank pain, hematuria and urgency.  Musculoskeletal: Negative for arthralgias, back pain, gait problem and neck pain.       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   Skin: Negative for color change and rash.  Allergic/Immunologic: Negative for environmental allergies and food allergies.  Neurological: Positive for weakness. Negative for dizziness and headaches.  Hematological:  Negative for adenopathy. Does not bruise/bleed easily.  Psychiatric/Behavioral: Negative for agitation, behavioral problems (depression) and hallucinations. The patient is not nervous/anxious.     Today's Vitals   02/18/18 1048  BP: (!) 178/100  Pulse: 88  Resp: 16  SpO2: 97%  Weight: 105 lb (47.6 kg)  Height:  (1.245 m)     Physical Exam  Constitutional: She is oriented to person, place, and time. She appears well-developed and well-nourished. No distress.  HENT:  Head: Normocephalic and atraumatic.  Nose: Nose normal.  Mouth/Throat: Oropharynx is clear and moist. No oropharyngeal exudate.  Eyes: Pupils are equal, round, and reactive to light. Conjunctivae and EOM are normal.  Neck: Normal range of motion. Neck supple. No JVD present. Carotid bruit is not present. No tracheal deviation present. No thyromegaly present.  Cardiovascular: Normal rate, regular rhythm and normal heart sounds. Exam reveals no gallop and no friction rub.  No murmur heard. Pulmonary/Chest: Effort normal and breath sounds normal. No respiratory distress. She has no wheezes. She  has no rales. She exhibits no tenderness.  Abdominal: Soft. Bowel sounds are normal. There is no tenderness.  Musculoskeletal: Normal range of motion.  Tenderness at amputation site of both legs. No redness, swelling, or evidence of infection or abnormalities. She currently uses a manual wheelchair.  Lymphadenopathy:    She has no cervical adenopathy.  Neurological: She is alert and oriented to person, place, and time. No cranial nerve deficit.  Skin: Skin is warm and dry. Capillary refill takes 2 to 3 seconds. She is not diaphoretic.  Psychiatric: She has a normal mood and affect. Her behavior is normal. Judgment and thought content normal.  Nursing note and vitals reviewed.    LABS: No results found for this or any previous visit (from the past 2160 hour(s)).  Assessment/Plan: 1. Encounter for general adult medical  examination with abnormal findings Annual health maintenance exam with routine fasting labs ordered.  - CBC with Differential/Platelet - Comprehensive metabolic panel - T4, free - TSH - Lipid panel  2. Essential hypertension Elevated pressure today as she has been out of medication for a few weeks. Refill lisinopril and take daily.  - lisinopril (PRINIVIL,ZESTRIL) 20 MG tablet; Take 1 tablet (20 mg total) by mouth daily.  Dispense: 30 tablet; Refill: 0 - CBC with Differential/Platelet - Comprehensive metabolic panel - T4, free  3. Atherosclerosis of abdominal aorta (HCC) Does not want to take eliquis at this time. Reviewed risk factors of stopping this medication. Will have her continue aspirin and atorvastatin every day. Revaluate at next visit.  - atorvastatin (LIPITOR) 40 MG tablet; Take 1 tablet (40 mg total) by mouth daily at 6 PM.  Dispense: 30 tablet; Refill: 0 - Lipid panel  4. Chronic pain of both lower extremities May take hysrocodone/APAP 5/325mg  tablets up to every 6 hours if needed for pain. Prescription for #30 tablets sent to pharmacy. This is not a new prescription and is used to treat chronic pain in bilateral amputation sites. Will generally take 1 tablet daily.  - HYDROcodone-acetaminophen (NORCO/VICODIN) 5-325 MG tablet; Take 1 tablet by mouth every 6 (six) hours as needed for moderate pain.  Dispense: 65 tablet; Refill: 0  5. Gastroesophageal reflux disease without esophagitis - famotidine (PEPCID) 20 MG tablet; Take 1 tablet (20 mg total) by mouth every morning.  Dispense: 30 tablet; Refill: 5  6. Adjustment insomnia - ALPRAZolam (XANAX) 0.25 MG tablet; Take 1 tablet (0.25 mg total) by mouth at bedtime as needed for anxiety.  Dispense: 30 tablet; Refill: 2 - TSH  7. Vitamin D deficiency - Vitamin D 1,25 dihydroxy  General Counseling: [name] verbalizes understanding of the findings of todays visit and agrees with plan of treatment. I have discussed any further  diagnostic evaluation that may be needed or ordered today. We also reviewed her medications today. she has been encouraged to call the office with any questions or concerns that should arise related to todays visit.  Reviewed risks and possible side effects associated with taking opiates and benzodiazepines. Combination of these could cause dizziness and drowsiness. Advised him not to drive or operate machinery when taking these medications, as he could put his life and the lives of others at risk. He voiced understanding.   Hypertension Counseling:   The following hypertensive lifestyle modification were recommended and discussed:  1. Limiting alcohol intake to less than 1 oz/day of ethanol:(24 oz of beer or 8 oz of wine or 2 oz of 100-proof whiskey). 2. Take baby ASA 81 mg daily. 3. Importance  of regular aerobic exercise and losing weight. 4. Reduce dietary saturated fat and cholesterol intake for overall cardiovascular health. 5. Maintaining adequate dietary potassium, calcium, and magnesium intake. 6. Regular monitoring of the blood pressure. 7. Reduce sodium intake to less than 100 mmol/day (less than 2.3 gm of sodium or less than 6 gm of sodium choride)   This patient was seen by Vincent Gros, FNP- C in Collaboration with Dr Lyndon Code as a part of collaborative care agreement    Orders Placed This Encounter  Procedures  . CBC with Differential/Platelet  . Comprehensive metabolic panel  . T4, free  . TSH  . Lipid panel  . Vitamin D 1,25 dihydroxy    Meds ordered this encounter  Medications  . atorvastatin (LIPITOR) 40 MG tablet    Sig: Take 1 tablet (40 mg total) by mouth daily at 6 PM.    Dispense:  30 tablet    Refill:  0    Order Specific Question:   Supervising Provider    Answer:   Lyndon Code [[id number]]  . famotidine (PEPCID) 20 MG tablet    Sig: Take 1 tablet (20 mg total) by mouth every morning.    Dispense:  30 tablet    Refill:  5    Order Specific Question:    Supervising Provider    Answer:   Lyndon Code [[id number]]  . HYDROcodone-acetaminophen (NORCO/VICODIN) 5-325 MG tablet    Sig: Take 1 tablet by mouth every 6 (six) hours as needed for moderate pain.    Dispense:  65 tablet    Refill:  0    Prescription is not for post op pain and is not new prescription. This is chronic/ongoing pain for which she takes this medication only as needed.    Order Specific Question:   Supervising Provider    Answer:   Lyndon Code [[id number]]  . lisinopril (PRINIVIL,ZESTRIL) 20 MG tablet    Sig: Take 1 tablet (20 mg total) by mouth daily.    Dispense:  30 tablet    Refill:  0    Order Specific Question:   Supervising Provider    Answer:   Lyndon Code [[id number]]  . ALPRAZolam (XANAX) 0.25 MG tablet    Sig: Take 1 tablet (0.25 mg total) by mouth at bedtime as needed for anxiety.    Dispense:  30 tablet    Refill:  2    Order Specific Question:   Supervising Provider    Answer:   Lyndon Code [[id number]]    Time spent: 67 Minutes      Lyndon Code, MD  Internal Medicine

## 2018-02-21 ENCOUNTER — Ambulatory Visit: Payer: Medicare Other | Admitting: Nurse Practitioner

## 2018-03-07 ENCOUNTER — Encounter (INDEPENDENT_AMBULATORY_CARE_PROVIDER_SITE_OTHER): Payer: Medicare Other

## 2018-03-07 ENCOUNTER — Ambulatory Visit (INDEPENDENT_AMBULATORY_CARE_PROVIDER_SITE_OTHER): Payer: Medicare Other | Admitting: Vascular Surgery

## 2018-03-11 DIAGNOSIS — G8929 Other chronic pain: Secondary | ICD-10-CM | POA: Insufficient documentation

## 2018-03-11 DIAGNOSIS — M79604 Pain in right leg: Secondary | ICD-10-CM

## 2018-03-11 DIAGNOSIS — K219 Gastro-esophageal reflux disease without esophagitis: Secondary | ICD-10-CM | POA: Insufficient documentation

## 2018-03-11 DIAGNOSIS — E559 Vitamin D deficiency, unspecified: Secondary | ICD-10-CM | POA: Insufficient documentation

## 2018-03-11 DIAGNOSIS — M79605 Pain in left leg: Secondary | ICD-10-CM

## 2018-03-11 DIAGNOSIS — F5102 Adjustment insomnia: Secondary | ICD-10-CM | POA: Insufficient documentation

## 2018-03-13 ENCOUNTER — Other Ambulatory Visit: Payer: Self-pay | Admitting: Nurse Practitioner

## 2018-03-13 DIAGNOSIS — I10 Essential (primary) hypertension: Secondary | ICD-10-CM

## 2018-03-13 MED ORDER — LISINOPRIL 20 MG PO TABS: 20.0000 mg | ORAL_TABLET | Freq: Every day | ORAL | 5 refills | Status: DC

## 2018-05-13 ENCOUNTER — Ambulatory Visit (INDEPENDENT_AMBULATORY_CARE_PROVIDER_SITE_OTHER): Payer: Medicare Other | Admitting: Vascular Surgery

## 2018-05-13 ENCOUNTER — Encounter (INDEPENDENT_AMBULATORY_CARE_PROVIDER_SITE_OTHER): Payer: Medicare Other

## 2018-05-17 ENCOUNTER — Other Ambulatory Visit: Payer: Self-pay | Admitting: Internal Medicine

## 2018-05-17 DIAGNOSIS — I700 Atherosclerosis of aorta: Secondary | ICD-10-CM

## 2018-05-20 ENCOUNTER — Other Ambulatory Visit: Payer: Self-pay | Admitting: Nurse Practitioner

## 2018-05-20 DIAGNOSIS — F5102 Adjustment insomnia: Secondary | ICD-10-CM

## 2018-05-20 MED ORDER — ALPRAZOLAM 0.25 MG PO TABS: 0.2500 mg | ORAL_TABLET | Freq: Every evening | ORAL | 1 refills | Status: DC | PRN

## 2018-05-27 ENCOUNTER — Other Ambulatory Visit
Admission: RE | Admit: 2018-05-27 | Discharge: 2018-05-27 | Disposition: A | Discharge status: Home / Self Care | Payer: Medicare Other | Source: Ambulatory Visit | Attending: Nurse Practitioner | Admitting: Nurse Practitioner

## 2018-05-27 DIAGNOSIS — Z0000 Encounter for general adult medical examination without abnormal findings: Secondary | ICD-10-CM | POA: Diagnosis not present

## 2018-05-27 LAB — CBC WITH DIFFERENTIAL/PLATELET
Basophils Absolute: 0 10*3/uL (ref 0–0.1)
Basophils Relative: 0 %
Eosinophils Absolute: 0.2 10*3/uL (ref 0–0.7)
Eosinophils Relative: 2 %
HCT: 38.9 % (ref 35.0–47.0)
Hemoglobin: 13.2 g/dL (ref 12.0–16.0)
Lymphocytes Relative: 22 %
Lymphs Abs: 1.8 10*3/uL (ref 1.0–3.6)
MCH: 29.9 pg (ref 26.0–34.0)
MCHC: 33.9 g/dL (ref 32.0–36.0)
MCV: 88.2 fL (ref 80.0–100.0)
Monocytes Absolute: 0.4 10*3/uL (ref 0.2–0.9)
Monocytes Relative: 5 %
Neutro Abs: 5.7 10*3/uL (ref 1.4–6.5)
Neutrophils Relative %: 71 %
Platelets: 268 10*3/uL (ref 150–440)
RBC: 4.41 MIL/uL (ref 3.80–5.20)
RDW: 16.7 % — ABNORMAL HIGH (ref 11.5–14.5)
WBC: 8.1 10*3/uL (ref 3.6–11.0)

## 2018-05-27 LAB — LIPID PANEL
Cholesterol: 116 mg/dL (ref 0–200)
HDL: 41 mg/dL (ref >40)
LDL Cholesterol: 64 mg/dL (ref 0–99)
Total CHOL/HDL Ratio: 2.8 RATIO
Triglycerides: 53 mg/dL (ref <150)
VLDL: 11 mg/dL (ref 0–40)

## 2018-05-27 LAB — COMPREHENSIVE METABOLIC PANEL
ALT: 9 U/L (ref 0–44)
AST: 16 U/L (ref 15–41)
Albumin: 3.8 g/dL (ref 3.5–5.0)
Alkaline Phosphatase: 115 U/L (ref 38–126)
Anion gap: 6 (ref 5–15)
BUN: 14 mg/dL (ref 8–23)
CO2: 24 mmol/L (ref 22–32)
Calcium: 9 mg/dL (ref 8.9–10.3)
Chloride: 108 mmol/L (ref 98–111)
Creatinine, Ser: 0.65 mg/dL (ref 0.44–1.00)
GFR calc Af Amer: >60 mL/min (ref >60)
GFR calc non Af Amer: >60 mL/min (ref >60)
Glucose, Bld: 88 mg/dL (ref 70–99)
Potassium: 4 mmol/L (ref 3.5–5.1)
Sodium: 138 mmol/L (ref 135–145)
Total Bilirubin: 0.6 mg/dL (ref 0.3–1.2)
Total Protein: 7.1 g/dL (ref 6.5–8.1)

## 2018-05-27 LAB — TSH: TSH: 1.727 u[IU]/mL (ref 0.350–4.500)

## 2018-05-27 LAB — T4, FREE: Free T4: 0.9 ng/dL (ref 0.82–1.77)

## 2018-05-28 LAB — VITAMIN D 25 HYDROXY (VIT D DEFICIENCY, FRACTURES): Vit D, 25-Hydroxy: 6.7 ng/mL — ABNORMAL LOW (ref 30.0–100.0)

## 2018-05-29 ENCOUNTER — Ambulatory Visit: Payer: Medicare Other | Admitting: Nurse Practitioner

## 2018-05-29 ENCOUNTER — Encounter: Payer: Self-pay | Admitting: Nurse Practitioner

## 2018-05-29 VITALS — BP 137/76 | HR 98 | Resp 16 | Ht <= 58 in | Wt 105.0 lb

## 2018-05-29 DIAGNOSIS — E559 Vitamin D deficiency, unspecified: Secondary | ICD-10-CM

## 2018-05-29 DIAGNOSIS — I10 Essential (primary) hypertension: Secondary | ICD-10-CM | POA: Diagnosis not present

## 2018-05-29 DIAGNOSIS — F5102 Adjustment insomnia: Secondary | ICD-10-CM

## 2018-05-29 DIAGNOSIS — Z23 Encounter for immunization: Secondary | ICD-10-CM

## 2018-05-29 MED ORDER — ERGOCALCIFEROL 1.25 MG (50000 UT) PO CAPS
50000.0000 [IU] | ORAL_CAPSULE | ORAL | 5 refills | Status: DC
Start: 2018-05-29 — End: 2020-03-03

## 2018-05-29 MED ORDER — ALPRAZOLAM 0.25 MG PO TABS: 0.2500 mg | ORAL_TABLET | Freq: Every evening | ORAL | 1 refills | Status: DC | PRN

## 2018-05-29 NOTE — Progress Notes (Deleted)
[facility] [address] [place], [place] [id number]  Internal MEDICINE  Office Visit Note  Patient Name: [name]  [id number]  [id number]  Date of Service: 05/29/2018  Chief Complaint  Patient presents with  . Hypertension    3 month follow up  . Sleep Apnea    added low dose of alprazolam  . Quality Metric Gaps    pneumovax    HPI     Current Medication: Outpatient Encounter Medications as of 05/29/2018  Medication Sig  . ALPRAZolam (XANAX) 0.25 MG tablet Take 1 tablet (0.25 mg total) by mouth at bedtime as needed for anxiety.  Marland Kitchen. aspirin 81 MG tablet Take 81 mg by mouth every morning.   Marland Kitchen. atorvastatin (LIPITOR) 40 MG tablet TAKE 1 TABLET BY MOUTH AT BEDTIME  . ELIQUIS 2.5 MG TABS tablet TAKE 1 TABLET(S) BY MOUTH TWICE A DAY FOR DVT PROPHYLAZIS  . famotidine (PEPCID) 20 MG tablet Take 1 tablet (20 mg total) by mouth every morning.  . gabapentin (NEURONTIN) 300 MG capsule TAKE ONE CAPSULE BY MOUTH 3 TIMES A DAY  . HYDROcodone-acetaminophen (NORCO/VICODIN) 5-325 MG tablet Take 1 tablet by mouth every 6 (six) hours as needed for moderate pain.  Marland Kitchen. lisinopril (PRINIVIL,ZESTRIL) 20 MG tablet Take 1 tablet (20 mg total) by mouth daily.  . magnesium hydroxide (MILK OF MAGNESIA) 400 MG/5ML suspension Take 30 mLs by mouth daily as needed for mild constipation.  . traMADol (ULTRAM) 50 MG tablet Take 50 mg by mouth as needed.   No facility-administered encounter medications on file as of 05/29/2018.     Surgical History: Past Surgical History:  Procedure Laterality Date  . ABOVE KNEE LEG AMPUTATION Right 01/2015  . AMPUTATION Left 07/16/2015   Procedure: AMPUTATION ABOVE KNEE;  Surgeon: Renford DillsGregory G Schnier, MD;  Location: [facility];  Service: Vascular;  Laterality: Left;  . AMPUTATION Left 07/23/2015   Procedure:   WOUND CLOSURE;  Surgeon: Renford DillsGregory G Schnier, MD;  Location: [facility];  Service: Vascular;  Laterality: Left;  . AMPUTATION Left 11/19/2015   Procedure:  (  REVISION ) AMPUTATION ABOVE KNEE ;  Surgeon: Renford DillsGregory G Schnier, MD;  Location: [facility];  Service: Vascular;  Laterality: Left;  . APPENDECTOMY  1969  . BREAST BIOPSY Right 1970   neg  . ECTOPIC PREGNANCY SURGERY Right 1969  . FOREIGN BODY REMOVAL Left 07/28/2016   Procedure: FOREIGN BODY REMOVAL ADULT ( LEG );  Surgeon: Renford DillsGregory G Schnier, MD;  Location: [facility];  Service: Vascular;  Laterality: Left;  . PERIPHERAL VASCULAR CATHETERIZATION N/A 07/22/2015   Procedure: Abdominal Aortogram w/Lower Extremity;  Surgeon: Annice NeedyJason S Dew, MD;  Location: [facility];  Service: Cardiovascular;  Laterality: N/A;  . PERIPHERAL VASCULAR CATHETERIZATION  07/22/2015   Procedure: Lower Extremity Intervention;  Surgeon: Annice NeedyJason S Dew, MD;  Location: [facility];  Service: Cardiovascular;;  . stents left leg      Medical History: Past Medical History:  Diagnosis Date  . Hypertension   . Peripheral vascular disease (HCC)   . Pneumonia 07/14/15  . Tobacco abuse   . 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 CAD Mother   . Hypertension Mother   . Stroke Mother   . CAD Father   . Hypertension Father   . Breast cancer Neg Hx     Social History   Socioeconomic History  .  Marital status: Married    Spouse name: Not on file  . Number of children: Not on file  . Years of education: Not on file  . Highest education level: Not on file  Occupational History  . Not on file  Social Needs  . Financial resource strain: Not on file  . Food insecurity:    Worry: Not on file    Inability: Not on file  . Transportation needs:    Medical: Not on file    Non-medical: Not on file  Tobacco Use  . Smoking status: Light Tobacco Smoker    Packs/day: 0.25    Years: 55.00    Pack years: 13.75    Last attempt to quit: 01/20/2015    Years since quitting: 3.3  . Smokeless tobacco: Never Used  Substance and Sexual  Activity  . Alcohol use: No  . Drug use: No  . Sexual activity: Not on file  Lifestyle  . Physical activity:    Days per week: Not on file    Minutes per session: Not on file  . Stress: Not on file  Relationships  . Social connections:    Talks on phone: Not on file    Gets together: Not on file    Attends religious service: Not on file    Active member of club or organization: Not on file    Attends meetings of clubs or organizations: Not on file    Relationship status: Not on file  . Intimate partner violence:    Fear of current or ex partner: Not on file    Emotionally abused: Not on file    Physically abused: Not on file    Forced sexual activity: Not on file  Other Topics Concern  . Not on file  Social History Narrative  . Not on file      Review of Systems  Vital Signs: BP 137/76 (BP Location: Left Arm, Patient Position: Sitting, Cuff Size: Normal)   Pulse 98   Resp 16   Ht 4\' 1"  (1.245 m)   Wt 105 lb (47.6 kg)   SpO2 95%   BMI 30.75 kg/m    Physical Exam     Assessment/Plan:   General Counseling: Whitney Hashimotoatricia verbalizes understanding of the findings of todays visit and agrees with plan of treatment. I have discussed any further diagnostic evaluation that may be needed or ordered today. We also reviewed her medications today. she has been encouraged to call the office with any questions or concerns that should arise related to todays visit.    No orders of the defined types were placed in this encounter.   No orders of the defined types were placed in this encounter.   Time spent:*** Minutes      Dr Lyndon CodeFozia M Khan Internal medicine

## 2018-05-30 ENCOUNTER — Ambulatory Visit: Payer: Self-pay | Admitting: Nurse Practitioner

## 2018-06-12 NOTE — Progress Notes (Signed)
[facility] [address] [place], [place] [id number]  Internal MEDICINE  Office Visit Note  Patient Name: [name]  [id number]  [id number]  Date of Service: 06/12/2018  Chief Complaint  Patient presents with  . Hypertension    3 month follow up  . Anxiety    added low dose of alprazolam  . Quality Metric Gaps    pneumovax    Added low dose alprazolam 0.25mg , which may be taken at bedtime to help her sleep. She states that she has done very well with this. She is able to fall asleep and stay asleep for a few hours. She does not feel groggy the next day. She would like to continue this if possible.  She is due for pneumonia vaccine.   Hypertension  This is a chronic problem. The current episode started more than 1 year ago. The problem is unchanged. The problem is controlled. Associated symptoms include headaches. Pertinent negatives include no chest pain, neck pain, palpitations or shortness of breath. There are no associated agents to hypertension. Risk factors for coronary artery disease include smoking/tobacco exposure, sedentary lifestyle, post-menopausal state, dyslipidemia and family history. Past treatments include ACE inhibitors. The current treatment provides moderate improvement. Compliance problems include exercise.  Hypertensive end-organ damage includes CAD/MI and PVD.        Current Medication: Outpatient Encounter Medications as of 05/29/2018  Medication Sig  . ALPRAZolam (XANAX) 0.25 MG tablet Take 1 tablet (0.25 mg total) by mouth at bedtime as needed for anxiety.  Marland Kitchen. aspirin 81 MG tablet Take 81 mg by mouth every morning.   Marland Kitchen. atorvastatin (LIPITOR) 40 MG tablet TAKE 1 TABLET BY MOUTH AT BEDTIME  . ELIQUIS 2.5 MG TABS tablet TAKE 1 TABLET(S) BY MOUTH TWICE A DAY FOR DVT PROPHYLAZIS  . famotidine (PEPCID) 20 MG tablet Take 1 tablet (20 mg total) by mouth every morning.  . gabapentin (NEURONTIN) 300 MG capsule TAKE ONE CAPSULE BY MOUTH 3 TIMES A  DAY  . HYDROcodone-acetaminophen (NORCO/VICODIN) 5-325 MG tablet Take 1 tablet by mouth every 6 (six) hours as needed for moderate pain.  Marland Kitchen. lisinopril (PRINIVIL,ZESTRIL) 20 MG tablet Take 1 tablet (20 mg total) by mouth daily.  . magnesium hydroxide (MILK OF MAGNESIA) 400 MG/5ML suspension Take 30 mLs by mouth daily as needed for mild constipation.  . traMADol (ULTRAM) 50 MG tablet Take 50 mg by mouth as needed.  . [DISCONTINUED] ALPRAZolam (XANAX) 0.25 MG tablet Take 1 tablet (0.25 mg total) by mouth at bedtime as needed for anxiety.  . [DISCONTINUED] ALPRAZolam (XANAX) 0.25 MG tablet Take 1 tablet (0.25 mg total) by mouth at bedtime as needed for anxiety.  . ergocalciferol (DRISDOL) 50000 units capsule Take 1 capsule (50,000 Units total) by mouth once a week.   No facility-administered encounter medications on file as of 05/29/2018.     Surgical History: Past Surgical History:  Procedure Laterality Date  . ABOVE KNEE LEG AMPUTATION Right 01/2015  . AMPUTATION Left 07/16/2015   Procedure: AMPUTATION ABOVE KNEE;  Surgeon: Renford DillsGregory G Schnier, MD;  Location: [facility];  Service: Vascular;  Laterality: Left;  . AMPUTATION Left 07/23/2015   Procedure:   WOUND CLOSURE;  Surgeon: Renford DillsGregory G Schnier, MD;  Location: [facility];  Service: Vascular;  Laterality: Left;  . AMPUTATION Left 11/19/2015   Procedure:  ( REVISION ) AMPUTATION ABOVE KNEE ;  Surgeon: Renford DillsGregory G Schnier, MD;  Location: [facility];  Service: Vascular;  Laterality: Left;  . APPENDECTOMY  1969  .  BREAST BIOPSY Right 1970   neg  . ECTOPIC PREGNANCY SURGERY Right 1969  . FOREIGN BODY REMOVAL Left 07/28/2016   Procedure: FOREIGN BODY REMOVAL ADULT ( LEG );  Surgeon: Renford Dills, MD;  Location: [facility];  Service: Vascular;  Laterality: Left;  . PERIPHERAL VASCULAR CATHETERIZATION N/A 07/22/2015   Procedure: Abdominal Aortogram w/Lower Extremity;  Surgeon: Annice Needy, MD;  Location: [facility];  Service: Cardiovascular;   Laterality: N/A;  . PERIPHERAL VASCULAR CATHETERIZATION  07/22/2015   Procedure: Lower Extremity Intervention;  Surgeon: Annice Needy, MD;  Location: [facility];  Service: Cardiovascular;;  . stents left leg      Medical History: Past Medical History:  Diagnosis Date  . Hypertension   . Peripheral vascular disease (HCC)   . Pneumonia 07/14/15  . Tobacco abuse   . 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 CAD Mother   . Hypertension Mother   . Stroke Mother   . CAD Father   . Hypertension Father   . Breast cancer Neg Hx     Social History   Socioeconomic History  . Marital status: Married    Spouse name: Not on file  . Number of children: Not on file  . Years of education: Not on file  . Highest education level: Not on file  Occupational History  . Not on file  Social Needs  . Financial resource strain: Not on file  . Food insecurity:    Worry: Not on file    Inability: Not on file  . Transportation needs:    Medical: Not on file    Non-medical: Not on file  Tobacco Use  . Smoking status: Light Tobacco Smoker    Packs/day: 0.25    Years: 55.00    Pack years: 13.75    Last attempt to quit: 01/20/2015    Years since quitting: 3.3  . Smokeless tobacco: Never Used  Substance and Sexual Activity  . Alcohol use: No  . Drug use: No  . Sexual activity: Not on file  Lifestyle  . Physical activity:    Days per week: Not on file    Minutes per session: Not on file  . Stress: Not on file  Relationships  . Social connections:    Talks on phone: Not on file    Gets together: Not on file    Attends religious service: Not on file    Active member of club or organization: Not on file    Attends meetings of clubs or organizations: Not on file    Relationship status: Not on file  . Intimate partner violence:    Fear of current or ex partner: Not on file    Emotionally abused: Not on  file    Physically abused: Not on file    Forced sexual activity: Not on file  Other Topics Concern  . Not on file  Social History Narrative  . Not on file      Review of Systems  Constitutional: Negative for activity change, chills, diaphoresis, fatigue and unexpected weight change.  HENT: Negative for ear pain, postnasal drip and sinus pressure.   Eyes: Negative.  Negative for photophobia, discharge, redness, itching and visual disturbance.  Respiratory: Negative for cough, chest tightness, shortness of breath and wheezing.   Cardiovascular: Negative for chest pain, palpitations and leg swelling.  Good blood pressure control   Gastrointestinal: Negative for abdominal pain, constipation, diarrhea, nausea and vomiting.  Endocrine: Negative for cold intolerance, heat intolerance, polydipsia, polyphagia and polyuria.  Genitourinary: Negative.  Negative for dysuria, flank pain, hematuria and urgency.  Musculoskeletal: Negative for arthralgias, back pain, gait problem and neck pain.       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   Skin: Negative for color change and rash.  Allergic/Immunologic: Negative for environmental allergies and food allergies.  Neurological: Positive for weakness. Negative for dizziness and headaches.  Hematological: Negative for adenopathy. Does not bruise/bleed easily.  Psychiatric/Behavioral: Positive for sleep disturbance. Negative for agitation, behavioral problems (depression) and hallucinations. The patient is nervous/anxious.        Improved since adding low dose alprazolam at bedtime    Today's Vitals   05/29/18 1457  BP: 137/76  Pulse: 98  Resp: 16  SpO2: 95%  Weight: 105 lb (47.6 kg)  Height: 4\' 1"  (1.245 m)    Physical Exam  Constitutional: She is oriented to person, place, and time. She appears well-developed and well-nourished. No distress.  HENT:  Head: Normocephalic and atraumatic.  Nose:  Nose normal.  Mouth/Throat: Oropharynx is clear and moist. No oropharyngeal exudate.  Eyes: Pupils are equal, round, and reactive to light. Conjunctivae and EOM are normal.  Neck: Normal range of motion. Neck supple. No JVD present. Carotid bruit is not present. No tracheal deviation present. No thyromegaly present.  Cardiovascular: Normal rate, regular rhythm and normal heart sounds. Exam reveals no gallop and no friction rub.  No murmur heard. Pulmonary/Chest: Effort normal and breath sounds normal. No respiratory distress. She has no wheezes. She has no rales. She exhibits no tenderness.  Abdominal: Soft. Bowel sounds are normal. There is no tenderness.  Musculoskeletal: Normal range of motion.  She is above the knee amputee, bilaterally. Tenderness at amputation site of both legs. No redness, swelling, or evidence of infection or abnormalities. She currently uses a manual wheelchair.  Lymphadenopathy:    She has no cervical adenopathy.  Neurological: She is alert and oriented to person, place, and time. No cranial nerve deficit.  Skin: Skin is warm and dry. Capillary refill takes 2 to 3 seconds. She is not diaphoretic.  Psychiatric: She has a normal mood and affect. Her behavior is normal. Judgment and thought content normal.  Nursing note and vitals reviewed.  Assessment/Plan: 1. Essential hypertension Stable. Continue bp medication as prescribed   2. Adjustment insomnia Imrpoved. May continue alprazolam 0.25mg  at bedtime as needed. New rx sent to her pharmacy  - ALPRAZolam (XANAX) 0.25 MG tablet; Take 1 tablet (0.25 mg total) by mouth at bedtime as needed for anxiety.  Dispense: 30 tablet; Refill: 1  3. Vitamin D deficiency Very low on recent labs. Add drisdol 50000iu weekly for next 6 months.  - ergocalciferol (DRISDOL) 50000 units capsule; Take 1 capsule (50,000 Units total) by mouth once a week.  Dispense: 4 capsule; Refill: 5  4. Need for vaccination against Streptococcus  pneumoniae using pneumococcal conjugate vaccine 13 - Pneumococcal conjugate vaccine 13-valent IM  General Counseling: Tyonna verbalizes understanding of the findings of todays visit and agrees with plan of treatment. I have discussed any further diagnostic evaluation that may be needed or ordered today. We also reviewed her medications today. she has been encouraged to call the office with any questions or concerns that should arise related to todays visit.  This patient was seen by Herbert Seta  Mazella Deen FNP Collaboration with Dr Lyndon Code as a part of collaborative care agreement  Orders Placed This Encounter  Procedures  . Pneumococcal conjugate vaccine 13-valent IM    Meds ordered this encounter  Medications  . ergocalciferol (DRISDOL) 50000 units capsule    Sig: Take 1 capsule (50,000 Units total) by mouth once a week.    Dispense:  4 capsule    Refill:  5    Order Specific Question:   Supervising Provider    Answer:   Lyndon Code [[id number]]  . DISCONTD: ALPRAZolam (XANAX) 0.25 MG tablet    Sig: Take 1 tablet (0.25 mg total) by mouth at bedtime as needed for anxiety.    Dispense:  30 tablet    Refill:  1    Order Specific Question:   Supervising Provider    Answer:   Lyndon Code [[id number]]  . ALPRAZolam (XANAX) 0.25 MG tablet    Sig: Take 1 tablet (0.25 mg total) by mouth at bedtime as needed for anxiety.    Dispense:  30 tablet    Refill:  1    Order Specific Question:   Supervising Provider    Answer:   Lyndon Code [[id number]]    Time spent: 10 Minutes      Dr Lyndon Code Internal medicine

## 2018-06-20 ENCOUNTER — Encounter (INDEPENDENT_AMBULATORY_CARE_PROVIDER_SITE_OTHER): Payer: Medicare Other

## 2018-09-16 ENCOUNTER — Other Ambulatory Visit: Payer: Self-pay

## 2018-09-16 DIAGNOSIS — I10 Essential (primary) hypertension: Secondary | ICD-10-CM

## 2018-09-16 MED ORDER — LISINOPRIL 20 MG PO TABS
20.0000 mg | ORAL_TABLET | Freq: Every day | ORAL | 5 refills | Status: DC
Start: 2018-09-16 — End: 2018-12-30

## 2018-09-18 ENCOUNTER — Other Ambulatory Visit: Payer: Self-pay | Admitting: Nurse Practitioner

## 2018-09-18 DIAGNOSIS — F5102 Adjustment insomnia: Secondary | ICD-10-CM

## 2018-09-18 MED ORDER — ALPRAZOLAM 0.25 MG PO TABS: 0.2500 mg | ORAL_TABLET | Freq: Every evening | ORAL | 0 refills | Status: DC | PRN

## 2018-09-18 NOTE — Progress Notes (Signed)
Filled patient's prescription for alprazolam 0.25mg  at bedtime as needed for anxiety/insmonia. Filled #14 tablets. Will fill remainder at visit 09/30/2018 at her visit.

## 2018-09-27 ENCOUNTER — Ambulatory Visit: Payer: Self-pay | Admitting: Nurse Practitioner

## 2018-09-30 ENCOUNTER — Ambulatory Visit: Payer: Self-pay | Admitting: Nurse Practitioner

## 2018-09-30 ENCOUNTER — Encounter: Payer: Self-pay | Admitting: Nurse Practitioner

## 2018-09-30 ENCOUNTER — Ambulatory Visit: Payer: Medicare Other | Admitting: Nurse Practitioner

## 2018-09-30 VITALS — BP 145/74 | HR 81 | Resp 16 | Ht <= 58 in | Wt 105.0 lb

## 2018-09-30 DIAGNOSIS — M79605 Pain in left leg: Secondary | ICD-10-CM | POA: Diagnosis not present

## 2018-09-30 DIAGNOSIS — R0989 Other specified symptoms and signs involving the circulatory and respiratory systems: Secondary | ICD-10-CM | POA: Diagnosis not present

## 2018-09-30 DIAGNOSIS — M79604 Pain in right leg: Secondary | ICD-10-CM | POA: Diagnosis not present

## 2018-09-30 DIAGNOSIS — G8929 Other chronic pain: Secondary | ICD-10-CM

## 2018-09-30 DIAGNOSIS — I10 Essential (primary) hypertension: Secondary | ICD-10-CM

## 2018-09-30 DIAGNOSIS — F5102 Adjustment insomnia: Secondary | ICD-10-CM

## 2018-09-30 MED ORDER — ALPRAZOLAM 0.25 MG PO TABS: 0.2500 mg | ORAL_TABLET | Freq: Every evening | ORAL | 3 refills | Status: DC | PRN

## 2018-09-30 MED ORDER — HYDROCODONE-ACETAMINOPHEN 5-325 MG PO TABS: 1.0000 | ORAL_TABLET | Freq: Four times a day (QID) | ORAL | 0 refills | Status: DC | PRN

## 2018-09-30 NOTE — Progress Notes (Signed)
[facility] [address] [place], [place] [address]  Internal MEDICINE  Office Visit Note  Patient Name: [name]  [id number]  [id number]  Date of Service: 09/30/2018  Chief Complaint  Patient presents with  . Medicare Wellness    4 month well visit    Added low dose alprazolam 0.25mg , which may be taken at bedtime to help her sleep. She states that she has done very well with this. She is able to fall asleep and stay asleep for a few hours. She does not feel groggy the next day. She would like to continue this if possible.  Continues to have lower back pain and pain in both lower legs. She is bilateral amputee and has phantom pain at stump area. She will take hydrocodone/APAP 5/325mg  tablets as needed for this pain. Last prescription for #65 tablets was given 02/2018. She takes this only when absolutely necessary. She does need a new prescription for this.          Current Medication: Outpatient Encounter Medications as of 09/30/2018  Medication Sig  . ALPRAZolam (XANAX) 0.25 MG tablet Take 1 tablet (0.25 mg total) by mouth at bedtime as needed for anxiety.  Marland Kitchen aspirin 81 MG tablet Take 81 mg by mouth every morning.   Marland Kitchen atorvastatin (LIPITOR) 40 MG tablet TAKE 1 TABLET BY MOUTH AT BEDTIME  . ELIQUIS 2.5 MG TABS tablet TAKE 1 TABLET(S) BY MOUTH TWICE A DAY FOR DVT PROPHYLAZIS  . ergocalciferol (DRISDOL) 50000 units capsule Take 1 capsule (50,000 Units total) by mouth once a week.  . famotidine (PEPCID) 20 MG tablet Take 1 tablet (20 mg total) by mouth every morning.  . gabapentin (NEURONTIN) 300 MG capsule TAKE ONE CAPSULE BY MOUTH 3 TIMES A DAY  . HYDROcodone-acetaminophen (NORCO/VICODIN) 5-325 MG tablet Take 1 tablet by mouth every 6 (six) hours as needed for moderate pain.  Marland Kitchen lisinopril (PRINIVIL,ZESTRIL) 20 MG tablet Take 1 tablet (20 mg total) by mouth daily.  . magnesium hydroxide (MILK OF MAGNESIA) 400 MG/5ML suspension Take 30 mLs by mouth daily as  needed for mild constipation.  . traMADol (ULTRAM) 50 MG tablet Take 50 mg by mouth as needed.  . [DISCONTINUED] ALPRAZolam (XANAX) 0.25 MG tablet Take 1 tablet (0.25 mg total) by mouth at bedtime as needed for anxiety.  . [DISCONTINUED] HYDROcodone-acetaminophen (NORCO/VICODIN) 5-325 MG tablet Take 1 tablet by mouth every 6 (six) hours as needed for moderate pain.   No facility-administered encounter medications on file as of 09/30/2018.     Surgical History: Past Surgical History:  Procedure Laterality Date  . ABOVE KNEE LEG AMPUTATION Right 01/2015  . AMPUTATION Left 07/16/2015   Procedure: AMPUTATION ABOVE KNEE;  Surgeon: Renford Dills, MD;  Location: [facility];  Service: Vascular;  Laterality: Left;  . AMPUTATION Left 07/23/2015   Procedure:   WOUND CLOSURE;  Surgeon: Renford Dills, MD;  Location: [facility];  Service: Vascular;  Laterality: Left;  . AMPUTATION Left 11/19/2015   Procedure:  ( REVISION ) AMPUTATION ABOVE KNEE ;  Surgeon: Renford Dills, MD;  Location: [facility];  Service: Vascular;  Laterality: Left;  . APPENDECTOMY  1969  . BREAST BIOPSY Right 1970   neg  . ECTOPIC PREGNANCY SURGERY Right 1969  . FOREIGN BODY REMOVAL Left 07/28/2016   Procedure: FOREIGN BODY REMOVAL ADULT ( LEG );  Surgeon: Renford Dills, MD;  Location: [facility];  Service: Vascular;  Laterality: Left;  . PERIPHERAL VASCULAR CATHETERIZATION N/A 07/22/2015  Procedure: Abdominal Aortogram w/Lower Extremity;  Surgeon: Annice NeedyJason S Dew, MD;  Location: [facility];  Service: Cardiovascular;  Laterality: N/A;  . PERIPHERAL VASCULAR CATHETERIZATION  07/22/2015   Procedure: Lower Extremity Intervention;  Surgeon: Annice NeedyJason S Dew, MD;  Location: [facility];  Service: Cardiovascular;;  . stents left leg      Medical History: Past Medical History:  Diagnosis Date  . Hypertension   . Peripheral vascular disease (HCC)   . Pneumonia 07/14/15  . Tobacco abuse   . Unilateral small kidney     a. one functional kidney    Family History: Family History  Problem Relation Age of Onset  . CAD Mother   . Hypertension Mother   . Stroke Mother   . CAD Father   . Hypertension Father   . Breast cancer Neg Hx     Social History   Socioeconomic History  . Marital status: Married    Spouse name: Not on file  . Number of children: Not on file  . Years of education: Not on file  . Highest education level: Not on file  Occupational History  . Not on file  Social Needs  . Financial resource strain: Not on file  . Food insecurity:    Worry: Not on file    Inability: Not on file  . Transportation needs:    Medical: Not on file    Non-medical: Not on file  Tobacco Use  . Smoking status: Light Tobacco Smoker    Packs/day: 0.25    Years: 55.00    Pack years: 13.75    Last attempt to quit: 01/20/2015    Years since quitting: 3.6  . Smokeless tobacco: Never Used  Substance and Sexual Activity  . Alcohol use: No  . Drug use: No  . Sexual activity: Not on file  Lifestyle  . Physical activity:    Days per week: Not on file    Minutes per session: Not on file  . Stress: Not on file  Relationships  . Social connections:    Talks on phone: Not on file    Gets together: Not on file    Attends religious service: Not on file    Active member of club or organization: Not on file    Attends meetings of clubs or organizations: Not on file    Relationship status: Not on file  . Intimate partner violence:    Fear of current or ex partner: Not on file    Emotionally abused: Not on file    Physically abused: Not on file    Forced sexual activity: Not on file  Other Topics Concern  . Not on file  Social History Narrative  . Not on file      Review of Systems  Constitutional: Negative for activity change, chills, diaphoresis, fatigue and unexpected weight change.  HENT: Negative for ear pain, postnasal drip and sinus pressure.   Eyes: Negative.   Respiratory: Negative for  cough, chest tightness, shortness of breath and wheezing.   Cardiovascular: Negative for chest pain, palpitations and leg swelling.       Good blood pressure control   Gastrointestinal: Negative for abdominal pain, constipation, diarrhea, nausea and vomiting.  Endocrine: Negative for cold intolerance, heat intolerance, polydipsia, polyphagia and polyuria.  Genitourinary: Negative.   Musculoskeletal: Negative for arthralgias, back pain, gait problem and neck pain.       History of above the knee amputation, bilaterally. Has phantom limb pain, both  legs. Also has lower back pain since moving from her home into new home.   Skin: Negative for color change and rash.  Allergic/Immunologic: Negative for environmental allergies and food allergies.  Neurological: Positive for weakness. Negative for dizziness and headaches.  Hematological: Negative for adenopathy. Does not bruise/bleed easily.  Psychiatric/Behavioral: Positive for sleep disturbance. Negative for agitation, behavioral problems (depression) and hallucinations. The patient is nervous/anxious.        Improved since adding low dose alprazolam at bedtime   Today's Vitals   09/30/18 1450  BP: (!) 145/74  Pulse: 81  Resp: 16  SpO2: 95%  Weight: 105 lb (47.6 kg)  Height: 4' (1.219 m)    Physical Exam Vitals signs and nursing note reviewed.  Constitutional:      General: She is not in acute distress.    Appearance: Normal appearance. She is well-developed. She is not diaphoretic.  HENT:     Head: Normocephalic and atraumatic.     Nose: Nose normal.     Mouth/Throat:     Pharynx: No oropharyngeal exudate.  Eyes:     Conjunctiva/sclera: Conjunctivae normal.     Pupils: Pupils are equal, round, and reactive to light.  Neck:     Musculoskeletal: Normal range of motion and neck supple.     Thyroid: No thyromegaly.     Vascular: Carotid bruit present. No JVD.     Trachea: No tracheal deviation.     Comments: Soft bruit auscultated  over the left carotid artery.  Cardiovascular:     Rate and Rhythm: Normal rate and regular rhythm.     Heart sounds: Normal heart sounds. No murmur. No friction rub. No gallop.   Pulmonary:     Effort: Pulmonary effort is normal. No respiratory distress.     Breath sounds: Normal breath sounds. No wheezing or rales.  Chest:     Chest wall: No tenderness.  Abdominal:     General: Bowel sounds are normal.     Palpations: Abdomen is soft.     Tenderness: There is no abdominal tenderness.  Musculoskeletal: Normal range of motion.     Comments: She is above the knee amputee, bilaterally. Tenderness at amputation site of both legs. No redness, swelling, or evidence of infection or abnormalities. She currently uses a manual wheelchair.  Lymphadenopathy:     Cervical: No cervical adenopathy.  Skin:    General: Skin is warm and dry.     Capillary Refill: Capillary refill takes 2 to 3 seconds.  Neurological:     General: No focal deficit present.     Mental Status: She is alert and oriented to person, place, and time.     Cranial Nerves: No cranial nerve deficit.  Psychiatric:        Mood and Affect: Mood normal.        Behavior: Behavior normal.        Thought Content: Thought content normal.        Judgment: Judgment normal.    Assessment/Plan:  1. Essential hypertension Blood pressure well controlled. Continue bp medications as prescribed   2. Left carotid bruit Carotid doppler ordered for further evaluation.  - US Carotid Duplex Bilateral; Future  3. Chronic pain of both lower extremities May continue hydrocodone/APAP 5/325mg  as needed and as prescribed. New prescription for #65 tablets was sent to her pharmacy. Last prescription given lasted for over six months. Reviewed risks and possible side effects associated with taking narcotic pain medication.  - HYDROcodone-acetaminophen (NORCO/VICODIN)  5-325 MG tablet; Take 1 tablet by mouth every 6 (six) hours as needed for moderate  pain.  Dispense: 65 tablet; Refill: 0  4. Adjustment insomnia May take alprazolam 0.25mg  at bedtime as needed for anxiety/insomnia. New prescription sent to her pharmacy today.  - ALPRAZolam (XANAX) 0.25 MG tablet; Take 1 tablet (0.25 mg total) by mouth at bedtime as needed for anxiety.  Dispense: 30 tablet; Refill: 3   General Counseling: breckyn troyer understanding of the findings of todays visit and agrees with plan of treatment. I have discussed any further diagnostic evaluation that may be needed or ordered today. We also reviewed her medications today. she has been encouraged to call the office with any questions or concerns that should arise related to todays visit.  Reviewed risks and possible side effects associated with taking opiates, benzodiazepines and other CNS depressants. Combination of these could cause dizziness and drowsiness. Advised patient not to drive or operate machinery when taking these medications, as patient's and other's life can be at risk and will have consequences. Patient verbalized understanding in this matter. Dependence and abuse for these drugs will be monitored closely. A Controlled substance policy and procedure is on file which allows [facility] to order a urine drug screen test at any visit. Patient understands and agrees with the plan  This patient was seen by Vincent Gros FNP Collaboration with Dr Lyndon Code as a part of collaborative care agreement  Orders Placed This Encounter  Procedures  . US Carotid Duplex Bilateral    Meds ordered this encounter  Medications  . HYDROcodone-acetaminophen (NORCO/VICODIN) 5-325 MG tablet    Sig: Take 1 tablet by mouth every 6 (six) hours as needed for moderate pain.    Dispense:  65 tablet    Refill:  0    Prescription is not for post op pain and is not new prescription. This is chronic/ongoing pain for which she takes this medication only as needed.    Order Specific Question:    Supervising Provider    Answer:   Lyndon Code [[id number]]  . ALPRAZolam (XANAX) 0.25 MG tablet    Sig: Take 1 tablet (0.25 mg total) by mouth at bedtime as needed for anxiety.    Dispense:  30 tablet    Refill:  3    Order Specific Question:   Supervising Provider    Answer:   Lyndon Code [[id number]]    Time spent: 82 Minutes      Dr Lyndon Code Internal medicine

## 2018-10-03 ENCOUNTER — Other Ambulatory Visit: Payer: Self-pay | Admitting: Nurse Practitioner

## 2018-10-03 ENCOUNTER — Telehealth: Payer: Self-pay

## 2018-10-03 DIAGNOSIS — F5102 Adjustment insomnia: Secondary | ICD-10-CM

## 2018-10-03 MED ORDER — ALPRAZOLAM 0.25 MG PO TABS: 0.2500 mg | ORAL_TABLET | Freq: Every evening | ORAL | 3 refills | Status: DC | PRN

## 2018-10-03 NOTE — Telephone Encounter (Signed)
I would rather send the rx to [facility].  Because, in the past weeks, I have been told, multiple times, that [facility] has the 0.5mg  tablets on backorder. Please let me know if she is ok to have me fill it there. Thanks.

## 2018-10-03 NOTE — Telephone Encounter (Signed)
Pt advised that we send med to [facility]

## 2018-10-03 NOTE — Progress Notes (Signed)
Patient's regular pharmacy did not have alprazolam 0.25mg  tablets. [facility] [address] Patient notified

## 2018-11-01 ENCOUNTER — Other Ambulatory Visit: Payer: Self-pay

## 2018-11-01 ENCOUNTER — Ambulatory Visit: Payer: Medicare HMO

## 2018-11-01 DIAGNOSIS — I6523 Occlusion and stenosis of bilateral carotid arteries: Secondary | ICD-10-CM

## 2018-11-01 DIAGNOSIS — R0989 Other specified symptoms and signs involving the circulatory and respiratory systems: Secondary | ICD-10-CM

## 2018-11-10 NOTE — Procedures (Addendum)
[facility] [address] [place], [place] [id number]  DATE OF SERVICE: November 01, 2018  CAROTID DOPPLER INTERPRETATION:  Bilateral Carotid Ultrsasound and Color Doppler Examination was performed. The RIGHT CCA shows severe plaque in the vessel. The LEFT CCA shows severe plaque in the vessel. There was no significant intimal thickening noted in the RIGHT carotid artery. There was no significant intimal thickening in the LEFT carotid artery.  The RIGHT CCA shows peak systolic velocity of 63 cm per second. The end diastolic velocity is 15 cm per second on the RIGHT side. The RIGHT ICA shows peak systolic velocity of 323 per second. RIGHT sided ICA end diastolic velocity is 81 cm per second. The RIGHT ECA shows a peak systolic velocity of 380 cm per second. The ICA/CCA ratio is calculated to be 5.1. This suggests stenosis of greater than 70%. The Vertebral Artery shows antegrade flow.  The LEFT CCA shows peak systolic velocity of 81 cm per second. The end diastolic velocity is 13 cm per second on the LEFT side. The LEFT ICA shows peak systolic velocity of 325 per second. LEFT sided ICA end diastolic velocity is 77 cm per second. The LEFT ECA shows a peak systolic velocity of 127 cm per second. The ICA/CCA ratio is calculated to be 4.0. This suggests stenosis of greater than 70%. The Vertebral Artery shows antegrade flow.   Impression:    The RIGHT CAROTID shows greater than 70% stenosis. The LEFT CAROTID shows greater than 70% stenosis.  There is severe plaque formation noted on the LEFT and severe on the RIGHT  side. Consider a repeat Carotid doppler if clinical situation and symptoms warrant in 6-12 months. Patient should be encouraged to change lifestyles such as smoking cessation, regular exercise and dietary modification. Use of statins in the right clinical setting and ASA is encouraged.  Yevonne PaxSaadat A Khan, MD [facility] Pul[facility] Critical [facility] Medicine

## 2018-11-15 ENCOUNTER — Telehealth: Payer: Self-pay

## 2018-11-15 NOTE — Telephone Encounter (Signed)
Please let the patient know that her carotid doppler study showing plaque build up and significant hardening of the arteries. We need to set up CTA of the neck for further evaluation. Thanks Please send to beth to set up the study. Thanks.

## 2018-11-15 NOTE — Telephone Encounter (Signed)
Pt advised for cartoid doppler result and send beth reminder

## 2018-11-21 ENCOUNTER — Other Ambulatory Visit: Payer: Self-pay | Admitting: Nurse Practitioner

## 2018-11-21 DIAGNOSIS — I6523 Occlusion and stenosis of bilateral carotid arteries: Secondary | ICD-10-CM

## 2018-12-03 ENCOUNTER — Telehealth: Payer: Self-pay

## 2018-12-03 NOTE — Telephone Encounter (Signed)
Tried calling patient to schedule follow up sooner to discuss ultrasound results. [name]

## 2018-12-03 NOTE — Telephone Encounter (Signed)
-----   Message from Carlean Jews, NP sent at 12/02/2018  5:51 PM EST ----- Can we go ahead and set her up with appointment to discuss results and family history? Thanks.  ----- Message ----- From: Antonieta Iba Sent: 12/02/2018   9:44 AM EST To: Carlean Jews, NP, Lyndon Code, MD  Per insurance they are requiring patient has exam after ultrasound to review personal history and family history and re order cta. This procedure has been denied [employer]

## 2018-12-30 ENCOUNTER — Encounter: Payer: Self-pay | Admitting: Nurse Practitioner

## 2018-12-30 ENCOUNTER — Ambulatory Visit (INDEPENDENT_AMBULATORY_CARE_PROVIDER_SITE_OTHER): Payer: Medicare HMO | Admitting: Nurse Practitioner

## 2018-12-30 ENCOUNTER — Other Ambulatory Visit: Payer: Self-pay

## 2018-12-30 VITALS — BP 148/80 | HR 76 | Resp 16 | Ht <= 58 in | Wt 105.0 lb

## 2018-12-30 DIAGNOSIS — G8929 Other chronic pain: Secondary | ICD-10-CM

## 2018-12-30 DIAGNOSIS — I700 Atherosclerosis of aorta: Secondary | ICD-10-CM | POA: Diagnosis not present

## 2018-12-30 DIAGNOSIS — R0989 Other specified symptoms and signs involving the circulatory and respiratory systems: Secondary | ICD-10-CM | POA: Diagnosis not present

## 2018-12-30 DIAGNOSIS — K219 Gastro-esophageal reflux disease without esophagitis: Secondary | ICD-10-CM

## 2018-12-30 DIAGNOSIS — M79605 Pain in left leg: Secondary | ICD-10-CM | POA: Diagnosis not present

## 2018-12-30 DIAGNOSIS — M79604 Pain in right leg: Secondary | ICD-10-CM

## 2018-12-30 DIAGNOSIS — F5102 Adjustment insomnia: Secondary | ICD-10-CM

## 2018-12-30 DIAGNOSIS — I10 Essential (primary) hypertension: Secondary | ICD-10-CM | POA: Diagnosis not present

## 2018-12-30 DIAGNOSIS — R69 Illness, unspecified: Secondary | ICD-10-CM | POA: Diagnosis not present

## 2018-12-30 MED ORDER — ATORVASTATIN CALCIUM 40 MG PO TABS: 40.0000 mg | ORAL_TABLET | Freq: Every day | ORAL | 5 refills | Status: DC

## 2018-12-30 MED ORDER — HYDROCODONE-ACETAMINOPHEN 5-325 MG PO TABS: 1.0000 | ORAL_TABLET | Freq: Four times a day (QID) | ORAL | 0 refills | Status: DC | PRN

## 2018-12-30 MED ORDER — FAMOTIDINE 20 MG PO TABS: 20.0000 mg | ORAL_TABLET | ORAL | 5 refills | Status: DC

## 2018-12-30 MED ORDER — LISINOPRIL 20 MG PO TABS: 20.0000 mg | ORAL_TABLET | Freq: Every day | ORAL | 5 refills | Status: DC

## 2018-12-30 MED ORDER — ELIQUIS 2.5 MG PO TABS: 2.5000 mg | ORAL_TABLET | Freq: Two times a day (BID) | ORAL | 3 refills | Status: DC

## 2018-12-30 MED ORDER — ALPRAZOLAM 0.25 MG PO TABS: 0.2500 mg | ORAL_TABLET | Freq: Every evening | ORAL | 3 refills | Status: DC | PRN

## 2018-12-30 NOTE — Progress Notes (Signed)
Pt blood pressure elevated, taken twice 1st reading 165/73 machine 2nd reading 154/88 manually Informed pt of high blood pressure.

## 2018-12-30 NOTE — Progress Notes (Signed)
[facility] [address] [place], [place] [address]  Internal MEDICINE  Office Visit Note  Patient Name: [name]  [id number]  [id number]  Date of Service: 12/31/2018  Chief Complaint  Patient presents with  . Medical Management of Chronic Issues    3 month follow up  . Labs Only    Review carotid  . Hypertension    The patient is here for routine follow up visit. She recently had carotid doppler study. This showed severe plaque with greater than 70% stenosis bilaterally. A CTA had been ordered and denied per her insurance. She is currently taking atorvastatin and aspirin. eliquis has been prescribed I nthe past, however, she had been unable to afford this in the past. Appears that her insurance covers this now. She does smoke, and is not ready to stop. She does not wish to have the CTA of her neck at this time and she does not wish to go to vein and vascular at this time. 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       Current Medication: Outpatient Encounter Medications as of 12/30/2018  Medication Sig  . ALPRAZolam (XANAX) 0.25 MG tablet Take 1 tablet (0.25 mg total) by mouth at bedtime as needed for anxiety.  Marland Kitchen aspirin 81 MG tablet Take 81 mg by mouth every morning.   Marland Kitchen atorvastatin (LIPITOR) 40 MG tablet Take 1 tablet (40 mg total) by mouth at bedtime.  Marland Kitchen ELIQUIS 2.5 MG TABS tablet Take 1 tablet (2.5 mg total) by mouth 2 (two) times daily.  . ergocalciferol (DRISDOL) 50000 units capsule Take 1 capsule (50,000 Units total) by mouth once a week.  . famotidine (PEPCID) 20 MG tablet Take 1 tablet (20 mg total) by mouth every morning.  Marland Kitchen HYDROcodone-acetaminophen (NORCO/VICODIN) 5-325 MG tablet Take 1 tablet by mouth every 6 (six) hours as needed for moderate pain.  Marland Kitchen lisinopril (PRINIVIL,ZESTRIL) 20 MG tablet Take 1 tablet (20 mg total) by mouth daily.  . magnesium hydroxide (MILK OF MAGNESIA)  400 MG/5ML suspension Take 30 mLs by mouth daily as needed for mild constipation.  . traMADol (ULTRAM) 50 MG tablet Take 50 mg by mouth as needed.  . [DISCONTINUED] ALPRAZolam (XANAX) 0.25 MG tablet Take 1 tablet (0.25 mg total) by mouth at bedtime as needed for anxiety.  . [DISCONTINUED] atorvastatin (LIPITOR) 40 MG tablet TAKE 1 TABLET BY MOUTH AT BEDTIME  . [DISCONTINUED] ELIQUIS 2.5 MG TABS tablet TAKE 1 TABLET(S) BY MOUTH TWICE A DAY FOR DVT PROPHYLAZIS  . [DISCONTINUED] famotidine (PEPCID) 20 MG tablet Take 1 tablet (20 mg total) by mouth every morning.  . [DISCONTINUED] gabapentin (NEURONTIN) 300 MG capsule TAKE ONE CAPSULE BY MOUTH 3 TIMES A DAY  . [DISCONTINUED] HYDROcodone-acetaminophen (NORCO/VICODIN) 5-325 MG tablet Take 1 tablet by mouth every 6 (six) hours as needed for moderate pain.  . [DISCONTINUED] lisinopril (PRINIVIL,ZESTRIL) 20 MG tablet Take 1 tablet (20 mg total) by mouth daily.   No facility-administered encounter medications on file as of 12/30/2018.     Surgical History: Past Surgical History:  Procedure Laterality Date  . ABOVE KNEE LEG AMPUTATION Right 01/2015  . AMPUTATION Left 07/16/2015   Procedure: AMPUTATION ABOVE KNEE;  Surgeon: Renford Dills, MD;  Location: [facility] ORS;  Service: Vascular;  Laterality: Left;  . AMPUTATION Left 07/23/2015   Procedure:   WOUND CLOSURE;  Surgeon: Renford Dills, MD;  Location: [facility] ORS;  Service: Vascular;  Laterality:  Left;  . AMPUTATION Left 11/19/2015   Procedure:  ( REVISION ) AMPUTATION ABOVE KNEE ;  Surgeon: Renford Dills, MD;  Location: [facility] ORS;  Service: Vascular;  Laterality: Left;  . APPENDECTOMY  1969  . BREAST BIOPSY Right 1970   neg  . ECTOPIC PREGNANCY SURGERY Right 1969  . FOREIGN BODY REMOVAL Left 07/28/2016   Procedure: FOREIGN BODY REMOVAL ADULT ( LEG );  Surgeon: Renford Dills, MD;  Location: [facility] ORS;  Service: Vascular;  Laterality: Left;  . PERIPHERAL VASCULAR CATHETERIZATION N/A 07/22/2015    Procedure: Abdominal Aortogram w/Lower Extremity;  Surgeon: Annice Needy, MD;  Location: [facility] INVASIVE CV LAB;  Service: Cardiovascular;  Laterality: N/A;  . PERIPHERAL VASCULAR CATHETERIZATION  07/22/2015   Procedure: Lower Extremity Intervention;  Surgeon: Annice Needy, MD;  Location: [facility] INVASIVE CV LAB;  Service: Cardiovascular;;  . stents left leg      Medical History: Past Medical History:  Diagnosis Date  . Hypertension   . Peripheral vascular disease (HCC)   . Pneumonia 07/14/15  . Tobacco abuse   . 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 CAD Mother   . Hypertension Mother   . Stroke Mother   . CAD Father   . Hypertension Father   . Breast cancer Neg Hx     Social History   Socioeconomic History  . Marital status: Married    Spouse name: Not on file  . Number of children: Not on file  . Years of education: Not on file  . Highest education level: Not on file  Occupational History  . Not on file  Social Needs  . Financial resource strain: Not on file  . Food insecurity:    Worry: Not on file    Inability: Not on file  . Transportation needs:    Medical: Not on file    Non-medical: Not on file  Tobacco Use  . Smoking status: Light Tobacco Smoker    Packs/day: 0.25    Years: 55.00    Pack years: 13.75    Last attempt to quit: 01/20/2015    Years since quitting: 3.9  . Smokeless tobacco: Never Used  Substance and Sexual Activity  . Alcohol use: No  . Drug use: No  . Sexual activity: Not on file  Lifestyle  . Physical activity:    Days per week: Not on file    Minutes per session: Not on file  . Stress: Not on file  Relationships  . Social connections:    Talks on phone: Not on file    Gets together: Not on file    Attends religious service: Not on file    Active member of club or organization: Not on file    Attends meetings of clubs or organizations: Not on file     Relationship status: Not on file  . Intimate partner violence:    Fear of current or ex partner: Not on file    Emotionally abused: Not on file    Physically abused: Not on file    Forced sexual activity: Not on file  Other Topics Concern  . Not on file  Social History Narrative  . Not on file      Review of Systems  Constitutional: Negative for activity change, chills, diaphoresis, fatigue and unexpected weight change.  HENT: Negative for ear pain, postnasal drip and sinus pressure.  Respiratory: Negative for cough, chest tightness, shortness of breath and wheezing.   Cardiovascular: Negative for chest pain, palpitations and leg swelling.       Good blood pressure control   Gastrointestinal: Negative for abdominal pain, constipation, diarrhea, nausea and vomiting.  Endocrine: Negative for cold intolerance, heat intolerance, polydipsia and polyuria.  Musculoskeletal: Negative for arthralgias, back pain, gait problem and neck pain.       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 Also has lower back pain since moving from her home into new home.   Skin: Negative for color change and rash.  Allergic/Immunologic: Negative for environmental allergies and food allergies.  Neurological: Positive for weakness. Negative for dizziness and headaches.  Hematological: Negative for adenopathy. Does not bruise/bleed easily.  Psychiatric/Behavioral: Positive for sleep disturbance. Negative for agitation, behavioral problems (depression) and hallucinations. The patient is nervous/anxious.        Improved since adding low dose alprazolam at bedtime    Today's Vitals   12/30/18 1518 12/30/18 1625  BP: (!) 154/88 (!) 148/80  Pulse: 76   Resp: 16   SpO2: 100%   Weight: 105 lb (47.6 kg)   Height: 4' (1.219 m)    Body mass index is 32.04 kg/m.  Physical Exam Vitals signs and nursing note reviewed.  Constitutional:      General: She is not in acute  distress.    Appearance: Normal appearance. She is well-developed. She is not diaphoretic.  HENT:     Head: Normocephalic and atraumatic.     Nose: Nose normal.     Mouth/Throat:     Pharynx: No oropharyngeal exudate.  Eyes:     Conjunctiva/sclera: Conjunctivae normal.     Pupils: Pupils are equal, round, and reactive to light.  Neck:     Musculoskeletal: Normal range of motion and neck supple.     Thyroid: No thyromegaly.     Vascular: Carotid bruit present. No JVD.     Trachea: No tracheal deviation.     Comments: Soft bruit auscultated over the left carotid artery.  Cardiovascular:     Rate and Rhythm: Normal rate and regular rhythm.     Heart sounds: Normal heart sounds. No murmur. No friction rub. No gallop.   Pulmonary:     Effort: Pulmonary effort is normal. No respiratory distress.     Breath sounds: Normal breath sounds. No wheezing or rales.  Chest:     Chest wall: No tenderness.  Abdominal:     General: Bowel sounds are normal.     Palpations: Abdomen is soft.     Tenderness: There is no abdominal tenderness.  Musculoskeletal: Normal range of motion.     Comments: She is above the knee amputee, bilaterally. Tenderness at amputation site of both legs. No redness, swelling, or evidence of infection or abnormalities. She currently uses a manual wheelchair.  Lymphadenopathy:     Cervical: No cervical adenopathy.  Skin:    General: Skin is warm and dry.     Capillary Refill: Capillary refill takes 2 to 3 seconds.  Neurological:     General: No focal deficit present.     Mental Status: She is alert and oriented to person, place, and time.     Cranial Nerves: No cranial nerve deficit.  Psychiatric:        Mood and Affect: Mood normal.        Behavior: Behavior normal.        Thought  Content: Thought content normal.        Judgment: Judgment normal.   Assessment/Plan: 1. Atherosclerosis of abdominal aorta (HCC) Restart eliquis 2.5mg  twice daily. Continue atorvastatin  40mg  every evening. Most recent lipid panel very well controlled  - ELIQUIS 2.5 MG TABS tablet; Take 1 tablet (2.5 mg total) by mouth 2 (two) times daily.  Dispense: 60 tablet; Refill: 3 - atorvastatin (LIPITOR) 40 MG tablet; Take 1 tablet (40 mg total) by mouth at bedtime.  Dispense: 30 tablet; Refill: 5  2. Left carotid bruit Reviewed carotid doppler study with patient. Did show severe plaque with >70% stenosis bilaterally. Discussed risks and implications of this with her and she did voice understanding. Recommended CTA of the neck for further evaluation and referral to vein and vascular for possible treatment. For now, she does not wish to pursue this further. Will continue with atorvastatin and aspirin every day. Added back eliquis 2.5mg  twice daily. Repeat caroitd dopper in six months and revaluate at that time.   3. Essential hypertension Stable. Continue bp medication as prescribed . - lisinopril (PRINIVIL,ZESTRIL) 20 MG tablet; Take 1 tablet (20 mg total) by mouth daily.  Dispense: 30 tablet; Refill: 5  4. Chronic pain of both lower extremities May continue hydrocodone/APAP 5/325mg  as needed and as prescribed. A new prescription for #65 tablets was sent to her pharmacy. This prescription will generally last 3 to 6 months.  - HYDROcodone-acetaminophen (NORCO/VICODIN) 5-325 MG tablet; Take 1 tablet by mouth every 6 (six) hours as needed for moderate pain.  Dispense: 65 tablet; Refill: 0  5. Gastroesophageal reflux disease without esophagitis - famotidine (PEPCID) 20 MG tablet; Take 1 tablet (20 mg total) by mouth every morning.  Dispense: 30 tablet; Refill: 5  6. Adjustment insomnia May take alprazolam 0.25mg  at bedtime as needed for anxiety which interferes with sleep. New prescription sent to her pharmacy today.  - ALPRAZolam (XANAX) 0.25 MG tablet; Take 1 tablet (0.25 mg total) by mouth at bedtime as needed for anxiety.  Dispense: 30 tablet; Refill: 3  General Counseling: [name] understanding of the findings of todays visit and agrees with plan of treatment. I have discussed any further diagnostic evaluation that may be needed or ordered today. We also reviewed her medications today. she has been encouraged to call the office with any questions or concerns that should arise related to todays visit.  Cardiac risk factor modification:  1. Control blood pressure. 2. Exercise as prescribed. 3. Follow low sodium, low fat diet. and low fat and low cholestrol diet. 4. Take ASA 81mg  once a day. 5. Restricted calories diet to lose weight.  This patient was seen by Vincent Gros FNP Collaboration with Dr Lyndon Code as a part of collaborative care agreement  Meds ordered this encounter  Medications  . ELIQUIS 2.5 MG TABS tablet    Sig: Take 1 tablet (2.5 mg total) by mouth 2 (two) times daily.    Dispense:  60 tablet    Refill:  3    Order Specific Question:   Supervising Provider    Answer:   Lyndon Code [[id number]]  . ALPRAZolam (XANAX) 0.25 MG tablet    Sig: Take 1 tablet (0.25 mg total) by mouth at bedtime as needed for anxiety.    Dispense:  30 tablet    Refill:  3    Order Specific Question:   Supervising Provider    Answer:   Lyndon Code [[id number]]  . atorvastatin (LIPITOR) 40 MG  tablet    Sig: Take 1 tablet (40 mg total) by mouth at bedtime.    Dispense:  30 tablet    Refill:  5    Order Specific Question:   Supervising Provider    Answer:   Lyndon Code [[id number]]  . HYDROcodone-acetaminophen (NORCO/VICODIN) 5-325 MG tablet    Sig: Take 1 tablet by mouth every 6 (six) hours as needed for moderate pain.    Dispense:  65 tablet    Refill:  0    Prescription is not for post op pain and is not new prescription. This is chronic/ongoing pain for which she takes this medication only as needed.    Order Specific Question:   Supervising Provider    Answer:   Lyndon Code [[id number]]  . lisinopril (PRINIVIL,ZESTRIL) 20 MG tablet    Sig: Take 1 tablet (20 mg  total) by mouth daily.    Dispense:  30 tablet    Refill:  5    Order Specific Question:   Supervising Provider    Answer:   Lyndon Code [[id number]]  . famotidine (PEPCID) 20 MG tablet    Sig: Take 1 tablet (20 mg total) by mouth every morning.    Dispense:  30 tablet    Refill:  5    Order Specific Question:   Supervising Provider    Answer:   Lyndon Code [[id number]]    Time spent: 29 Minutes      Dr Lyndon Code Internal medicine

## 2019-02-24 ENCOUNTER — Ambulatory Visit: Payer: Self-pay | Admitting: Nurse Practitioner

## 2019-02-25 ENCOUNTER — Ambulatory Visit: Payer: Self-pay | Admitting: Nurse Practitioner

## 2019-04-25 ENCOUNTER — Ambulatory Visit: Payer: Self-pay | Admitting: Nurse Practitioner

## 2019-06-27 ENCOUNTER — Encounter: Payer: Self-pay | Admitting: Nurse Practitioner

## 2019-06-27 ENCOUNTER — Ambulatory Visit (INDEPENDENT_AMBULATORY_CARE_PROVIDER_SITE_OTHER): Payer: Medicare HMO | Admitting: Nurse Practitioner

## 2019-06-27 ENCOUNTER — Other Ambulatory Visit: Payer: Self-pay

## 2019-06-27 VITALS — BP 137/78 | HR 85 | Resp 16

## 2019-06-27 DIAGNOSIS — G8929 Other chronic pain: Secondary | ICD-10-CM

## 2019-06-27 DIAGNOSIS — I6523 Occlusion and stenosis of bilateral carotid arteries: Secondary | ICD-10-CM | POA: Diagnosis not present

## 2019-06-27 DIAGNOSIS — Z0001 Encounter for general adult medical examination with abnormal findings: Secondary | ICD-10-CM | POA: Diagnosis not present

## 2019-06-27 DIAGNOSIS — M79605 Pain in left leg: Secondary | ICD-10-CM

## 2019-06-27 DIAGNOSIS — M79604 Pain in right leg: Secondary | ICD-10-CM | POA: Diagnosis not present

## 2019-06-27 DIAGNOSIS — I10 Essential (primary) hypertension: Secondary | ICD-10-CM | POA: Diagnosis not present

## 2019-06-27 NOTE — Progress Notes (Signed)
[facility] [address] [place], [place] [address]  Internal MEDICINE  Office Visit Note  Patient Name: [name]  [id number]  [id number]  Date of Service: 07/06/2019   Pt is here for routine health maintenance examination  Chief Complaint  Patient presents with  . Hypertension  . Hyperlipidemia     The patient is here for health maintenance exam. She states that she is doing well overall. She is due to have routine, fasting labs done. She should also have a repeat carotid doppler ultrasound for surveillance. Her last carotid ultrasound showed severe plaque with greater than 70% stenosis bilaterally. She declined to have further assessment with CTA of the neck. She also declined further evaluation from vein and vascular providers at the time.     Current Medication: Outpatient Encounter Medications as of 06/27/2019  Medication Sig  . aspirin 81 MG tablet Take 81 mg by mouth every morning.   Marland Kitchen. atorvastatin (LIPITOR) 40 MG tablet Take 1 tablet (40 mg total) by mouth at bedtime.  . ergocalciferol (DRISDOL) 50000 units capsule Take 1 capsule (50,000 Units total) by mouth once a week.  . famotidine (PEPCID) 20 MG tablet Take 1 tablet (20 mg total) by mouth every morning.  Marland Kitchen. HYDROcodone-acetaminophen (NORCO/VICODIN) 5-325 MG tablet Take 1 tablet by mouth every 6 (six) hours as needed for moderate pain.  Marland Kitchen. lisinopril (PRINIVIL,ZESTRIL) 20 MG tablet Take 1 tablet (20 mg total) by mouth daily.  . magnesium hydroxide (MILK OF MAGNESIA) 400 MG/5ML suspension Take 30 mLs by mouth daily as needed for mild constipation.  . traMADol (ULTRAM) 50 MG tablet Take 50 mg by mouth as needed.  . [DISCONTINUED] ALPRAZolam (XANAX) 0.25 MG tablet Take 1 tablet (0.25 mg total) by mouth at bedtime as needed for anxiety. (Patient not taking: Reported on 06/27/2019)  . [DISCONTINUED] ELIQUIS 2.5 MG TABS tablet Take 1 tablet (2.5 mg total) by mouth 2 (two) times daily. (Patient not taking:  Reported on 06/27/2019)   No facility-administered encounter medications on file as of 06/27/2019.     Surgical History: Past Surgical History:  Procedure Laterality Date  . ABOVE KNEE LEG AMPUTATION Right 01/2015  . AMPUTATION Left 07/16/2015   Procedure: AMPUTATION ABOVE KNEE;  Surgeon: Renford DillsGregory G Schnier, MD;  Location: [facility];  Service: Vascular;  Laterality: Left;  . AMPUTATION Left 07/23/2015   Procedure:   WOUND CLOSURE;  Surgeon: Renford DillsGregory G Schnier, MD;  Location: [facility];  Service: Vascular;  Laterality: Left;  . AMPUTATION Left 11/19/2015   Procedure:  ( REVISION ) AMPUTATION ABOVE KNEE ;  Surgeon: Renford DillsGregory G Schnier, MD;  Location: [facility];  Service: Vascular;  Laterality: Left;  . APPENDECTOMY  1969  . BREAST BIOPSY Right 1970   neg  . ECTOPIC PREGNANCY SURGERY Right 1969  . FOREIGN BODY REMOVAL Left 07/28/2016   Procedure: FOREIGN BODY REMOVAL ADULT ( LEG );  Surgeon: Renford DillsGregory G Schnier, MD;  Location: [facility];  Service: Vascular;  Laterality: Left;  . PERIPHERAL VASCULAR CATHETERIZATION N/A 07/22/2015   Procedure: Abdominal Aortogram w/Lower Extremity;  Surgeon: Annice NeedyJason S Dew, MD;  Location: [facility];  Service: Cardiovascular;  Laterality: N/A;  . PERIPHERAL VASCULAR CATHETERIZATION  07/22/2015   Procedure: Lower Extremity Intervention;  Surgeon: Annice NeedyJason S Dew, MD;  Location: [facility];  Service: Cardiovascular;;  . stents left leg      Medical History: Past Medical History:  Diagnosis Date  . Hypertension   . Peripheral vascular disease (  HCC)   . Pneumonia 07/14/15  . Tobacco abuse   . 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 CAD Mother   . Hypertension Mother   . Stroke Mother   . CAD Father   . Hypertension Father   . Breast cancer Neg Hx       Review of Systems  Constitutional: Negative for activity change, chills, diaphoresis, fatigue and unexpected weight  change.  HENT: Negative for ear pain, postnasal drip and sinus pressure.   Respiratory: Negative for cough, chest tightness, shortness of breath and wheezing.   Cardiovascular: Negative for chest pain and palpitations.       Good blood pressure control   Gastrointestinal: Negative for abdominal pain, constipation, diarrhea, nausea and vomiting.  Endocrine: Negative for cold intolerance, heat intolerance, polydipsia and polyuria.  Musculoskeletal: Negative for arthralgias, back pain, gait problem and neck pain.       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 Also has lower back pain since moving from her home into new home.   Skin: Negative for color change and rash.  Allergic/Immunologic: Negative for environmental allergies and food allergies.  Neurological: Positive for weakness. Negative for dizziness and headaches.  Hematological: Negative for adenopathy. Does not bruise/bleed easily.  Psychiatric/Behavioral: Positive for sleep disturbance. Negative for agitation, behavioral problems (depression) and hallucinations. The patient is nervous/anxious.        Improved since adding low dose alprazolam at bedtime     Today's Vitals   06/27/19 1126  BP: 137/78  Pulse: 85  Resp: 16  SpO2: 96%   There is no height or weight on file to calculate BMI.  Physical Exam Vitals signs and nursing note reviewed.  Constitutional:      General: She is not in acute distress.    Appearance: Normal appearance. She is well-developed. She is not diaphoretic.  HENT:     Head: Normocephalic and atraumatic.     Nose: Nose normal.     Mouth/Throat:     Pharynx: No oropharyngeal exudate.  Eyes:     Conjunctiva/sclera: Conjunctivae normal.     Pupils: Pupils are equal, round, and reactive to light.  Neck:     Musculoskeletal: Normal range of motion and neck supple.     Thyroid: No thyromegaly.     Vascular: Carotid bruit present. No JVD.     Trachea: No  tracheal deviation.     Comments: Soft bruit auscultated over the left carotid artery.  Cardiovascular:     Rate and Rhythm: Normal rate and regular rhythm.     Heart sounds: Normal heart sounds. No murmur. No friction rub. No gallop.   Pulmonary:     Effort: Pulmonary effort is normal. No respiratory distress.     Breath sounds: Normal breath sounds. No wheezing or rales.  Chest:     Chest wall: No tenderness.  Abdominal:     General: Bowel sounds are normal.     Palpations: Abdomen is soft.     Tenderness: There is no abdominal tenderness.  Musculoskeletal: Normal range of motion.     Comments: She is above the knee amputee, bilaterally. Tenderness at amputation site of both legs. No redness, swelling, or evidence of infection or abnormalities. She currently uses a manual wheelchair.  Lymphadenopathy:     Cervical: No cervical adenopathy.  Skin:    General: Skin  is warm and dry.     Capillary Refill: Capillary refill takes 2 to 3 seconds.  Neurological:     General: No focal deficit present.     Mental Status: She is alert and oriented to person, place, and time.     Cranial Nerves: No cranial nerve deficit.  Psychiatric:        Mood and Affect: Mood normal.        Behavior: Behavior normal.        Thought Content: Thought content normal.        Judgment: Judgment normal.    Depression screen [facility] 2/9 06/27/2019 12/30/2018 09/30/2018 02/18/2018  Decreased Interest 0 0 0 0  Down, Depressed, Hopeless 0 0 0 0  PHQ - 2 Score 0 0 0 0    Functional Status Survey: Is the patient deaf or have difficulty hearing?: No Does the patient have difficulty seeing, even when wearing glasses/contacts?: No Does the patient have difficulty concentrating, remembering, or making decisions?: No Does the patient have difficulty walking or climbing stairs?: Yes(pt uses a wheelchair) Does the patient have difficulty dressing or bathing?: No Does the patient have difficulty doing errands alone such as  visiting a doctor's office or shopping?: Yes  MMSE - Garibaldi Exam 06/27/2019 02/18/2018  Orientation to time 5 5  Orientation to Place 5 5  Registration 3 3  Attention/ Calculation 5 3  Recall 3 3  Language- name 2 objects 2 2  Language- repeat 1 1  Language- follow 3 step command 3 3  Language- read & follow direction 1 1  Write a sentence 1 1  Copy design 1 1  Total score 30 28    Fall Risk  06/27/2019 12/30/2018 09/30/2018  Falls in the past year? 0 0 0      LABS: No results found for this or any previous visit (from the past 2160 hour(s)).  Assessment/Plan: 1. Encounter for general adult medical examination with abnormal findings Annual health maintenance exam today.  2. Carotid atherosclerosis, bilateral Carotid ultrasound done earlier this year showing severe plaque in both carotid arteries with >70% stenosis. Will get repeat carotid ultrasound for surveillance. Provide new referral to vein and vascular as indicated.  - US Carotid Duplex Bilateral; Future  3. Chronic pain of both lower extremities Patient taking hydrocodone/APAP 5/325mg  tablets as needed and as prescribed. She does not need to have new prescription today.   4. Essential hypertension Stable. Continue bp medication today.  General Counseling: dema timmons understanding of the findings of todays visit and agrees with plan of treatment. I have discussed any further diagnostic evaluation that may be needed or ordered today. We also reviewed her medications today. she has been encouraged to call the office with any questions or concerns that should arise related to todays visit.    Counseling:  Hypertension Counseling:   The following hypertensive lifestyle modification were recommended and discussed:  1. Limiting alcohol intake to less than 1 oz/day of ethanol:(24 oz of beer or 8 oz of wine or 2 oz of 100-proof whiskey). 2. Take baby ASA 81 mg daily. 3. Importance of regular aerobic  exercise and losing weight. 4. Reduce dietary saturated fat and cholesterol intake for overall cardiovascular health. 5. Maintaining adequate dietary potassium, calcium, and magnesium intake. 6. Regular monitoring of the blood pressure. 7. Reduce sodium intake to less than 100 mmol/day (less than 2.3 gm of sodium or less than 6 gm of sodium choride)   This patient  was seen by Vincent Gros FNP Collaboration with Dr Lyndon Code as a part of collaborative care agreement  Orders Placed This Encounter  Procedures  . US Carotid Duplex Bilateral    Time spent: 42 Minutes      Lyndon Code, MD  Internal Medicine

## 2019-07-06 DIAGNOSIS — I6523 Occlusion and stenosis of bilateral carotid arteries: Secondary | ICD-10-CM | POA: Insufficient documentation

## 2019-07-16 ENCOUNTER — Other Ambulatory Visit: Payer: Self-pay

## 2019-07-16 ENCOUNTER — Ambulatory Visit (INDEPENDENT_AMBULATORY_CARE_PROVIDER_SITE_OTHER): Payer: Medicare HMO

## 2019-07-16 VITALS — Temp 97.9°F

## 2019-07-16 DIAGNOSIS — Z23 Encounter for immunization: Secondary | ICD-10-CM

## 2019-07-18 ENCOUNTER — Other Ambulatory Visit: Payer: Self-pay

## 2019-07-18 DIAGNOSIS — K219 Gastro-esophageal reflux disease without esophagitis: Secondary | ICD-10-CM

## 2019-07-18 MED ORDER — FAMOTIDINE 20 MG PO TABS: 20.0000 mg | ORAL_TABLET | ORAL | 5 refills | Status: DC

## 2019-07-22 ENCOUNTER — Other Ambulatory Visit: Payer: Self-pay

## 2019-07-22 DIAGNOSIS — I700 Atherosclerosis of aorta: Secondary | ICD-10-CM

## 2019-07-22 MED ORDER — ATORVASTATIN CALCIUM 40 MG PO TABS: 40.0000 mg | ORAL_TABLET | Freq: Every day | ORAL | 5 refills | Status: DC

## 2019-08-19 ENCOUNTER — Other Ambulatory Visit: Payer: Self-pay

## 2019-08-19 DIAGNOSIS — I10 Essential (primary) hypertension: Secondary | ICD-10-CM

## 2019-08-19 MED ORDER — LISINOPRIL 20 MG PO TABS: 20.0000 mg | ORAL_TABLET | Freq: Every day | ORAL | 5 refills | Status: DC

## 2019-10-03 ENCOUNTER — Telehealth: Payer: Self-pay

## 2019-10-03 NOTE — Telephone Encounter (Signed)
Confirmed appointment with patient. klh °

## 2019-10-06 ENCOUNTER — Other Ambulatory Visit: Payer: Self-pay

## 2019-10-06 ENCOUNTER — Encounter: Payer: Self-pay | Admitting: Adult Health

## 2019-10-06 ENCOUNTER — Ambulatory Visit (INDEPENDENT_AMBULATORY_CARE_PROVIDER_SITE_OTHER): Payer: Medicare HMO | Admitting: Adult Health

## 2019-10-06 VITALS — Temp 97.6°F

## 2019-10-06 DIAGNOSIS — Z20828 Contact with and (suspected) exposure to other viral communicable diseases: Secondary | ICD-10-CM

## 2019-10-06 DIAGNOSIS — Z20822 Contact with and (suspected) exposure to covid-19: Secondary | ICD-10-CM

## 2019-10-06 NOTE — Progress Notes (Signed)
[facility] [place], [place] [address]  Internal MEDICINE  Telephone Visit  Patient Name: JAYLEEN SCAGLIONE  [id number]  [id number]  Date of Service: 10/06/2019  I connected with the patient at 836 by telephone and verified the patients identity using two identifiers.   I discussed the limitations, risks, security and privacy concerns of performing an evaluation and management service by telephone and the availability of in person appointments. I also discussed with the patient that there may be a patient responsible charge related to the service.  The patient expressed understanding and agrees to proceed.    Chief Complaint  Patient presents with  . Telephone Assessment    exposure to covid   . Telephone Screen  . Hypertension    HPI  Pt seen via telephone.  She reports she was exposed to covid 6 days ago by a family member.  She reports the family member came to her home, and wore a mask the whole time.  She was inside the home for about 7 minutes. This point of contact has had flu like symptoms.  Mrs. Langsam is seen today for concerns.     Current Medication: Outpatient Encounter Medications as of 10/06/2019  Medication Sig  . aspirin 81 MG tablet Take 81 mg by mouth every morning.   Marland Kitchen atorvastatin (LIPITOR) 40 MG tablet Take 1 tablet (40 mg total) by mouth at bedtime.  . ergocalciferol (DRISDOL) 50000 units capsule Take 1 capsule (50,000 Units total) by mouth once a week.  . famotidine (PEPCID) 20 MG tablet Take 1 tablet (20 mg total) by mouth every morning.  Marland Kitchen HYDROcodone-acetaminophen (NORCO/VICODIN) 5-325 MG tablet Take 1 tablet by mouth every 6 (six) hours as needed for moderate pain.  Marland Kitchen lisinopril (ZESTRIL) 20 MG tablet Take 1 tablet (20 mg total) by mouth daily.  . magnesium hydroxide (MILK OF MAGNESIA) 400 MG/5ML suspension Take 30 mLs by mouth daily as needed for mild constipation.  . traMADol (ULTRAM) 50 MG tablet Take 50 mg by mouth as  needed.   No facility-administered encounter medications on file as of 10/06/2019.    Surgical History: Past Surgical History:  Procedure Laterality Date  . ABOVE KNEE LEG AMPUTATION Right 01/2015  . AMPUTATION Left 07/16/2015   Procedure: AMPUTATION ABOVE KNEE;  Surgeon: Katha Cabal, MD;  Location: [facility];  Service: Vascular;  Laterality: Left;  . AMPUTATION Left 07/23/2015   Procedure:   WOUND CLOSURE;  Surgeon: Katha Cabal, MD;  Location: [facility];  Service: Vascular;  Laterality: Left;  . AMPUTATION Left 11/19/2015   Procedure:  ( EGBTDVVO ) AMPUTATION ABOVE KNEE ;  Surgeon: Katha Cabal, MD;  Location: [facility];  Service: Vascular;  Laterality: Left;  . APPENDECTOMY  1969  . BREAST BIOPSY Right 1970   neg  . ECTOPIC PREGNANCY SURGERY Right 1969  . FOREIGN BODY REMOVAL Left 07/28/2016   Procedure: FOREIGN BODY REMOVAL ADULT ( LEG );  Surgeon: Katha Cabal, MD;  Location: [facility];  Service: Vascular;  Laterality: Left;  . PERIPHERAL VASCULAR CATHETERIZATION N/A 07/22/2015   Procedure: Abdominal Aortogram w/Lower Extremity;  Surgeon: Algernon Huxley, MD;  Location: [place] [facility];  Service: Cardiovascular;  Laterality: N/A;  . PERIPHERAL VASCULAR CATHETERIZATION  07/22/2015   Procedure: Lower Extremity Intervention;  Surgeon: Algernon Huxley, MD;  Location: [place] [facility];  Service: Cardiovascular;;  . stents left leg      Medical History: Past Medical History:  Diagnosis Date  . Hypertension   . Peripheral vascular disease (HCC)   . Pneumonia 07/14/15  . Tobacco abuse   . 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 CAD Mother   . Hypertension Mother   . Stroke Mother   . CAD Father   . Hypertension Father   . Breast cancer Neg Hx     Social History   Socioeconomic History  . Marital status: Married    Spouse name: Not on file  . Number of children: Not on file  .  Years of education: Not on file  . Highest education level: Not on file  Occupational History  . Not on file  Tobacco Use  . Smoking status: Light Tobacco Smoker    Packs/day: 0.25    Years: 55.00    Pack years: 13.75    Last attempt to quit: 01/20/2015    Years since quitting: 4.7  . Smokeless tobacco: Never Used  Substance and Sexual Activity  . Alcohol use: No  . Drug use: No  . Sexual activity: Not on file  Other Topics Concern  . Not on file  Social History Narrative  . Not on file   Social Determinants of Health   Financial Resource Strain:   . Difficulty of Paying Living Expenses: Not on file  Food Insecurity:   . Worried About Programme researcher, broadcasting/film/video in the Last Year: Not on file  . Ran Out of Food in the Last Year: Not on file  Transportation Needs:   . Lack of Transportation (Medical): Not on file  . Lack of Transportation (Non-Medical): Not on file  Physical Activity:   . Days of Exercise per Week: Not on file  . Minutes of Exercise per Session: Not on file  Stress:   . Feeling of Stress : Not on file  Social Connections:   . Frequency of Communication with Friends and Family: Not on file  . Frequency of Social Gatherings with Friends and Family: Not on file  . Attends Religious Services: Not on file  . Active Member of Clubs or Organizations: Not on file  . Attends Banker Meetings: Not on file  . Marital Status: Not on file  Intimate Partner Violence:   . Fear of Current or Ex-Partner: Not on file  . Emotionally Abused: Not on file  . Physically Abused: Not on file  . Sexually Abused: Not on file      Review of Systems  Constitutional: Negative for chills, fatigue and unexpected weight change.  HENT: Negative for congestion, rhinorrhea, sneezing and sore throat.   Eyes: Negative for photophobia, pain and redness.  Respiratory: Negative for cough, chest tightness and shortness of breath.   Cardiovascular: Negative for chest pain and  palpitations.  Gastrointestinal: Negative for abdominal pain, constipation, diarrhea, nausea and vomiting.  Endocrine: Negative.   Genitourinary: Negative for dysuria and frequency.  Musculoskeletal: Negative for arthralgias, back pain, joint swelling and neck pain.  Skin: Negative for rash.  Allergic/Immunologic: Negative.   Neurological: Negative for tremors and numbness.  Hematological: Negative for adenopathy. Does not bruise/bleed easily.  Psychiatric/Behavioral: Negative for behavioral problems and sleep disturbance. The patient is not nervous/anxious.     Vital Signs: Temp 97.6 F (36.4 C)    Observation/Objective:  Well sounding, NAD noted.    Assessment/Plan: 1. Exposure to COVID-19 virus Discussed testing with patient.  Encouraged  her to look for symptoms such has headache, congestion, sob, or fever.  If symptoms develop she should get tested, phone number provided.  Call office for further issues.   General Counseling: Consuelo Pandyatricia verbalizes understanding of the findings of today's phone visit and agrees with plan of treatment. I have discussed any further diagnostic evaluation that may be needed or ordered today. We also reviewed her medications today. she has been encouraged to call the office with any questions or concerns that should arise related to todays visit.    No orders of the defined types were placed in this encounter.   No orders of the defined types were placed in this encounter.   Time spent: 15 Minutes    Blima LedgerAdam Veryl Abril AGNP-C Internal medicine

## 2019-12-15 ENCOUNTER — Other Ambulatory Visit: Payer: Self-pay

## 2019-12-15 DIAGNOSIS — I10 Essential (primary) hypertension: Secondary | ICD-10-CM

## 2019-12-15 DIAGNOSIS — K219 Gastro-esophageal reflux disease without esophagitis: Secondary | ICD-10-CM

## 2019-12-15 MED ORDER — FAMOTIDINE 20 MG PO TABS: 20.0000 mg | ORAL_TABLET | ORAL | 5 refills | Status: DC

## 2019-12-15 MED ORDER — LISINOPRIL 20 MG PO TABS: 20.0000 mg | ORAL_TABLET | Freq: Every day | ORAL | 5 refills | Status: DC

## 2019-12-19 ENCOUNTER — Other Ambulatory Visit: Payer: Medicare HMO

## 2019-12-25 ENCOUNTER — Telehealth: Payer: Self-pay

## 2019-12-25 NOTE — Telephone Encounter (Signed)
CONFIRMED AND SCREENED FOR 12-29-19 OV. 

## 2019-12-29 ENCOUNTER — Other Ambulatory Visit: Payer: Self-pay

## 2019-12-29 ENCOUNTER — Ambulatory Visit (INDEPENDENT_AMBULATORY_CARE_PROVIDER_SITE_OTHER): Payer: Medicare HMO | Admitting: Nurse Practitioner

## 2019-12-29 ENCOUNTER — Encounter: Payer: Self-pay | Admitting: Nurse Practitioner

## 2019-12-29 VITALS — BP 161/79 | HR 78 | Temp 97.3°F | Resp 16 | Wt 105.0 lb

## 2019-12-29 DIAGNOSIS — M79604 Pain in right leg: Secondary | ICD-10-CM | POA: Diagnosis not present

## 2019-12-29 DIAGNOSIS — M79605 Pain in left leg: Secondary | ICD-10-CM | POA: Diagnosis not present

## 2019-12-29 DIAGNOSIS — G8929 Other chronic pain: Secondary | ICD-10-CM | POA: Diagnosis not present

## 2019-12-29 DIAGNOSIS — I10 Essential (primary) hypertension: Secondary | ICD-10-CM

## 2019-12-29 DIAGNOSIS — I6523 Occlusion and stenosis of bilateral carotid arteries: Secondary | ICD-10-CM

## 2019-12-29 MED ORDER — HYDROCODONE-ACETAMINOPHEN 5-325 MG PO TABS: 1.0000 | ORAL_TABLET | Freq: Four times a day (QID) | ORAL | 0 refills | Status: DC | PRN

## 2019-12-29 NOTE — Progress Notes (Signed)
[facility] [address], [place] [address]  Internal MEDICINE  Office Visit Note  Patient Name: [name]  [id number]  [id number]  Date of Service: 01/04/2020  Chief Complaint  Patient presents with  . Hypertension  . Gastroesophageal Reflux  . Hyperlipidemia  . Medication Refill    famotidine and lisinopril     The patient is here for follow up visit. Blood pressure is mildly elevated today. Generally elevated when she first arrives at the office but improves as she is present for a little time. She has no physical concerns or complaints today. Does have occasional phantom limb pain which burns and stings for some time. She does take hydrocodone/APAP 5/325mg  tablets when this becomes severe. Her last prescription for this medication was several months ago. She would like to get a new prescription to have on hand. She is scheduled to have her carotid doppler study on 01/23/2020 and will follow up about a week after that to review.       Current Medication: Outpatient Encounter Medications as of 12/29/2019  Medication Sig  . aspirin 81 MG tablet Take 81 mg by mouth every morning.   Marland Kitchen atorvastatin (LIPITOR) 40 MG tablet Take 1 tablet (40 mg total) by mouth at bedtime.  . ergocalciferol (DRISDOL) 50000 units capsule Take 1 capsule (50,000 Units total) by mouth once a week.  . famotidine (PEPCID) 20 MG tablet Take 1 tablet (20 mg total) by mouth every morning.  Marland Kitchen HYDROcodone-acetaminophen (NORCO/VICODIN) 5-325 MG tablet Take 1 tablet by mouth every 6 (six) hours as needed for moderate pain.  Marland Kitchen lisinopril (ZESTRIL) 20 MG tablet Take 1 tablet (20 mg total) by mouth daily.  . magnesium hydroxide (MILK OF MAGNESIA) 400 MG/5ML suspension Take 30 mLs by mouth daily as needed for mild constipation.  . traMADol (ULTRAM) 50 MG tablet Take 50 mg by mouth as needed.  . [DISCONTINUED] HYDROcodone-acetaminophen (NORCO/VICODIN) 5-325 MG tablet Take 1 tablet by mouth every  6 (six) hours as needed for moderate pain.   No facility-administered encounter medications on file as of 12/29/2019.    Surgical History: Past Surgical History:  Procedure Laterality Date  . ABOVE KNEE LEG AMPUTATION Right 01/2015  . AMPUTATION Left 07/16/2015   Procedure: AMPUTATION ABOVE KNEE;  Surgeon: Katha Cabal, MD;  Location: [facility];  Service: Vascular;  Laterality: Left;  . AMPUTATION Left 07/23/2015   Procedure:   WOUND CLOSURE;  Surgeon: Katha Cabal, MD;  Location: [facility];  Service: Vascular;  Laterality: Left;  . AMPUTATION Left 11/19/2015   Procedure:  ( [id number] ) AMPUTATION ABOVE KNEE ;  Surgeon: Katha Cabal, MD;  Location: [facility];  Service: Vascular;  Laterality: Left;  . APPENDECTOMY  1969  . BREAST BIOPSY Right 1970   neg  . ECTOPIC PREGNANCY SURGERY Right 1969  . FOREIGN BODY REMOVAL Left 07/28/2016   Procedure: FOREIGN BODY REMOVAL ADULT ( LEG );  Surgeon: Katha Cabal, MD;  Location: [facility];  Service: Vascular;  Laterality: Left;  . PERIPHERAL VASCULAR CATHETERIZATION N/A 07/22/2015   Procedure: Abdominal Aortogram w/Lower Extremity;  Surgeon: Algernon Huxley, MD;  Location: [place] [facility];  Service: Cardiovascular;  Laterality: N/A;  . PERIPHERAL VASCULAR CATHETERIZATION  07/22/2015   Procedure: Lower Extremity Intervention;  Surgeon: Algernon Huxley, MD;  Location: [place] [facility];  Service: Cardiovascular;;  . stents left leg      Medical History: Past Medical History:  Diagnosis Date  .  Hypertension   . Peripheral vascular disease (HCC)   . Pneumonia 07/14/15  . Tobacco abuse   . 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 CAD Mother   . Hypertension Mother   . Stroke Mother   . CAD Father   . Hypertension Father   . Breast cancer Neg Hx     Social History   Socioeconomic History  . Marital status: Married    Spouse name: Not on file  .  Number of children: Not on file  . Years of education: Not on file  . Highest education level: Not on file  Occupational History  . Not on file  Tobacco Use  . Smoking status: Light Tobacco Smoker    Packs/day: 0.25    Years: 55.00    Pack years: 13.75    Last attempt to quit: 01/20/2015    Years since quitting: 4.9  . Smokeless tobacco: Never Used  Substance and Sexual Activity  . Alcohol use: No  . Drug use: No  . Sexual activity: Not on file  Other Topics Concern  . Not on file  Social History Narrative  . Not on file   Social Determinants of Health   Financial Resource Strain:   . Difficulty of Paying Living Expenses:   Food Insecurity:   . Worried About Programme researcher, broadcasting/film/video in the Last Year:   . Barista in the Last Year:   Transportation Needs:   . Freight forwarder (Medical):   Marland Kitchen Lack of Transportation (Non-Medical):   Physical Activity:   . Days of Exercise per Week:   . Minutes of Exercise per Session:   Stress:   . Feeling of Stress :   Social Connections:   . Frequency of Communication with Friends and Family:   . Frequency of Social Gatherings with Friends and Family:   . Attends Religious Services:   . Active Member of Clubs or Organizations:   . Attends Banker Meetings:   Marland Kitchen Marital Status:   Intimate Partner Violence:   . Fear of Current or Ex-Partner:   . Emotionally Abused:   Marland Kitchen Physically Abused:   . Sexually Abused:       Review of Systems  Constitutional: Negative for activity change, chills, diaphoresis, fatigue and unexpected weight change.  HENT: Negative for ear pain, postnasal drip and sinus pressure.   Respiratory: Negative for cough, chest tightness, shortness of breath and wheezing.   Cardiovascular: Negative for chest pain and palpitations.       Blood pressure mildly elevated today.   Gastrointestinal: Negative for abdominal pain, constipation, diarrhea, nausea and vomiting.  Endocrine: Negative for cold  intolerance, heat intolerance, polydipsia and polyuria.  Musculoskeletal: Negative for arthralgias, back pain, gait problem and neck pain.       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 Also has lower back pain which is intermittent in nature and associated with exertion.   Skin: Negative for color change and rash.  Allergic/Immunologic: Negative for environmental allergies and food allergies.  Neurological: Positive for weakness. Negative for dizziness and headaches.  Hematological: Negative for adenopathy. Does not bruise/bleed easily.  Psychiatric/Behavioral: Positive for sleep disturbance. Negative for agitation, behavioral problems (depression) and hallucinations. The patient is nervous/anxious.        Improved since adding low dose alprazolam at bedtime  Today's Vitals   12/29/19 1356  BP: (!) 161/79  Pulse: 78  Resp: 16  Temp: (!) 97.3 F (36.3 C)  SpO2: 95%  Weight: 105 lb (47.6 kg)   Body mass index is 32.04 kg/m.  Physical Exam Vitals and nursing note reviewed.  Constitutional:      General: She is not in acute distress.    Appearance: Normal appearance. She is well-developed. She is not diaphoretic.  HENT:     Head: Normocephalic and atraumatic.     Nose: Nose normal.     Mouth/Throat:     Pharynx: No oropharyngeal exudate.  Eyes:     Conjunctiva/sclera: Conjunctivae normal.     Pupils: Pupils are equal, round, and reactive to light.  Neck:     Thyroid: No thyromegaly.     Vascular: Carotid bruit present. No JVD.     Trachea: No tracheal deviation.     Comments: Soft bruit auscultated over the left carotid artery.  Cardiovascular:     Rate and Rhythm: Normal rate and regular rhythm.     Heart sounds: Normal heart sounds. No murmur. No friction rub. No gallop.   Pulmonary:     Effort: Pulmonary effort is normal. No respiratory distress.     Breath sounds: Normal breath sounds. No wheezing or rales.  Chest:      Chest wall: No tenderness.  Abdominal:     Palpations: Abdomen is soft.  Musculoskeletal:        General: Normal range of motion.     Cervical back: Normal range of motion and neck supple.     Comments: She is above the knee amputee, bilaterally. Tenderness at amputation site of both legs. No redness, swelling, or evidence of infection or abnormalities. She currently uses a manual wheelchair.  Lymphadenopathy:     Cervical: No cervical adenopathy.  Skin:    General: Skin is warm and dry.     Capillary Refill: Capillary refill takes 2 to 3 seconds.  Neurological:     General: No focal deficit present.     Mental Status: She is alert and oriented to person, place, and time.     Cranial Nerves: No cranial nerve deficit.  Psychiatric:        Mood and Affect: Mood normal.        Behavior: Behavior normal.        Thought Content: Thought content normal.        Judgment: Judgment normal.    Assessment/Plan: 1. Essential hypertension Generally stable. Patient to monitor her blood pressure, regularly, at home. Will adjust as indicated at next visit.   2. Carotid atherosclerosis, bilateral Carotid doppler scheduled for 01/23/2020.   3. Chronic pain of both lower extremities May take hydrocodone/APAP 5/325mg  tablets as needed and as prescribed for acute pain. A single prescription for #65 tablets was sent to her pharmacy.  - HYDROcodone-acetaminophen (NORCO/VICODIN) 5-325 MG tablet; Take 1 tablet by mouth every 6 (six) hours as needed for moderate pain.  Dispense: 65 tablet; Refill: 0  General Counseling: kaylah chiasson understanding of the findings of todays visit and agrees with plan of treatment. I have discussed any further diagnostic evaluation that may be needed or ordered today. We also reviewed her medications today. she has been encouraged to call the office with any questions or concerns that should arise related to todays visit.  Hypertension Counseling:   The following  hypertensive lifestyle modification were recommended and discussed:  1. Limiting alcohol intake to less  than 1 oz/day of ethanol:(24 oz of beer or 8 oz of wine or 2 oz of 100-proof whiskey). 2. Take baby ASA 81 mg daily. 3. Importance of regular aerobic exercise and losing weight. 4. Reduce dietary saturated fat and cholesterol intake for overall cardiovascular health. 5. Maintaining adequate dietary potassium, calcium, and magnesium intake. 6. Regular monitoring of the blood pressure. 7. Reduce sodium intake to less than 100 mmol/day (less than 2.3 gm of sodium or less than 6 gm of sodium choride)   This patient was seen by Vincent Gros FNP Collaboration with Dr Lyndon Code as a part of collaborative care agreement  Meds ordered this encounter  Medications  . HYDROcodone-acetaminophen (NORCO/VICODIN) 5-325 MG tablet    Sig: Take 1 tablet by mouth every 6 (six) hours as needed for moderate pain.    Dispense:  65 tablet    Refill:  0    Prescription is not for post op pain and is not new prescription. This is chronic/ongoing pain for which she takes this medication only as needed.    Order Specific Question:   Supervising Provider    Answer:   Lyndon Code [[id number]]    Total time spent: 30 Minutes   Time spent includes review of chart, medications, test results, and follow up plan with the patient.      Dr Lyndon Code Internal medicine

## 2020-01-20 ENCOUNTER — Other Ambulatory Visit: Payer: Self-pay

## 2020-01-20 DIAGNOSIS — I700 Atherosclerosis of aorta: Secondary | ICD-10-CM

## 2020-01-20 MED ORDER — ATORVASTATIN CALCIUM 40 MG PO TABS: 40.0000 mg | ORAL_TABLET | Freq: Every day | ORAL | 5 refills | Status: DC

## 2020-01-21 ENCOUNTER — Telehealth: Payer: Self-pay

## 2020-01-21 NOTE — Telephone Encounter (Signed)
Confirmed appointment on 01/23/2020 and screened for [email or web address] °

## 2020-01-23 ENCOUNTER — Other Ambulatory Visit: Payer: Self-pay

## 2020-01-23 ENCOUNTER — Ambulatory Visit: Payer: Medicare HMO

## 2020-01-23 DIAGNOSIS — I6523 Occlusion and stenosis of bilateral carotid arteries: Secondary | ICD-10-CM | POA: Diagnosis not present

## 2020-01-28 ENCOUNTER — Telehealth: Payer: Self-pay

## 2020-01-28 NOTE — Telephone Encounter (Signed)
Confirmed and screened for 01-30-20 ov. 

## 2020-01-30 ENCOUNTER — Other Ambulatory Visit: Payer: Self-pay

## 2020-01-30 ENCOUNTER — Encounter: Payer: Self-pay | Admitting: Nurse Practitioner

## 2020-01-30 ENCOUNTER — Ambulatory Visit (INDEPENDENT_AMBULATORY_CARE_PROVIDER_SITE_OTHER): Payer: Medicare HMO | Admitting: Nurse Practitioner

## 2020-01-30 VITALS — BP 147/78 | HR 80 | Temp 97.3°F | Resp 16

## 2020-01-30 DIAGNOSIS — Z89612 Acquired absence of left leg above knee: Secondary | ICD-10-CM | POA: Diagnosis not present

## 2020-01-30 DIAGNOSIS — I6523 Occlusion and stenosis of bilateral carotid arteries: Secondary | ICD-10-CM

## 2020-01-30 DIAGNOSIS — Z89611 Acquired absence of right leg above knee: Secondary | ICD-10-CM

## 2020-01-30 DIAGNOSIS — I739 Peripheral vascular disease, unspecified: Secondary | ICD-10-CM

## 2020-01-30 DIAGNOSIS — I10 Essential (primary) hypertension: Secondary | ICD-10-CM | POA: Diagnosis not present

## 2020-01-30 NOTE — Progress Notes (Signed)
[facility] [place], [place] [address]  Internal MEDICINE  Office Visit Note  Patient Name: [name]  [id number]  [id number]  Date of Service: 02/11/2020  Chief Complaint  Patient presents with  . Follow-up    review ultrasound     The patient is here for follow up of recent carotid doppler study. There is >70% stenosis, bilaterally. Long history of peripheral vascular disease. She has had above the knee amputation, bilaterally, due to severe peripheral vascular disease. Has seen Dr. Ronalee Belts in the past. She currently has no concerns or complaints or symptoms related to significant carotid stenosis.       Current Medication: Outpatient Encounter Medications as of 01/30/2020  Medication Sig  . aspirin 81 MG tablet Take 81 mg by mouth every morning.   Marland Kitchen atorvastatin (LIPITOR) 40 MG tablet Take 1 tablet (40 mg total) by mouth at bedtime.  . ergocalciferol (DRISDOL) 50000 units capsule Take 1 capsule (50,000 Units total) by mouth once a week.  . famotidine (PEPCID) 20 MG tablet Take 1 tablet (20 mg total) by mouth every morning.  Marland Kitchen HYDROcodone-acetaminophen (NORCO/VICODIN) 5-325 MG tablet Take 1 tablet by mouth every 6 (six) hours as needed for moderate pain.  Marland Kitchen lisinopril (ZESTRIL) 20 MG tablet Take 1 tablet (20 mg total) by mouth daily.  . magnesium hydroxide (MILK OF MAGNESIA) 400 MG/5ML suspension Take 30 mLs by mouth daily as needed for mild constipation.  . traMADol (ULTRAM) 50 MG tablet Take 50 mg by mouth as needed.   No facility-administered encounter medications on file as of 01/30/2020.    Surgical History: Past Surgical History:  Procedure Laterality Date  . ABOVE KNEE LEG AMPUTATION Right 01/2015  . AMPUTATION Left 07/16/2015   Procedure: AMPUTATION ABOVE KNEE;  Surgeon: Katha Cabal, MD;  Location: [facility];  Service: Vascular;  Laterality: Left;  . AMPUTATION Left 07/23/2015   Procedure:   WOUND CLOSURE;  Surgeon: Katha Cabal, MD;  Location: [facility];  Service: Vascular;  Laterality: Left;  . AMPUTATION Left 11/19/2015   Procedure:  ( [id number] ) AMPUTATION ABOVE KNEE ;  Surgeon: Katha Cabal, MD;  Location: [facility];  Service: Vascular;  Laterality: Left;  . APPENDECTOMY  1969  . BREAST BIOPSY Right 1970   neg  . ECTOPIC PREGNANCY SURGERY Right 1969  . FOREIGN BODY REMOVAL Left 07/28/2016   Procedure: FOREIGN BODY REMOVAL ADULT ( LEG );  Surgeon: Katha Cabal, MD;  Location: [facility];  Service: Vascular;  Laterality: Left;  . PERIPHERAL VASCULAR CATHETERIZATION N/A 07/22/2015   Procedure: Abdominal Aortogram w/Lower Extremity;  Surgeon: Algernon Huxley, MD;  Location: [place] [facility];  Service: Cardiovascular;  Laterality: N/A;  . PERIPHERAL VASCULAR CATHETERIZATION  07/22/2015   Procedure: Lower Extremity Intervention;  Surgeon: Algernon Huxley, MD;  Location: [place] [facility];  Service: Cardiovascular;;  . stents left leg      Medical History: Past Medical History:  Diagnosis Date  . Hypertension   . Peripheral vascular disease ([place])   . Pneumonia 07/14/15  . Tobacco abuse   . 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 CAD Mother   . Hypertension Mother   . Stroke Mother   . CAD Father   . Hypertension Father   . Breast cancer Neg Hx     Social History   Socioeconomic  History  . Marital status: Married    Spouse name: Not on file  . Number of children: Not on file  . Years of education: Not on file  . Highest education level: Not on file  Occupational History  . Not on file  Tobacco Use  . Smoking status: Light Tobacco Smoker    Packs/day: 0.25    Years: 55.00    Pack years: 13.75    Last attempt to quit: 01/20/2015    Years since quitting: 5.0  . Smokeless tobacco: Never Used  Substance and Sexual Activity  . Alcohol use: No  . Drug use: No  . Sexual activity: Not on file  Other Topics  Concern  . Not on file  Social History Narrative  . Not on file   Social Determinants of Health   Financial Resource Strain:   . Difficulty of Paying Living Expenses:   Food Insecurity:   . Worried About Programme researcher, broadcasting/film/video in the Last Year:   . Barista in the Last Year:   Transportation Needs:   . Freight forwarder (Medical):   Marland Kitchen Lack of Transportation (Non-Medical):   Physical Activity:   . Days of Exercise per Week:   . Minutes of Exercise per Session:   Stress:   . Feeling of Stress :   Social Connections:   . Frequency of Communication with Friends and Family:   . Frequency of Social Gatherings with Friends and Family:   . Attends Religious Services:   . Active Member of Clubs or Organizations:   . Attends Banker Meetings:   Marland Kitchen Marital Status:   Intimate Partner Violence:   . Fear of Current or Ex-Partner:   . Emotionally Abused:   Marland Kitchen Physically Abused:   . Sexually Abused:       Review of Systems  Constitutional: Negative for activity change, chills, diaphoresis, fatigue and unexpected weight change.  HENT: Negative for ear pain, postnasal drip and sinus pressure.   Respiratory: Negative for cough, chest tightness, shortness of breath and wheezing.   Cardiovascular: Negative for chest pain and palpitations.  Gastrointestinal: Negative for abdominal pain, constipation, diarrhea, nausea and vomiting.  Endocrine: Negative for cold intolerance, heat intolerance, polydipsia and polyuria.  Musculoskeletal: Negative for arthralgias, back pain, gait problem and neck pain.       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 Also has lower back pain which is intermittent in nature and associated with exertion.   Skin: Negative for color change and rash.  Allergic/Immunologic: Negative for environmental allergies and food allergies.  Neurological: Positive for weakness. Negative for dizziness and headaches.   Hematological: Negative for adenopathy. Does not bruise/bleed easily.  Psychiatric/Behavioral: Positive for sleep disturbance. Negative for agitation, behavioral problems (depression) and hallucinations. The patient is nervous/anxious.        Improved since adding low dose alprazolam at bedtime    Today's Vitals   01/30/20 1345  BP: (!) 147/78  Pulse: 80  Resp: 16  Temp: (!) 97.3 F (36.3 C)  SpO2: 97%   There is no height or weight on file to calculate BMI.  Physical Exam Vitals and nursing note reviewed.  Constitutional:      General: She is not in acute distress.    Appearance: Normal appearance. She is well-developed. She is not diaphoretic.  HENT:     Head: Normocephalic and atraumatic.     Nose: Nose normal.  Mouth/Throat:     Pharynx: No oropharyngeal exudate.  Eyes:     Conjunctiva/sclera: Conjunctivae normal.     Pupils: Pupils are equal, round, and reactive to light.  Neck:     Thyroid: No thyromegaly.     Vascular: Carotid bruit present. No JVD.     Trachea: No tracheal deviation.     Comments: Soft bruit auscultated over the left carotid artery.  Cardiovascular:     Rate and Rhythm: Normal rate and regular rhythm.     Heart sounds: Normal heart sounds. No murmur. No friction rub. No gallop.   Pulmonary:     Effort: Pulmonary effort is normal. No respiratory distress.     Breath sounds: Normal breath sounds. No wheezing or rales.  Chest:     Chest wall: No tenderness.  Abdominal:     Palpations: Abdomen is soft.  Musculoskeletal:        General: Normal range of motion.     Cervical back: Normal range of motion and neck supple.     Comments: She is above the knee amputee, bilaterally. Tenderness at amputation site of both legs. No redness, swelling, or evidence of infection or abnormalities. She currently uses a manual wheelchair.  Lymphadenopathy:     Cervical: No cervical adenopathy.  Skin:    General: Skin is warm and dry.     Capillary Refill:  Capillary refill takes 2 to 3 seconds.  Neurological:     General: No focal deficit present.     Mental Status: She is alert and oriented to person, place, and time.     Cranial Nerves: No cranial nerve deficit.  Psychiatric:        Mood and Affect: Mood normal.        Behavior: Behavior normal.        Thought Content: Thought content normal.        Judgment: Judgment normal.    Assessment/Plan: 1. Carotid atherosclerosis, bilateral Reviewed results of carotid doppler ultrasound. She has moderate plaque with >70% stenosis, bilaterally. She will be referred to Vascular surgery for further evaluation and treatment.  - Ambulatory referral to Vascular Surgery  2. Peripheral vascular disease (HCC) Long history of severe PVD. Referral to vascular surgery for further evaluation and treatment.   3. 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 Refereed back to vascular surgery for further evaluation and treatment.   4. Essential hypertension Stable. Continue bp medication as prescribed .   General Counseling: signora zucco understanding of the findings of todays visit and agrees with plan of treatment. I have discussed any further diagnostic evaluation that may be needed or ordered today. We also reviewed her medications today. she has been encouraged to call the office with any questions or concerns that should arise related to todays visit.  This patient was seen by Vincent Gros FNP Collaboration with Dr Lyndon Code as a part of collaborative care agreement  Orders Placed This Encounter  Procedures  . Ambulatory referral to Vascular Surgery     Total time spent: 30 Minutes  Time spent includes review of chart, medications, test results, and follow up plan with the patient.      Dr Lyndon Code Internal medicine

## 2020-02-08 NOTE — Procedures (Signed)
[facility] [address] [place], [place] [address]  DATE OF SERVICE: January 23, 2020  CAROTID DOPPLER INTERPRETATION:  Bilateral Carotid Ultrsasound and Color Doppler Examination was performed. The RIGHT CCA shows heterogenous severe plaque in the vessel. The LEFT CCA shows severe heterogenous plaque in the vessel. There was significant intimal thickening noted in the RIGHT carotid artery. There was significant intimal thickening in the LEFT carotid artery.  The RIGHT CCA shows peak systolic velocity of 76 cm per second. The end diastolic velocity is 23 cm per second on the RIGHT side. The RIGHT ICA shows peak systolic velocity of 282 per second. RIGHT sided ICA end diastolic velocity is 83 cm per second. The RIGHT ECA shows a peak systolic velocity of 221 cm per second. The ICA/CCA ratio is calculated to be 40. This suggests greater than 70% stenosis. The Vertebral Artery shows antegrade flow.  The LEFT CCA shows peak systolic velocity of 73 cm per second. The end diastolic velocity is 21 cm per second on the LEFT side. The LEFT ICA shows peak systolic velocity of 267 per second. LEFT sided ICA end diastolic velocity is 94 cm per second. The LEFT ECA shows a peak systolic velocity of 114 cm per second. The ICA/CCA ratio is calculated to be 3.7. This suggests greater than 70% stenosis. The Vertebral Artery shows antegrade flow.   Impression:    The RIGHT CAROTID shows greater than 70% stenosis. The LEFT CAROTID shows greater than 70% stenosis.  There is severe heterogenous plaque formation noted on the LEFT and severe heterogenous plaque on the RIGHT  side. Consider a repeat Carotid doppler if clinical situation and symptoms warrant in 6-12 months. Patient should be encouraged to change lifestyles such as smoking cessation, regular exercise and dietary modification. Use of statins in the right clinical setting and ASA is encouraged.  Yevonne Pax, MD [facility] Pulmonary Critical Care  Medicine

## 2020-02-10 NOTE — Progress Notes (Signed)
Reviewed with patient during her visit.

## 2020-02-12 ENCOUNTER — Ambulatory Visit (INDEPENDENT_AMBULATORY_CARE_PROVIDER_SITE_OTHER): Payer: Medicare HMO | Admitting: Vascular Surgery

## 2020-02-12 ENCOUNTER — Encounter (INDEPENDENT_AMBULATORY_CARE_PROVIDER_SITE_OTHER): Payer: Self-pay | Admitting: Vascular Surgery

## 2020-02-12 ENCOUNTER — Other Ambulatory Visit: Payer: Self-pay

## 2020-02-12 VITALS — BP 123/78 | HR 84 | Wt 104.0 lb

## 2020-02-12 DIAGNOSIS — Z89612 Acquired absence of left leg above knee: Secondary | ICD-10-CM

## 2020-02-12 DIAGNOSIS — I6523 Occlusion and stenosis of bilateral carotid arteries: Secondary | ICD-10-CM

## 2020-02-12 DIAGNOSIS — I5022 Chronic systolic (congestive) heart failure: Secondary | ICD-10-CM | POA: Diagnosis not present

## 2020-02-12 DIAGNOSIS — Z89611 Acquired absence of right leg above knee: Secondary | ICD-10-CM

## 2020-02-12 DIAGNOSIS — I10 Essential (primary) hypertension: Secondary | ICD-10-CM | POA: Diagnosis not present

## 2020-02-13 ENCOUNTER — Encounter (INDEPENDENT_AMBULATORY_CARE_PROVIDER_SITE_OTHER): Payer: Self-pay | Admitting: Vascular Surgery

## 2020-02-13 NOTE — Progress Notes (Signed)
MRN : [id number]  [name] is a 74 y.o. (Mar 17, 1946) female who presents with chief complaint of  Chief Complaint  Patient presents with  . New Patient (Initial Visit)    LS 2018 carotid stenosis   .  History of Present Illness:   The patient is seen for evaluation of carotid stenosis. The carotid stenosis was identified after duplex ultrasound.  The patient denies amaurosis fugax. There is no recent history of TIA symptoms or focal motor deficits. There is no prior documented CVA.  There is no history of migraine headaches. There is no history of seizures.  The patient is taking enteric-coated aspirin 81 mg daily.  The patient has a history of coronary artery disease, no recent episodes of angina or shortness of breath.  There is a history of hyperlipidemia which is being treated with a statin.    Current Meds  Medication Sig  . aspirin 81 MG tablet Take 81 mg by mouth every morning.   Marland Kitchen atorvastatin (LIPITOR) 40 MG tablet Take 1 tablet (40 mg total) by mouth at bedtime.  . cholecalciferol (VITAMIN D3) 25 MCG (1000 UNIT) tablet Take 1,000 Units by mouth daily.  . famotidine (PEPCID) 20 MG tablet Take 1 tablet (20 mg total) by mouth every morning.  Marland Kitchen lisinopril (ZESTRIL) 20 MG tablet Take 1 tablet (20 mg total) by mouth daily.    Past Medical History:  Diagnosis Date  . Hypertension   . Peripheral vascular disease (HCC)   . Pneumonia 07/14/15  . Tobacco abuse   . Unilateral small kidney    a. one functional kidney    Past Surgical History:  Procedure Laterality Date  . ABOVE KNEE LEG AMPUTATION Right 01/2015  . AMPUTATION Left 07/16/2015   Procedure: AMPUTATION ABOVE KNEE;  Surgeon: Renford Dills, MD;  Location: [facility];  Service: Vascular;  Laterality: Left;  . AMPUTATION Left 07/23/2015   Procedure:   WOUND CLOSURE;  Surgeon: Renford Dills, MD;  Location: [facility];  Service: Vascular;  Laterality: Left;  . AMPUTATION Left 11/19/2015   Procedure:   ( REVISION ) AMPUTATION ABOVE KNEE ;  Surgeon: Renford Dills, MD;  Location: [facility];  Service: Vascular;  Laterality: Left;  . APPENDECTOMY  1969  . BREAST BIOPSY Right 1970   neg  . ECTOPIC PREGNANCY SURGERY Right 1969  . FOREIGN BODY REMOVAL Left 07/28/2016   Procedure: FOREIGN BODY REMOVAL ADULT ( LEG );  Surgeon: Renford Dills, MD;  Location: [facility];  Service: Vascular;  Laterality: Left;  . PERIPHERAL VASCULAR CATHETERIZATION N/A 07/22/2015   Procedure: Abdominal Aortogram w/Lower Extremity;  Surgeon: Annice Needy, MD;  Location: [facility];  Service: Cardiovascular;  Laterality: N/A;  . PERIPHERAL VASCULAR CATHETERIZATION  07/22/2015   Procedure: Lower Extremity Intervention;  Surgeon: Annice Needy, MD;  Location: [facility];  Service: Cardiovascular;;  . stents left leg      Social History Social History   Tobacco Use  . Smoking status: Light Tobacco Smoker    Packs/day: 0.25    Years: 55.00    Pack years: 13.75    Last attempt to quit: 01/20/2015    Years since quitting: 5.0  . Smokeless tobacco: Never Used  Substance Use Topics  . Alcohol use: No  . Drug use: No    Family History Family History  Problem Relation Age of Onset  . CAD Mother   . Hypertension Mother   . Stroke  Mother   . CAD Father   . Hypertension Father   . Breast cancer Neg Hx   No family history of bleeding/clotting disorders, porphyria or autoimmune disease   Allergies  Allergen Reactions  . Morphine And Related Diarrhea and Nausea And Vomiting     REVIEW OF SYSTEMS (Negative unless checked)  Constitutional: [] Weight loss  [] Fever  [] Chills Cardiac: [] Chest pain   [] Chest pressure   [] Palpitations   [] Shortness of breath when laying flat   [] Shortness of breath with exertion. Vascular:  [] Pain in legs with walking   [] Pain in legs at rest  [] History of DVT   [] Phlebitis   [] Swelling in legs   [] Varicose veins   [] Non-healing ulcers Pulmonary:   [] Uses home  oxygen   [] Productive cough   [] Hemoptysis   [] Wheeze  [] COPD   [] Asthma Neurologic:  [] Dizziness   [] Seizures   [] History of stroke   [] History of TIA  [] Aphasia   [] Vissual changes   [] Weakness or numbness in arm   [] Weakness or numbness in leg Musculoskeletal:   [] Joint swelling   [] Joint pain   [] Low back pain Hematologic:  [] Easy bruising  [] Easy bleeding   [] Hypercoagulable state   [] Anemic Gastrointestinal:  [] Diarrhea   [] Vomiting  [] Gastroesophageal reflux/heartburn   [] Difficulty swallowing. Genitourinary:  [] Chronic kidney disease   [] Difficult urination  [] Frequent urination   [] Blood in urine Skin:  [] Rashes   [] Ulcers  Psychological:  [] History of anxiety   []  History of major depression.  Physical Examination  Vitals:   02/12/20 1440  BP: 123/78  Pulse: 84  Weight: 104 lb (47.2 kg)   Body mass index is 31.74 kg/m. Gen: WD/WN, NAD Head: [place]/AT, No temporalis wasting.  Ear/Nose/Throat: Hearing grossly intact, nares w/o erythema or drainage, poor dentition Eyes: PER, EOMI, sclera nonicteric.  Neck: Supple, no masses.  No bruit or JVD.  Pulmonary:  Good air movement, clear to auscultation bilaterally, no use of accessory muscles.  Cardiac: RRR, normal S1, S2, no Murmurs. Vascular: Bilateral carotid bruits bilateral above-knee amputations well-healed Vessel Right Left  Radial Palpable Palpable  PT AKA AKA  DP AKA AKA  Gastrointestinal: soft, non-distended. No guarding/no peritoneal signs.  Musculoskeletal: M/S 5/5 throughout.  Bilateral AKA deformity no atrophy.  Neurologic: CN 2-12 intact. Pain and light touch intact in extremities.  Symmetrical.  Speech is fluent. Motor exam as listed above. Psychiatric: Judgment intact, Mood & affect appropriate for pt's clinical situation. Dermatologic: No rashes or ulcers noted.  No changes consistent with cellulitis.  CBC Lab Results  Component Value Date   WBC 8.1 05/27/2018   HGB 13.2 05/27/2018   HCT 38.9 05/27/2018   MCV  88.2 05/27/2018   PLT 268 05/27/2018    BMET    Component Value Date/Time   NA 138 05/27/2018 1104   NA 131 (L) 02/13/2015 0426   K 4.0 05/27/2018 1104   K 3.0 (L) 02/13/2015 0426   CL 108 05/27/2018 1104   CL 103 02/13/2015 0426   CO2 24 05/27/2018 1104   CO2 23 02/13/2015 0426   GLUCOSE 88 05/27/2018 1104   GLUCOSE 104 (H) 02/13/2015 0426   BUN 14 05/27/2018 1104   BUN 10 02/13/2015 0426   CREATININE 0.65 05/27/2018 1104   CREATININE 0.83 02/13/2015 0426   CALCIUM 9.0 05/27/2018 1104   CALCIUM 7.3 (L) 02/13/2015 0426   GFRNONAA >60 05/27/2018 1104   GFRNONAA >60 02/13/2015 0426   GFRAA >60 05/27/2018 1104   GFRAA >60 02/13/2015  0426   CrCl cannot be calculated (Patient's most recent lab result is older than the maximum 21 days allowed.).  COAG Lab Results  Component Value Date   INR 1.36 07/28/2016   INR 2.6 07/24/2016   INR 1.07 11/19/2015    Radiology US Carotid Duplex Bilateral  Result Date: 01/23/2020 Allyne Gee, MD     02/08/2020 11:09 AM [place], [place] [address] DATE OF SERVICE: January 23, 2020 CAROTID DOPPLER INTERPRETATION: Bilateral Carotid Ultrsasound and Color Doppler Examination was performed. The RIGHT CCA shows heterogenous severe plaque in the vessel. The LEFT CCA shows severe heterogenous plaque in the vessel. There was significant intimal thickening noted in the RIGHT carotid artery. There was significant intimal thickening in the LEFT carotid artery. The RIGHT CCA shows peak systolic velocity of 76 cm per second. The end diastolic velocity is 23 cm per second on the RIGHT side. The RIGHT ICA shows peak systolic velocity of 573 per second. RIGHT sided ICA end diastolic velocity is 83 cm per second. The RIGHT ECA shows a peak systolic velocity of 220 cm per second. The ICA/CCA ratio is calculated to be 40. This suggests greater than 70% stenosis. The Vertebral Artery shows antegrade flow. The LEFT CCA shows peak  systolic velocity of 73 cm per second. The end diastolic velocity is 21 cm per second on the LEFT side. The LEFT ICA shows peak systolic velocity of 254 per second. LEFT sided ICA end diastolic velocity is 94 cm per second. The LEFT ECA shows a peak systolic velocity of 270 cm per second. The ICA/CCA ratio is calculated to be 3.7. This suggests greater than 70% stenosis. The Vertebral Artery shows antegrade flow. Impression:  The RIGHT CAROTID shows greater than 70% stenosis. The LEFT CAROTID shows greater than 70% stenosis.  There is severe heterogenous plaque formation noted on the LEFT and severe heterogenous plaque on the RIGHT  side. Consider a repeat Carotid doppler if clinical situation and symptoms warrant in 6-12 months. Patient should be encouraged to change lifestyles such as smoking cessation, regular exercise and dietary modification. Use of statins in the right clinical setting and ASA is encouraged. Allyne Gee, MD [facility] Pulmonary Critical Care Medicine     Assessment/Plan 1. Bilateral carotid artery stenosis The patient remains asymptomatic with respect to the carotid stenosis.  However, the patient has now progressed and has a lesion the is >70%.  Patient should undergo CT angiography of the carotid arteries to define the degree of stenosis of the internal carotid arteries bilaterally and the anatomic suitability for surgery vs. intervention.  If the patient does indeed need surgery cardiac clearance will be required, once cleared the patient will be scheduled for surgery.  The risks, benefits and alternative therapies were reviewed in detail with the patient.  All questions were answered.  The patient agrees to proceed with imaging.  Continue antiplatelet therapy as prescribed. Continue management of CAD, HTN and Hyperlipidemia.  - CT ANGIO NECK W OR WO CONTRAST; Future  2. Status post above-knee amputation of both lower extremities ([place]) Patient is status post bilateral  above-knee amputations remotely.  She has not been able to wear bilateral above-knee prostheses and so has been confined to a wheelchair.  To her credit she is maintained her activity level and frequently watches grandchildren.  At this time she is requesting help obtaining an electric wheelchair with a lift seat.  I think this is an excellent idea.  3. Essential  hypertension Continue antihypertensive medications as already ordered, these medications have been reviewed and there are no changes at this time.   4. Chronic systolic heart failure (HCC) Continue cardiac and antihypertensive medications as already ordered and reviewed, no changes at this time.  Continue statin as ordered and reviewed, no changes at this time  Nitrates PRN for chest pain    Levora Dredge, MD  02/13/2020 5:21 PM

## 2020-02-23 ENCOUNTER — Other Ambulatory Visit: Payer: Self-pay

## 2020-02-23 ENCOUNTER — Ambulatory Visit
Admission: RE | Admit: 2020-02-23 | Discharge: 2020-02-23 | Disposition: A | Discharge status: Home / Self Care | Payer: Medicare HMO | Source: Ambulatory Visit | Attending: Vascular Surgery | Admitting: Vascular Surgery

## 2020-02-23 DIAGNOSIS — I6523 Occlusion and stenosis of bilateral carotid arteries: Secondary | ICD-10-CM | POA: Insufficient documentation

## 2020-02-23 LAB — POCT I-STAT CREATININE: Creatinine, Ser: 0.7 mg/dL (ref 0.44–1.00)

## 2020-02-23 MED ORDER — IOHEXOL 350 MG/ML SOLN
75.0000 mL | Freq: Once | INTRAVENOUS | Status: AC | PRN
  Administered 2020-02-23: 12:00:00 75 mL via INTRAVENOUS

## 2020-02-29 ENCOUNTER — Emergency Department: Payer: Medicare HMO

## 2020-02-29 ENCOUNTER — Other Ambulatory Visit: Payer: Self-pay

## 2020-02-29 ENCOUNTER — Inpatient Hospital Stay
Admission: EM | Admit: 2020-02-29 | Discharge: 2020-03-03 | DRG: 357 | Disposition: A | Discharge status: Home / Self Care | Payer: Medicare HMO | Attending: Internal Medicine | Admitting: Internal Medicine

## 2020-02-29 ENCOUNTER — Inpatient Hospital Stay: Payer: Medicare HMO

## 2020-02-29 DIAGNOSIS — Z885 Allergy status to narcotic agent status: Secondary | ICD-10-CM | POA: Diagnosis not present

## 2020-02-29 DIAGNOSIS — I5022 Chronic systolic (congestive) heart failure: Secondary | ICD-10-CM | POA: Diagnosis present

## 2020-02-29 DIAGNOSIS — F17200 Nicotine dependence, unspecified, uncomplicated: Secondary | ICD-10-CM | POA: Diagnosis not present

## 2020-02-29 DIAGNOSIS — I739 Peripheral vascular disease, unspecified: Secondary | ICD-10-CM | POA: Diagnosis present

## 2020-02-29 DIAGNOSIS — R21 Rash and other nonspecific skin eruption: Secondary | ICD-10-CM | POA: Diagnosis present

## 2020-02-29 DIAGNOSIS — Z720 Tobacco use: Secondary | ICD-10-CM | POA: Diagnosis present

## 2020-02-29 DIAGNOSIS — I70203 Unspecified atherosclerosis of native arteries of extremities, bilateral legs: Secondary | ICD-10-CM | POA: Diagnosis present

## 2020-02-29 DIAGNOSIS — Z7982 Long term (current) use of aspirin: Secondary | ICD-10-CM

## 2020-02-29 DIAGNOSIS — I10 Essential (primary) hypertension: Secondary | ICD-10-CM | POA: Diagnosis present

## 2020-02-29 DIAGNOSIS — D509 Iron deficiency anemia, unspecified: Secondary | ICD-10-CM | POA: Diagnosis present

## 2020-02-29 DIAGNOSIS — Z8249 Family history of ischemic heart disease and other diseases of the circulatory system: Secondary | ICD-10-CM | POA: Diagnosis not present

## 2020-02-29 DIAGNOSIS — Z89611 Acquired absence of right leg above knee: Secondary | ICD-10-CM

## 2020-02-29 DIAGNOSIS — F17210 Nicotine dependence, cigarettes, uncomplicated: Secondary | ICD-10-CM | POA: Diagnosis present

## 2020-02-29 DIAGNOSIS — K5730 Diverticulosis of large intestine without perforation or abscess without bleeding: Secondary | ICD-10-CM | POA: Diagnosis not present

## 2020-02-29 DIAGNOSIS — K8020 Calculus of gallbladder without cholecystitis without obstruction: Secondary | ICD-10-CM | POA: Diagnosis not present

## 2020-02-29 DIAGNOSIS — N200 Calculus of kidney: Secondary | ICD-10-CM | POA: Diagnosis present

## 2020-02-29 DIAGNOSIS — Z20822 Contact with and (suspected) exposure to covid-19: Secondary | ICD-10-CM | POA: Diagnosis not present

## 2020-02-29 DIAGNOSIS — R110 Nausea: Secondary | ICD-10-CM | POA: Diagnosis not present

## 2020-02-29 DIAGNOSIS — Z91041 Radiographic dye allergy status: Secondary | ICD-10-CM | POA: Diagnosis not present

## 2020-02-29 DIAGNOSIS — Z89612 Acquired absence of left leg above knee: Secondary | ICD-10-CM | POA: Diagnosis not present

## 2020-02-29 DIAGNOSIS — Z823 Family history of stroke: Secondary | ICD-10-CM | POA: Diagnosis not present

## 2020-02-29 DIAGNOSIS — I6523 Occlusion and stenosis of bilateral carotid arteries: Secondary | ICD-10-CM | POA: Diagnosis present

## 2020-02-29 DIAGNOSIS — R69 Illness, unspecified: Secondary | ICD-10-CM | POA: Diagnosis not present

## 2020-02-29 DIAGNOSIS — N270 Small kidney, unilateral: Secondary | ICD-10-CM | POA: Diagnosis present

## 2020-02-29 DIAGNOSIS — R0602 Shortness of breath: Secondary | ICD-10-CM | POA: Diagnosis present

## 2020-02-29 DIAGNOSIS — I700 Atherosclerosis of aorta: Secondary | ICD-10-CM | POA: Diagnosis not present

## 2020-02-29 DIAGNOSIS — K551 Chronic vascular disorders of intestine: Principal | ICD-10-CM | POA: Diagnosis present

## 2020-02-29 DIAGNOSIS — N1339 Other hydronephrosis: Secondary | ICD-10-CM | POA: Diagnosis present

## 2020-02-29 DIAGNOSIS — R1084 Generalized abdominal pain: Secondary | ICD-10-CM

## 2020-02-29 DIAGNOSIS — R069 Unspecified abnormalities of breathing: Secondary | ICD-10-CM | POA: Diagnosis not present

## 2020-02-29 DIAGNOSIS — I7409 Other arterial embolism and thrombosis of abdominal aorta: Secondary | ICD-10-CM | POA: Diagnosis present

## 2020-02-29 DIAGNOSIS — D72829 Elevated white blood cell count, unspecified: Secondary | ICD-10-CM | POA: Diagnosis not present

## 2020-02-29 DIAGNOSIS — K922 Gastrointestinal hemorrhage, unspecified: Secondary | ICD-10-CM | POA: Diagnosis not present

## 2020-02-29 DIAGNOSIS — R195 Other fecal abnormalities: Secondary | ICD-10-CM

## 2020-02-29 DIAGNOSIS — Z79899 Other long term (current) drug therapy: Secondary | ICD-10-CM

## 2020-02-29 DIAGNOSIS — G546 Phantom limb syndrome with pain: Secondary | ICD-10-CM | POA: Diagnosis present

## 2020-02-29 DIAGNOSIS — I714 Abdominal aortic aneurysm, without rupture: Secondary | ICD-10-CM | POA: Diagnosis present

## 2020-02-29 DIAGNOSIS — I110 Hypertensive heart disease with heart failure: Secondary | ICD-10-CM | POA: Diagnosis not present

## 2020-02-29 DIAGNOSIS — N132 Hydronephrosis with renal and ureteral calculous obstruction: Secondary | ICD-10-CM | POA: Diagnosis not present

## 2020-02-29 DIAGNOSIS — R197 Diarrhea, unspecified: Secondary | ICD-10-CM | POA: Diagnosis not present

## 2020-02-29 LAB — HEMOGLOBIN AND HEMATOCRIT, BLOOD
HCT: 35.2 % — ABNORMAL LOW (ref 36.0–46.0)
Hemoglobin: 11.1 g/dL — ABNORMAL LOW (ref 12.0–15.0)

## 2020-02-29 LAB — LACTIC ACID, PLASMA: Lactic Acid, Venous: 1.6 mmol/L (ref 0.5–1.9)

## 2020-02-29 LAB — COMPREHENSIVE METABOLIC PANEL
ALT: 11 U/L (ref 0–44)
AST: 19 U/L (ref 15–41)
Albumin: 3.8 g/dL (ref 3.5–5.0)
Alkaline Phosphatase: 106 U/L (ref 38–126)
Anion gap: 12 (ref 5–15)
BUN: 16 mg/dL (ref 8–23)
CO2: 20 mmol/L — ABNORMAL LOW (ref 22–32)
Calcium: 9.2 mg/dL (ref 8.9–10.3)
Chloride: 107 mmol/L (ref 98–111)
Creatinine, Ser: 0.94 mg/dL (ref 0.44–1.00)
GFR calc Af Amer: >60 mL/min (ref >60)
GFR calc non Af Amer: 60 mL/min — ABNORMAL LOW (ref >60)
Glucose, Bld: 166 mg/dL — ABNORMAL HIGH (ref 70–99)
Potassium: 3.8 mmol/L (ref 3.5–5.1)
Sodium: 139 mmol/L (ref 135–145)
Total Bilirubin: 0.6 mg/dL (ref 0.3–1.2)
Total Protein: 7.2 g/dL (ref 6.5–8.1)

## 2020-02-29 LAB — CBC
HCT: 46.3 % — ABNORMAL HIGH (ref 36.0–46.0)
HCT: 36.7 % (ref 36.0–46.0)
Hemoglobin: 14.6 g/dL (ref 12.0–15.0)
Hemoglobin: 11.7 g/dL — ABNORMAL LOW (ref 12.0–15.0)
MCH: 29.2 pg (ref 26.0–34.0)
MCH: 29.2 pg (ref 26.0–34.0)
MCHC: 31.5 g/dL (ref 30.0–36.0)
MCHC: 31.9 g/dL (ref 30.0–36.0)
MCV: 92.6 fL (ref 80.0–100.0)
MCV: 91.5 fL (ref 80.0–100.0)
Platelets: 307 10*3/uL (ref 150–400)
Platelets: 229 10*3/uL (ref 150–400)
RBC: 5 MIL/uL (ref 3.87–5.11)
RBC: 4.01 MIL/uL (ref 3.87–5.11)
RDW: 15.2 % (ref 11.5–15.5)
RDW: 15 % (ref 11.5–15.5)
WBC: 22 10*3/uL — ABNORMAL HIGH (ref 4.0–10.5)
WBC: 23 10*3/uL — ABNORMAL HIGH (ref 4.0–10.5)
nRBC: 0 % (ref 0.0–0.2)
nRBC: 0 % (ref 0.0–0.2)

## 2020-02-29 LAB — MRSA PCR SCREENING: MRSA by PCR: NEGATIVE

## 2020-02-29 LAB — TYPE AND SCREEN
ABO/RH(D): O POS
Antibody Screen: NEGATIVE

## 2020-02-29 LAB — PROTIME-INR
INR: 1.1 (ref 0.8–1.2)
Prothrombin Time: 13.7 seconds (ref 11.4–15.2)

## 2020-02-29 LAB — APTT: aPTT: 32 seconds (ref 24–36)

## 2020-02-29 LAB — GLUCOSE, CAPILLARY: Glucose-Capillary: 86 mg/dL (ref 70–99)

## 2020-02-29 LAB — SARS CORONAVIRUS 2 BY RT PCR (HOSPITAL ORDER, PERFORMED IN ~~LOC~~ HOSPITAL LAB): SARS Coronavirus 2: NEGATIVE

## 2020-02-29 MED ORDER — HYDROMORPHONE HCL 1 MG/ML IJ SOLN
1.0000 mg | INTRAMUSCULAR | Status: DC | PRN
  Administered 2020-02-29 – 2020-03-01 (×3): 1 mg via INTRAVENOUS
  Filled 2020-02-29 (×4): qty 1

## 2020-02-29 MED ORDER — CHLORHEXIDINE GLUCONATE CLOTH 2 % EX PADS
6.0000 | MEDICATED_PAD | Freq: Every day | CUTANEOUS | Status: DC
  Administered 2020-02-29 – 2020-03-01 (×2): 6 via TOPICAL

## 2020-02-29 MED ORDER — DIPHENHYDRAMINE HCL 25 MG PO CAPS
50.0000 mg | ORAL_CAPSULE | Freq: Once | ORAL | Status: AC
  Administered 2020-02-29: 50 mg via ORAL
  Filled 2020-02-29: qty 2

## 2020-02-29 MED ORDER — ERGOCALCIFEROL 1.25 MG (50000 UT) PO CAPS: 50000.0000 [IU] | ORAL_CAPSULE | ORAL | Status: DC

## 2020-02-29 MED ORDER — HEPARIN (PORCINE) 25000 UT/250ML-% IV SOLN
750.0000 [IU]/h | INTRAVENOUS | Status: DC
  Administered 2020-02-29: 750 [IU]/h via INTRAVENOUS
  Filled 2020-02-29: qty 250

## 2020-02-29 MED ORDER — HYDROMORPHONE HCL 1 MG/ML IJ SOLN
INTRAMUSCULAR | Status: AC
  Filled 2020-02-29: qty 1

## 2020-02-29 MED ORDER — ONDANSETRON HCL 4 MG/2ML IJ SOLN: 4.0000 mg | Freq: Once | INTRAMUSCULAR | Status: DC

## 2020-02-29 MED ORDER — ONDANSETRON HCL 4 MG PO TABS: 4.0000 mg | ORAL_TABLET | Freq: Four times a day (QID) | ORAL | Status: DC | PRN

## 2020-02-29 MED ORDER — HYDROMORPHONE HCL 1 MG/ML IJ SOLN: 1.0000 mg | Freq: Once | INTRAMUSCULAR | Status: AC

## 2020-02-29 MED ORDER — SODIUM CHLORIDE 0.9 % IV SOLN: INTRAVENOUS | Status: DC

## 2020-02-29 MED ORDER — FENTANYL CITRATE (PF) 100 MCG/2ML IJ SOLN
50.0000 ug | Freq: Once | INTRAMUSCULAR | Status: AC
  Administered 2020-02-29: 50 ug via INTRAVENOUS
  Filled 2020-02-29: qty 2

## 2020-02-29 MED ORDER — DIPHENHYDRAMINE HCL 50 MG/ML IJ SOLN: 50.0000 mg | Freq: Once | INTRAMUSCULAR | Status: AC

## 2020-02-29 MED ORDER — HYDROMORPHONE HCL 1 MG/ML IJ SOLN
INTRAMUSCULAR | Status: AC
  Administered 2020-02-29: 1 mg via INTRAVENOUS
  Filled 2020-02-29: qty 1

## 2020-02-29 MED ORDER — SODIUM CHLORIDE 0.9 % IV SOLN
8.0000 mg/h | INTRAVENOUS | Status: DC
  Administered 2020-02-29 – 2020-03-01 (×4): 8 mg/h via INTRAVENOUS
  Filled 2020-02-29 (×4): qty 80

## 2020-02-29 MED ORDER — IOHEXOL 350 MG/ML SOLN
75.0000 mL | Freq: Once | INTRAVENOUS | Status: AC | PRN
  Administered 2020-02-29: 75 mL via INTRAVENOUS

## 2020-02-29 MED ORDER — MORPHINE SULFATE (PF) 4 MG/ML IV SOLN
4.0000 mg | INTRAVENOUS | Status: DC | PRN
  Filled 2020-02-29: qty 1

## 2020-02-29 MED ORDER — SODIUM CHLORIDE 0.9 % IV SOLN
80.0000 mg | Freq: Once | INTRAVENOUS | Status: AC
  Administered 2020-02-29: 80 mg via INTRAVENOUS
  Filled 2020-02-29: qty 80

## 2020-02-29 MED ORDER — HYDROCORTISONE NA SUCCINATE PF 250 MG IJ SOLR
200.0000 mg | Freq: Once | INTRAMUSCULAR | Status: AC
  Administered 2020-02-29: 200 mg via INTRAVENOUS
  Filled 2020-02-29: qty 200

## 2020-02-29 MED ORDER — HEPARIN BOLUS VIA INFUSION
2000.0000 [IU] | Freq: Once | INTRAVENOUS | Status: AC
  Administered 2020-02-29: 2000 [IU] via INTRAVENOUS
  Filled 2020-02-29: qty 2000

## 2020-02-29 MED ORDER — ONDANSETRON HCL 4 MG/2ML IJ SOLN: 4.0000 mg | Freq: Four times a day (QID) | INTRAMUSCULAR | Status: DC | PRN

## 2020-02-29 MED ORDER — ONDANSETRON HCL 4 MG/2ML IJ SOLN
4.0000 mg | Freq: Once | INTRAMUSCULAR | Status: AC
  Administered 2020-02-29: 4 mg via INTRAVENOUS
  Filled 2020-02-29: qty 2

## 2020-02-29 MED ORDER — VITAMIN D 25 MCG (1000 UNIT) PO TABS
1000.0000 [IU] | ORAL_TABLET | Freq: Every day | ORAL | Status: DC
  Administered 2020-02-29 – 2020-03-03 (×4): 1000 [IU] via ORAL
  Filled 2020-02-29 (×5): qty 1

## 2020-02-29 MED ORDER — ATORVASTATIN CALCIUM 20 MG PO TABS
40.0000 mg | ORAL_TABLET | Freq: Every day | ORAL | Status: DC
  Administered 2020-02-29 – 2020-03-01 (×2): 40 mg via ORAL
  Filled 2020-02-29 (×2): qty 2

## 2020-02-29 MED ORDER — SODIUM CHLORIDE 0.9 % IV BOLUS
1000.0000 mL | Freq: Once | INTRAVENOUS | Status: AC
  Administered 2020-02-29: 1000 mL via INTRAVENOUS

## 2020-02-29 MED ORDER — HYDROMORPHONE HCL 1 MG/ML IJ SOLN
0.5000 mg | Freq: Once | INTRAMUSCULAR | Status: AC
  Administered 2020-02-29: 0.5 mg via INTRAVENOUS

## 2020-02-29 NOTE — Consult Note (Signed)
Whitney Bouillon, MD [address], [address], [place], [place], [address] [address], [address], [place], [place], [address] Phone: [phone]  Fax: [phone]  Consultation  Referring Provider:     Dr. Dartha [name] Primary Care Physician:  Whitney Jews, NP Reason for Consultation:     Guaiac positive stool  Date of Admission:  02/29/2020 Date of Consultation:  02/29/2020         HPI:   [name] is a 74 y.o. female presents with leg pain. The patient denies abdominal or flank pain, anorexia, vomiting, dysphagia,  black or bloody stools or weight loss.  ROS reveals she had nausea yesterday with no vomiting, and 1 episode of loose stool yesterday night with no blood.  No melena.  Noted to have an elevated white count to 22K on admission.  CT does not show any active bowel inflammation.  Chronic left-sided hydronephrosis, severe in degree reported.  Multiple left renal stones also reported.  Has history of peripheral arterial disease and bilateral AKA  Past Medical History:  Diagnosis Date  . Hypertension   . Peripheral vascular disease (HCC)   . Pneumonia 07/14/15  . Tobacco abuse   . Unilateral small kidney    a. one functional kidney    Past Surgical History:  Procedure Laterality Date  . ABOVE KNEE LEG AMPUTATION Right 01/2015  . AMPUTATION Left 07/16/2015   Procedure: AMPUTATION ABOVE KNEE;  Surgeon: Renford Dills, MD;  Location: [facility];  Service: Vascular;  Laterality: Left;  . AMPUTATION Left 07/23/2015   Procedure:   WOUND CLOSURE;  Surgeon: Renford Dills, MD;  Location: [facility];  Service: Vascular;  Laterality: Left;  . AMPUTATION Left 11/19/2015   Procedure:  ( REVISION ) AMPUTATION ABOVE KNEE ;  Surgeon: Renford Dills, MD;  Location: [facility];  Service: Vascular;  Laterality: Left;  . APPENDECTOMY  1969  . BREAST BIOPSY Right 1970   neg  . ECTOPIC PREGNANCY SURGERY Right 1969  . FOREIGN BODY REMOVAL Left 07/28/2016   Procedure: FOREIGN  BODY REMOVAL ADULT ( LEG );  Surgeon: Renford Dills, MD;  Location: [facility];  Service: Vascular;  Laterality: Left;  . PERIPHERAL VASCULAR CATHETERIZATION N/A 07/22/2015   Procedure: Abdominal Aortogram w/Lower Extremity;  Surgeon: Annice Needy, MD;  Location: [facility];  Service: Cardiovascular;  Laterality: N/A;  . PERIPHERAL VASCULAR CATHETERIZATION  07/22/2015   Procedure: Lower Extremity Intervention;  Surgeon: Annice Needy, MD;  Location: [facility];  Service: Cardiovascular;;  . stents left leg      Prior to Admission medications   Medication Sig Start Date End Date Taking? Authorizing Provider  aspirin 81 MG tablet Take 81 mg by mouth every morning.    Yes [provider]  atorvastatin (LIPITOR) 40 MG tablet Take 1 tablet (40 mg total) by mouth at bedtime. 01/20/20  Yes Boscia, Kathlynn Grate, NP  cholecalciferol (VITAMIN D3) 25 MCG (1000 UNIT) tablet Take 1,000 Units by mouth daily.   Yes [provider]  famotidine (PEPCID) 20 MG tablet Take 1 tablet (20 mg total) by mouth every morning. 12/15/19  Yes Boscia, Kathlynn Grate, NP  HYDROcodone-acetaminophen (NORCO/VICODIN) 5-325 MG tablet Take 1 tablet by mouth every 6 (six) hours as needed for moderate pain. 12/29/19  Yes Boscia, Heather E, NP  lisinopril (ZESTRIL) 20 MG tablet Take 1 tablet (20 mg total) by mouth daily. 12/15/19  Yes Whitney Jews, NP  magnesium hydroxide (  MILK OF MAGNESIA) 400 MG/5ML suspension Take 30 mLs by mouth daily as needed for mild constipation.   Yes [provider]  traMADol (ULTRAM) 50 MG tablet Take 50 mg by mouth as needed.   Yes Christie Nottingham, PA  ergocalciferol (DRISDOL) 50000 units capsule Take 1 capsule (50,000 Units total) by mouth once a week. Patient not taking: Reported on 02/12/2020 05/29/18   Ronnell Freshwater, NP    Family History  Problem Relation Age of Onset  . CAD Mother   . Hypertension Mother   . Stroke Mother   . CAD Father   . Hypertension Father    . Breast cancer Neg Hx      Social History   Tobacco Use  . Smoking status: Light Tobacco Smoker    Packs/day: 0.25    Years: 55.00    Pack years: 13.75    Last attempt to quit: 01/20/2015    Years since quitting: 5.1  . Smokeless tobacco: Never Used  Substance Use Topics  . Alcohol use: No  . Drug use: No    Allergies as of 02/29/2020 - Review Complete 02/29/2020  Allergen Reaction Noted  . Contrast media [iodinated diagnostic agents] Rash 02/29/2020  . Morphine and related Diarrhea and Nausea And Vomiting 02/13/2015    Review of Systems:    All systems reviewed and negative except where noted in HPI.   Physical Exam:  Vital signs in last 24 hours: Vitals:   02/29/20 0900 02/29/20 0915 02/29/20 0930 02/29/20 1000  BP: 122/61 127/69 126/67 112/62  Pulse: 79 90 84 80  Resp: 17  18 17   Temp:      TempSrc:      SpO2: 95% 96% 94% 100%  Weight:         General:   Pleasant, cooperative in NAD Head:  Normocephalic and atraumatic. Eyes:   No icterus.   Conjunctiva pink. PERRLA. Ears:  Normal auditory acuity. Neck:  Supple; no masses or thyroidomegaly Lungs: Respirations even and unlabored. Lungs clear to auscultation bilaterally.   No wheezes, crackles, or rhonchi.  Abdomen:  Soft, nondistended, nontender. Normal bowel sounds. No appreciable masses or hepatomegaly.  No rebound or guarding.  Neurologic:  Alert and oriented x3;  grossly normal neurologically. Skin:  Intact without significant lesions or rashes. Cervical Nodes:  No significant cervical adenopathy. Psych:  Alert and cooperative. Normal affect.  LAB RESULTS: Recent Labs    02/29/20 0139 02/29/20 0730  WBC 22.0* 23.0*  HGB 14.6 11.7*  HCT 46.3* 36.7  PLT 307 229   BMET Recent Labs    02/29/20 0139  NA 139  K 3.8  CL 107  CO2 20*  GLUCOSE 166*  BUN 16  CREATININE 0.94  CALCIUM 9.2   LFT Recent Labs    02/29/20 0139  PROT 7.2  ALBUMIN 3.8  AST 19  ALT 11  ALKPHOS 106  BILITOT 0.6     PT/INR No results for input(s): LABPROT, INR in the last 72 hours.  STUDIES: CT ABDOMEN PELVIS WO CONTRAST  Result Date: 02/29/2020 CLINICAL DATA:  Abdominal pain, nausea and diarrhea. EXAM: CT ABDOMEN AND PELVIS WITHOUT CONTRAST TECHNIQUE: Multidetector CT imaging of the abdomen and pelvis was performed following the standard protocol without IV contrast. COMPARISON:  CT abdomen dated 09/12/2006. FINDINGS: Lower chest: Chronic scarring/atelectasis at each lung base. Hepatobiliary: Small layering stones within the otherwise normal-appearing gallbladder. No focal liver abnormality. No bile duct dilatation seen. Pancreas: Unremarkable. No pancreatic ductal dilatation or  surrounding inflammatory changes. Spleen: Normal in size without focal abnormality. Adrenals/Urinary Tract: Chronic LEFT-sided hydronephrosis, severe in degree. Multiple dystrophic renal stones within the LEFT renal pelvis, largest measuring 1.4 cm. No RIGHT-sided hydronephrosis. Bladder appears normal. Several small calcifications within the LEFT lower pelvis, largest measuring 4 mm, of uncertain relationship to the distal LEFT ureter, but more likely vascular calcifications just outside of the distal LEFT ureter. No hydroureter. Stomach/Bowel: No dilated large or small bowel loops. Scattered diverticulosis of the LEFT colon but no focal inflammatory change seen to suggest acute diverticulitis at this time. No bowel wall thickening, mesenteric fluid or other secondary signs of active bowel wall inflammation. Moderate amount of stool and gas throughout the colon. Stomach is unremarkable, partially decompressed. Appendix is not seen but there are no inflammatory changes about the cecum to suggest acute appendicitis. Vascular/Lymphatic: Aortic atherosclerosis. 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 Bilateral iliac stents in place. Reproductive: No adnexal mass or free fluid. Other: No free fluid  or abscess collection identified. No free intraperitoneal air. Musculoskeletal: No acute or suspicious osseous finding. Superficial soft tissues are unremarkable. Soft tissues of the upper thighs are unremarkable. IMPRESSION: 1. No acute findings within the abdomen or pelvis. No bowel obstruction or evidence of active bowel wall inflammation. No free fluid or inflammatory change. No evidence of acute solid organ abnormality. No renal or ureteral calculi. 2. Chronic LEFT-sided hydronephrosis, severe in degree, possibly congenital. Multiple LEFT renal stones, largest measuring 1.4 cm. No obstructing ureteral stone. 3. Colonic diverticulosis without evidence of acute diverticulitis at this time. 4. Cholelithiasis without evidence of acute cholecystitis. 5. 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 Aortic Atherosclerosis (ICD10-I70.0). Electronically Signed   By: Bary Richard M.D.   On: 02/29/2020 04:17      Impression / Plan:   AVALINA BENKO is a 74 y.o. y/o female with hemoccult positive stool, without any gross bleeding  Given her vascular history, patient is scheduled for CTA today which is pending. Her presenting symptom is leg pain and this should be worked up with Vascular surgery  Patient is hemodynamically stable but had a drop in her hemoglobin without any signs of active GI bleeding.  Guaiac is positive but no gross blood.   With the markedly elevated white count, and loose stool, will need to rule out infectious etiology if loose stools reoccur.    Please obtain GI panel and C. Difficile if another loose stool occurs  Agree with Protonix 40 IV twice daily.  However, no evidence of hemodynamically significant bleed at this time, and no gross blood from any source at this time. Can d/c protonix if hemoglobin does not drop any further over next 8-12 hrs  Continue serial CBCs and transfuse PRN Avoid NSAIDs Maintain 2 large-bore IV  lines Please page GI with any acute hemodynamic changes, or signs of active GI bleeding  Can determine need for any inpatient endoscopic procedures after above work-up is complete.  No indication for urgent endoscopy at this time given no active GI bleeding present  Would also recommend further work-up with urology for severe hydronephrosis as well  Pt has never had a colonoscopy and elective procedure for hemoccult positive stool as an outpatient was discussed and she verbalized understanding. Follow up in GI clinic in 3-4 weeks  Thank you for involving me in the care of this patient.      LOS: 0 days   Clancy Mullarkey B  Maximino Greenland, MD  02/29/2020, 10:31 AM

## 2020-02-29 NOTE — ED Notes (Signed)
Pt taken to CT at this time.

## 2020-02-29 NOTE — ED Provider Notes (Signed)
Patient received in signout from Dr. Manson Passey.  Repeat CBC showed patient's hemoglobin dropped 3 g.  On recheck does have guaiac positive stool.  Does have a history of reflux.  No history of cirrhosis.  Will start Protonix.  Will type and screen.  Will continue with plan for CT angiogram.  Discussed case with hospitalist for admission.   Willy Eddy, MD 02/29/20 [phone]

## 2020-02-29 NOTE — ED Provider Notes (Signed)
Radiology notify me of the critical result of her CT angiogram.  These results were relayed to the hospitalist who is admitted the patient and I did consult vascular surgery for further recommendations.  Dr. Evie Lacks does recommend heparinization will evaluate the patient at bedside.   Willy Eddy, MD 02/29/20 [phone]

## 2020-02-29 NOTE — ED Notes (Signed)
Pharmacy contacted for need for new bag of protonix. States the will send to ICU. Sarah, RN will be transporting pt to ICU

## 2020-02-29 NOTE — ED Notes (Signed)
Blood cultures set #1 obtained from [facility] and sent to lab by this tech.

## 2020-02-29 NOTE — ED Notes (Signed)
Pt refuses COVID swab. Pt states that she was informed by a MD with a "black cap" that she did not need the COVID test due to receiving both vaccines. Pt states she does not need the swab. ICU notified.

## 2020-02-29 NOTE — ED Notes (Addendum)
Pt stating she does not want CT with contrast b/c she just had one and now has a rash all over. MD and CT made aware.

## 2020-02-29 NOTE — ED Triage Notes (Signed)
Pt arrives from home via acems, double amputee.  Nausea and diarrhea x 2, no vomiting, family concerned bc hx of clogged carotid arteries, family is worried about stroke.  Daughter on the way with wheelchair.  Upon arrival pt is alert and oriented and in no acute distress.  Rash is noted over body; pt states she had a CT with contrast recently.

## 2020-02-29 NOTE — H&P (Addendum)
History and Physical    [name] DDU:[id number] DOB: 12-30-1945 DOA: 02/29/2020  PCP: Carlean Jews, NP   Patient coming from: Home  I have personally briefly reviewed patient's old medical records in [facility]  Chief Complaint: Diarrhea  HPI: [name] is a 74 y.o. 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 Symptoms were acute in onset and was associated with diaphoresis and shortness of breath.  She complains of abdominal pain which she rated 10 x 10 in intensity at its worst with radiation to her right leg.  Patient has a generalized nonpruritic rash which she has had since after she had a CT with contrast done on 02/23/20. She denies having any chest pain, cough, fever, chills, headache, dizziness or lightheadedness. Patient had a CT scan of the abdomen and pelvis which showed  no acute findings within the abdomen or pelvis. No bowel obstruction or evidence of active bowel wall inflammation. No free fluid or inflammatory change. No evidence of acute solid organ abnormality. No renal or ureteral calculi. Chronic LEFT-sided hydronephrosis, severe in degree, possibly congenital. Multiple LEFT renal stones, largest measuring 1.4 congenitalcm. No obstructing ureteral stone. Colonic diverticulosis without evidence of acute diverticulitis at this time. Cholelithiasis without evidence of acute cholecystitis. 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 Labs reveal white cell count of 22,000 with a left shift.  Lactate is 1.6.   ED Course: Patient presents to the emergency room for evaluation of sudden onset nausea, abdominal pain and diarrhea which is non bloody.  She had marked leukocytosis on admission and is noted to  have a drop in her hemoglobin from 14.6 to 11.7. Stool is heme positive.  Patient is a vasculopath and is scheduled for a CT angio to rule out an intra-abdominal bleed.   Review of Systems: As per HPI otherwise 10 point review of systems negative.    Past Medical History:  Diagnosis Date  . Hypertension   . Peripheral vascular disease (HCC)   . Pneumonia 07/14/15  . Tobacco abuse   . Unilateral small kidney    a. one functional kidney    Past Surgical History:  Procedure Laterality Date  . ABOVE KNEE LEG AMPUTATION Right 01/2015  . AMPUTATION Left 07/16/2015   Procedure: AMPUTATION ABOVE KNEE;  Surgeon: Renford Dills, MD;  Location: [facility];  Service: Vascular;  Laterality: Left;  . AMPUTATION Left 07/23/2015   Procedure:   WOUND CLOSURE;  Surgeon: Renford Dills, MD;  Location: [facility];  Service: Vascular;  Laterality: Left;  . AMPUTATION Left 11/19/2015   Procedure:  ( REVISION ) AMPUTATION ABOVE KNEE ;  Surgeon: Renford Dills, MD;  Location: [facility];  Service: Vascular;  Laterality: Left;  . APPENDECTOMY  1969  . BREAST BIOPSY Right 1970   neg  . ECTOPIC PREGNANCY SURGERY Right 1969  . FOREIGN BODY REMOVAL Left 07/28/2016   Procedure: FOREIGN BODY REMOVAL ADULT ( LEG );  Surgeon: Renford Dills, MD;  Location: [facility] OR[facility];  Service: Vascular;  Laterality: Left;  . PERIPHERAL VASCULAR CATHETERIZATION N/A 07/22/2015   Procedure: Abdominal Aortogram w/Lower Extremity;  Surgeon: Annice Needy, MD;  Location: [facility];  Service: Cardiovascular;  Laterality: N/A;  . PERIPHERAL VASCULAR CATHETERIZATION  07/22/2015   Procedure: Lower Extremity  Intervention;  Surgeon: Algernon Huxley, MD;  Location: [facility];  Service: Cardiovascular;;  . stents left leg       reports that she has been smoking. She has a 13.75 pack-year smoking history. She has never used smokeless tobacco. She reports that she does not drink alcohol or use drugs.  Allergies  Allergen  Reactions  . Contrast Media [Iodinated Diagnostic Agents] Rash  . Morphine And Related Diarrhea and Nausea And Vomiting    Family History  Problem Relation Age of Onset  . CAD Mother   . Hypertension Mother   . Stroke Mother   . CAD Father   . Hypertension Father   . Breast cancer Neg Hx      Prior to Admission medications   Medication Sig Start Date End Date Taking? Authorizing Provider  aspirin 81 MG tablet Take 81 mg by mouth every morning.    Yes [provider]  atorvastatin (LIPITOR) 40 MG tablet Take 1 tablet (40 mg total) by mouth at bedtime. 01/20/20  Yes Boscia, Greer Ee, NP  cholecalciferol (VITAMIN D3) 25 MCG (1000 UNIT) tablet Take 1,000 Units by mouth daily.   Yes [provider]  famotidine (PEPCID) 20 MG tablet Take 1 tablet (20 mg total) by mouth every morning. 12/15/19  Yes Boscia, Greer Ee, NP  HYDROcodone-acetaminophen (NORCO/VICODIN) 5-325 MG tablet Take 1 tablet by mouth every 6 (six) hours as needed for moderate pain. 12/29/19  Yes Boscia, Heather E, NP  lisinopril (ZESTRIL) 20 MG tablet Take 1 tablet (20 mg total) by mouth daily. 12/15/19  Yes Boscia, Greer Ee, NP  magnesium hydroxide (MILK OF MAGNESIA) 400 MG/5ML suspension Take 30 mLs by mouth daily as needed for mild constipation.   Yes [provider]  traMADol (ULTRAM) 50 MG tablet Take 50 mg by mouth as needed.   Yes Christie Nottingham, PA  ergocalciferol (DRISDOL) 50000 units capsule Take 1 capsule (50,000 Units total) by mouth once a week. Patient not taking: Reported on 02/12/2020 05/29/18   Ronnell Freshwater, NP    Physical Exam: Vitals:   02/29/20 0845 02/29/20 0900 02/29/20 0915 02/29/20 0930  BP: 131/66 122/61 127/69 126/67  Pulse: 81 79 90 84  Resp:  17  18  Temp:      TempSrc:      SpO2: 94% 95% 96% 94%  Weight:         Vitals:   02/29/20 0845 02/29/20 0900 02/29/20 0915 02/29/20 0930  BP: 131/66 122/61 127/69 126/67  Pulse: 81 79 90 84  Resp:  17  18  Temp:        TempSrc:      SpO2: 94% 95% 96% 94%  Weight:        Constitutional: NAD, alert and oriented x 3 Eyes: PERRL, lids and conjunctivae pallor ENMT: Mucous membranes are moist.  Neck: normal, supple, no masses, no thyromegaly Respiratory: clear to auscultation bilaterally, no wheezing, no crackles. Normal respiratory effort. No accessory muscle use.  Cardiovascular: Regular rate and rhythm, no murmurs / rubs / gallops. 2+ pedal pulses. No carotid bruits.  Abdomen: no tenderness, no masses palpated. No hepatosplenomegaly. Bowel sounds positive.  Musculoskeletal: no clubbing / cyanosis. Bilateral AKA Skin: Generalized rashes, lesions, ulcers.  Neurologic: No gross focal neurologic deficit. Psychiatric: Normal mood and affect.   Labs on Admission: I have personally reviewed following labs and imaging studies  CBC: Recent Labs  Lab 02/29/20 0139 02/29/20 0730  WBC 22.0* 23.0*  HGB 14.6 11.7*  HCT 46.3* 36.7  MCV 92.6 91.5  PLT 307 229   Basic Metabolic Panel: Recent Labs  Lab 02/23/20 1151 02/29/20 0139  NA  --  139  K  --  3.8  CL  --  107  CO2  --  20*  GLUCOSE  --  166*  BUN  --  16  CREATININE 0.70 0.94  CALCIUM  --  9.2   GFR: CrCl cannot be calculated (Unknown ideal weight.). Liver Function Tests: Recent Labs  Lab 02/29/20 0139  AST 19  ALT 11  ALKPHOS 106  BILITOT 0.6  PROT 7.2  ALBUMIN 3.8   No results for input(s): LIPASE, AMYLASE in the last 168 hours. No results for input(s): AMMONIA in the last 168 hours. Coagulation Profile: No results for input(s): INR, PROTIME in the last 168 hours. Cardiac Enzymes: No results for input(s): CKTOTAL, CKMB, CKMBINDEX, TROPONINI in the last 168 hours. BNP (last 3 results) No results for input(s): PROBNP in the last 8760 hours. HbA1C: No results for input(s): HGBA1C in the last 72 hours. CBG: No results for input(s): GLUCAP in the last 168 hours. Lipid Profile: No results for input(s): CHOL, HDL, LDLCALC,  TRIG, CHOLHDL, LDLDIRECT in the last 72 hours. Thyroid Function Tests: No results for input(s): TSH, T4TOTAL, FREET4, T3FREE, THYROIDAB in the last 72 hours. Anemia Panel: No results for input(s): VITAMINB12, FOLATE, FERRITIN, TIBC, IRON, RETICCTPCT in the last 72 hours. Urine analysis:    Component Value Date/Time   COLORURINE Yellow 02/12/2015 2217   APPEARANCEUR Cloudy 02/12/2015 2217   LABSPEC 1.015 02/12/2015 2217   PHURINE 7.0 02/12/2015 2217   GLUCOSEU Negative 02/12/2015 2217   HGBUR 2+ 02/12/2015 2217   BILIRUBINUR Negative 02/12/2015 2217   KETONESUR Negative 02/12/2015 2217   PROTEINUR 100 mg/dL 51/11/5850 7782   NITRITE Positive 02/12/2015 2217   LEUKOCYTESUR 3+ 02/12/2015 2217    Radiological Exams on Admission: CT ABDOMEN PELVIS WO CONTRAST  Result Date: 02/29/2020 CLINICAL DATA:  Abdominal pain, nausea and diarrhea. EXAM: CT ABDOMEN AND PELVIS WITHOUT CONTRAST TECHNIQUE: Multidetector CT imaging of the abdomen and pelvis was performed following the standard protocol without IV contrast. COMPARISON:  CT abdomen dated 09/12/2006. FINDINGS: Lower chest: Chronic scarring/atelectasis at each lung base. Hepatobiliary: Small layering stones within the otherwise normal-appearing gallbladder. No focal liver abnormality. No bile duct dilatation seen. Pancreas: Unremarkable. No pancreatic ductal dilatation or surrounding inflammatory changes. Spleen: Normal in size without focal abnormality. Adrenals/Urinary Tract: Chronic LEFT-sided hydronephrosis, severe in degree. Multiple dystrophic renal stones within the LEFT renal pelvis, largest measuring 1.4 cm. No RIGHT-sided hydronephrosis. Bladder appears normal. Several small calcifications within the LEFT lower pelvis, largest measuring 4 mm, of uncertain relationship to the distal LEFT ureter, but more likely vascular calcifications just outside of the distal LEFT ureter. No hydroureter. Stomach/Bowel: No dilated large or small bowel loops.  Scattered diverticulosis of the LEFT colon but no focal inflammatory change seen to suggest acute diverticulitis at this time. No bowel wall thickening, mesenteric fluid or other secondary signs of active bowel wall inflammation. Moderate amount of stool and gas throughout the colon. Stomach is unremarkable, partially decompressed. Appendix is not seen but there are no inflammatory changes about the cecum to suggest acute appendicitis. Vascular/Lymphatic: Aortic atherosclerosis. 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 Bilateral iliac stents in place. Reproductive: No adnexal mass or free fluid. Other: No free fluid or abscess collection identified. No free intraperitoneal air. Musculoskeletal: No  acute or suspicious osseous finding. Superficial soft tissues are unremarkable. Soft tissues of the upper thighs are unremarkable. IMPRESSION: 1. No acute findings within the abdomen or pelvis. No bowel obstruction or evidence of active bowel wall inflammation. No free fluid or inflammatory change. No evidence of acute solid organ abnormality. No renal or ureteral calculi. 2. Chronic LEFT-sided hydronephrosis, severe in degree, possibly congenital. Multiple LEFT renal stones, largest measuring 1.4 cm. No obstructing ureteral stone. 3. Colonic diverticulosis without evidence of acute diverticulitis at this time. 4. Cholelithiasis without evidence of acute cholecystitis. 5. 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 Aortic Atherosclerosis (ICD10-I70.0). Electronically Signed   By: Bary Richard M.D.   On: 02/29/2020 04:17    EKG: Independently reviewed.   Assessment/Plan Principal Problem:   GI bleed Active Problems:   Chronic systolic heart failure (HCC)   Tobacco use   Essential hypertension   Peripheral vascular disease (HCC)   Acute GI bleeding   Nicotine dependence    GI bleed Unclear etiology at this  time Patient presented for evaluation of abdominal pain, nausea and diarrhea. On presentation she had a white count of 22,000 and a hemoglobin of 14.6 g/dl and in 5 hours her hemoglobin dropped to 11.7g/dl and she is heme positive. We will monitor serial H&H We will request GI consult CT angiogram shows age-indeterminate occlusion of the distal aspect of the abdominal aorta caudal to the take-off of the IMA. There is complete occlusion of the bilateral common and external iliac arteries with no significant arterial flow to either lower extremity. High-grade (approximately 70%) narrowing involving the origin of the IMA with associated poststenotic dilatation. Focal approximately 50% luminal narrowing involving the mid aspect of the SMA approximately 3 cm peripheral to its origin. We will request vascular surgery consult Start patient on IV heparin and monitor H/H closely    Chronic systolic heart failure Last known LVEF 40 - 45% Hold lisinopril for now since patient is normotensive    Essential hypertension Blood pressure stable Hold lisinopril for now since patient is normotensive    Peripheral vascular disease Status post bilateral AKA Continue statins Hold aspirin due to concern for GI bleed   Nicotine dependence Patient states that 1 pack of cigarettes lasted 3 days Smoking cessation was discussed with her in detail She is unwilling to quit smoking at this time and declines a nicotine transdermal patch.   Leukocytosis/Diarrhea Concern for possible C. difficile diarrhea Patient denies any recent antibiotic use We will send stool for C. difficile toxin     4. Minimal amount of atherosclerotic plaque involves the origin the celiac artery, not resulting in a hemodynamically significant stenosis. 5. Cardiomegaly. 6.  Aortic Atherosclerosis (ICD10-I70.0).  DVT prophylaxis: Heparin Code Status: Full Family Communication: Greater than 50% of time was spent discussing  plan of care with patient at bedside.  All questions and concerns have been addressed.  She verbalizes understanding and agrees with the plan. Disposition Plan: Back to previous home environment Consults called: GI    Britiany Silbernagel MD [facility]     02/29/2020, 9:41 AM

## 2020-02-29 NOTE — ED Notes (Signed)
RN is waiting for Hydrocortisone Sodium Succinate to come from pharmacy. Once available RN will administer ASAP.

## 2020-02-29 NOTE — Consult Note (Signed)
ANTICOAGULATION CONSULT NOTE - Initial Consult  Pharmacy Consult for Heparin Infusion Indication: Aortic thrombus  Allergies  Allergen Reactions  . Contrast Media [Iodinated Diagnostic Agents] Rash  . Morphine And Related Diarrhea and Nausea And Vomiting    Patient Measurements: Weight: 47.2 kg (104 lb) Heparin Dosing Weight: 47.2 kg  Vital Signs: Temp: 97.7 F (36.5 C) (05/16 0330) Temp Source: Axillary (05/16 0330) BP: 129/66 (05/16 1441) Pulse Rate: 79 (05/16 1441)  Labs: Recent Labs    02/29/20 0139 02/29/20 0730  HGB 14.6 11.7*  HCT 46.3* 36.7  PLT 307 229  CREATININE 0.94  --     CrCl cannot be calculated (Unknown ideal weight.).   Medical History: Past Medical History:  Diagnosis Date  . Hypertension   . Peripheral vascular disease (HCC)   . Pneumonia 07/14/15  . Tobacco abuse   . Unilateral small kidney    a. one functional kidney    Medications:  No anticoagulation prior to admission per chart review  Assessment: Patient is a 74 y/o F with a medical history of tobacco abuse , peripheral vascular disease , bilateral AKA, unilateral small kidney who presented nausea , non-bloody diarrhea , and abdominal pain. There was concern for GIB with Hgb dopping from 14.6 >> 11.7 and hemoccult positive. However, GI saw patient and concluded no signs of active bleeding. Pharmacy has been consulted to initiate heparin infusion for aortic thrombus identified on imaging.   Hgb 14.6 >> 11.7, Plt 307 >> 229. Could reflect hemodilution with IV fluids. Baseline aPTT and PT-INR pending.   Goal of Therapy:  Heparin level 0.3-0.7 units/ml Monitor platelets by anticoagulation protocol: Yes   Plan:  -Heparin 2000 unit bolus IV x 1 followed by continuous infusion at 750 units/hr -Heparin level in 8 hours -Daily CBC per protocol  Tressie Ellis  Pharmacy Resident 02/29/2020,3:02 PM

## 2020-02-29 NOTE — ED Notes (Signed)
Pt back from CT

## 2020-02-29 NOTE — ED Notes (Signed)
Patient transported to CT at this time. 

## 2020-02-29 NOTE — Consult Note (Signed)
[facility] Vascular Consult Note  MRN : [id number]  [name] is a 74 y.o. ([birth year]) female who presents with chief complaint of Abdominal and Right Leg pain Chief Complaint  Patient presents with  . Diarrhea  .  History of Present Illness: Patient known to service. Severe AortoIliac and peripheral Vascular Disease- Multiple bilateral interventions including Iliac stenting and bypasses- now with bilateral AKAs. She presents with a diffuse body rash, abdominal and right leg pain and diarrhea. There are varying stories from the patient and her daughter at this juncture regarding the patients symptoms- The patient states she had a CTA on 5/10 for carotid stenosis. She subsequently developed mild abdominal pain that began on 5/11-/512 with diarrhea and worsening abdominal pain last night. She states the diarrhea resolved along with her abdominal pain. In addition she states she has phantom pain that is persistent. However, her phantom pain, along with hip pain began last night also- prompting her to come to the ED. Per the patients daughter- the pain was localized to the hip and abdomen. Neither report bloody stool, vomiting. The patient states much of her RIGHT leg pain is posterior and at the end of the stump.  Current Facility-Administered Medications  Medication Dose Route Frequency Provider Last Rate Last Admin  . 0.9 %  sodium chloride infusion   Intravenous Continuous Agbata, Tochukwu, MD 100 mL/hr at 02/29/20 0924 New Bag at 02/29/20 0924  . atorvastatin (LIPITOR) tablet 40 mg  40 mg Oral QHS Agbata, Tochukwu, MD      . cholecalciferol (VITAMIN D3) tablet 1,000 Units  1,000 Units Oral Daily Agbata, Tochukwu, MD   1,000 Units at 02/29/20 1124  . HYDROmorphone (DILAUDID) injection 1 mg  1 mg Intravenous Q4H PRN Agbata, Tochukwu, MD   1 mg at 02/29/20 1125  . ondansetron (ZOFRAN) tablet 4 mg  4 mg Oral Q6H PRN Agbata, Tochukwu, MD       Or  . ondansetron  (ZOFRAN) injection 4 mg  4 mg Intravenous Q6H PRN Agbata, Tochukwu, MD      . pantoprazole (PROTONIX) 80 mg in sodium chloride 0.9 % 100 mL (0.8 mg/mL) infusion  8 mg/hr Intravenous Continuous Willy Eddy, MD 10 mL/hr at 02/29/20 0928 8 mg/hr at 02/29/20 2725   Current Outpatient Medications  Medication Sig Dispense Refill  . aspirin 81 MG tablet Take 81 mg by mouth every morning.     Marland Kitchen atorvastatin (LIPITOR) 40 MG tablet Take 1 tablet (40 mg total) by mouth at bedtime. 30 tablet 5  . cholecalciferol (VITAMIN D3) 25 MCG (1000 UNIT) tablet Take 1,000 Units by mouth daily.    . famotidine (PEPCID) 20 MG tablet Take 1 tablet (20 mg total) by mouth every morning. 30 tablet 5  . HYDROcodone-acetaminophen (NORCO/VICODIN) 5-325 MG tablet Take 1 tablet by mouth every 6 (six) hours as needed for moderate pain. 65 tablet 0  . lisinopril (ZESTRIL) 20 MG tablet Take 1 tablet (20 mg total) by mouth daily. 30 tablet 5  . magnesium hydroxide (MILK OF MAGNESIA) 400 MG/5ML suspension Take 30 mLs by mouth daily as needed for mild constipation.    . traMADol (ULTRAM) 50 MG tablet Take 50 mg by mouth as needed.    . ergocalciferol (DRISDOL) 50000 units capsule Take 1 capsule (50,000 Units total) by mouth once a week. (Patient not taking: Reported on 02/12/2020) 4 capsule 5    Past Medical History:  Diagnosis Date  . Hypertension   . Peripheral vascular  disease ([place])   . Pneumonia 07/14/15  . Tobacco abuse   . Unilateral small kidney    a. one functional kidney    Past Surgical History:  Procedure Laterality Date  . ABOVE KNEE LEG AMPUTATION Right 01/2015  . AMPUTATION Left 07/16/2015   Procedure: AMPUTATION ABOVE KNEE;  Surgeon: Katha Cabal, MD;  Location: [facility] OR[facility];  Service: Vascular;  Laterality: Left;  . AMPUTATION Left 07/23/2015   Procedure:   WOUND CLOSURE;  Surgeon: Katha Cabal, MD;  Location: [facility];  Service: Vascular;  Laterality: Left;  . AMPUTATION Left 11/19/2015   Procedure:   ( [id number] ) AMPUTATION ABOVE KNEE ;  Surgeon: Katha Cabal, MD;  Location: [facility] OR[facility];  Service: Vascular;  Laterality: Left;  . APPENDECTOMY  1969  . BREAST BIOPSY Right 1970   neg  . ECTOPIC PREGNANCY SURGERY Right 1969  . FOREIGN BODY REMOVAL Left 07/28/2016   Procedure: FOREIGN BODY REMOVAL ADULT ( LEG );  Surgeon: Katha Cabal, MD;  Location: [facility];  Service: Vascular;  Laterality: Left;  . PERIPHERAL VASCULAR CATHETERIZATION N/A 07/22/2015   Procedure: Abdominal Aortogram w/Lower Extremity;  Surgeon: Algernon Huxley, MD;  Location: [place] [facility];  Service: Cardiovascular;  Laterality: N/A;  . PERIPHERAL VASCULAR CATHETERIZATION  07/22/2015   Procedure: Lower Extremity Intervention;  Surgeon: Algernon Huxley, MD;  Location: [place] [facility];  Service: Cardiovascular;;  . stents left leg      Social History Social History   Tobacco Use  . Smoking status: Light Tobacco Smoker    Packs/day: 0.25    Years: 55.00    Pack years: 13.75    Last attempt to quit: 01/20/2015    Years since quitting: 5.1  . Smokeless tobacco: Never Used  Substance Use Topics  . Alcohol use: No  . Drug use: No    Family History Family History  Problem Relation Age of Onset  . CAD Mother   . Hypertension Mother   . Stroke Mother   . CAD Father   . Hypertension Father   . Breast cancer Neg Hx     Allergies  Allergen Reactions  . Contrast Media [Iodinated Diagnostic Agents] Rash  . Morphine And Related Diarrhea and Nausea And Vomiting     REVIEW OF SYSTEMS (Negative unless checked)  Constitutional: [] Weight loss  [] Fever  [] Chills Cardiac: [] Chest pain   [] Chest pressure   [] Palpitations   [] Shortness of breath when laying flat   [] Shortness of breath at rest   [] Shortness of breath with exertion. Vascular:  [] Pain in legs with walking   [x] Pain in legs at rest   [] Pain in legs when laying flat   [] Claudication   [] Pain in feet when walking  [] Pain in feet at rest  [] Pain in feet  when laying flat   [] History of DVT   [] Phlebitis   [] Swelling in legs   [] Varicose veins   [] Non-healing ulcers Pulmonary:   [] Uses home oxygen   [] Productive cough   [] Hemoptysis   [] Wheeze  [] COPD   [] Asthma Neurologic:  [] Dizziness  [] Blackouts   [] Seizures   [] History of stroke   [] History of TIA  [] Aphasia   [] Temporary blindness   [] Dysphagia   [] Weakness or numbness in arms   [] Weakness or numbness in legs Musculoskeletal:  [] Arthritis   [] Joint swelling   [] Joint pain   [] Low back pain Hematologic:  [] Easy bruising  [] Easy bleeding   [] Hypercoagulable state   [] Anemic  []   Hepatitis Gastrointestinal:  [x] Blood in stool   [] Vomiting blood  [] Gastroesophageal reflux/heartburn   [] Difficulty swallowing. Genitourinary:  [] Chronic kidney disease   [] Difficult urination  [] Frequent urination  [] Burning with urination   [] Blood in urine Skin:  [x] Rashes   [] Ulcers   [] Wounds Psychological:  [] History of anxiety   []  History of major depression.  Physical Examination  Vitals:   02/29/20 1600 02/29/20 1630 02/29/20 1700 02/29/20 1800  BP: 139/61 133/60 (!) 146/79 (!) 144/68  Pulse: 72 67 81 71  Resp: 16 12 18 17   Temp:      TempSrc:      SpO2: 97% 97% 100% 96%  Weight:       Body mass index is 31.74 kg/m. Gen:  WD/WN, NAD Head: [place]/AT, No temporalis wasting. Prominent temp pulse not noted. Ear/Nose/Throat: Hearing grossly intact, nares w/o erythema or drainage, oropharynx w/o Erythema/Exudate Eyes: Sclera non-icteric, conjunctiva clear Neck: Trachea midline.  No JVD.  Pulmonary:  Good air movement, respirations not labored, equal bilaterally.  Cardiac: RRR, normal S1, S2. Vascular:  Vessel Right Left  Radial Palpable Palpable  Ulnar Palpable Palpable  Brachial Palpable Palpable  Carotid Palpable, without bruit Palpable, without bruit  Aorta Not palpable N/A  Gastrointestinal: soft, non-tender/non-distended. No guarding/reflex.  Musculoskeletal: M/S 5/5 throughout. Bilateral AKA  stumps warm, pink, well perfused without evidence of ischemia. Non tender.   No deformity or atrophy. No edema. Neurologic: Sensation grossly intact in extremities.  Symmetrical.  Speech is fluent. Motor exam as listed above. Psychiatric: Judgment intact, Mood & affect appropriate for pt's clinical situation. Dermatologic: diffuse rash over chest abdomen and legs or ulcers noted.  No cellulitis or open wounds. Lymph : No Cervical, Axillary, or Inguinal lymphadenopathy.      CBC Lab Results  Component Value Date   WBC 23.0 (H) 02/29/2020   HGB 11.1 (L) 02/29/2020   HCT 35.2 (L) 02/29/2020   MCV 91.5 02/29/2020   PLT 229 02/29/2020    BMET    Component Value Date/Time   NA 139 02/29/2020 0139   NA 131 (L) 02/13/2015 0426   K 3.8 02/29/2020 0139   K 3.0 (L) 02/13/2015 0426   CL 107 02/29/2020 0139   CL 103 02/13/2015 0426   CO2 20 (L) 02/29/2020 0139   CO2 23 02/13/2015 0426   GLUCOSE 166 (H) 02/29/2020 0139   GLUCOSE 104 (H) 02/13/2015 0426   BUN 16 02/29/2020 0139   BUN 10 02/13/2015 0426   CREATININE 0.94 02/29/2020 0139   CREATININE 0.83 02/13/2015 0426   CALCIUM 9.2 02/29/2020 0139   CALCIUM 7.3 (L) 02/13/2015 0426   GFRNONAA 60 (L) 02/29/2020 0139   GFRNONAA >60 02/13/2015 0426   GFRAA >60 02/29/2020 0139   GFRAA >60 02/13/2015 0426   CrCl cannot be calculated (Unknown ideal weight.).  COAG Lab Results  Component Value Date   INR 1.1 02/29/2020   INR 1.36 07/28/2016   INR 2.6 07/24/2016    Radiology CT ABDOMEN PELVIS WO CONTRAST  Result Date: 02/29/2020 CLINICAL DATA:  Abdominal pain, nausea and diarrhea. EXAM: CT ABDOMEN AND PELVIS WITHOUT CONTRAST TECHNIQUE: Multidetector CT imaging of the abdomen and pelvis was performed following the standard protocol without IV contrast. COMPARISON:  CT abdomen dated 09/12/2006. FINDINGS: Lower chest: Chronic scarring/atelectasis at each lung base. Hepatobiliary: Small layering stones within the otherwise  normal-appearing gallbladder. No focal liver abnormality. No bile duct dilatation seen. Pancreas: Unremarkable. No pancreatic ductal dilatation or surrounding inflammatory changes. Spleen: Normal in size without  focal abnormality. Adrenals/Urinary Tract: Chronic LEFT-sided hydronephrosis, severe in degree. Multiple dystrophic renal stones within the LEFT renal pelvis, largest measuring 1.4 cm. No RIGHT-sided hydronephrosis. Bladder appears normal. Several small calcifications within the LEFT lower pelvis, largest measuring 4 mm, of uncertain relationship to the distal LEFT ureter, but more likely vascular calcifications just outside of the distal LEFT ureter. No hydroureter. Stomach/Bowel: No dilated large or small bowel loops. Scattered diverticulosis of the LEFT colon but no focal inflammatory change seen to suggest acute diverticulitis at this time. No bowel wall thickening, mesenteric fluid or other secondary signs of active bowel wall inflammation. Moderate amount of stool and gas throughout the colon. Stomach is unremarkable, partially decompressed. Appendix is not seen but there are no inflammatory changes about the cecum to suggest acute appendicitis. Vascular/Lymphatic: Aortic atherosclerosis. 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 Bilateral iliac stents in place. Reproductive: No adnexal mass or free fluid. Other: No free fluid or abscess collection identified. No free intraperitoneal air. Musculoskeletal: No acute or suspicious osseous finding. Superficial soft tissues are unremarkable. Soft tissues of the upper thighs are unremarkable. IMPRESSION: 1. No acute findings within the abdomen or pelvis. No bowel obstruction or evidence of active bowel wall inflammation. No free fluid or inflammatory change. No evidence of acute solid organ abnormality. No renal or ureteral calculi. 2. Chronic LEFT-sided hydronephrosis, severe in degree, possibly congenital.  Multiple LEFT renal stones, largest measuring 1.4 cm. No obstructing ureteral stone. 3. Colonic diverticulosis without evidence of acute diverticulitis at this time. 4. Cholelithiasis without evidence of acute cholecystitis. 5. 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 Aortic Atherosclerosis (ICD10-I70.0). Electronically Signed   By: Bary Richard M.D.   On: 02/29/2020 04:17   CT ANGIO NECK W OR WO CONTRAST  Result Date: 02/23/2020 CLINICAL DATA:  Bilateral carotid artery stenosis EXAM: CT ANGIOGRAPHY NECK TECHNIQUE: Multidetector CT imaging of the neck was performed using the standard protocol during bolus administration of intravenous contrast. Multiplanar CT image reconstructions and MIPs were obtained to evaluate the vascular anatomy. Carotid stenosis measurements (when applicable) are obtained utilizing NASCET criteria, using the distal internal carotid diameter as the denominator. CONTRAST:  66mL OMNIPAQUE IOHEXOL 350 MG/ML SOLN COMPARISON:  None. FINDINGS: Aortic arch: Great vessel origins are patent. Right carotid system: Patent. Common carotid demonstrates atherosclerotic wall thickening and mild calcified plaque. There is noncalcified plaque at the proximal ICA causing approximately 60% stenosis. Remainder of the cervical ICA is patent. There is moderate stenosis at the external carotid origin. Left carotid system: Patent. Proximal common carotid is poorly evaluated due to streak artifact from adjacent venous contrast. There is tandem primarily noncalcified plaque at the ICA origin causing approximately 50% stenosis. Vertebral arteries: Patent and codominant. There is plaque near the origins with mild stenosis. Minimal calcified plaque along the V2 segments. Skeleton: Multilevel cervical spine degenerative changes. Other neck: Small subcentimeter thyroid nodules for which no further follow-up is recommended. Upper chest: Advanced emphysema. IMPRESSION:  Plaque at the proximal right ICA causing approximately 60% stenosis. Tandem plaque at the proximal left ICA causing approximately 50% stenosis. Electronically Signed   By: Guadlupe Spanish M.D.   On: 02/23/2020 13:07   CT Angio Abd/Pel W and/or Wo Contrast  Result Date: 02/29/2020 CLINICAL DATA:  Severe abdominal pain. Evaluate ischemia. For mesenteric EXAM: CTA ABDOMEN AND PELVIS WITHOUT AND WITH CONTRAST TECHNIQUE: Multidetector CT imaging of the abdomen and pelvis was performed using the standard protocol during  bolus administration of intravenous contrast. Multiplanar reconstructed images and MIPs were obtained and reviewed to evaluate the vascular anatomy. CONTRAST:  53mL OMNIPAQUE IOHEXOL 350 MG/ML SOLN History contrast allergy; underwent emergency steroid prep and received contrast without incident. COMPARISON:  CT abdomen pelvis - earlier same day; 09/12/2006 FINDINGS: VASCULAR Aorta: There is a large amount of circumferential predominantly noncalcified atherosclerotic plaque throughout the abdominal aorta which ultimately occludes at the level of the takeoff of the IMA. No associated perivascular stranding. Celiac: There is a moderate amount of eccentric mixed calcified and noncalcified atherosclerotic plaque involving the origin the celiac artery, not resulting in a hemodynamically significant stenosis. Conventional branching pattern. SMA: There is a moderate to large amount of eccentric mixed calcified and noncalcified atherosclerotic plaque involving the origin and main trunk of the SMA which results in approximately 50% luminal narrowing approximately 3 cm distal to the origin (axial image 37, series 4; sagittal image 80, series 9). The distal tributaries of the SMA appear patent without discrete intraluminal filling defect with special attention paid to the middle and left colic branches. Renals: The right renal artery is duplicated; there is a tiny accessory right renal artery which supplies the  inferior pole the right kidney. The dominant right renal artery is widely patent without hemodynamically significant stenosis. The left renal artery is diffusely atrophic likely secondary to left renal atrophy due to chronic left-sided UPJ obstruction/stenosis. No discrete areas of vessel irregularity to suggest FMD. IMA: Diseased at its origin with focal severe (approximately 70%) luminal narrowing (image 66, series 4), with associated poststenotic dilatation. The distal tributaries of the IMA appear patent without discrete intraluminal filling defect. Inflow: As above, the distal aspect of the abdominal aorta is occluded caudal to the take-off of the IMA. Patient has undergone previous overlapping stenting of the bilateral common and external iliac arteries however both of the common and external iliac arteries are occluded throughout their courses. There is minimal atretic recanalization of the distal aspects of the bilateral internal iliac arteries likely via trans pelvic arterial collaterals. Proximal Outflow: There is no appreciable arterial flow to either lower extremity. Veins: The IVC and pelvic venous systems appear widely patent. Review of the MIP images confirms the above findings. _________________________________________________________ NON-VASCULAR Lower chest: Limited visualization of lower thorax demonstrates advanced emphysematous change with bibasilar subsegmental atelectasis and mild bibasilar bronchiectasis. No discrete focal airspace opacities. No pleural effusion. Cardiomegaly.  No pericardial effusion. Hepatobiliary: Normal hepatic contour. No discrete hepatic lesions. Several layering gallstones are seen within otherwise normal-appearing gallbladder. No intra or extrahepatic biliary ductal dilatation. No ascites. Pancreas: Normal appearance of the pancreas. Spleen: Normal appearance of the spleen. Adrenals/Urinary Tract: There is symmetric enhancement of the bilateral kidneys. Chronic  left-sided UPJ narrowing/obstruction with associated upstream marked pelvicaliectasis and asymmetric left renal atrophy and compensatory hypertrophy of the right kidney, progressed compared to remote examination performed in 2007. Several layering nonobstructing stones are seen within dilated inferior pole calices with dominant left-sided renal stone measuring approximately 1.5 x 0.7 x 0.7 cm (axial image 63, series 6; coronal image 56, series 10). Potential punctate (approximately 2 mm) nonobstructing right-sided renal stone. Additional calcifications about the right renal hila are favored to be vascular in etiology. No evidence of right-sided urinary obstruction. Normal appearance of the bilateral adrenal glands. Normal appearance of the urinary bladder given degree distention. Stomach/Bowel: Evaluation of intestines is degraded secondary to lack of significant mesenteric fat as well as enteric contrast. Colonic diverticulosis without evidence superimposed acute diverticulitis. Circumferential wall  thickening involving the distal aspect of the transverse colon extending to involve the mid descending colon (axial images 25, 35, 48, 51, 67, series 6; coronal images 37, 49 and 56, series 10). No evidence of enteric obstruction. No pneumoperitoneum, pneumatosis or portal venous gas. Lymphatic: No definitive bulky retroperitoneal, mesenteric or pelvic lymphadenopathy. Reproductive: Dystrophic calcification of the left adnexa. Otherwise, normal appearance of the pelvic organs for age. No free fluid the pelvic cul-de-sac. Other: Mild diffuse body wall anasarca. Musculoskeletal: No acute or aggressive osseous abnormalities. Mild to moderate multilevel lumbar spine DDD, worse at L5-S1 with disc space height loss, endplate irregularity and sclerosis. Old mild (approximately 25%) compression deformity involving the superior endplate of the L3 vertebral body (sagittal image 72, series 9, without associated acute fracture line  or paraspinal hematoma. IMPRESSION: VASCULAR 1. Age-indeterminate occlusion of the distal aspect of the abdominal aorta caudal to the take-off of the IMA. There is complete occlusion of the bilateral common and external iliac arteries with no significant arterial flow to either lower extremity. 2. High-grade (approximately 70%) narrowing involving the origin of the IMA with associated poststenotic dilatation. 3. Focal approximately 50% luminal narrowing involving the mid aspect of the SMA approximately 3 cm peripheral to its origin. 4. Minimal amount of atherosclerotic plaque involves the origin the celiac artery, not resulting in a hemodynamically significant stenosis. 5. Cardiomegaly. 6.  Aortic Atherosclerosis (ICD10-I70.0). NON-VASCULAR 1. Apparent circumferential wall thickening involving the distal half of the transverse colon extending to involve the descending colon without of enteric obstruction or definable/drainable fluid collection. Findings are nonspecific though could be seen in the setting of an enteritis potentially of ischemic (favored, given associated vasculopathy), infectious or inflammatory etiology. Currently there is no evidence enteric perforation, pneumatosis or portal venous gas. Correlation with lactic acid level and/or colonoscopy could be performed as indicated. 2. Colonic diverticulosis without evidence superimposed acute diverticulitis. 3. 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 4. Nonobstructing bilateral nephrolithiasis, left greater than right. 5. Cholelithiasis without evidence cholecystitis. Critical Value/emergent results were called by telephone at the time of interpretation on 02/29/2020 at 2:32 pm to provider Willy Eddy , who verbally acknowledged these results. Electronically Signed   By: Simonne Come M.D.   On: 02/29/2020 14:36      Assessment/Plan  RIGHT Stump pain- Appears to be phantom pain. No clear  etiology of reported hip pain. 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  Abdominal Pain- AortoIliac Occlusion is chronic appearing. Mesenteric vessels IMA-70%; SMA 50%; Celiac-minimal also appear chronic. Lactate is normal; WBC is elevated- may have colitis. Etiology not clear. Serial labs and exams.  In the presence of possible GI bleed and no acute process would hold off on Heparin gtt for now.   Will consider mesenteric angiogram if no improvement in overall status. Will follow closely.  Continue IV fluids   Bertram Denver, MD  02/29/2020 6:04 PM    This note was created with Dragon medical transcription system.  Any error is purely unintentional

## 2020-02-29 NOTE — ED Provider Notes (Signed)
[facility] Emergency Department Provider Note  ____________________________________________   First MD Initiated Contact with Patient 02/29/20 0136     (approximate)  I have reviewed the triage vital signs and the nursing notes.   HISTORY  Chief Complaint Diarrhea    HPI [name] is a 74 y.o. female with below list of previous medical conditions including peripheral vascular disease status post bilateral above-the-knee amputation presents to the emergency department due to acute onset of nausea and nonbloody nonmelanotic diarrhea x2.  Patient does admit to abdominal discomfort with radiation to her right leg.  In addition patient admits to a generalized nonpruritic rash which she states she has had since her CT with contrast that was performed 02/23/2020.  Patient states that current pain score is 10 out of 10.  Patient denies any aggravating or alleviating factors to her pain.       Past Medical History:  Diagnosis Date  . Hypertension   . Peripheral vascular disease (HCC)   . Pneumonia 07/14/15  . Tobacco abuse   . 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 Carotid atherosclerosis, bilateral 07/06/2019  . Left carotid bruit 09/30/2018  . Chronic pain of both lower extremities 03/11/2018  . Gastroesophageal reflux disease without esophagitis 03/11/2018  . Adjustment insomnia 03/11/2018  . Vitamin D deficiency 03/11/2018  . Carotid stenosis 03/05/2017  . Status post above-knee amputation (HCC) 07/27/2016  . Atherosclerosis of abdominal aorta (HCC) 07/27/2016  . Peripheral vascular disease (HCC) 07/27/2016  . Amputation of lower extremity above knee with complication (HCC) 11/19/2015  . Encounter for general adult medical examination with abnormal findings 11/15/2015  . Essential hypertension 11/15/2015  . Chronic systolic heart failure (HCC) 08/20/2015  . Tobacco use  08/20/2015  . Arterial occlusion 07/14/2015  . Pressure ulcer 07/14/2015  . Acute respiratory failure (HCC) 07/14/2015  . Pneumonia 02/13/2015    Past Surgical History:  Procedure Laterality Date  . ABOVE KNEE LEG AMPUTATION Right 01/2015  . AMPUTATION Left 07/16/2015   Procedure: AMPUTATION ABOVE KNEE;  Surgeon: Renford Dills, MD;  Location: [facility] ORS;  Service: Vascular;  Laterality: Left;  . AMPUTATION Left 07/23/2015   Procedure:   WOUND CLOSURE;  Surgeon: Renford Dills, MD;  Location: [facility] OR[facility];  Service: Vascular;  Laterality: Left;  . AMPUTATION Left 11/19/2015   Procedure:  ( REVISION ) AMPUTATION ABOVE KNEE ;  Surgeon: Renford Dills, MD;  Location: [facility] OR[facility];  Service: Vascular;  Laterality: Left;  . APPENDECTOMY  1969  . BREAST BIOPSY Right 1970   neg  . ECTOPIC PREGNANCY SURGERY Right 1969  . FOREIGN BODY REMOVAL Left 07/28/2016   Procedure: FOREIGN BODY REMOVAL ADULT ( LEG );  Surgeon: Renford Dills, MD;  Location: [facility] OR[facility];  Service: Vascular;  Laterality: Left;  . PERIPHERAL VASCULAR CATHETERIZATION N/A 07/22/2015   Procedure: Abdominal Aortogram w/Lower Extremity;  Surgeon: Annice Needy, MD;  Location: [facility];  Service: Cardiovascular;  Laterality: N/A;  . PERIPHERAL VASCULAR CATHETERIZATION  07/22/2015   Procedure: Lower Extremity Intervention;  Surgeon: Annice Needy, MD;  Location: [facility];  Service: Cardiovascular;;  . stents left leg      Prior to Admission medications   Medication Sig Start Date End Date Taking? Authorizing Provider  aspirin 81 MG tablet Take 81 mg by mouth every morning.    Yes [provider]  atorvastatin (LIPITOR) 40 MG tablet Take 1 tablet (40 mg total) by mouth at bedtime. 01/20/20  Yes Boscia, Kathlynn Grate, NP  cholecalciferol (VITAMIN D3) 25 MCG (1000 UNIT) tablet Take 1,000 Units by mouth daily.   Yes [provider]  famotidine (PEPCID) 20 MG tablet Take 1 tablet (20 mg total) by mouth every  morning. 12/15/19  Yes Boscia, Kathlynn Grate, NP  HYDROcodone-acetaminophen (NORCO/VICODIN) 5-325 MG tablet Take 1 tablet by mouth every 6 (six) hours as needed for moderate pain. 12/29/19  Yes Boscia, Heather E, NP  lisinopril (ZESTRIL) 20 MG tablet Take 1 tablet (20 mg total) by mouth daily. 12/15/19  Yes Boscia, Kathlynn Grate, NP  magnesium hydroxide (MILK OF MAGNESIA) 400 MG/5ML suspension Take 30 mLs by mouth daily as needed for mild constipation.   Yes [provider]  traMADol (ULTRAM) 50 MG tablet Take 50 mg by mouth as needed.   Yes Geoffery Lyons, PA  ergocalciferol (DRISDOL) 50000 units capsule Take 1 capsule (50,000 Units total) by mouth once a week. Patient not taking: Reported on 02/12/2020 05/29/18   Carlean Jews, NP    Allergies Contrast media [iodinated diagnostic agents] and Morphine and related  Family History  Problem Relation Age of Onset  . CAD Mother   . Hypertension Mother   . Stroke Mother   . CAD Father   . Hypertension Father   . Breast cancer Neg Hx     Social History Social History   Tobacco Use  . Smoking status: Light Tobacco Smoker    Packs/day: 0.25    Years: 55.00    Pack years: 13.75    Last attempt to quit: 01/20/2015    Years since quitting: 5.1  . Smokeless tobacco: Never Used  Substance Use Topics  . Alcohol use: No  . Drug use: No    Review of Systems Constitutional: No fever/chills Eyes: No visual changes. ENT: No sore throat. Cardiovascular: Denies chest pain. Respiratory: Denies shortness of breath. Gastrointestinal: Positive for abdominal pain and diarrhea Genitourinary: Negative for dysuria. Musculoskeletal: Negative for neck pain.  Negative for back pain. Integumentary: Negative for rash. Neurological: Negative for headaches, focal weakness or numbness.  ____________________________________________   PHYSICAL EXAM:  VITAL SIGNS: ED Triage Vitals  Enc Vitals Group     BP 02/29/20 0133 140/82     Pulse Rate 02/29/20  0133 (!) 112     Resp 02/29/20 0133 20     Temp 02/29/20 0330 97.7 F (36.5 C)     Temp Source 02/29/20 0330 Axillary     SpO2 02/29/20 0133 100 %     Weight 02/29/20 0134 47.2 kg (104 lb)     Height --      Head Circumference --      Peak Flow --      Pain Score 02/29/20 0134 0     Pain Loc --      Pain Edu? --      Excl. in GC? --     Constitutional: Alert and oriented.  Apparent discomfort Eyes: Conjunctivae are normal.  Mouth/Throat: Patient is wearing a mask. Neck: No stridor.  No meningeal signs.   Cardiovascular: Normal rate, regular rhythm. Good peripheral circulation. Grossly normal heart sounds. Respiratory: Normal respiratory effort.  No retractions. Gastrointestinal: Soft and nontender. No distention.  Musculoskeletal: No lower extremity tenderness nor edema. No gross deformities of extremities. Neurologic:  Normal speech and language. No gross focal neurologic deficits are appreciated.  Skin:  Skin is warm, dry and intact. Psychiatric: Mood and affect are normal. Speech and behavior are normal.  ____________________________________________   LABS (all labs ordered are listed, but only abnormal results are displayed)  Labs Reviewed  CBC - Abnormal; Notable for the following components:      Result Value   WBC 22.0 (*)    HCT 46.3 (*)    All other components within normal limits  COMPREHENSIVE METABOLIC PANEL - Abnormal; Notable for the following components:   CO2 20 (*)    Glucose, Bld 166 (*)    GFR calc non Af Amer 60 (*)    All other components within normal limits  GASTROINTESTINAL PANEL BY PCR, STOOL (REPLACES STOOL CULTURE)  C DIFFICILE QUICK SCREEN W PCR REFLEX  CULTURE, BLOOD (ROUTINE X 2)  CULTURE, BLOOD (ROUTINE X 2)  URINALYSIS, COMPLETE (UACMP) WITH MICROSCOPIC  PROTIME-INR  CBC  LACTIC ACID, PLASMA  LACTIC ACID, PLASMA     RADIOLOGY I, Adrian N Mari Battaglia, personally viewed and evaluated these images (plain radiographs) as part of my  medical decision making, as well as reviewing the written report by the radiologist.  ED MD interpretation:    Official radiology report(s): CT ABDOMEN PELVIS WO CONTRAST  Result Date: 02/29/2020 CLINICAL DATA:  Abdominal pain, nausea and diarrhea. EXAM: CT ABDOMEN AND PELVIS WITHOUT CONTRAST TECHNIQUE: Multidetector CT imaging of the abdomen and pelvis was performed following the standard protocol without IV contrast. COMPARISON:  CT abdomen dated 09/12/2006. FINDINGS: Lower chest: Chronic scarring/atelectasis at each lung base. Hepatobiliary: Small layering stones within the otherwise normal-appearing gallbladder. No focal liver abnormality. No bile duct dilatation seen. Pancreas: Unremarkable. No pancreatic ductal dilatation or surrounding inflammatory changes. Spleen: Normal in size without focal abnormality. Adrenals/Urinary Tract: Chronic LEFT-sided hydronephrosis, severe in degree. Multiple dystrophic renal stones within the LEFT renal pelvis, largest measuring 1.4 cm. No RIGHT-sided hydronephrosis. Bladder appears normal. Several small calcifications within the LEFT lower pelvis, largest measuring 4 mm, of uncertain relationship to the distal LEFT ureter, but more likely vascular calcifications just outside of the distal LEFT ureter. No hydroureter. Stomach/Bowel: No dilated large or small bowel loops. Scattered diverticulosis of the LEFT colon but no focal inflammatory change seen to suggest acute diverticulitis at this time. No bowel wall thickening, mesenteric fluid or other secondary signs of active bowel wall inflammation. Moderate amount of stool and gas throughout the colon. Stomach is unremarkable, partially decompressed. Appendix is not seen but there are no inflammatory changes about the cecum to suggest acute appendicitis. Vascular/Lymphatic: Aortic atherosclerosis. 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 Bilateral iliac stents in place.  Reproductive: No adnexal mass or free fluid. Other: No free fluid or abscess collection identified. No free intraperitoneal air. Musculoskeletal: No acute or suspicious osseous finding. Superficial soft tissues are unremarkable. Soft tissues of the upper thighs are unremarkable. IMPRESSION: 1. No acute findings within the abdomen or pelvis. No bowel obstruction or evidence of active bowel wall inflammation. No free fluid or inflammatory change. No evidence of acute solid organ abnormality. No renal or ureteral calculi. 2. Chronic LEFT-sided hydronephrosis, severe in degree, possibly congenital. Multiple LEFT renal stones, largest measuring 1.4 cm. No obstructing ureteral stone. 3. Colonic diverticulosis without evidence of acute diverticulitis at this time. 4. Cholelithiasis without evidence of acute cholecystitis. 5. 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 Aortic Atherosclerosis (ICD10-I70.0). Electronically Signed   By: Bary Richard M.D.   On: 02/29/2020 04:17  Procedures   ____________________________________________   INITIAL IMPRESSION / MDM / ASSESSMENT AND PLAN / ED COURSE  As part of my medical decision making, I reviewed the following data within the [facility] NUMBER  74 year old female presented with above-stated history and physical exam a differential diagnosis including but not limited to mesenteric ischemia, infectious diarrhea versus other potential intra-abdominal pathology.  Laboratory data notable for a white blood cell count of 22.  Patient did not have any further episodes of diarrhea while in the emergency department and as such stool sample was not obtained.  Patient initially received fentanyl 50 mcg with minimal improvement in pain and as such patient was given Dilaudid 0.5 mg x 2 doses with improvement in her pain.  Patient is currently pain-free.  Due to inability to perform CT with contrast secondary to patient's  recent contrast reaction patient was discussed with Dr. Francoise Ceo radiologist regarding the possibility of doing CT without contrast versus MRI.  Dr. Francoise Ceo recommended CT without contrast which was performed.  CT revealed no evidence of bowel ischemia or any other gross pathology.  Blood cultures obtained lactic acid pending.  Plan admit the patient for possible premedication and subsequent CT angiogram as well as vascular surgery consultation.  ____________________________________________  FINAL CLINICAL IMPRESSION(S) / ED DIAGNOSES  Final diagnoses:  Generalized abdominal pain  Diarrhea   MEDICATIONS GIVEN DURING THIS VISIT:  Medications  iohexol (OMNIPAQUE) 350 MG/ML injection 75 mL (has no administration in time range)  ondansetron (ZOFRAN) injection 4 mg (4 mg Intravenous Given 02/29/20 0217)  fentaNYL (SUBLIMAZE) injection 50 mcg (50 mcg Intravenous Given 02/29/20 0214)  sodium chloride 0.9 % bolus 1,000 mL (0 mLs Intravenous Stopped 02/29/20 0339)  HYDROmorphone (DILAUDID) injection 0.5 mg (0.5 mg Intravenous Given 02/29/20 0242)  HYDROmorphone (DILAUDID) injection 1 mg (1 mg Intravenous Given 02/29/20 0352)     ED Discharge Orders    None      *Please note:  [name] was evaluated in Emergency Department on 02/29/2020 for the symptoms described in the history of present illness. She was evaluated in the context of the global COVID-19 pandemic, which necessitated consideration that the patient might be at risk for infection with the SARS-CoV-2 virus that causes COVID-19. Institutional protocols and algorithms that pertain to the evaluation of patients at risk for COVID-19 are in a state of rapid change based on information released by regulatory bodies including the CDC and federal and state organizations. These policies and algorithms were followed during the patient's care in the ED.  Some ED evaluations and interventions may be delayed as a result of limited staffing  during the pandemic.*  Note:  This document was prepared using Dragon voice recognition software and may include unintentional dictation errors.   Gregor Hams, MD 02/29/20 2134

## 2020-03-01 ENCOUNTER — Other Ambulatory Visit (INDEPENDENT_AMBULATORY_CARE_PROVIDER_SITE_OTHER): Payer: Self-pay | Admitting: Vascular Surgery

## 2020-03-01 DIAGNOSIS — I739 Peripheral vascular disease, unspecified: Secondary | ICD-10-CM

## 2020-03-01 DIAGNOSIS — K922 Gastrointestinal hemorrhage, unspecified: Secondary | ICD-10-CM

## 2020-03-01 LAB — CBC
HCT: 33.5 % — ABNORMAL LOW (ref 36.0–46.0)
Hemoglobin: 11 g/dL — ABNORMAL LOW (ref 12.0–15.0)
MCH: 29.3 pg (ref 26.0–34.0)
MCHC: 32.8 g/dL (ref 30.0–36.0)
MCV: 89.1 fL (ref 80.0–100.0)
Platelets: 228 10*3/uL (ref 150–400)
RBC: 3.76 MIL/uL — ABNORMAL LOW (ref 3.87–5.11)
RDW: 15.4 % (ref 11.5–15.5)
WBC: 20.9 10*3/uL — ABNORMAL HIGH (ref 4.0–10.5)
nRBC: 0 % (ref 0.0–0.2)

## 2020-03-01 LAB — BASIC METABOLIC PANEL
Anion gap: 8 (ref 5–15)
BUN: 12 mg/dL (ref 8–23)
CO2: 18 mmol/L — ABNORMAL LOW (ref 22–32)
Calcium: 8.2 mg/dL — ABNORMAL LOW (ref 8.9–10.3)
Chloride: 111 mmol/L (ref 98–111)
Creatinine, Ser: 0.57 mg/dL (ref 0.44–1.00)
GFR calc Af Amer: >60 mL/min (ref >60)
GFR calc non Af Amer: >60 mL/min (ref >60)
Glucose, Bld: 90 mg/dL (ref 70–99)
Potassium: 3.6 mmol/L (ref 3.5–5.1)
Sodium: 137 mmol/L (ref 135–145)

## 2020-03-01 MED ORDER — HYDROMORPHONE HCL 1 MG/ML IJ SOLN
1.0000 mg | Freq: Once | INTRAMUSCULAR | Status: AC
  Administered 2020-03-01: 1 mg via INTRAVENOUS
  Filled 2020-03-01: qty 1

## 2020-03-01 MED ORDER — HYDROCODONE-ACETAMINOPHEN 5-325 MG PO TABS
1.0000 | ORAL_TABLET | ORAL | Status: DC | PRN
  Administered 2020-03-01 – 2020-03-02 (×3): 1 via ORAL
  Filled 2020-03-01 (×3): qty 1

## 2020-03-01 MED ORDER — HYDROCODONE-ACETAMINOPHEN 5-325 MG PO TABS
1.0000 | ORAL_TABLET | Freq: Four times a day (QID) | ORAL | Status: DC | PRN
  Administered 2020-03-01 (×2): 1 via ORAL
  Filled 2020-03-01 (×2): qty 1

## 2020-03-01 MED ORDER — HYDROCODONE-ACETAMINOPHEN 5-325 MG PO TABS
1.0000 | ORAL_TABLET | ORAL | Status: DC | PRN
  Administered 2020-03-01: 1 via ORAL
  Filled 2020-03-01: qty 1

## 2020-03-01 MED ORDER — HYDROCODONE-ACETAMINOPHEN 5-325 MG PO TABS: 2.0000 | ORAL_TABLET | ORAL | Status: DC | PRN

## 2020-03-01 MED ORDER — ENOXAPARIN SODIUM 40 MG/0.4ML ~~LOC~~ SOLN
40.0000 mg | SUBCUTANEOUS | Status: DC
  Administered 2020-03-01 – 2020-03-03 (×2): 40 mg via SUBCUTANEOUS
  Filled 2020-03-01 (×2): qty 0.4

## 2020-03-01 MED ORDER — MORPHINE SULFATE (PF) 2 MG/ML IV SOLN
2.0000 mg | Freq: Once | INTRAVENOUS | Status: DC
  Filled 2020-03-01: qty 1

## 2020-03-01 MED ORDER — HYDROMORPHONE HCL 1 MG/ML IJ SOLN
1.0000 mg | Freq: Once | INTRAMUSCULAR | Status: AC
  Administered 2020-03-01: 1 mg via INTRAVENOUS

## 2020-03-01 MED ORDER — LIDOCAINE 5 % EX PTCH
2.0000 | MEDICATED_PATCH | CUTANEOUS | Status: DC
  Administered 2020-03-01: 2 via TRANSDERMAL
  Filled 2020-03-01 (×3): qty 2

## 2020-03-01 MED ORDER — SODIUM CHLORIDE 0.9 % IV SOLN: INTRAVENOUS | Status: DC

## 2020-03-01 MED ORDER — HYDROCODONE-ACETAMINOPHEN 5-325 MG PO TABS
1.0000 | ORAL_TABLET | Freq: Once | ORAL | Status: AC
  Administered 2020-03-01: 1 via ORAL
  Filled 2020-03-01: qty 1

## 2020-03-01 NOTE — Progress Notes (Signed)
PROGRESS NOTE    [name]  UGQ:[id number] DOB: 09-09-1946 DOA: 02/29/2020 PCP: Carlean Jews, NP    Assessment & Plan:   Principal Problem:   GI bleed Active Problems:   Chronic systolic heart failure (HCC)   Tobacco use   Essential hypertension   Peripheral vascular disease (HCC)   Acute GI bleeding   Nicotine dependence    [name] is a 74 y.o. 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 Symptoms were acute in onset and was associated with diaphoresis and shortness of breath.  She complains of abdominal pain which she rated 10 x 10 in intensity at its worst with radiation to her right leg.  Patient has a generalized nonpruritic rash which she has had since after she had a CT with contrast done on 02/23/20.   RIGHT Stump pain --Pt's main complaint now.  Per vascular surgery, this appears to be phantom pain. No clear etiology of reported hip pain. 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 PLAN: --Norco q4h PRN  Abdominal Pain --Reported on presentation, but resolved today. --AortoIliac Occlusion is chronic appearing. Mesenteric vessels IMA-70%; SMA 50%; Celiac-minimal also appear chronic. Lactate is normal; WBC is elevated- may have colitis.  PLAN: --mesenteric angiogram with possible intervention by vascular surgery, planned for tomorrow  Questionable GI bleed Patient presented for evaluation of abdominal pain, nausea and diarrhea. On presentation she had a white count of 22,000 and a hemoglobin of 14.6 g/dl and in 5 hours her hemoglobin dropped to 11.7g/dl and she is heme positive.  Hgb drop likely due to dilution and blood draws. PLAN: --monitor serial H&H --GI consult, no need for acute GI workup  currently  Chronic systolic heart failure Last known LVEF 40 - 45% Hold lisinopril for now since patient is normotensive  Essential hypertension Blood pressure stable Hold lisinopril for now since patient is normotensive  Peripheral vascular disease Status post bilateral AKA Continue statins Hold aspirin due to concern for GI bleed  Nicotine dependence and current smoker Patient states that 1 pack of cigarettes lasted 3 days Smoking cessation was discussed with her in detail She is unwilling to quit smoking at this time and declines a nicotine transdermal patch.  Leukocytosis --Pt reported only 1 episode of diarrhea, none since presentation.   DVT prophylaxis: Lovenox SQ Code Status: Full code  Family Communication: daughter updated at bedside today Status is: inpatient Dispo:   The patient is from: home Anticipated d/c is to: home Anticipated d/c date is: 1-2 days Patient currently is not medically stable to d/c due to: vascular intervention planned for tomorrow   Subjective and Interval History:  Pt complained only of right thigh pain.  Pt denied N/V/D and abdominal pain.  Pt said the documented diarrhea was just 1 time, with no obvious blood.  No fever, dyspnea, chest pain.  Her per-existing rash has been improving.     Objective: Vitals:   03/01/20 1500 03/01/20 1700 03/01/20 1800 03/01/20 1915  BP: (!) 130/58 140/69 139/73 134/63  Pulse: 92 (!) 112 96 95  Resp: 19  13   Temp:  99.4 F (37.4 C)    TempSrc:  Oral    SpO2: 94% 93% 95% 94%  Weight:      Height:  Intake/Output Summary (Last 24 hours) at 03/01/2020 2035 Last data filed at 03/01/2020 1900 Gross per 24 hour  Intake 1488.91 ml  Output --  Net 1488.91 ml   Filed Weights   02/29/20 0134 02/29/20 2300  Weight: 47.2 kg 42.3 kg    Examination:   Constitutional: NAD, AAOx3 HEENT: conjunctivae and lids normal, EOMI CV: RRR no M,R,G. Distal pulses +2.  No cyanosis.   RESP: CTA B/L,  normal respiratory effort  GI: +BS, NTND Extremities: bilateral AKA, no edema or wound over right thigh/stump site. SKIN: warm, dry.  Faint erythematous rash present in all parts of her body. Neuro: II - XII grossly intact.  Sensation intact   Data Reviewed: I have personally reviewed following labs and imaging studies  CBC: Recent Labs  Lab 02/29/20 0139 02/29/20 0730 02/29/20 1540 03/01/20 0526  WBC 22.0* 23.0*  --  20.9*  HGB 14.6 11.7* 11.1* 11.0*  HCT 46.3* 36.7 35.2* 33.5*  MCV 92.6 91.5  --  89.1  PLT 307 229  --  228   Basic Metabolic Panel: Recent Labs  Lab 02/29/20 0139 03/01/20 0526  NA 139 137  K 3.8 3.6  CL 107 111  CO2 20* 18*  GLUCOSE 166* 90  BUN 16 12  CREATININE 0.94 0.57  CALCIUM 9.2 8.2*   GFR: Estimated Creatinine Clearance: 18.9 mL/min (by C-G formula based on SCr of 0.57 mg/dL). Liver Function Tests: Recent Labs  Lab 02/29/20 0139  AST 19  ALT 11  ALKPHOS 106  BILITOT 0.6  PROT 7.2  ALBUMIN 3.8   No results for input(s): LIPASE, AMYLASE in the last 168 hours. No results for input(s): AMMONIA in the last 168 hours. Coagulation Profile: Recent Labs  Lab 02/29/20 1540  INR 1.1   Cardiac Enzymes: No results for input(s): CKTOTAL, CKMB, CKMBINDEX, TROPONINI in the last 168 hours. BNP (last 3 results) No results for input(s): PROBNP in the last 8760 hours. HbA1C: No results for input(s): HGBA1C in the last 72 hours. CBG: Recent Labs  Lab 02/29/20 2013  GLUCAP 86   Lipid Profile: No results for input(s): CHOL, HDL, LDLCALC, TRIG, CHOLHDL, LDLDIRECT in the last 72 hours. Thyroid Function Tests: No results for input(s): TSH, T4TOTAL, FREET4, T3FREE, THYROIDAB in the last 72 hours. Anemia Panel: No results for input(s): VITAMINB12, FOLATE, FERRITIN, TIBC, IRON, RETICCTPCT in the last 72 hours. Sepsis Labs: Recent Labs  Lab 02/29/20 0708  LATICACIDVEN 1.6    Recent Results (from the past 240 hour(s))  Blood culture  (routine x 2)     Status: None (Preliminary result)   Collection Time: 02/29/20  6:43 AM   Specimen: BLOOD  Result Value Ref Range Status   Specimen Description BLOOD RIGHT ANTECUBITAL  Final   Special Requests   Final    BOTTLES DRAWN AEROBIC AND ANAEROBIC Blood Culture adequate volume   Culture   Final    NO GROWTH 1 DAY Performed at [facility], [address] [place], [place] [address]    Report Status PENDING  Incomplete  Blood culture (routine x 2)     Status: None (Preliminary result)   Collection Time: 02/29/20  7:08 AM   Specimen: BLOOD  Result Value Ref Range Status   Specimen Description BLOOD BLOOD RIGHT FOREARM  Final   Special Requests   Final    BOTTLES DRAWN AEROBIC AND ANAEROBIC Blood Culture adequate volume   Culture   Final    NO GROWTH < 24 HOURS Performed at  [facility], [address], [place], [place] [address]    Report Status PENDING  Incomplete  SARS Coronavirus 2 by RT PCR (hospital order, performed in [facility] hospital lab) Nasopharyngeal Nasopharyngeal Swab     Status: None   Collection Time: 02/29/20  8:35 PM   Specimen: Nasopharyngeal Swab  Result Value Ref Range Status   SARS Coronavirus 2 NEGATIVE NEGATIVE Final    Comment: (NOTE) SARS-CoV-2 target nucleic acids are NOT DETECTED. The SARS-CoV-2 RNA is generally detectable in upper and lower respiratory specimens during the acute phase of infection. The lowest concentration of SARS-CoV-2 viral copies this assay can detect is 250 copies / mL. A negative result does not preclude SARS-CoV-2 infection and should not be used as the sole basis for treatment or other patient management decisions.  A negative result may occur with improper specimen collection / handling, submission of specimen other than nasopharyngeal swab, presence of viral mutation(s) within the areas targeted by this assay, and inadequate number of viral copies (<250 copies / mL). A negative result must be  combined with clinical observations, patient history, and epidemiological information. Fact Sheet for Patients:   BoilerBrush.com.cy Fact Sheet for Healthcare Providers: https://pope.com/ This test is not yet approved or cleared  by the [place] FDA and has been authorized for detection and/or diagnosis of SARS-CoV-2 by FDA under an Emergency Use Authorization (EUA).  This EUA will remain in effect (meaning this test can be used) for the duration of the COVID-19 declaration under Section 564(b)(1) of the Act, 21 U.S.C. section 360bbb-3(b)(1), unless the authorization is terminated or revoked sooner. Performed at [facility], [address] [place], [place] [address]   MRSA PCR Screening     Status: None   Collection Time: 02/29/20  8:35 PM  Result Value Ref Range Status   MRSA by PCR NEGATIVE NEGATIVE Final    Comment:        The GeneXpert MRSA Assay (FDA approved for NASAL specimens only), is one component of a comprehensive MRSA colonization surveillance program. It is not intended to diagnose MRSA infection nor to guide or monitor treatment for MRSA infections. Performed at [facility], [address] [place], [place] [address]       Radiology Studies: CT ABDOMEN PELVIS WO CONTRAST  Result Date: 02/29/2020 CLINICAL DATA:  Abdominal pain, nausea and diarrhea. EXAM: CT ABDOMEN AND PELVIS WITHOUT CONTRAST TECHNIQUE: Multidetector CT imaging of the abdomen and pelvis was performed following the standard protocol without IV contrast. COMPARISON:  CT abdomen dated 09/12/2006. FINDINGS: Lower chest: Chronic scarring/atelectasis at each lung base. Hepatobiliary: Small layering stones within the otherwise normal-appearing gallbladder. No focal liver abnormality. No bile duct dilatation seen. Pancreas: Unremarkable. No pancreatic ductal dilatation or surrounding inflammatory changes. Spleen: Normal in size  without focal abnormality. Adrenals/Urinary Tract: Chronic LEFT-sided hydronephrosis, severe in degree. Multiple dystrophic renal stones within the LEFT renal pelvis, largest measuring 1.4 cm. No RIGHT-sided hydronephrosis. Bladder appears normal. Several small calcifications within the LEFT lower pelvis, largest measuring 4 mm, of uncertain relationship to the distal LEFT ureter, but more likely vascular calcifications just outside of the distal LEFT ureter. No hydroureter. Stomach/Bowel: No dilated large or small bowel loops. Scattered diverticulosis of the LEFT colon but no focal inflammatory change seen to suggest acute diverticulitis at this time. No bowel wall thickening, mesenteric fluid or other secondary signs of active bowel wall inflammation. Moderate amount of stool and gas throughout the colon. Stomach is unremarkable, partially decompressed.  Appendix is not seen but there are no inflammatory changes about the cecum to suggest acute appendicitis. Vascular/Lymphatic: Aortic atherosclerosis. 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 Bilateral iliac stents in place. Reproductive: No adnexal mass or free fluid. Other: No free fluid or abscess collection identified. No free intraperitoneal air. Musculoskeletal: No acute or suspicious osseous finding. Superficial soft tissues are unremarkable. Soft tissues of the upper thighs are unremarkable. IMPRESSION: 1. No acute findings within the abdomen or pelvis. No bowel obstruction or evidence of active bowel wall inflammation. No free fluid or inflammatory change. No evidence of acute solid organ abnormality. No renal or ureteral calculi. 2. Chronic LEFT-sided hydronephrosis, severe in degree, possibly congenital. Multiple LEFT renal stones, largest measuring 1.4 cm. No obstructing ureteral stone. 3. Colonic diverticulosis without evidence of acute diverticulitis at this time. 4. Cholelithiasis without evidence of  acute cholecystitis. 5. 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 Aortic Atherosclerosis (ICD10-I70.0). Electronically Signed   By: Bary RichardStan  Maynard M.D.   On: 02/29/2020 04:17   CT Angio Abd/Pel W and/or Wo Contrast  Result Date: 02/29/2020 CLINICAL DATA:  Severe abdominal pain. Evaluate ischemia. For mesenteric EXAM: CTA ABDOMEN AND PELVIS WITHOUT AND WITH CONTRAST TECHNIQUE: Multidetector CT imaging of the abdomen and pelvis was performed using the standard protocol during bolus administration of intravenous contrast. Multiplanar reconstructed images and MIPs were obtained and reviewed to evaluate the vascular anatomy. CONTRAST:  75mL OMNIPAQUE IOHEXOL 350 MG/ML SOLN History contrast allergy; underwent emergency steroid prep and received contrast without incident. COMPARISON:  CT abdomen pelvis - earlier same day; 09/12/2006 FINDINGS: VASCULAR Aorta: There is a large amount of circumferential predominantly noncalcified atherosclerotic plaque throughout the abdominal aorta which ultimately occludes at the level of the takeoff of the IMA. No associated perivascular stranding. Celiac: There is a moderate amount of eccentric mixed calcified and noncalcified atherosclerotic plaque involving the origin the celiac artery, not resulting in a hemodynamically significant stenosis. Conventional branching pattern. SMA: There is a moderate to large amount of eccentric mixed calcified and noncalcified atherosclerotic plaque involving the origin and main trunk of the SMA which results in approximately 50% luminal narrowing approximately 3 cm distal to the origin (axial image 37, series 4; sagittal image 80, series 9). The distal tributaries of the SMA appear patent without discrete intraluminal filling defect with special attention paid to the middle and left colic branches. 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 The dominant right renal artery is widely patent without hemodynamically significant stenosis. The left renal artery is diffusely atrophic likely secondary to left renal atrophy due to chronic left-sided UPJ obstruction/stenosis. No discrete areas of vessel irregularity to suggest FMD. IMA: Diseased at its origin with focal severe (approximately 70%) luminal narrowing (image 66, series 4), with associated poststenotic dilatation. The distal tributaries of the IMA appear patent without discrete intraluminal filling defect. Inflow: As above, the distal aspect of the abdominal aorta is occluded caudal to the take-off of the IMA. Patient has undergone previous overlapping stenting of the bilateral common and external iliac arteries however both of the common and external iliac arteries are occluded throughout their courses. There is minimal atretic recanalization of the distal aspects of the bilateral internal iliac arteries likely via trans pelvic arterial collaterals. Proximal Outflow: There is no appreciable arterial flow to either lower extremity. Veins: The IVC and pelvic  venous systems appear widely patent. Review of the MIP images confirms the above findings. _________________________________________________________ NON-VASCULAR Lower chest: Limited visualization of lower thorax demonstrates advanced emphysematous change with bibasilar subsegmental atelectasis and mild bibasilar bronchiectasis. No discrete focal airspace opacities. No pleural effusion. Cardiomegaly.  No pericardial effusion. Hepatobiliary: Normal hepatic contour. No discrete hepatic lesions. Several layering gallstones are seen within otherwise normal-appearing gallbladder. No intra or extrahepatic biliary ductal dilatation. No ascites. Pancreas: Normal appearance of the pancreas. Spleen: Normal appearance of the spleen. Adrenals/Urinary Tract: There is symmetric enhancement of the bilateral kidneys. Chronic  left-sided UPJ narrowing/obstruction with associated upstream marked pelvicaliectasis and asymmetric left renal atrophy and compensatory hypertrophy of the right kidney, progressed compared to remote examination performed in 2007. Several layering nonobstructing stones are seen within dilated inferior pole calices with dominant left-sided renal stone measuring approximately 1.5 x 0.7 x 0.7 cm (axial image 63, series 6; coronal image 56, series 10). Potential punctate (approximately 2 mm) nonobstructing right-sided renal stone. Additional calcifications about the right renal hila are favored to be vascular in etiology. No evidence of right-sided urinary obstruction. Normal appearance of the bilateral adrenal glands. Normal appearance of the urinary bladder given degree distention. Stomach/Bowel: Evaluation of intestines is degraded secondary to lack of significant mesenteric fat as well as enteric contrast. Colonic diverticulosis without evidence superimposed acute diverticulitis. Circumferential wall thickening involving the distal aspect of the transverse colon extending to involve the mid descending colon (axial images 25, 35, 48, 51, 67, series 6; coronal images 37, 49 and 56, series 10). No evidence of enteric obstruction. No pneumoperitoneum, pneumatosis or portal venous gas. Lymphatic: No definitive bulky retroperitoneal, mesenteric or pelvic lymphadenopathy. Reproductive: Dystrophic calcification of the left adnexa. Otherwise, normal appearance of the pelvic organs for age. No free fluid the pelvic cul-de-sac. Other: Mild diffuse body wall anasarca. Musculoskeletal: No acute or aggressive osseous abnormalities. Mild to moderate multilevel lumbar spine DDD, worse at L5-S1 with disc space height loss, endplate irregularity and sclerosis. Old mild (approximately 25%) compression deformity involving the superior endplate of the L3 vertebral body (sagittal image 72, series 9, without associated acute fracture line  or paraspinal hematoma. IMPRESSION: VASCULAR 1. Age-indeterminate occlusion of the distal aspect of the abdominal aorta caudal to the take-off of the IMA. There is complete occlusion of the bilateral common and external iliac arteries with no significant arterial flow to either lower extremity. 2. High-grade (approximately 70%) narrowing involving the origin of the IMA with associated poststenotic dilatation. 3. Focal approximately 50% luminal narrowing involving the mid aspect of the SMA approximately 3 cm peripheral to its origin. 4. Minimal amount of atherosclerotic plaque involves the origin the celiac artery, not resulting in a hemodynamically significant stenosis. 5. Cardiomegaly. 6.  Aortic Atherosclerosis (ICD10-I70.0). NON-VASCULAR 1. Apparent circumferential wall thickening involving the distal half of the transverse colon extending to involve the descending colon without of enteric obstruction or definable/drainable fluid collection. Findings are nonspecific though could be seen in the setting of an enteritis potentially of ischemic (favored, given associated vasculopathy), infectious or inflammatory etiology. Currently there is no evidence enteric perforation, pneumatosis or portal venous gas. Correlation with lactic acid level and/or colonoscopy could be performed as indicated. 2. Colonic diverticulosis without evidence superimposed acute diverticulitis. 3. 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 4. Nonobstructing bilateral nephrolithiasis, left greater than right. 5. Cholelithiasis without evidence cholecystitis. Critical Value/emergent results were called by telephone at the time of interpretation on 02/29/2020 at 2:32 pm to  provider Merlyn Lot , who verbally acknowledged these results. Electronically Signed   By: Sandi Mariscal M.D.   On: 02/29/2020 14:36     Scheduled Meds: . atorvastatin  40 mg Oral QHS  . Chlorhexidine Gluconate  Cloth  6 each Topical Daily  . cholecalciferol  1,000 Units Oral Daily  . lidocaine  2 patch Transdermal Q24H   Continuous Infusions: . [START ON 03/02/2020] sodium chloride       LOS: 1 day     Enzo Bi, MD [facility] If 7PM-7AM, please contact night-coverage 03/01/2020, 8:35 PM

## 2020-03-01 NOTE — Progress Notes (Signed)
Pt is complaining of 10/10 R leg pain 1hr after Norco prn administration. MD made aware and new order received.

## 2020-03-01 NOTE — Progress Notes (Signed)
Pt has breakthrough pain. MD made aware. Verbal order for Norco 5/325 placed.

## 2020-03-01 NOTE — Progress Notes (Signed)
Whitney Minium, MD [facility]   [address], [address] [place], [place] [address] Phone: [phone] Fax : [phone]   Subjective: This patient is being seen for quite positive stools.  She denies any overt sign of GI bleeding.  The patient's labs on admission showed her Hemoglobin to be 14.6 with a white cell count of 23.  The patient was hydrated and came down to 11.7.  The patient's hemoglobin in October was 8 and in February it was between 9.1 and 10.8.  The patient denies any history of GI bleeding in the recent past.  The patient's MCV was also noted to be 89.  Objective: Vital signs in last 24 hours: Vitals:   03/01/20 1400 03/01/20 1500 03/01/20 1700 03/01/20 1800  BP: (!) 151/67 (!) 130/58 140/69 139/73  Pulse: 91 92 (!) 112 96  Resp: 12 19  13   Temp:   99.4 F (37.4 C)   TempSrc:   Oral   SpO2: 97% 94% 93% 95%  Weight:      Height:       Weight change: -4.874 kg  Intake/Output Summary (Last 24 hours) at 03/01/2020 1921 Last data filed at 03/01/2020 1900 Gross per 24 hour  Intake 2602.79 ml  Output --  Net 2602.79 ml     Exam: Heart:: Regular rate and rhythm, S1S2 present or without murmur or extra heart sounds Lungs: normal and clear to auscultation and percussion Abdomen: soft, nontender, normal bowel sounds   Lab Results: @LABTEST2 @ Micro Results: Recent Results (from the past 240 hour(s))  Blood culture (routine x 2)     Status: None (Preliminary result)   Collection Time: 02/29/20  6:43 AM   Specimen: BLOOD  Result Value Ref Range Status   Specimen Description BLOOD RIGHT ANTECUBITAL  Final   Special Requests   Final    BOTTLES DRAWN AEROBIC AND ANAEROBIC Blood Culture adequate volume   Culture   Final    NO GROWTH 1 DAY Performed at [facility], [address] [place], [address] [place]    Report Status PENDING  Incomplete  Blood culture (routine x 2)     Status: None (Preliminary result)   Collection Time: 02/29/20  7:08 AM   Specimen:  BLOOD  Result Value Ref Range Status   Specimen Description BLOOD BLOOD RIGHT FOREARM  Final   Special Requests   Final    BOTTLES DRAWN AEROBIC AND ANAEROBIC Blood Culture adequate volume   Culture   Final    NO GROWTH < 24 HOURS Performed at [facility], [address] [place], [address] [place]    Report Status PENDING  Incomplete  SARS Coronavirus 2 by RT PCR (hospital order, performed in [facility] hospital lab) Nasopharyngeal Nasopharyngeal Swab     Status: None   Collection Time: 02/29/20  8:35 PM   Specimen: Nasopharyngeal Swab  Result Value Ref Range Status   SARS Coronavirus 2 NEGATIVE NEGATIVE Final    Comment: (NOTE) SARS-CoV-2 target nucleic acids are NOT DETECTED. The SARS-CoV-2 RNA is generally detectable in upper and lower respiratory specimens during the acute phase of infection. The lowest concentration of SARS-CoV-2 viral copies this assay can detect is 250 copies / mL. A negative result does not preclude SARS-CoV-2 infection and should not be used as the sole basis for treatment or other patient management decisions.  A negative result may occur with improper specimen collection / handling, submission of specimen other than nasopharyngeal swab, presence of viral mutation(s) within the  areas targeted by this assay, and inadequate number of viral copies (<250 copies / mL). A negative result must be combined with clinical observations, patient history, and epidemiological information. Fact Sheet for Patients:   BoilerBrush.com.cy Fact Sheet for Healthcare Providers: https://pope.com/ This test is not yet approved or cleared  by the [place] FDA and has been authorized for detection and/or diagnosis of SARS-CoV-2 by FDA under an Emergency Use Authorization (EUA).  This EUA will remain in effect (meaning this test can be used) for the duration of the COVID-19 declaration under Section 564(b)(1) of the  Act, 21 U.S.C. section 360bbb-3(b)(1), unless the authorization is terminated or revoked sooner. Performed at [facility], [address] [place], [place] [address]   MRSA PCR Screening     Status: None   Collection Time: 02/29/20  8:35 PM  Result Value Ref Range Status   MRSA by PCR NEGATIVE NEGATIVE Final    Comment:        The GeneXpert MRSA Assay (FDA approved for NASAL specimens only), is one component of a comprehensive MRSA colonization surveillance program. It is not intended to diagnose MRSA infection nor to guide or monitor treatment for MRSA infections. Performed at [facility], [address] [place], [place] [address]    Studies/Results: CT ABDOMEN PELVIS WO CONTRAST  Result Date: 02/29/2020 CLINICAL DATA:  Abdominal pain, nausea and diarrhea. EXAM: CT ABDOMEN AND PELVIS WITHOUT CONTRAST TECHNIQUE: Multidetector CT imaging of the abdomen and pelvis was performed following the standard protocol without IV contrast. COMPARISON:  CT abdomen dated 09/12/2006. FINDINGS: Lower chest: Chronic scarring/atelectasis at each lung base. Hepatobiliary: Small layering stones within the otherwise normal-appearing gallbladder. No focal liver abnormality. No bile duct dilatation seen. Pancreas: Unremarkable. No pancreatic ductal dilatation or surrounding inflammatory changes. Spleen: Normal in size without focal abnormality. Adrenals/Urinary Tract: Chronic LEFT-sided hydronephrosis, severe in degree. Multiple dystrophic renal stones within the LEFT renal pelvis, largest measuring 1.4 cm. No RIGHT-sided hydronephrosis. Bladder appears normal. Several small calcifications within the LEFT lower pelvis, largest measuring 4 mm, of uncertain relationship to the distal LEFT ureter, but more likely vascular calcifications just outside of the distal LEFT ureter. No hydroureter. Stomach/Bowel: No dilated large or small bowel loops. Scattered diverticulosis of the LEFT colon but no  focal inflammatory change seen to suggest acute diverticulitis at this time. No bowel wall thickening, mesenteric fluid or other secondary signs of active bowel wall inflammation. Moderate amount of stool and gas throughout the colon. Stomach is unremarkable, partially decompressed. Appendix is not seen but there are no inflammatory changes about the cecum to suggest acute appendicitis. Vascular/Lymphatic: Aortic atherosclerosis. 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 Bilateral iliac stents in place. Reproductive: No adnexal mass or free fluid. Other: No free fluid or abscess collection identified. No free intraperitoneal air. Musculoskeletal: No acute or suspicious osseous finding. Superficial soft tissues are unremarkable. Soft tissues of the upper thighs are unremarkable. IMPRESSION: 1. No acute findings within the abdomen or pelvis. No bowel obstruction or evidence of active bowel wall inflammation. No free fluid or inflammatory change. No evidence of acute solid organ abnormality. No renal or ureteral calculi. 2. Chronic LEFT-sided hydronephrosis, severe in degree, possibly congenital. Multiple LEFT renal stones, largest measuring 1.4 cm. No obstructing ureteral stone. 3. Colonic diverticulosis without evidence of acute diverticulitis at this time. 4. Cholelithiasis without evidence of acute cholecystitis. 5. 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 Aortic Atherosclerosis (  ICD10-I70.0). Electronically Signed   By: Franki Cabot M.D.   On: 02/29/2020 04:17   CT Angio Abd/Pel W and/or Wo Contrast  Result Date: 02/29/2020 CLINICAL DATA:  Severe abdominal pain. Evaluate ischemia. For mesenteric EXAM: CTA ABDOMEN AND PELVIS WITHOUT AND WITH CONTRAST TECHNIQUE: Multidetector CT imaging of the abdomen and pelvis was performed using the standard protocol during bolus administration of intravenous contrast. Multiplanar  reconstructed images and MIPs were obtained and reviewed to evaluate the vascular anatomy. CONTRAST:  62mL OMNIPAQUE IOHEXOL 350 MG/ML SOLN History contrast allergy; underwent emergency steroid prep and received contrast without incident. COMPARISON:  CT abdomen pelvis - earlier same day; 09/12/2006 FINDINGS: VASCULAR Aorta: There is a large amount of circumferential predominantly noncalcified atherosclerotic plaque throughout the abdominal aorta which ultimately occludes at the level of the takeoff of the IMA. No associated perivascular stranding. Celiac: There is a moderate amount of eccentric mixed calcified and noncalcified atherosclerotic plaque involving the origin the celiac artery, not resulting in a hemodynamically significant stenosis. Conventional branching pattern. SMA: There is a moderate to large amount of eccentric mixed calcified and noncalcified atherosclerotic plaque involving the origin and main trunk of the SMA which results in approximately 50% luminal narrowing approximately 3 cm distal to the origin (axial image 37, series 4; sagittal image 80, series 9). The distal tributaries of the SMA appear patent without discrete intraluminal filling defect with special attention paid to the middle and left colic branches. 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 The dominant right renal artery is widely patent without hemodynamically significant stenosis. The left renal artery is diffusely atrophic likely secondary to left renal atrophy due to chronic left-sided UPJ obstruction/stenosis. No discrete areas of vessel irregularity to suggest FMD. IMA: Diseased at its origin with focal severe (approximately 70%) luminal narrowing (image 66, series 4), with associated poststenotic dilatation. The distal tributaries of the IMA appear patent without discrete intraluminal filling defect. Inflow: As above, the  distal aspect of the abdominal aorta is occluded caudal to the take-off of the IMA. Patient has undergone previous overlapping stenting of the bilateral common and external iliac arteries however both of the common and external iliac arteries are occluded throughout their courses. There is minimal atretic recanalization of the distal aspects of the bilateral internal iliac arteries likely via trans pelvic arterial collaterals. Proximal Outflow: There is no appreciable arterial flow to either lower extremity. Veins: The IVC and pelvic venous systems appear widely patent. Review of the MIP images confirms the above findings. _________________________________________________________ NON-VASCULAR Lower chest: Limited visualization of lower thorax demonstrates advanced emphysematous change with bibasilar subsegmental atelectasis and mild bibasilar bronchiectasis. No discrete focal airspace opacities. No pleural effusion. Cardiomegaly.  No pericardial effusion. Hepatobiliary: Normal hepatic contour. No discrete hepatic lesions. Several layering gallstones are seen within otherwise normal-appearing gallbladder. No intra or extrahepatic biliary ductal dilatation. No ascites. Pancreas: Normal appearance of the pancreas. Spleen: Normal appearance of the spleen. Adrenals/Urinary Tract: There is symmetric enhancement of the bilateral kidneys. Chronic left-sided UPJ narrowing/obstruction with associated upstream marked pelvicaliectasis and asymmetric left renal atrophy and compensatory hypertrophy of the right kidney, progressed compared to remote examination performed in 2007. Several layering nonobstructing stones are seen within dilated inferior pole calices with dominant left-sided renal stone measuring approximately 1.5 x 0.7 x 0.7 cm (axial image 63, series 6; coronal image 56, series 10). Potential punctate (approximately 2 mm) nonobstructing right-sided renal stone.  Additional calcifications about the right renal hila  are favored to be vascular in etiology. No evidence of right-sided urinary obstruction. Normal appearance of the bilateral adrenal glands. Normal appearance of the urinary bladder given degree distention. Stomach/Bowel: Evaluation of intestines is degraded secondary to lack of significant mesenteric fat as well as enteric contrast. Colonic diverticulosis without evidence superimposed acute diverticulitis. Circumferential wall thickening involving the distal aspect of the transverse colon extending to involve the mid descending colon (axial images 25, 35, 48, 51, 67, series 6; coronal images 37, 49 and 56, series 10). No evidence of enteric obstruction. No pneumoperitoneum, pneumatosis or portal venous gas. Lymphatic: No definitive bulky retroperitoneal, mesenteric or pelvic lymphadenopathy. Reproductive: Dystrophic calcification of the left adnexa. Otherwise, normal appearance of the pelvic organs for age. No free fluid the pelvic cul-de-sac. Other: Mild diffuse body wall anasarca. Musculoskeletal: No acute or aggressive osseous abnormalities. Mild to moderate multilevel lumbar spine DDD, worse at L5-S1 with disc space height loss, endplate irregularity and sclerosis. Old mild (approximately 25%) compression deformity involving the superior endplate of the L3 vertebral body (sagittal image 72, series 9, without associated acute fracture line or paraspinal hematoma. IMPRESSION: VASCULAR 1. Age-indeterminate occlusion of the distal aspect of the abdominal aorta caudal to the take-off of the IMA. There is complete occlusion of the bilateral common and external iliac arteries with no significant arterial flow to either lower extremity. 2. High-grade (approximately 70%) narrowing involving the origin of the IMA with associated poststenotic dilatation. 3. Focal approximately 50% luminal narrowing involving the mid aspect of the SMA approximately 3 cm peripheral to its origin. 4. Minimal amount of atherosclerotic plaque  involves the origin the celiac artery, not resulting in a hemodynamically significant stenosis. 5. Cardiomegaly. 6.  Aortic Atherosclerosis (ICD10-I70.0). NON-VASCULAR 1. Apparent circumferential wall thickening involving the distal half of the transverse colon extending to involve the descending colon without of enteric obstruction or definable/drainable fluid collection. Findings are nonspecific though could be seen in the setting of an enteritis potentially of ischemic (favored, given associated vasculopathy), infectious or inflammatory etiology. Currently there is no evidence enteric perforation, pneumatosis or portal venous gas. Correlation with lactic acid level and/or colonoscopy could be performed as indicated. 2. Colonic diverticulosis without evidence superimposed acute diverticulitis. 3. 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 4. Nonobstructing bilateral nephrolithiasis, left greater than right. 5. Cholelithiasis without evidence cholecystitis. Critical Value/emergent results were called by telephone at the time of interpretation on 02/29/2020 at 2:32 pm to provider Willy EddyPATRICK ROBINSON , who verbally acknowledged these results. Electronically Signed   By: Simonne ComeJohn  Watts M.D.   On: 02/29/2020 14:36   Medications: I have reviewed the patient's current medications. Scheduled Meds: . atorvastatin  40 mg Oral QHS  . Chlorhexidine Gluconate Cloth  6 each Topical Daily  . cholecalciferol  1,000 Units Oral Daily  . lidocaine  2 patch Transdermal Q24H   Continuous Infusions: . [START ON 03/02/2020] sodium chloride     PRN Meds:.HYDROcodone-acetaminophen, ondansetron **OR** ondansetron (ZOFRAN) IV   Assessment: Principal Problem:   GI bleed Active Problems:   Chronic systolic heart failure (HCC)   Tobacco use   Essential hypertension   Peripheral vascular disease (HCC)   Acute GI bleeding   Nicotine dependence    Plan: This patient is  in the ICU with multiple medical problems and has a complicated vascular history.  The patient has bilateral AKA's.  She was found to have heme positive stools with a  normal MCV.  The patient should follow-up with a colonoscopy as an outpatient since she has never had a colonoscopy.  The patient will also have iron studies sent off tomorrow morning with her blood draws to see if there is any iron deficiency anemia.  The patient does not appear to need any acute GI work-up at the present time.  The patient has been explained the plan agrees with it.   LOS: 1 day   [name] 03/01/2020, 7:21 PM Pager [phone] 7am-5pm  Check AMION for 5pm -7am coverage and on weekends

## 2020-03-02 ENCOUNTER — Encounter
Admission: EM | Disposition: A | Discharge status: Home / Self Care | Payer: Self-pay | Source: Home / Self Care | Attending: Hospitalist

## 2020-03-02 ENCOUNTER — Encounter: Payer: Self-pay | Admitting: Internal Medicine

## 2020-03-02 DIAGNOSIS — K551 Chronic vascular disorders of intestine: Secondary | ICD-10-CM

## 2020-03-02 HISTORY — PX: VISCERAL ANGIOGRAPHY: CATH118276

## 2020-03-02 LAB — CBC
HCT: 33.4 % — ABNORMAL LOW (ref 36.0–46.0)
Hemoglobin: 11.1 g/dL — ABNORMAL LOW (ref 12.0–15.0)
MCH: 29.3 pg (ref 26.0–34.0)
MCHC: 33.2 g/dL (ref 30.0–36.0)
MCV: 88.1 fL (ref 80.0–100.0)
Platelets: 202 K/uL (ref 150–400)
RBC: 3.79 MIL/uL — ABNORMAL LOW (ref 3.87–5.11)
RDW: 15.5 % (ref 11.5–15.5)
WBC: 14.3 K/uL — ABNORMAL HIGH (ref 4.0–10.5)
nRBC: 0 % (ref 0.0–0.2)

## 2020-03-02 LAB — BASIC METABOLIC PANEL
Anion gap: 7 (ref 5–15)
BUN: 12 mg/dL (ref 8–23)
CO2: 20 mmol/L — ABNORMAL LOW (ref 22–32)
Calcium: 8.3 mg/dL — ABNORMAL LOW (ref 8.9–10.3)
Chloride: 108 mmol/L (ref 98–111)
Creatinine, Ser: 0.64 mg/dL (ref 0.44–1.00)
GFR calc Af Amer: >60 mL/min (ref >60)
GFR calc non Af Amer: >60 mL/min (ref >60)
Glucose, Bld: 97 mg/dL (ref 70–99)
Potassium: 3.6 mmol/L (ref 3.5–5.1)
Sodium: 135 mmol/L (ref 135–145)

## 2020-03-02 LAB — IRON AND TIBC
Iron: 18 ug/dL — ABNORMAL LOW (ref 28–170)
Saturation Ratios: 9 % — ABNORMAL LOW (ref 10.4–31.8)
TIBC: 210 ug/dL — ABNORMAL LOW (ref 250–450)
UIBC: 192 ug/dL

## 2020-03-02 LAB — MAGNESIUM: Magnesium: 1.6 mg/dL — ABNORMAL LOW (ref 1.7–2.4)

## 2020-03-02 SURGERY — VISCERAL ANGIOGRAPHY
Anesthesia: Moderate Sedation

## 2020-03-02 MED ORDER — DIPHENHYDRAMINE HCL 50 MG/ML IJ SOLN
INTRAMUSCULAR | Status: AC
  Administered 2020-03-02: 50 mg via INTRAVENOUS
  Filled 2020-03-02: qty 1

## 2020-03-02 MED ORDER — HYDROMORPHONE HCL 1 MG/ML IJ SOLN
0.5000 mg | Freq: Once | INTRAMUSCULAR | Status: AC
  Administered 2020-03-02: 0.5 mg via INTRAVENOUS
  Filled 2020-03-02: qty 0.5

## 2020-03-02 MED ORDER — CEFAZOLIN SODIUM-DEXTROSE 2-4 GM/100ML-% IV SOLN
2.0000 g | Freq: Once | INTRAVENOUS | Status: AC
  Administered 2020-03-02: 2 g via INTRAVENOUS
  Filled 2020-03-02: qty 100

## 2020-03-02 MED ORDER — SODIUM CHLORIDE 0.9 % IV SOLN
200.0000 mg | Freq: Once | INTRAVENOUS | Status: AC
  Administered 2020-03-02: 200 mg via INTRAVENOUS
  Filled 2020-03-02: qty 10

## 2020-03-02 MED ORDER — METHYLPREDNISOLONE SODIUM SUCC 125 MG IJ SOLR: 125.0000 mg | Freq: Once | INTRAMUSCULAR | Status: AC | PRN

## 2020-03-02 MED ORDER — HEPARIN SODIUM (PORCINE) 1000 UNIT/ML IJ SOLN
INTRAMUSCULAR | Status: DC | PRN
  Administered 2020-03-02: 3000 [IU] via INTRAVENOUS

## 2020-03-02 MED ORDER — HEPARIN SODIUM (PORCINE) 1000 UNIT/ML IJ SOLN
INTRAMUSCULAR | Status: AC
  Filled 2020-03-02: qty 1

## 2020-03-02 MED ORDER — FAMOTIDINE 20 MG PO TABS
ORAL_TABLET | ORAL | Status: AC
  Administered 2020-03-02: 40 mg via ORAL
  Filled 2020-03-02: qty 2

## 2020-03-02 MED ORDER — FAMOTIDINE 20 MG PO TABS: 40.0000 mg | ORAL_TABLET | Freq: Once | ORAL | Status: AC | PRN

## 2020-03-02 MED ORDER — DIPHENHYDRAMINE HCL 50 MG/ML IJ SOLN: 25.0000 mg | Freq: Four times a day (QID) | INTRAMUSCULAR | Status: DC | PRN

## 2020-03-02 MED ORDER — MIDAZOLAM HCL 5 MG/5ML IJ SOLN
INTRAMUSCULAR | Status: AC
  Filled 2020-03-02: qty 5

## 2020-03-02 MED ORDER — CEFAZOLIN SODIUM-DEXTROSE 2-4 GM/100ML-% IV SOLN
INTRAVENOUS | Status: AC
  Administered 2020-03-02: 2 g via INTRAVENOUS
  Filled 2020-03-02: qty 100

## 2020-03-02 MED ORDER — MAGNESIUM SULFATE 2 GM/50ML IV SOLN
2.0000 g | Freq: Once | INTRAVENOUS | Status: AC
  Administered 2020-03-02: 2 g via INTRAVENOUS
  Filled 2020-03-02: qty 50

## 2020-03-02 MED ORDER — FENTANYL CITRATE (PF) 100 MCG/2ML IJ SOLN
INTRAMUSCULAR | Status: AC
  Filled 2020-03-02: qty 2

## 2020-03-02 MED ORDER — METHYLPREDNISOLONE SODIUM SUCC 125 MG IJ SOLR
INTRAMUSCULAR | Status: AC
  Administered 2020-03-02: 125 mg via INTRAVENOUS
  Filled 2020-03-02: qty 2

## 2020-03-02 MED ORDER — MIDAZOLAM HCL 2 MG/2ML IJ SOLN
INTRAMUSCULAR | Status: DC | PRN
  Administered 2020-03-02 (×2): 1 mg via INTRAVENOUS
  Administered 2020-03-02: 2 mg via INTRAVENOUS
  Administered 2020-03-02: 1 mg via INTRAVENOUS

## 2020-03-02 MED ORDER — FENTANYL CITRATE (PF) 100 MCG/2ML IJ SOLN
INTRAMUSCULAR | Status: DC | PRN
  Administered 2020-03-02 (×3): 25 ug via INTRAVENOUS
  Administered 2020-03-02: 50 ug via INTRAVENOUS
  Administered 2020-03-02 (×3): 25 ug via INTRAVENOUS

## 2020-03-02 MED ORDER — ONDANSETRON HCL 4 MG/2ML IJ SOLN: 4.0000 mg | Freq: Four times a day (QID) | INTRAMUSCULAR | Status: DC | PRN

## 2020-03-02 MED ORDER — HYDROMORPHONE HCL 1 MG/ML IJ SOLN: 1.0000 mg | Freq: Once | INTRAMUSCULAR | Status: DC | PRN

## 2020-03-02 MED ORDER — DIPHENHYDRAMINE HCL 25 MG PO CAPS: 25.0000 mg | ORAL_CAPSULE | Freq: Four times a day (QID) | ORAL | Status: DC | PRN

## 2020-03-02 MED ORDER — SODIUM CHLORIDE 0.9 % IV SOLN: INTRAVENOUS | Status: DC

## 2020-03-02 MED ORDER — MIDAZOLAM HCL 2 MG/ML PO SYRP
8.0000 mg | ORAL_SOLUTION | Freq: Once | ORAL | Status: DC | PRN
  Filled 2020-03-02: qty 4

## 2020-03-02 MED ORDER — DIPHENHYDRAMINE HCL 50 MG/ML IJ SOLN: 50.0000 mg | Freq: Once | INTRAMUSCULAR | Status: AC | PRN

## 2020-03-02 SURGICAL SUPPLY — 17 items
BALLN LUTONIX 018 5X40X130 (BALLOONS) ×2
BALLOON LUTONIX 018 5X40X130 (BALLOONS) ×1 IMPLANT
CANNULA 5F STIFF (CANNULA) ×2 IMPLANT
CATH ANGIO 5F 100CM .035 PIG (CATHETERS) ×2 IMPLANT
CATH VERT 5FR 125CM (CATHETERS) ×2 IMPLANT
DEVICE PRESTO INFLATION (MISCELLANEOUS) ×2 IMPLANT
DEVICE RAD TR BAND REGULAR (VASCULAR PRODUCTS) ×2 IMPLANT
DEVICE TORQUE (MISCELLANEOUS) ×2 IMPLANT
DRAPE BRACHIAL (DRAPES) ×4 IMPLANT
GLIDEWIRE ADV .035X260CM (WIRE) ×2 IMPLANT
PACK ANGIOGRAPHY (CUSTOM PROCEDURE TRAY) ×2 IMPLANT
SHEATH HALO 035 6FRX10 (SHEATH) ×2 IMPLANT
SYR MEDRAD MARK 7 150ML (SYRINGE) ×2 IMPLANT
TUBING CONTRAST HIGH PRESS 72 (TUBING) ×2 IMPLANT
VALVE HEMO TOUHY BORST Y (ADAPTER) ×2 IMPLANT
WIRE G V18X300CM (WIRE) ×2 IMPLANT
WIRE J 3MM .035X145CM (WIRE) ×2 IMPLANT

## 2020-03-02 NOTE — Op Note (Signed)
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 Ultrasound guidance for vascular access left radial artery 2. Catheter placement into SMA from right femoral approach 3. Aortogram and selective SMA angiogram 4. Angioplasty of the SMA with a 5 mm diameter by 4 cm Lutonix drug-coated angioplasty balloon   Anesthesia: local with moderate conscious sedation for approximately 60 minutes using 5 mg of Versed and 200 mcg of Fentanyl  Fluoro Time: 11.3 minutes  Contrast: 75 cc  Indications: Patient is a 74 year old female with an extensive vascular history who is admitted with a white count of 20,000 and signs of likely ischemic colitis.  She has significant abdominal pain.  CT scan showed significant disease in the SMA, IMA, with a known aortic occlusion below the IMA.  The patient already has bilateral above-knee amputations and has a longstanding aortoiliac occlusion.  Angiogram is performed to evaluate the lesion more thoroughly and potentially allow treatment. Risks and benefits were discussed and informed consent was obtained  Procedure: The patient was identified and appropriate procedural time out was performed. The patient was then placed supine on the table and prepped and draped in the usual sterile fashion.Moderate conscious sedation was administered during a face to face encounter with the patient with the RN monitoring their vital signs, mental status, telemetry and pulse oximetry throughout the procedure.  Ultrasound was used to evaluate the left radial artery. It was patent . A digital ultrasound image was  acquired. A acupuncture needle was used to access the left radial artery under direct ultrasound guidance and a permanent image was performed. A micropuncture wire and sheath were then placed.  A 0.035 J wire was advanced without resistance and a 5Fr sheath was placed. Pigtail catheter was then placed into the aorta and initially an AP aortogram was performed which showed that the aorta was occluded below the IMA.  The IMA was somewhat large although the origin was not particularly well seen on the aortogram.  Image quality was exceptionally poor and the patient was very uncooperative throughout the procedure.  The renal arteries appeared to be patent.  In the AP projection, the origins of the celiac and SMA were not well seen.  Imaging the lateral projection was completely useless due to continuous patient motion.  Tediously, I used a long Kumpe catheter and advantage wire to selectively cannulate the SMA.  This was very difficult and had to be done in the AP projection and her continuous motion made this extremely tedious.  I first cannulated the right renal artery which was patent on selective imaging and this gave [place] a roadmap knowing that the SMA was just above this on the CT scan.  The SMA was then cannulated and imaging was performed through the Kumpe catheter of the SMA.  There was indeed a very high-grade stenosis several centimeters into the SMA in the 80 to 90% range.  I was able to cross this lesion with minimal difficulty and a V 18 wire was placed distally.  The 100 cm catheter did not get into the SMA, so we did not have the sheath long enough to work through, so imaging was performed with a Touhy Borst on the back of the Kumpe catheter over the V 18 wire.  This did show [place] a target in the  mid SMA and I elected to treat this with angioplasty as we did not have adequate sheath access to consider stent placement.  A 5 mm diameter by 4 cm length Lutonix drug-coated angioplasty balloon was selected and  placed across the lesion in the mid SMA.  The balloon was inflated to 12 atm.  Improved flow with less than 40% residual stenosis in the mid SMA lesion was seen on completion angiogram and I elected to terminate the procedure.  If the patient has recurrent symptoms or recurrent SMA disease, a long sheath can be obtained and stent placement would be considered on a subsequent procedure.  The diagnostic catheter and wire were removed.  The sheath was removed from the right radial artery and a TR band was placed for hemostasis.  She was taken to the recovery room in stable condition  Findings:  Aortogram:The aorta was occluded below the IMA.  The IMA was somewhat large although the origin was not particularly well seen on the aortogram.  Image quality was exceptionally poor and the patient was very uncooperative throughout the procedure.  The renal arteries appeared to be patent.  In the AP projection, the origins of the celiac and SMA were not well seen.  Imaging the lateral projection was completely useless due to continuous patient motion. SMA:80 to 90% stenosis in the mid SMA 3 to 4 cm beyond the origin which was highly calcific.     Disposition: Patient was taken to the recovery room in stable condition having tolerated the procedure well.   Leotis Pain 03/02/2020 4:01 PM   This note was created with Dragon Medical transcription system. Any errors in dictation are purely unintentional.

## 2020-03-02 NOTE — Progress Notes (Signed)
PROGRESS NOTE    [name]  BMW:[id number] DOB: 05/28/1946 DOA: 02/29/2020 PCP: Carlean Jews, NP    Assessment & Plan:   Principal Problem:   GI bleed Active Problems:   Chronic systolic heart failure (HCC)   Tobacco use   Essential hypertension   Peripheral vascular disease (HCC)   Acute GI bleeding   Nicotine dependence    [name] is a 74 y.o. 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 Symptoms were acute in onset and was associated with diaphoresis and shortness of breath.  She complained of abdominal pain which she rated 10 x 10 in intensity at its worst with radiation to her right leg.  Patient has a generalized nonpruritic rash which she has had since after she had a CT with contrast done on 02/23/20.   RIGHT Stump pain --Pt's main complaint now.  Per vascular surgery, this appears to be phantom pain. No clear etiology of reported hip pain. 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 PLAN: --Norco q4h PRN --Lidocaine patch --start gabapentin 100 mg TID, and titrate up per outpatient PCP --avoid IV pain meds  Abdominal Pain, resolved 2/2 Ischemic colitis # Chronic mesenteric ischemia # significant mesenteric artery disease --Reported on presentation, but resolved the next day. --AortoIliac Occlusion is chronic appearing. Mesenteric vessels IMA-70%; SMA 50%; Celiac-minimal also appear chronic. PLAN: --mesenteric angiogram today with Angioplasty of the SMA  --f/u vascular rec  Peripheral vascular disease Status post bilateral AKA Continue statins and ASA  Questionable GI bleed Iron-def anemia Patient presented for evaluation of abdominal pain, nausea and diarrhea. On presentation she  had a white count of 22,000 and a hemoglobin of 14.6 g/dl and in 5 hours her hemoglobin dropped to 11.7g/dl and she is heme positive.  Hgb drop likely due to dilution and blood draws, and may have had some blood in the stool from ischemic colitis. PLAN: --GI consult, no need for acute GI workup currently --IV iron 200 mg x1 today  Chronic systolic heart failure Last known LVEF 40 - 45% Resume home lisinopril   Essential hypertension Resume home lisinopril   Nicotine dependence and current smoker Patient states that 1 pack of cigarettes lasted 3 days Smoking cessation was discussed with her in detail She is unwilling to quit smoking at this time and declines a nicotine transdermal patch.  Leukocytosis, improved --Pt reported only 1 episode of diarrhea, none since presentation. --Hold abx and monitor for now   DVT prophylaxis: Lovenox SQ Code Status: Full code  Family Communication: daughter updated at bedside today Status is: inpatient Dispo:   The patient is from: home Anticipated d/c is to: home Anticipated d/c date is: tomorrow Patient currently is medically stable to d/c.   Subjective and Interval History:  Pt continued to complain only of right thigh pain.  Pt denied N/V/D and abdominal pain.  No diarrhea or blood in the stool.    Pt underwent mesenteric angiogram with vascular today.   Objective: Vitals:   03/02/20 1630 03/02/20 1645 03/02/20 1700 03/02/20 1744  BP: (!) 159/81 (!) 154/71 140/68 (!) 153/70  Pulse: 94 88 99 80  Resp: 14 15 18 14   Temp:    98 F (36.7 C)  TempSrc:    Oral  SpO2: 96%  98% 97% 99%  Weight:      Height:        Intake/Output Summary (Last 24 hours) at 03/02/2020 2037 Last data filed at 03/01/2020 2200 Gross per 24 hour  Intake 115.31 ml  Output --  Net 115.31 ml   Filed Weights   02/29/20 0134 02/29/20 2300  Weight: 47.2 kg 42.3 kg    Examination:   Constitutional: NAD, AAOx3 HEENT: conjunctivae and lids normal,  EOMI CV: RRR no M,R,G. Distal pulses +2.  No cyanosis.   RESP: CTA B/L, normal respiratory effort  GI: +BS, NTND Extremities: bilateral AKA, no edema or wound over right thigh/stump site. SKIN: warm, dry.  Faint erythematous rash present in all parts of her body. Neuro: II - XII grossly intact.  Sensation intact   Data Reviewed: I have personally reviewed following labs and imaging studies  CBC: Recent Labs  Lab 02/29/20 0139 02/29/20 0730 02/29/20 1540 03/01/20 0526 03/02/20 0402  WBC 22.0* 23.0*  --  20.9* 14.3*  HGB 14.6 11.7* 11.1* 11.0* 11.1*  HCT 46.3* 36.7 35.2* 33.5* 33.4*  MCV 92.6 91.5  --  89.1 88.1  PLT 307 229  --  228 295   Basic Metabolic Panel: Recent Labs  Lab 02/29/20 0139 03/01/20 0526 03/02/20 0402  NA 139 137 135  K 3.8 3.6 3.6  CL 107 111 108  CO2 20* 18* 20*  GLUCOSE 166* 90 97  BUN 16 12 12   CREATININE 0.94 0.57 0.64  CALCIUM 9.2 8.2* 8.3*  MG  --   --  1.6*   GFR: Estimated Creatinine Clearance: 18.9 mL/min (by C-G formula based on SCr of 0.64 mg/dL). Liver Function Tests: Recent Labs  Lab 02/29/20 0139  AST 19  ALT 11  ALKPHOS 106  BILITOT 0.6  PROT 7.2  ALBUMIN 3.8   No results for input(s): LIPASE, AMYLASE in the last 168 hours. No results for input(s): AMMONIA in the last 168 hours. Coagulation Profile: Recent Labs  Lab 02/29/20 1540  INR 1.1   Cardiac Enzymes: No results for input(s): CKTOTAL, CKMB, CKMBINDEX, TROPONINI in the last 168 hours. BNP (last 3 results) No results for input(s): PROBNP in the last 8760 hours. HbA1C: No results for input(s): HGBA1C in the last 72 hours. CBG: Recent Labs  Lab 02/29/20 2013  GLUCAP 86   Lipid Profile: No results for input(s): CHOL, HDL, LDLCALC, TRIG, CHOLHDL, LDLDIRECT in the last 72 hours. Thyroid Function Tests: No results for input(s): TSH, T4TOTAL, FREET4, T3FREE, THYROIDAB in the last 72 hours. Anemia Panel: Recent Labs    03/02/20 0402  TIBC 210*  IRON 18*    Sepsis Labs: Recent Labs  Lab 02/29/20 0708  LATICACIDVEN 1.6    Recent Results (from the past 240 hour(s))  Blood culture (routine x 2)     Status: None (Preliminary result)   Collection Time: 02/29/20  6:43 AM   Specimen: BLOOD  Result Value Ref Range Status   Specimen Description BLOOD RIGHT ANTECUBITAL  Final   Special Requests   Final    BOTTLES DRAWN AEROBIC AND ANAEROBIC Blood Culture adequate volume   Culture   Final    NO GROWTH 2 DAYS Performed at [facility], [address], [place], [place] [address]    Report Status PENDING  Incomplete  Blood culture (routine x 2)     Status: None (Preliminary result)   Collection Time: 02/29/20  7:08 AM   Specimen: BLOOD  Result Value Ref Range Status  Specimen Description BLOOD BLOOD RIGHT FOREARM  Final   Special Requests   Final    BOTTLES DRAWN AEROBIC AND ANAEROBIC Blood Culture adequate volume   Culture   Final    NO GROWTH 2 DAYS Performed at [facility], [address], [place], [place] [address]    Report Status PENDING  Incomplete  SARS Coronavirus 2 by RT PCR (hospital order, performed in [facility] hospital lab) Nasopharyngeal Nasopharyngeal Swab     Status: None   Collection Time: 02/29/20  8:35 PM   Specimen: Nasopharyngeal Swab  Result Value Ref Range Status   SARS Coronavirus 2 NEGATIVE NEGATIVE Final    Comment: (NOTE) SARS-CoV-2 target nucleic acids are NOT DETECTED. The SARS-CoV-2 RNA is generally detectable in upper and lower respiratory specimens during the acute phase of infection. The lowest concentration of SARS-CoV-2 viral copies this assay can detect is 250 copies / mL. A negative result does not preclude SARS-CoV-2 infection and should not be used as the sole basis for treatment or other patient management decisions.  A negative result may occur with improper specimen collection / handling, submission of specimen other than nasopharyngeal swab, presence of viral  mutation(s) within the areas targeted by this assay, and inadequate number of viral copies (<250 copies / mL). A negative result must be combined with clinical observations, patient history, and epidemiological information. Fact Sheet for Patients:   BoilerBrush.com.cy Fact Sheet for Healthcare Providers: https://pope.com/ This test is not yet approved or cleared  by the [place] FDA and has been authorized for detection and/or diagnosis of SARS-CoV-2 by FDA under an Emergency Use Authorization (EUA).  This EUA will remain in effect (meaning this test can be used) for the duration of the COVID-19 declaration under Section 564(b)(1) of the Act, 21 U.S.C. section 360bbb-3(b)(1), unless the authorization is terminated or revoked sooner. Performed at [facility], [address] [place], [place] [address]   MRSA PCR Screening     Status: None   Collection Time: 02/29/20  8:35 PM  Result Value Ref Range Status   MRSA by PCR NEGATIVE NEGATIVE Final    Comment:        The GeneXpert MRSA Assay (FDA approved for NASAL specimens only), is one component of a comprehensive MRSA colonization surveillance program. It is not intended to diagnose MRSA infection nor to guide or monitor treatment for MRSA infections. Performed at [facility], [address] [place], [place] [address]       Radiology Studies: PERIPHERAL VASCULAR CATHETERIZATION  Result Date: 03/02/2020 See op note    Scheduled Meds: . atorvastatin  40 mg Oral QHS  . cholecalciferol  1,000 Units Oral Daily  . enoxaparin (LOVENOX) injection  40 mg Subcutaneous Q24H  . fentaNYL      . fentaNYL      . heparin sodium (porcine)      . lidocaine  2 patch Transdermal Q24H  . midazolam       Continuous Infusions:    LOS: 2 days     Darlin Priestly, MD [facility] If 7PM-7AM, please contact night-coverage 03/02/2020, 8:37 PM

## 2020-03-03 ENCOUNTER — Telehealth: Payer: Self-pay

## 2020-03-03 DIAGNOSIS — R1084 Generalized abdominal pain: Secondary | ICD-10-CM

## 2020-03-03 DIAGNOSIS — K551 Chronic vascular disorders of intestine: Principal | ICD-10-CM

## 2020-03-03 LAB — CBC
HCT: 31 % — ABNORMAL LOW (ref 36.0–46.0)
Hemoglobin: 10.2 g/dL — ABNORMAL LOW (ref 12.0–15.0)
MCH: 29.4 pg (ref 26.0–34.0)
MCHC: 32.9 g/dL (ref 30.0–36.0)
MCV: 89.3 fL (ref 80.0–100.0)
Platelets: 189 10*3/uL (ref 150–400)
RBC: 3.47 MIL/uL — ABNORMAL LOW (ref 3.87–5.11)
RDW: 15.3 % (ref 11.5–15.5)
WBC: 13.8 10*3/uL — ABNORMAL HIGH (ref 4.0–10.5)
nRBC: 0 % (ref 0.0–0.2)

## 2020-03-03 LAB — BASIC METABOLIC PANEL
Anion gap: 8 (ref 5–15)
BUN: 10 mg/dL (ref 8–23)
CO2: 21 mmol/L — ABNORMAL LOW (ref 22–32)
Calcium: 7.9 mg/dL — ABNORMAL LOW (ref 8.9–10.3)
Chloride: 109 mmol/L (ref 98–111)
Creatinine, Ser: 0.64 mg/dL (ref 0.44–1.00)
GFR calc Af Amer: >60 mL/min (ref >60)
GFR calc non Af Amer: >60 mL/min (ref >60)
Glucose, Bld: 101 mg/dL — ABNORMAL HIGH (ref 70–99)
Potassium: 3.3 mmol/L — ABNORMAL LOW (ref 3.5–5.1)
Sodium: 138 mmol/L (ref 135–145)

## 2020-03-03 LAB — MAGNESIUM: Magnesium: 2 mg/dL (ref 1.7–2.4)

## 2020-03-03 MED ORDER — CLOPIDOGREL BISULFATE 75 MG PO TABS
75.0000 mg | ORAL_TABLET | Freq: Every day | ORAL | 1 refills | Status: DC
Start: 2020-03-03 — End: 2020-04-05

## 2020-03-03 MED ORDER — ENOXAPARIN SODIUM 30 MG/0.3ML ~~LOC~~ SOLN: 30.0000 mg | SUBCUTANEOUS | Status: DC

## 2020-03-03 MED ORDER — GABAPENTIN 100 MG PO CAPS
100.0000 mg | ORAL_CAPSULE | Freq: Three times a day (TID) | ORAL | Status: DC
  Administered 2020-03-03: 100 mg via ORAL
  Filled 2020-03-03: qty 1

## 2020-03-03 MED ORDER — POTASSIUM CHLORIDE CRYS ER 20 MEQ PO TBCR
40.0000 meq | EXTENDED_RELEASE_TABLET | Freq: Once | ORAL | Status: AC
  Administered 2020-03-03: 40 meq via ORAL
  Filled 2020-03-03: qty 2

## 2020-03-03 MED ORDER — LIDOCAINE 5 % EX PTCH: 2.0000 | MEDICATED_PATCH | CUTANEOUS | 0 refills | Status: DC

## 2020-03-03 MED ORDER — FERROUS SULFATE 325 (65 FE) MG PO TBEC
325.0000 mg | DELAYED_RELEASE_TABLET | Freq: Two times a day (BID) | ORAL | 3 refills | Status: DC
Start: 2020-03-03 — End: 2020-08-21

## 2020-03-03 MED ORDER — LISINOPRIL 20 MG PO TABS
20.0000 mg | ORAL_TABLET | Freq: Every day | ORAL | Status: DC
  Administered 2020-03-03: 20 mg via ORAL
  Filled 2020-03-03: qty 1

## 2020-03-03 MED ORDER — GABAPENTIN 100 MG PO CAPS: 100.0000 mg | ORAL_CAPSULE | Freq: Three times a day (TID) | ORAL | 1 refills | Status: DC

## 2020-03-03 MED ORDER — ASPIRIN EC 81 MG PO TBEC
81.0000 mg | DELAYED_RELEASE_TABLET | ORAL | Status: DC
  Administered 2020-03-03: 81 mg via ORAL
  Filled 2020-03-03: qty 1

## 2020-03-03 NOTE — Telephone Encounter (Signed)
Unable to confirm and screen for 03-05-20 ov called twice and patient did not have good reception.

## 2020-03-03 NOTE — Care Management Important Message (Signed)
Important Message  Patient Details  Name: [name] MRN: [id number] Date of Birth: [birth year]   Medicare Important Message Given:  Yes     [name] 03/03/2020, 11:05 AM

## 2020-03-03 NOTE — Progress Notes (Signed)
[facility] Daily Progress Note   Subjective: 03/02/20: 1. Ultrasound guidance for vascular access left radial artery 2. Catheter placement into SMA from right femoral approach 3. Aortogram and selective SMA angiogram 4. Angioplasty of the SMA with a 5 mm diameter by 4 cm Lutonix drug-coated angioplasty balloon  Without complaint. Denies abdominal pain, nausea or vomiting.   Objective: Vitals:   03/02/20 1700 03/02/20 1744 03/02/20 2116 03/03/20 0626  BP: 140/68 (!) 153/70 133/69 121/72  Pulse: 99 80 93 79  Resp: 18 14  18   Temp:  98 F (36.7 C) 98.1 F (36.7 C) 98.3 F (36.8 C)  TempSrc:  Oral    SpO2: 97% 99% 95% 94%  Weight:      Height:       No intake or output data in the 24 hours ending 03/03/20 0952  Physical Exam: A&Ox3, NAD CV: RRR Pulmonary: CTA Bilaterally Abdomen: Soft, Nontender, Nondistended Vascular:   Laboratory: CBC    Component Value Date/Time   WBC 13.8 (H) 03/03/2020 0604   HGB 10.2 (L) 03/03/2020 0604   HGB 8.6 (L) 02/12/2015 1641   HCT 31.0 (L) 03/03/2020 0604   HCT 25.9 (L) 02/12/2015 1641   PLT 189 03/03/2020 0604   PLT 343 02/12/2015 1641   BMET    Component Value Date/Time   NA 138 03/03/2020 0604   NA 131 (L) 02/13/2015 0426   K 3.3 (L) 03/03/2020 0604   K 3.0 (L) 02/13/2015 0426   CL 109 03/03/2020 0604   CL 103 02/13/2015 0426   CO2 21 (L) 03/03/2020 0604   CO2 23 02/13/2015 0426   GLUCOSE 101 (H) 03/03/2020 0604   GLUCOSE 104 (H) 02/13/2015 0426   BUN 10 03/03/2020 0604   BUN 10 02/13/2015 0426   CREATININE 0.64 03/03/2020 0604   CREATININE 0.83 02/13/2015 0426   CALCIUM 7.9 (L) 03/03/2020 0604   CALCIUM 7.3 (L) 02/13/2015 0426   GFRNONAA >60 03/03/2020 0604   GFRNONAA >60 02/13/2015 0426   GFRAA >60 03/03/2020 0604   GFRAA >60 02/13/2015 0426   Assessment/Planning: Patient is 74 year old female who presented with mesenteric artery stenosis  status post angioplasty of the SMA - POD#1  1) patient is on aspirin.  Would also add Plavix if okay with gastroenterology. 2) on statin. 3) we will see the patient in our clinic in approximately one month to continue to monitor  Discussed with Dr. 66 [facility] PA-C 03/03/2020 9:52 AM

## 2020-03-03 NOTE — Discharge Summary (Signed)
Physician Discharge Summary  [name]:[id number] DOB: 29-Dec-1945 DOA: 02/29/2020  PCP: Carlean Jews, NP  Admit date: 02/29/2020 Discharge date: 03/03/2020  Admitted From: Home Disposition: Home  Recommendations for Outpatient Follow-up:  1. Follow up with PCP in 1-2 weeks 2. Follow-up with vascular surgery. 3. Follow-up with GI 4. Please obtain BMP/CBC in one week 5. Please follow up on the following pending results: None  Home Health: No Equipment/Devices: Wheelchair Discharge Condition: Fair CODE STATUS: Full Diet recommendation: Heart Healthy   Brief/Interim Summary: [name] a 74 y.o.Caucasian femalewith medical history significant forperipheral arterial disease status post bilateral AKA, carotid artery disease and hypertension who presents to the ER for evaluation of sudden onset nausea associated with 2 episodes of nonbloody diarrhea.Symptoms were acute in onset and was associated with diaphoresis and shortness of breath.She complained of abdominal pain which she rated 10 x 10 in intensity at its worst with radiation to her right leg. Patient has a generalized nonpruritic rash which she has had since after she had a CT with contrast doneon 02/23/20. Found to have mesenteric ischemia.  CTA with AortoIliac Occlusion is chronic appearing. Mesenteric vessels IMA-70%; SMA 50%; Celiac-minimal also appear chronic.  He underwent mesenteric angiography followed by angioplasty of SMA.  Patient tolerated the procedure well with resolution of symptoms.  He was also started on Plavix along with aspirin per vascular surgery recommendations and will follow up with them as an outpatient.  Has an history of extensive peripheral vascular disease and have bilateral AKA's.  She was complaining of right stump pain which was thought to be a phantom pain and she was started on gabapentin.  Her PCP should be able to titrate as needed.  She was also given lidocaine  patch.  Patient has iron deficiency anemia without any obvious bleeding.  Guaiac test was positive most likely some bleeding with ischemic colitis.  GI was consulted and they are recommending outpatient follow-up.  Patient will continue with iron supplement.  We will continue with rest of her home meds and follow-up with primary care physician.  Discharge Diagnoses:  Principal Problem:   GI bleed Active Problems:   Chronic systolic heart failure (HCC)   Tobacco use   Essential hypertension   Peripheral vascular disease (HCC)   Acute GI bleeding   Nicotine dependence   Generalized abdominal pain   Discharge Instructions  Discharge Instructions    Diet - low sodium heart healthy   Complete by: As directed    Discharge instructions   Complete by: As directed    It was pleasure taking care of you. According to your vascular surgeon advise we are starting you on Plavix along with aspirin, both of these medications are needed for patency of your stent.  They can increase the chances of bleeding, if you experience any bleeding please stop using these and seek medical attention. We are also giving you some iron supplement to take it twice daily. Please follow-up with gastroenterology as an outpatient for possible colonoscopy. We also started you on gabapentin teen for your phantom pain, you can follow-up with your primary care physician and they can titrate the dose as needed.   Increase activity slowly   Complete by: As directed      Allergies as of 03/03/2020      Reactions   Contrast Media [iodinated Diagnostic Agents] Rash   Morphine And Related Diarrhea, Nausea And Vomiting      Medication List    STOP taking  these medications   ergocalciferol 1.25 MG (50000 UT) capsule Commonly known as: Drisdol     TAKE these medications   aspirin 81 MG tablet Take 81 mg by mouth every morning.   atorvastatin 40 MG tablet Commonly known as: LIPITOR Take 1 tablet (40 mg total) by  mouth at bedtime.   cholecalciferol 25 MCG (1000 UNIT) tablet Commonly known as: VITAMIN D3 Take 1,000 Units by mouth daily.   clopidogrel 75 MG tablet Commonly known as: PLAVIX Take 1 tablet (75 mg total) by mouth daily.   famotidine 20 MG tablet Commonly known as: PEPCID Take 1 tablet (20 mg total) by mouth every morning.   ferrous sulfate 325 (65 FE) MG EC tablet Take 1 tablet (325 mg total) by mouth 2 (two) times daily.   gabapentin 100 MG capsule Commonly known as: NEURONTIN Take 1 capsule (100 mg total) by mouth 3 (three) times daily.   HYDROcodone-acetaminophen 5-325 MG tablet Commonly known as: NORCO/VICODIN Take 1 tablet by mouth every 6 (six) hours as needed for moderate pain.   lidocaine 5 % Commonly known as: LIDODERM Place 2 patches onto the skin daily. Remove & Discard patch within 12 hours or as directed by MD   lisinopril 20 MG tablet Commonly known as: ZESTRIL Take 1 tablet (20 mg total) by mouth daily.   magnesium hydroxide 400 MG/5ML suspension Commonly known as: MILK OF MAGNESIA Take 30 mLs by mouth daily as needed for mild constipation.   traMADol 50 MG tablet Commonly known as: ULTRAM Take 50 mg by mouth as needed.      Follow-up Information    Schnier, Latina Craver, MD Follow up in 1 month(s).   Specialties: Vascular Surgery, Cardiology, Radiology, Vascular Surgery Why: To see Schnier. Will need mesenteric.  Contact information: [address] [place] [address] [phone]        Carlean Jews, NP. Schedule an appointment as soon as possible for a visit.   Specialty: Family Medicine Contact information: [address] [place] [address] [phone]        Midge Minium, MD. Schedule an appointment as soon as possible for a visit.   Specialty: Gastroenterology Contact information: [address] [place]  [place] [address] [phone]          Allergies  Allergen Reactions  . Contrast Media [Iodinated Diagnostic  Agents] Rash  . Morphine And Related Diarrhea and Nausea And Vomiting    Consultations:  Vascular surgery  GI  Procedures/Studies: CT ABDOMEN PELVIS WO CONTRAST  Result Date: 02/29/2020 CLINICAL DATA:  Abdominal pain, nausea and diarrhea. EXAM: CT ABDOMEN AND PELVIS WITHOUT CONTRAST TECHNIQUE: Multidetector CT imaging of the abdomen and pelvis was performed following the standard protocol without IV contrast. COMPARISON:  CT abdomen dated 09/12/2006. FINDINGS: Lower chest: Chronic scarring/atelectasis at each lung base. Hepatobiliary: Small layering stones within the otherwise normal-appearing gallbladder. No focal liver abnormality. No bile duct dilatation seen. Pancreas: Unremarkable. No pancreatic ductal dilatation or surrounding inflammatory changes. Spleen: Normal in size without focal abnormality. Adrenals/Urinary Tract: Chronic LEFT-sided hydronephrosis, severe in degree. Multiple dystrophic renal stones within the LEFT renal pelvis, largest measuring 1.4 cm. No RIGHT-sided hydronephrosis. Bladder appears normal. Several small calcifications within the LEFT lower pelvis, largest measuring 4 mm, of uncertain relationship to the distal LEFT ureter, but more likely vascular calcifications just outside of the distal LEFT ureter. No hydroureter. Stomach/Bowel: No dilated large or small bowel loops. Scattered diverticulosis of the LEFT colon but no focal inflammatory change seen to  suggest acute diverticulitis at this time. No bowel wall thickening, mesenteric fluid or other secondary signs of active bowel wall inflammation. Moderate amount of stool and gas throughout the colon. Stomach is unremarkable, partially decompressed. Appendix is not seen but there are no inflammatory changes about the cecum to suggest acute appendicitis. Vascular/Lymphatic: Aortic atherosclerosis. 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 Bilateral iliac stents in place.  Reproductive: No adnexal mass or free fluid. Other: No free fluid or abscess collection identified. No free intraperitoneal air. Musculoskeletal: No acute or suspicious osseous finding. Superficial soft tissues are unremarkable. Soft tissues of the upper thighs are unremarkable. IMPRESSION: 1. No acute findings within the abdomen or pelvis. No bowel obstruction or evidence of active bowel wall inflammation. No free fluid or inflammatory change. No evidence of acute solid organ abnormality. No renal or ureteral calculi. 2. Chronic LEFT-sided hydronephrosis, severe in degree, possibly congenital. Multiple LEFT renal stones, largest measuring 1.4 cm. No obstructing ureteral stone. 3. Colonic diverticulosis without evidence of acute diverticulitis at this time. 4. Cholelithiasis without evidence of acute cholecystitis. 5. 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 Aortic Atherosclerosis (ICD10-I70.0). Electronically Signed   By: Franki Cabot M.D.   On: 02/29/2020 04:17   CT ANGIO NECK W OR WO CONTRAST  Result Date: 02/23/2020 CLINICAL DATA:  Bilateral carotid artery stenosis EXAM: CT ANGIOGRAPHY NECK TECHNIQUE: Multidetector CT imaging of the neck was performed using the standard protocol during bolus administration of intravenous contrast. Multiplanar CT image reconstructions and MIPs were obtained to evaluate the vascular anatomy. Carotid stenosis measurements (when applicable) are obtained utilizing NASCET criteria, using the distal internal carotid diameter as the denominator. CONTRAST:  69mL OMNIPAQUE IOHEXOL 350 MG/ML SOLN COMPARISON:  None. FINDINGS: Aortic arch: Great vessel origins are patent. Right carotid system: Patent. Common carotid demonstrates atherosclerotic wall thickening and mild calcified plaque. There is noncalcified plaque at the proximal ICA causing approximately 60% stenosis. Remainder of the cervical ICA is patent. There is moderate stenosis at  the external carotid origin. Left carotid system: Patent. Proximal common carotid is poorly evaluated due to streak artifact from adjacent venous contrast. There is tandem primarily noncalcified plaque at the ICA origin causing approximately 50% stenosis. Vertebral arteries: Patent and codominant. There is plaque near the origins with mild stenosis. Minimal calcified plaque along the V2 segments. Skeleton: Multilevel cervical spine degenerative changes. Other neck: Small subcentimeter thyroid nodules for which no further follow-up is recommended. Upper chest: Advanced emphysema. IMPRESSION: Plaque at the proximal right ICA causing approximately 60% stenosis. Tandem plaque at the proximal left ICA causing approximately 50% stenosis. Electronically Signed   By: Macy Mis M.D.   On: 02/23/2020 13:07   PERIPHERAL VASCULAR CATHETERIZATION  Result Date: 03/02/2020 See op note  CT Angio Abd/Pel W and/or Wo Contrast  Result Date: 02/29/2020 CLINICAL DATA:  Severe abdominal pain. Evaluate ischemia. For mesenteric EXAM: CTA ABDOMEN AND PELVIS WITHOUT AND WITH CONTRAST TECHNIQUE: Multidetector CT imaging of the abdomen and pelvis was performed using the standard protocol during bolus administration of intravenous contrast. Multiplanar reconstructed images and MIPs were obtained and reviewed to evaluate the vascular anatomy. CONTRAST:  83mL OMNIPAQUE IOHEXOL 350 MG/ML SOLN History contrast allergy; underwent emergency steroid prep and received contrast without incident. COMPARISON:  CT abdomen pelvis - earlier same day; 09/12/2006 FINDINGS: VASCULAR Aorta: There is a large amount of circumferential predominantly noncalcified atherosclerotic plaque throughout the abdominal aorta which ultimately occludes at the level  of the takeoff of the IMA. No associated perivascular stranding. Celiac: There is a moderate amount of eccentric mixed calcified and noncalcified atherosclerotic plaque involving the origin the celiac  artery, not resulting in a hemodynamically significant stenosis. Conventional branching pattern. SMA: There is a moderate to large amount of eccentric mixed calcified and noncalcified atherosclerotic plaque involving the origin and main trunk of the SMA which results in approximately 50% luminal narrowing approximately 3 cm distal to the origin (axial image 37, series 4; sagittal image 80, series 9). The distal tributaries of the SMA appear patent without discrete intraluminal filling defect with special attention paid to the middle and left colic branches. 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 The dominant right renal artery is widely patent without hemodynamically significant stenosis. The left renal artery is diffusely atrophic likely secondary to left renal atrophy due to chronic left-sided UPJ obstruction/stenosis. No discrete areas of vessel irregularity to suggest FMD. IMA: Diseased at its origin with focal severe (approximately 70%) luminal narrowing (image 66, series 4), with associated poststenotic dilatation. The distal tributaries of the IMA appear patent without discrete intraluminal filling defect. Inflow: As above, the distal aspect of the abdominal aorta is occluded caudal to the take-off of the IMA. Patient has undergone previous overlapping stenting of the bilateral common and external iliac arteries however both of the common and external iliac arteries are occluded throughout their courses. There is minimal atretic recanalization of the distal aspects of the bilateral internal iliac arteries likely via trans pelvic arterial collaterals. Proximal Outflow: There is no appreciable arterial flow to either lower extremity. Veins: The IVC and pelvic venous systems appear widely patent. Review of the MIP images confirms the above findings. _________________________________________________________  NON-VASCULAR Lower chest: Limited visualization of lower thorax demonstrates advanced emphysematous change with bibasilar subsegmental atelectasis and mild bibasilar bronchiectasis. No discrete focal airspace opacities. No pleural effusion. Cardiomegaly.  No pericardial effusion. Hepatobiliary: Normal hepatic contour. No discrete hepatic lesions. Several layering gallstones are seen within otherwise normal-appearing gallbladder. No intra or extrahepatic biliary ductal dilatation. No ascites. Pancreas: Normal appearance of the pancreas. Spleen: Normal appearance of the spleen. Adrenals/Urinary Tract: There is symmetric enhancement of the bilateral kidneys. Chronic left-sided UPJ narrowing/obstruction with associated upstream marked pelvicaliectasis and asymmetric left renal atrophy and compensatory hypertrophy of the right kidney, progressed compared to remote examination performed in 2007. Several layering nonobstructing stones are seen within dilated inferior pole calices with dominant left-sided renal stone measuring approximately 1.5 x 0.7 x 0.7 cm (axial image 63, series 6; coronal image 56, series 10). Potential punctate (approximately 2 mm) nonobstructing right-sided renal stone. Additional calcifications about the right renal hila are favored to be vascular in etiology. No evidence of right-sided urinary obstruction. Normal appearance of the bilateral adrenal glands. Normal appearance of the urinary bladder given degree distention. Stomach/Bowel: Evaluation of intestines is degraded secondary to lack of significant mesenteric fat as well as enteric contrast. Colonic diverticulosis without evidence superimposed acute diverticulitis. Circumferential wall thickening involving the distal aspect of the transverse colon extending to involve the mid descending colon (axial images 25, 35, 48, 51, 67, series 6; coronal images 37, 49 and 56, series 10). No evidence of enteric obstruction. No pneumoperitoneum,  pneumatosis or portal venous gas. Lymphatic: No definitive bulky retroperitoneal, mesenteric or pelvic lymphadenopathy. Reproductive: Dystrophic calcification of the left adnexa. Otherwise, normal appearance of the pelvic organs for age. No free  fluid the pelvic cul-de-sac. Other: Mild diffuse body wall anasarca. Musculoskeletal: No acute or aggressive osseous abnormalities. Mild to moderate multilevel lumbar spine DDD, worse at L5-S1 with disc space height loss, endplate irregularity and sclerosis. Old mild (approximately 25%) compression deformity involving the superior endplate of the L3 vertebral body (sagittal image 72, series 9, without associated acute fracture line or paraspinal hematoma. IMPRESSION: VASCULAR 1. Age-indeterminate occlusion of the distal aspect of the abdominal aorta caudal to the take-off of the IMA. There is complete occlusion of the bilateral common and external iliac arteries with no significant arterial flow to either lower extremity. 2. High-grade (approximately 70%) narrowing involving the origin of the IMA with associated poststenotic dilatation. 3. Focal approximately 50% luminal narrowing involving the mid aspect of the SMA approximately 3 cm peripheral to its origin. 4. Minimal amount of atherosclerotic plaque involves the origin the celiac artery, not resulting in a hemodynamically significant stenosis. 5. Cardiomegaly. 6.  Aortic Atherosclerosis (ICD10-I70.0). NON-VASCULAR 1. Apparent circumferential wall thickening involving the distal half of the transverse colon extending to involve the descending colon without of enteric obstruction or definable/drainable fluid collection. Findings are nonspecific though could be seen in the setting of an enteritis potentially of ischemic (favored, given associated vasculopathy), infectious or inflammatory etiology. Currently there is no evidence enteric perforation, pneumatosis or portal venous gas. Correlation with lactic acid level and/or  colonoscopy could be performed as indicated. 2. Colonic diverticulosis without evidence superimposed acute diverticulitis. 3. 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 4. Nonobstructing bilateral nephrolithiasis, left greater than right. 5. Cholelithiasis without evidence cholecystitis. Critical Value/emergent results were called by telephone at the time of interpretation on 02/29/2020 at 2:32 pm to provider Willy Eddy , who verbally acknowledged these results. Electronically Signed   By: Simonne Come M.D.   On: 02/29/2020 14:36     Subjective: Patient has no new complaints today.  She would like to go back home.  Discharge Exam: Vitals:   03/02/20 2116 03/03/20 0626  BP: 133/69 121/72  Pulse: 93 79  Resp:  18  Temp: 98.1 F (36.7 C) 98.3 F (36.8 C)  SpO2: 95% 94%   Vitals:   03/02/20 1700 03/02/20 1744 03/02/20 2116 03/03/20 0626  BP: 140/68 (!) 153/70 133/69 121/72  Pulse: 99 80 93 79  Resp: 18 14  18   Temp:  98 F (36.7 C) 98.1 F (36.7 C) 98.3 F (36.8 C)  TempSrc:  Oral    SpO2: 97% 99% 95% 94%  Weight:      Height:        General: Pt is alert, awake, not in acute distress Cardiovascular: RRR, S1/S2 +, no rubs, no gallops Respiratory: CTA bilaterally, no wheezing, no rhonchi Abdominal: Soft, NT, ND, bowel sounds + Extremities: Bilateral AKA.   The results of significant diagnostics from this hospitalization (including imaging, microbiology, ancillary and laboratory) are listed below for reference.    Microbiology: Recent Results (from the past 240 hour(s))  Blood culture (routine x 2)     Status: None (Preliminary result)   Collection Time: 02/29/20  6:43 AM   Specimen: BLOOD  Result Value Ref Range Status   Specimen Description BLOOD RIGHT ANTECUBITAL  Final   Special Requests   Final    BOTTLES DRAWN AEROBIC AND ANAEROBIC Blood Culture adequate volume   Culture   Final    NO GROWTH 3  DAYS Performed at [facility], [address] [place], [place]  Report Status PENDING  Incomplete  Blood culture (routine x 2)     Status: None (Preliminary result)   Collection Time: 02/29/20  7:08 AM   Specimen: BLOOD  Result Value Ref Range Status   Specimen Description BLOOD BLOOD RIGHT FOREARM  Final   Special Requests   Final    BOTTLES DRAWN AEROBIC AND ANAEROBIC Blood Culture adequate volume   Culture   Final    NO GROWTH 3 DAYS Performed at [facility], [address] [place], [place] [address]    Report Status PENDING  Incomplete  SARS Coronavirus 2 by RT PCR (hospital order, performed in [facility] hospital lab) Nasopharyngeal Nasopharyngeal Swab     Status: None   Collection Time: 02/29/20  8:35 PM   Specimen: Nasopharyngeal Swab  Result Value Ref Range Status   SARS Coronavirus 2 NEGATIVE NEGATIVE Final    Comment: (NOTE) SARS-CoV-2 target nucleic acids are NOT DETECTED. The SARS-CoV-2 RNA is generally detectable in upper and lower respiratory specimens during the acute phase of infection. The lowest concentration of SARS-CoV-2 viral copies this assay can detect is 250 copies / mL. A negative result does not preclude SARS-CoV-2 infection and should not be used as the sole basis for treatment or other patient management decisions.  A negative result may occur with improper specimen collection / handling, submission of specimen other than nasopharyngeal swab, presence of viral mutation(s) within the areas targeted by this assay, and inadequate number of viral copies (<250 copies / mL). A negative result must be combined with clinical observations, patient history, and epidemiological information. Fact Sheet for Patients:   BoilerBrush.com.cyhttps://www.fda.gov/media/136312/download Fact Sheet for Healthcare Providers: https://pope.com/https://www.fda.gov/media/136313/download This test is not yet approved or cleared  by the [place] FDA and has been authorized  for detection and/or diagnosis of SARS-CoV-2 by FDA under an Emergency Use Authorization (EUA).  This EUA will remain in effect (meaning this test can be used) for the duration of the COVID-19 declaration under Section 564(b)(1) of the Act, 21 U.S.C. section 360bbb-3(b)(1), unless the authorization is terminated or revoked sooner. Performed at [facility], [address] [place], [place] [address]   MRSA PCR Screening     Status: None   Collection Time: 02/29/20  8:35 PM  Result Value Ref Range Status   MRSA by PCR NEGATIVE NEGATIVE Final    Comment:        The GeneXpert MRSA Assay (FDA approved for NASAL specimens only), is one component of a comprehensive MRSA colonization surveillance program. It is not intended to diagnose MRSA infection nor to guide or monitor treatment for MRSA infections. Performed at [facility], [address] [place], [place] [address]      Labs: BNP (last 3 results) No results for input(s): BNP in the last 8760 hours. Basic Metabolic Panel: Recent Labs  Lab 02/29/20 0139 03/01/20 0526 03/02/20 0402 03/03/20 0604  NA 139 137 135 138  K 3.8 3.6 3.6 3.3*  CL 107 111 108 109  CO2 20* 18* 20* 21*  GLUCOSE 166* 90 97 101*  BUN 16 12 12 10   CREATININE 0.94 0.57 0.64 0.64  CALCIUM 9.2 8.2* 8.3* 7.9*  MG  --   --  1.6* 2.0   Liver Function Tests: Recent Labs  Lab 02/29/20 0139  AST 19  ALT 11  ALKPHOS 106  BILITOT 0.6  PROT 7.2  ALBUMIN 3.8   No results for input(s): LIPASE, AMYLASE in the last 168 hours. No results  for input(s): AMMONIA in the last 168 hours. CBC: Recent Labs  Lab 02/29/20 0139 02/29/20 0139 02/29/20 0730 02/29/20 1540 03/01/20 0526 03/02/20 0402 03/03/20 0604  WBC 22.0*  --  23.0*  --  20.9* 14.3* 13.8*  HGB 14.6   < > 11.7* 11.1* 11.0* 11.1* 10.2*  HCT 46.3*   < > 36.7 35.2* 33.5* 33.4* 31.0*  MCV 92.6  --  91.5  --  89.1 88.1 89.3  PLT 307  --  229  --  228 202 189   < > = values  in this interval not displayed.   Cardiac Enzymes: No results for input(s): CKTOTAL, CKMB, CKMBINDEX, TROPONINI in the last 168 hours. BNP: Invalid input(s): POCBNP CBG: Recent Labs  Lab 02/29/20 2013  GLUCAP 86   D-Dimer No results for input(s): DDIMER in the last 72 hours. Hgb A1c No results for input(s): HGBA1C in the last 72 hours. Lipid Profile No results for input(s): CHOL, HDL, LDLCALC, TRIG, CHOLHDL, LDLDIRECT in the last 72 hours. Thyroid function studies No results for input(s): TSH, T4TOTAL, T3FREE, THYROIDAB in the last 72 hours.  Invalid input(s): FREET3 Anemia work up Recent Labs    03/02/20 0402  TIBC 210*  IRON 18*   Urinalysis    Component Value Date/Time   COLORURINE Yellow 02/12/2015 2217   APPEARANCEUR Cloudy 02/12/2015 2217   LABSPEC 1.015 02/12/2015 2217   PHURINE 7.0 02/12/2015 2217   GLUCOSEU Negative 02/12/2015 2217   HGBUR 2+ 02/12/2015 2217   BILIRUBINUR Negative 02/12/2015 2217   KETONESUR Negative 02/12/2015 2217   PROTEINUR 100 mg/dL 40/07/2724 3664   NITRITE Positive 02/12/2015 2217   LEUKOCYTESUR 3+ 02/12/2015 2217   Sepsis Labs Invalid input(s): PROCALCITONIN,  WBC,  LACTICIDVEN Microbiology Recent Results (from the past 240 hour(s))  Blood culture (routine x 2)     Status: None (Preliminary result)   Collection Time: 02/29/20  6:43 AM   Specimen: BLOOD  Result Value Ref Range Status   Specimen Description BLOOD RIGHT ANTECUBITAL  Final   Special Requests   Final    BOTTLES DRAWN AEROBIC AND ANAEROBIC Blood Culture adequate volume   Culture   Final    NO GROWTH 3 DAYS Performed at [facility], [address] [place], [place] [address]    Report Status PENDING  Incomplete  Blood culture (routine x 2)     Status: None (Preliminary result)   Collection Time: 02/29/20  7:08 AM   Specimen: BLOOD  Result Value Ref Range Status   Specimen Description BLOOD BLOOD RIGHT FOREARM  Final   Special Requests   Final     BOTTLES DRAWN AEROBIC AND ANAEROBIC Blood Culture adequate volume   Culture   Final    NO GROWTH 3 DAYS Performed at [facility], [address] [place], [place] [address]    Report Status PENDING  Incomplete  SARS Coronavirus 2 by RT PCR (hospital order, performed in [facility] hospital lab) Nasopharyngeal Nasopharyngeal Swab     Status: None   Collection Time: 02/29/20  8:35 PM   Specimen: Nasopharyngeal Swab  Result Value Ref Range Status   SARS Coronavirus 2 NEGATIVE NEGATIVE Final    Comment: (NOTE) SARS-CoV-2 target nucleic acids are NOT DETECTED. The SARS-CoV-2 RNA is generally detectable in upper and lower respiratory specimens during the acute phase of infection. The lowest concentration of SARS-CoV-2 viral copies this assay can detect is 250 copies / mL. A negative result does not preclude SARS-CoV-2 infection and  should not be used as the sole basis for treatment or other patient management decisions.  A negative result may occur with improper specimen collection / handling, submission of specimen other than nasopharyngeal swab, presence of viral mutation(s) within the areas targeted by this assay, and inadequate number of viral copies (<250 copies / mL). A negative result must be combined with clinical observations, patient history, and epidemiological information. Fact Sheet for Patients:   BoilerBrush.com.cy Fact Sheet for Healthcare Providers: https://pope.com/ This test is not yet approved or cleared  by the [place] FDA and has been authorized for detection and/or diagnosis of SARS-CoV-2 by FDA under an Emergency Use Authorization (EUA).  This EUA will remain in effect (meaning this test can be used) for the duration of the COVID-19 declaration under Section 564(b)(1) of the Act, 21 U.S.C. section 360bbb-3(b)(1), unless the authorization is terminated or revoked sooner. Performed at [facility], [address] [place], [place] [address]   MRSA PCR Screening     Status: None   Collection Time: 02/29/20  8:35 PM  Result Value Ref Range Status   MRSA by PCR NEGATIVE NEGATIVE Final    Comment:        The GeneXpert MRSA Assay (FDA approved for NASAL specimens only), is one component of a comprehensive MRSA colonization surveillance program. It is not intended to diagnose MRSA infection nor to guide or monitor treatment for MRSA infections. Performed at [facility], [address] [place], [place] [address]     Time coordinating discharge: Over 30 minutes  SIGNED:  Arnetha Courser, MD  [facility] 03/03/2020, 12:55 PM  If 7PM-7AM, please contact night-coverage [email or web address]  This record has been created using Conservation officer, historic buildings. Errors have been sought and corrected,but may not always be located. Such creation errors do not reflect on the standard of care.

## 2020-03-03 NOTE — Progress Notes (Signed)
Whitney [name]  [name] and O x 4 VSS. Pt tolerating diet well. No complaints of pain or nausea. IV removed intact, prescriptions given. Pt voices understanding of discharge instructions with no further questions. Pt discharged via wheelchair with axillary.   Allergies as of 03/03/2020      Reactions   Contrast Media [iodinated Diagnostic Agents] Rash   Morphine And Related Diarrhea, Nausea And Vomiting      Medication List    STOP taking these medications   ergocalciferol 1.25 MG (50000 UT) capsule Commonly known as: Drisdol     TAKE these medications   aspirin 81 MG tablet Take 81 mg by mouth every morning.   atorvastatin 40 MG tablet Commonly known as: LIPITOR Take 1 tablet (40 mg total) by mouth at bedtime.   cholecalciferol 25 MCG (1000 UNIT) tablet Commonly known as: VITAMIN D3 Take 1,000 Units by mouth daily.   clopidogrel 75 MG tablet Commonly known as: PLAVIX Take 1 tablet (75 mg total) by mouth daily.   famotidine 20 MG tablet Commonly known as: PEPCID Take 1 tablet (20 mg total) by mouth every morning.   ferrous sulfate 325 (65 FE) MG EC tablet Take 1 tablet (325 mg total) by mouth 2 (two) times daily.   gabapentin 100 MG capsule Commonly known as: NEURONTIN Take 1 capsule (100 mg total) by mouth 3 (three) times daily.   HYDROcodone-acetaminophen 5-325 MG tablet Commonly known as: NORCO/VICODIN Take 1 tablet by mouth every 6 (six) hours as needed for moderate pain.   lidocaine 5 % Commonly known as: LIDODERM Place 2 patches onto the skin daily. Remove & Discard patch within 12 hours or as directed by MD   lisinopril 20 MG tablet Commonly known as: ZESTRIL Take 1 tablet (20 mg total) by mouth daily.   magnesium hydroxide 400 MG/5ML suspension Commonly known as: MILK OF MAGNESIA Take 30 mLs by mouth daily as needed for mild constipation.   traMADol 50 MG tablet Commonly known as: ULTRAM Take 50 mg by mouth as needed.       Vitals:   03/03/20  0626 03/03/20 1345  BP: 121/72 (!) 108/58  Pulse: 79 90  Resp: 18 20  Temp: 98.3 F (36.8 C) 98.2 F (36.8 C)  SpO2: 94% 97%    Whitney [name] Whitney [name]

## 2020-03-04 ENCOUNTER — Telehealth: Payer: Self-pay

## 2020-03-04 NOTE — Telephone Encounter (Signed)
Confirmed appointment on 03/05/2020. klh

## 2020-03-05 ENCOUNTER — Ambulatory Visit (INDEPENDENT_AMBULATORY_CARE_PROVIDER_SITE_OTHER): Payer: Medicare HMO | Admitting: Nurse Practitioner

## 2020-03-05 ENCOUNTER — Encounter: Payer: Self-pay | Admitting: Cardiology

## 2020-03-05 ENCOUNTER — Other Ambulatory Visit: Payer: Self-pay

## 2020-03-05 VITALS — BP 142/76 | HR 91 | Temp 97.4°F | Resp 16

## 2020-03-05 DIAGNOSIS — Z09 Encounter for follow-up examination after completed treatment for conditions other than malignant neoplasm: Secondary | ICD-10-CM | POA: Diagnosis not present

## 2020-03-05 DIAGNOSIS — I6523 Occlusion and stenosis of bilateral carotid arteries: Secondary | ICD-10-CM

## 2020-03-05 DIAGNOSIS — I700 Atherosclerosis of aorta: Secondary | ICD-10-CM

## 2020-03-05 DIAGNOSIS — I739 Peripheral vascular disease, unspecified: Secondary | ICD-10-CM | POA: Diagnosis not present

## 2020-03-05 DIAGNOSIS — T7840XD Allergy, unspecified, subsequent encounter: Secondary | ICD-10-CM

## 2020-03-05 LAB — CULTURE, BLOOD (ROUTINE X 2)
Culture: NO GROWTH
Culture: NO GROWTH
Special Requests: ADEQUATE
Special Requests: ADEQUATE

## 2020-03-05 MED ORDER — PREDNISONE 10 MG (21) PO TBPK: ORAL_TABLET | ORAL | 0 refills | Status: DC

## 2020-03-05 NOTE — Progress Notes (Signed)
[facility] [address] [place], [place] [address]  Internal MEDICINE  Office Visit Note  Patient Name: [name]  [id number]  [id number]  Date of Service: 03/07/2020  Transitional care from hospitalization.   Chief Complaint  Patient presents with  . Hospitalization Follow-up    abdominal pain      The patient is here for hospital follow up. She was admitted through the ER after having an allergic reaction to contrast dye she received during CTA of the neck. She had severe rash and generalized edema. She gelt as though her throat was closing up. She could not breathe. Her CT scan of the neck did show plaque distributed in both carotid arteries. There was 60% stenosis Tandem plaque at the proximal left ICA causing approximately 50% stenosis. A cta of the abdomen and pelvis was also performed. Results indicate VASCULAR  1. Age-indeterminate occlusion of the distal aspect of the abdominal aorta caudal to the take-off of the IMA. There is complete occlusion of the bilateral common and external iliac arteries with no significant arterial flow to either lower extremity. 2. High-grade (approximately 70%) narrowing involving the origin of the IMA with associated poststenotic dilatation. 3. Focal approximately 50% luminal narrowing involving the mid aspect of the SMA approximately 3 cm peripheral to its origin. 4. Minimal amount of atherosclerotic plaque involves the origin the celiac artery, not resulting in a hemodynamically significant stenosis. 5. Cardiomegaly. 6.  Aortic Atherosclerosis (ICD10-I70.0).  NON-VASCULAR  1. Apparent circumferential wall thickening involving the distal half of the transverse colon extending to involve the descending colon without of enteric obstruction or definable/drainable fluid collection. Findings are nonspecific though could be seen in the setting of an enteritis potentially of ischemic (favored, given associated  vasculopathy), infectious or inflammatory etiology. Currently there is no evidence enteric perforation, pneumatosis or portal venous gas. Correlation with lactic acid level and/or colonoscopy could be performed as indicated. 2. Colonic diverticulosis without evidence superimposed acute diverticulitis. 3. 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 4. Nonobstructing bilateral nephrolithiasis, left greater than right. 5. Cholelithiasis without evidence cholecystitis.  As a result of these findings, a visceral angiography was performed per Dr. Lorretta Harp to resolve occlusion in vessel. The patient will also see GI for consultation on 04/07/2020.  Today, the patient states that she has a great deal of itching all over the body. She also continues to have some generalized swelling. She is using topical hydrocortisone cream and calamine lotion. These are helping some. She denies any pain of any sort at this time. She states that she feels well, she just never wants to have a CT with contrast ever again.   Pt is here for recent hospital follow up.  Current Medication: Outpatient Encounter Medications as of 03/05/2020  Medication Sig  . aspirin 81 MG tablet Take 81 mg by mouth every morning.   Marland Kitchen atorvastatin (LIPITOR) 40 MG tablet Take 1 tablet (40 mg total) by mouth at bedtime.  . cholecalciferol (VITAMIN D3) 25 MCG (1000 UNIT) tablet Take 1,000 Units by mouth daily.  . clopidogrel (PLAVIX) 75 MG tablet Take 1 tablet (75 mg total) by mouth daily.  . famotidine (PEPCID) 20 MG tablet Take 1 tablet (20 mg total) by mouth every morning.  . ferrous sulfate 325 (65 FE) MG EC tablet Take 1 tablet (325 mg total) by mouth 2 (two) times daily.  Marland Kitchen gabapentin (NEURONTIN) 100 MG capsule Take 1 capsule (100 mg total) by mouth  3 (three) times daily.  Marland Kitchen HYDROcodone-acetaminophen (NORCO/VICODIN) 5-325 MG tablet Take 1 tablet by mouth every 6 (six) hours as  needed for moderate pain.  Marland Kitchen lidocaine (LIDODERM) 5 % Place 2 patches onto the skin daily. Remove & Discard patch within 12 hours or as directed by MD  . lisinopril (ZESTRIL) 20 MG tablet Take 1 tablet (20 mg total) by mouth daily.  . magnesium hydroxide (MILK OF MAGNESIA) 400 MG/5ML suspension Take 30 mLs by mouth daily as needed for mild constipation.  . traMADol (ULTRAM) 50 MG tablet Take 50 mg by mouth as needed.  . predniSONE (STERAPRED UNI-PAK 21 TAB) 10 MG (21) TBPK tablet 6 day taper - take by mouth as directed for 6 days   No facility-administered encounter medications on file as of 03/05/2020.    Surgical History: Past Surgical History:  Procedure Laterality Date  . ABOVE KNEE LEG AMPUTATION Right 01/2015  . AMPUTATION Left 07/16/2015   Procedure: AMPUTATION ABOVE KNEE;  Surgeon: Renford Dills, MD;  Location: [facility] ORS;  Service: Vascular;  Laterality: Left;  . AMPUTATION Left 07/23/2015   Procedure:   WOUND CLOSURE;  Surgeon: Renford Dills, MD;  Location: [facility] ORS;  Service: Vascular;  Laterality: Left;  . AMPUTATION Left 11/19/2015   Procedure:  ( REVISION ) AMPUTATION ABOVE KNEE ;  Surgeon: Renford Dills, MD;  Location: [facility] ORS;  Service: Vascular;  Laterality: Left;  . APPENDECTOMY  1969  . BREAST BIOPSY Right 1970   neg  . ECTOPIC PREGNANCY SURGERY Right 1969  . FOREIGN BODY REMOVAL Left 07/28/2016   Procedure: FOREIGN BODY REMOVAL ADULT ( LEG );  Surgeon: Renford Dills, MD;  Location: [facility] ORS;  Service: Vascular;  Laterality: Left;  . PERIPHERAL VASCULAR CATHETERIZATION N/A 07/22/2015   Procedure: Abdominal Aortogram w/Lower Extremity;  Surgeon: Annice Needy, MD;  Location: [facility] INVASIVE CV LAB;  Service: Cardiovascular;  Laterality: N/A;  . PERIPHERAL VASCULAR CATHETERIZATION  07/22/2015   Procedure: Lower Extremity Intervention;  Surgeon: Annice Needy, MD;  Location: [facility] INVASIVE CV LAB;  Service: Cardiovascular;;  . stents left leg    . VISCERAL ANGIOGRAPHY  N/A 03/02/2020   Procedure: VISCERAL ANGIOGRAPHY;  Surgeon: Annice Needy, MD;  Location: [facility] INVASIVE CV LAB;  Service: Cardiovascular;  Laterality: N/A;    Medical History: Past Medical History:  Diagnosis Date  . Hypertension   . Peripheral vascular disease (HCC)   . Pneumonia 07/14/15  . Tobacco abuse   . 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 CAD Mother   . Hypertension Mother   . Stroke Mother   . CAD Father   . Hypertension Father   . Breast cancer Neg Hx     Social History   Socioeconomic History  . Marital status: Married    Spouse name: Not on file  . Number of children: Not on file  . Years of education: Not on file  . Highest education level: Not on file  Occupational History  . Not on file  Tobacco Use  . Smoking status: Light Tobacco Smoker    Packs/day: 0.25    Years: 55.00    Pack years: 13.75    Last attempt to quit: 01/20/2015    Years since quitting: 5.1  . Smokeless tobacco: Never Used  Substance and Sexual Activity  . Alcohol use: No  . Drug use: No  .  Sexual activity: Not on file  Other Topics Concern  . Not on file  Social History Narrative  . Not on file   Social Determinants of Health   Financial Resource Strain:   . Difficulty of Paying Living Expenses:   Food Insecurity:   . Worried About Programme researcher, broadcasting/film/video in the Last Year:   . Barista in the Last Year:   Transportation Needs:   . Freight forwarder (Medical):   Marland Kitchen Lack of Transportation (Non-Medical):   Physical Activity:   . Days of Exercise per Week:   . Minutes of Exercise per Session:   Stress:   . Feeling of Stress :   Social Connections:   . Frequency of Communication with Friends and Family:   . Frequency of Social Gatherings with Friends and Family:   . Attends Religious Services:   . Active Member of Clubs or Organizations:   . Attends Banker Meetings:    Marland Kitchen Marital Status:   Intimate Partner Violence:   . Fear of Current or Ex-Partner:   . Emotionally Abused:   Marland Kitchen Physically Abused:   . Sexually Abused:       Review of Systems  Constitutional: Negative for activity change, chills, diaphoresis, fatigue and unexpected weight change.  HENT: Negative for ear pain, postnasal drip and sinus pressure.   Respiratory: Negative for cough, chest tightness, shortness of breath and wheezing.   Cardiovascular: Negative for chest pain and palpitations.  Gastrointestinal: Negative for abdominal pain, constipation, diarrhea, nausea and vomiting.  Endocrine: Negative for cold intolerance, heat intolerance, polydipsia and polyuria.  Musculoskeletal: Negative for arthralgias, back pain, gait problem and neck pain.       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 Also has lower back pain which is intermittent in nature and associated with exertion. She was started on low dose gabapentin 100mg  twice daily while in the hospital. She was discharged with this, with expectation that PCP would continue and titrate this medication as indicated. The patient states that she is not taking this at all. States that she really has zero pain.   Skin: Negative for color change and rash.  Allergic/Immunologic: Negative for environmental allergies and food allergies.  Neurological: Positive for weakness. Negative for dizziness and headaches.  Hematological: Negative for adenopathy. Does not bruise/bleed easily.  Psychiatric/Behavioral: Negative for agitation, behavioral problems (depression), hallucinations and sleep disturbance. The patient is not nervous/anxious.        Improved since adding low dose alprazolam at bedtime    Today's Vitals   03/05/20 1437  BP: (!) 142/76  Pulse: 91  Resp: 16  Temp: (!) 97.4 F (36.3 C)  SpO2: 97%   There is no height or weight on file to calculate BMI.  Physical Exam Vitals and nursing  note reviewed.  Constitutional:      General: She is not in acute distress.    Appearance: Normal appearance. She is well-developed. She is not diaphoretic.  HENT:     Head: Normocephalic and atraumatic.     Nose: Nose normal.     Mouth/Throat:     Pharynx: No oropharyngeal exudate.  Eyes:     Conjunctiva/sclera: Conjunctivae normal.     Pupils: Pupils are equal, round, and reactive to light.  Neck:     Thyroid: No thyromegaly.     Vascular: Carotid bruit present. No JVD.     Trachea: No tracheal deviation.  Comments: Soft bruit auscultated over the left carotid artery.  Cardiovascular:     Rate and Rhythm: Normal rate and regular rhythm.     Heart sounds: Normal heart sounds. No murmur. No friction rub. No gallop.   Pulmonary:     Effort: Pulmonary effort is normal. No respiratory distress.     Breath sounds: Normal breath sounds. No wheezing or rales.  Chest:     Chest wall: No tenderness.  Abdominal:     Palpations: Abdomen is soft.  Musculoskeletal:        General: Normal range of motion.     Cervical back: Normal range of motion and neck supple.     Comments: She is above the knee amputee, bilaterally. No redness, swelling, or evidence of infection or abnormalities. She currently uses a manual wheelchair.  Lymphadenopathy:     Cervical: No cervical adenopathy.  Skin:    General: Skin is warm and dry.     Capillary Refill: Capillary refill takes 2 to 3 seconds.  Neurological:     General: No focal deficit present.     Mental Status: She is alert and oriented to person, place, and time.     Cranial Nerves: No cranial nerve deficit.  Psychiatric:        Mood and Affect: Mood normal.        Behavior: Behavior normal.        Thought Content: Thought content normal.        Judgment: Judgment normal.    Assessment/Plan: 1. Hospital discharge follow-up Patient recently hospitalized due to allergic reaction to IV contrast dye. She was hospitalized from 02/29/2020  thorough 03/03/2020. Together, we reviewed labs, radiology studies, and progress notes.   2. Allergic reaction to drug, subsequent encounter Patient continues to have generalized edema and itching since allergic reaction. Added prednisone taper. Take as directed for 6 days. Continue to use topical medications as before and as needed for itching and irritation of the skin.  - predniSONE (STERAPRED UNI-PAK 21 TAB) 10 MG (21) TBPK tablet; 6 day taper - take by mouth as directed for 6 days  Dispense: 21 tablet; Refill: 0  3. Atherosclerosis of abdominal aorta [facility](HCC) Reviewed results of CTA of abdomen. There was 1. Age-indeterminate occlusion of the distal aspect of the abdominal aorta caudal to the take-off of the IMA. There is complete occlusion of the bilateral common and external iliac arteries with no significant arterial flow to either lower extremity. 2. High-grade (approximately 70%) narrowing involving the origin of the IMA with associated poststenotic dilatation. 3. Focal approximately 50% luminal narrowing involving the mid aspect of the SMA approximately 3 cm peripheral to its origin. 4. Minimal amount of atherosclerotic plaque involves the origin the celiac artery, not resulting in a hemodynamically significant stenosis. 5. Cardiomegaly. 6.  Aortic Atherosclerosis. She had visceral angiography to resolve vascular occlusion while in the hospital. She will continue to be followed closely per vein and vascular, Dr. Lorretta HarpSchneir.   4. Carotid atherosclerosis, bilateral She had CTA of the neck just prior to her admission. Her scan of the neck did show plaque distributed in both carotid arteries. There was 60% stenosis Tandem plaque at the proximal left ICA causing approximately 50% stenosis. She is now on plavix and continues to take atorvastatin. She will be monitored closely by vein and vascular, Dr. Lorretta HarpSchneir.   5. Peripheral vascular disease (HCC) She is now on plavix and continues to take  atorvastatin. She will be monitored closely by vein  and vascular, Dr. Ronalee Belts.    General Counseling: kristiane morsch understanding of the findings of todays visit and agrees with plan of treatment. I have discussed any further diagnostic evaluation that may be needed or ordered today. We also reviewed her medications today. she has been encouraged to call the office with any questions or concerns that should arise related to todays visit.    Counseling:   This patient was seen by Leretha Pol FNP Collaboration with Dr Lavera Guise as a part of collaborative care agreement    I have reviewed all medical records from hospital follow up including radiology reports and consults from other physicians. Appropriate follow up diagnostics will be scheduled as needed. Patient/ Family understands the plan of treatment. Time spent 45 minutes.   Dr Lavera Guise, MD Internal Medicine

## 2020-03-07 DIAGNOSIS — Z09 Encounter for follow-up examination after completed treatment for conditions other than malignant neoplasm: Secondary | ICD-10-CM | POA: Insufficient documentation

## 2020-03-07 DIAGNOSIS — T7840XA Allergy, unspecified, initial encounter: Secondary | ICD-10-CM | POA: Insufficient documentation

## 2020-03-14 ENCOUNTER — Encounter: Payer: Self-pay | Admitting: Emergency Medicine

## 2020-03-14 ENCOUNTER — Emergency Department
Admission: EM | Admit: 2020-03-14 | Discharge: 2020-03-14 | Disposition: A | Discharge status: Home / Self Care | Payer: Medicare HMO | Attending: Emergency Medicine | Admitting: Emergency Medicine

## 2020-03-14 DIAGNOSIS — Z89612 Acquired absence of left leg above knee: Secondary | ICD-10-CM | POA: Insufficient documentation

## 2020-03-14 DIAGNOSIS — R52 Pain, unspecified: Secondary | ICD-10-CM | POA: Diagnosis not present

## 2020-03-14 DIAGNOSIS — M25572 Pain in left ankle and joints of left foot: Secondary | ICD-10-CM | POA: Diagnosis not present

## 2020-03-14 DIAGNOSIS — Z89611 Acquired absence of right leg above knee: Secondary | ICD-10-CM | POA: Diagnosis not present

## 2020-03-14 DIAGNOSIS — Z720 Tobacco use: Secondary | ICD-10-CM | POA: Diagnosis not present

## 2020-03-14 DIAGNOSIS — Z79899 Other long term (current) drug therapy: Secondary | ICD-10-CM | POA: Diagnosis not present

## 2020-03-14 DIAGNOSIS — M25551 Pain in right hip: Secondary | ICD-10-CM

## 2020-03-14 DIAGNOSIS — I110 Hypertensive heart disease with heart failure: Secondary | ICD-10-CM | POA: Diagnosis not present

## 2020-03-14 DIAGNOSIS — I5022 Chronic systolic (congestive) heart failure: Secondary | ICD-10-CM | POA: Diagnosis not present

## 2020-03-14 DIAGNOSIS — Z7901 Long term (current) use of anticoagulants: Secondary | ICD-10-CM | POA: Insufficient documentation

## 2020-03-14 DIAGNOSIS — I10 Essential (primary) hypertension: Secondary | ICD-10-CM | POA: Diagnosis not present

## 2020-03-14 LAB — BASIC METABOLIC PANEL
Anion gap: 9 (ref 5–15)
BUN: 17 mg/dL (ref 8–23)
CO2: 22 mmol/L (ref 22–32)
Calcium: 9 mg/dL (ref 8.9–10.3)
Chloride: 103 mmol/L (ref 98–111)
Creatinine, Ser: 0.74 mg/dL (ref 0.44–1.00)
GFR calc Af Amer: >60 mL/min (ref >60)
GFR calc non Af Amer: >60 mL/min (ref >60)
Glucose, Bld: 111 mg/dL — ABNORMAL HIGH (ref 70–99)
Potassium: 3.9 mmol/L (ref 3.5–5.1)
Sodium: 134 mmol/L — ABNORMAL LOW (ref 135–145)

## 2020-03-14 LAB — CBC WITH DIFFERENTIAL/PLATELET
Abs Immature Granulocytes: 0.13 10*3/uL — ABNORMAL HIGH (ref 0.00–0.07)
Basophils Absolute: 0.1 10*3/uL (ref 0.0–0.1)
Basophils Relative: 0 %
Eosinophils Absolute: 0.2 10*3/uL (ref 0.0–0.5)
Eosinophils Relative: 1 %
HCT: 34.9 % — ABNORMAL LOW (ref 36.0–46.0)
Hemoglobin: 11.5 g/dL — ABNORMAL LOW (ref 12.0–15.0)
Immature Granulocytes: 1 %
Lymphocytes Relative: 5 %
Lymphs Abs: 0.8 10*3/uL (ref 0.7–4.0)
MCH: 29.8 pg (ref 26.0–34.0)
MCHC: 33 g/dL (ref 30.0–36.0)
MCV: 90.4 fL (ref 80.0–100.0)
Monocytes Absolute: 0.6 10*3/uL (ref 0.1–1.0)
Monocytes Relative: 3 %
Neutro Abs: 15.3 10*3/uL — ABNORMAL HIGH (ref 1.7–7.7)
Neutrophils Relative %: 90 %
Platelets: 343 10*3/uL (ref 150–400)
RBC: 3.86 MIL/uL — ABNORMAL LOW (ref 3.87–5.11)
RDW: 17.7 % — ABNORMAL HIGH (ref 11.5–15.5)
WBC: 17.1 10*3/uL — ABNORMAL HIGH (ref 4.0–10.5)
nRBC: 0 % (ref 0.0–0.2)

## 2020-03-14 LAB — LACTIC ACID, PLASMA
Lactic Acid, Venous: 2.4 mmol/L (ref 0.5–1.9)
Lactic Acid, Venous: 1.4 mmol/L (ref 0.5–1.9)

## 2020-03-14 MED ORDER — FENTANYL CITRATE (PF) 100 MCG/2ML IJ SOLN
50.0000 ug | Freq: Once | INTRAMUSCULAR | Status: AC
  Administered 2020-03-14: 50 ug via INTRAVENOUS
  Filled 2020-03-14: qty 2

## 2020-03-14 MED ORDER — SODIUM CHLORIDE 0.9 % IV BOLUS
1000.0000 mL | Freq: Once | INTRAVENOUS | Status: AC
  Administered 2020-03-14: 1000 mL via INTRAVENOUS

## 2020-03-14 MED ORDER — OXYCODONE HCL 5 MG PO TABS: 5.0000 mg | ORAL_TABLET | Freq: Four times a day (QID) | ORAL | 0 refills | Status: DC | PRN

## 2020-03-14 NOTE — ED Provider Notes (Signed)
[facility] Emergency Department Provider Note   ____________________________________________   I have reviewed the triage vital signs and the nursing notes.   HISTORY  Chief Complaint Hip Pain   History limited by: Not Limited   HPI [name] is a 74 y.o. female who presents to the emergency department today because of concerns for right hip pain that goes down the back of her right thigh to the site of her amputation.  Patient states that she has significant arterial disease history.  She says that the right hip pain does come and go.  It has been severe since this morning.  Patient denies any recent trauma or overuse of her hip joint.  Patient did have a recent hospitalization during which time she had abdominal vascular surgery intervention.  Patient denies any abdominal pain today. Denies any fevers.  Records reviewed. Per medical record review patient has a history of peripheral vascular disease, recent hospitalization.  Past Medical History:  Diagnosis Date  . Hypertension   . Peripheral vascular disease (HCC)   . Pneumonia 07/14/15  . Tobacco abuse   . 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 Allergic drug reaction 03/07/2020  . Hospital discharge follow-up 03/07/2020  . Generalized abdominal pain   . GI bleed 02/29/2020  . Acute GI bleeding 02/29/2020  . Nicotine dependence 02/29/2020  . Carotid atherosclerosis, bilateral 07/06/2019  . Left carotid bruit 09/30/2018  . Chronic pain of both lower extremities 03/11/2018  . Gastroesophageal reflux disease without esophagitis 03/11/2018  . Adjustment insomnia 03/11/2018  . Vitamin D deficiency 03/11/2018  . Carotid stenosis 03/05/2017  . Status post above-knee amputation (HCC) 07/27/2016  . Atherosclerosis of abdominal aorta (HCC) 07/27/2016  . Peripheral vascular disease (HCC) 07/27/2016  . Amputation of lower  extremity above knee with complication (HCC) 11/19/2015  . Encounter for general adult medical examination with abnormal findings 11/15/2015  . Essential hypertension 11/15/2015  . Chronic systolic heart failure (HCC) 08/20/2015  . Tobacco use 08/20/2015  . Arterial occlusion 07/14/2015  . Pressure ulcer 07/14/2015  . Acute respiratory failure (HCC) 07/14/2015  . Pneumonia 02/13/2015    Past Surgical History:  Procedure Laterality Date  . ABOVE KNEE LEG AMPUTATION Right 01/2015  . AMPUTATION Left 07/16/2015   Procedure: AMPUTATION ABOVE KNEE;  Surgeon: Renford Dills, MD;  Location: [facility] ORS;  Service: Vascular;  Laterality: Left;  . AMPUTATION Left 07/23/2015   Procedure:   WOUND CLOSURE;  Surgeon: Renford Dills, MD;  Location: [facility] ORS;  Service: Vascular;  Laterality: Left;  . AMPUTATION Left 11/19/2015   Procedure:  ( REVISION ) AMPUTATION ABOVE KNEE ;  Surgeon: Renford Dills, MD;  Location: [facility] ORS;  Service: Vascular;  Laterality: Left;  . APPENDECTOMY  1969  . BREAST BIOPSY Right 1970   neg  . ECTOPIC PREGNANCY SURGERY Right 1969  . FOREIGN BODY REMOVAL Left 07/28/2016   Procedure: FOREIGN BODY REMOVAL ADULT ( LEG );  Surgeon: Renford Dills, MD;  Location: [facility] ORS;  Service: Vascular;  Laterality: Left;  . PERIPHERAL VASCULAR CATHETERIZATION N/A 07/22/2015   Procedure: Abdominal Aortogram w/Lower Extremity;  Surgeon: Annice Needy, MD;  Location: [facility] INVASIVE CV LAB;  Service: Cardiovascular;  Laterality: N/A;  . PERIPHERAL VASCULAR CATHETERIZATION  07/22/2015   Procedure: Lower Extremity Intervention;  Surgeon: Annice Needy, MD;  Location: [facility] INVASIVE CV LAB;  Service: Cardiovascular;;  .  stents left leg    . VISCERAL ANGIOGRAPHY N/A 03/02/2020   Procedure: VISCERAL ANGIOGRAPHY;  Surgeon: Annice Needy, MD;  Location: [facility] INVASIVE CV LAB;  Service: Cardiovascular;  Laterality: N/A;    Prior to Admission medications   Medication Sig Start Date End Date Taking?  Authorizing Provider  aspirin 81 MG tablet Take 81 mg by mouth every morning.     [provider]  atorvastatin (LIPITOR) 40 MG tablet Take 1 tablet (40 mg total) by mouth at bedtime. 01/20/20   Carlean Jews, NP  cholecalciferol (VITAMIN D3) 25 MCG (1000 UNIT) tablet Take 1,000 Units by mouth daily.    [provider]  clopidogrel (PLAVIX) 75 MG tablet Take 1 tablet (75 mg total) by mouth daily. 03/03/20   Arnetha Courser, MD  famotidine (PEPCID) 20 MG tablet Take 1 tablet (20 mg total) by mouth every morning. 12/15/19   Carlean Jews, NP  ferrous sulfate 325 (65 FE) MG EC tablet Take 1 tablet (325 mg total) by mouth 2 (two) times daily. 03/03/20 03/03/21  Arnetha Courser, MD  gabapentin (NEURONTIN) 100 MG capsule Take 1 capsule (100 mg total) by mouth 3 (three) times daily. 03/03/20   Arnetha Courser, MD  HYDROcodone-acetaminophen (NORCO/VICODIN) 5-325 MG tablet Take 1 tablet by mouth every 6 (six) hours as needed for moderate pain. 12/29/19   Carlean Jews, NP  lidocaine (LIDODERM) 5 % Place 2 patches onto the skin daily. Remove & Discard patch within 12 hours or as directed by MD 03/03/20   Arnetha Courser, MD  lisinopril (ZESTRIL) 20 MG tablet Take 1 tablet (20 mg total) by mouth daily. 12/15/19   Carlean Jews, NP  magnesium hydroxide (MILK OF MAGNESIA) 400 MG/5ML suspension Take 30 mLs by mouth daily as needed for mild constipation.    [provider]  predniSONE (STERAPRED UNI-PAK 21 TAB) 10 MG (21) TBPK tablet 6 day taper - take by mouth as directed for 6 days 03/05/20   Carlean Jews, NP  traMADol (ULTRAM) 50 MG tablet Take 50 mg by mouth as needed.    Geoffery Lyons, PA    Allergies Contrast media [iodinated diagnostic agents] and Morphine and related  Family History  Problem Relation Age of Onset  . CAD Mother   . Hypertension Mother   . Stroke Mother   . CAD Father   . Hypertension Father   . Breast cancer Neg Hx     Social History Social History    Tobacco Use  . Smoking status: Light Tobacco Smoker    Packs/day: 0.25    Years: 55.00    Pack years: 13.75    Last attempt to quit: 01/20/2015    Years since quitting: 5.1  . Smokeless tobacco: Never Used  Substance Use Topics  . Alcohol use: No  . Drug use: No    Review of Systems Constitutional: No fever/chills Eyes: No visual changes. ENT: No sore throat. Cardiovascular: Denies chest pain. Respiratory: Denies shortness of breath. Gastrointestinal: No abdominal pain.  No nausea, no vomiting.  No diarrhea.   Genitourinary: Negative for dysuria. Musculoskeletal:Positive for right hip pain. Skin: Negative for rash. Neurological: Negative for headaches, focal weakness or numbness.  ____________________________________________   PHYSICAL EXAM:  VITAL SIGNS: ED Triage Vitals  Enc Vitals Group     BP 03/14/20 1156 (!) 155/87     Pulse Rate 03/14/20 1156 94     Resp 03/14/20 1156 (!) 22     Temp 03/14/20  1156 97.6 F (36.4 C)     Temp Source 03/14/20 1156 Oral     SpO2 03/14/20 1153 100 %     Weight 03/14/20 1157 29 lb 8.7 oz (13.4 kg)     Height 03/14/20 1157 3\' 6"  (1.067 m)     Head Circumference --      Peak Flow --      Pain Score 03/14/20 1157 10   Constitutional: Alert and oriented.  Eyes: Conjunctivae are normal.  ENT      Head: Normocephalic and atraumatic.      Nose: No congestion/rhinnorhea.      Mouth/Throat: Mucous membranes are moist.      Neck: No stridor. Hematological/Lymphatic/Immunilogical: No cervical lymphadenopathy. Cardiovascular: Normal rate, regular rhythm.  No murmurs, rubs, or gallops. Respiratory: Normal respiratory effort without tachypnea nor retractions. Breath sounds are clear and equal bilaterally. No wheezes/rales/rhonchi. Gastrointestinal: Soft and non tender. No rebound. No guarding.  Genitourinary: Deferred Musculoskeletal: s/p bilateral above the knee amputation Neurologic:  Normal speech and language. No gross focal  neurologic deficits are appreciated.  Skin:  Skin is warm, dry and intact. No rash noted. Psychiatric: Mood and affect are normal. Speech and behavior are normal. Patient exhibits appropriate insight and judgment.  ____________________________________________    LABS (pertinent positives/negatives)  BMP na 134, glu 111 otherwise wnl CBC wbc 17.1, hgb 11.5, plt 343 Lactic acid 2.4 to 1.4   ____________________________________________   EKG  None  ____________________________________________    RADIOLOGY  None  ____________________________________________   PROCEDURES  Procedures  ____________________________________________   INITIAL IMPRESSION / ASSESSMENT AND PLAN / ED COURSE  Pertinent labs & imaging results that were available during my care of the patient were reviewed by me and considered in my medical decision making (see chart for details).   Patient presented to the emergency department today because of concerns for severe right hip pain.  It does appear that she has some chronic right hip pain.  Patient was given fentanyl and did have great improvement in her pain.  Terms of blood work patient's lactic acid was initially elevated.  On repeat this did normalize without any specific intervention.  IV fluids were started after repeat was sent.  Patient did have a leukocytosis although she appears to have frequent leukocytosis.  No fevers or source of infection.  This point I doubt any significant ischemia.  Given good improvement of the patient's pain will plan on discharge home.  Discussed plan with patient.   ____________________________________________   FINAL CLINICAL IMPRESSION(S) / ED DIAGNOSES  Final diagnoses:  Right hip pain     Note: This dictation was prepared with Dragon dictation. Any transcriptional errors that result from this process are unintentional     Nance Pear, MD 03/14/20 1521

## 2020-03-14 NOTE — Discharge Instructions (Signed)
Please seek medical attention for any high fevers, chest pain, shortness of breath, change in behavior, persistent vomiting, bloody stool or any other new or concerning symptoms.  

## 2020-03-14 NOTE — ED Triage Notes (Addendum)
Pt to ED by EMS with c/o of right hip pain. Pt has hx of chronic pain r/t to vascular disease. Pt states hip pain started at 4 am and has been increasing. Per EMS pt took hydrocodone this morning at approx 10 am without relief.

## 2020-03-14 NOTE — ED Notes (Signed)
Pt verbalized understanding of discharge instructions. NAD at this time. 

## 2020-03-19 ENCOUNTER — Telehealth: Payer: Self-pay

## 2020-03-19 NOTE — Telephone Encounter (Signed)
Confirmed and screened for 03-22-20 ov. 

## 2020-03-22 ENCOUNTER — Ambulatory Visit (INDEPENDENT_AMBULATORY_CARE_PROVIDER_SITE_OTHER): Payer: Medicare HMO | Admitting: Internal Medicine

## 2020-03-22 ENCOUNTER — Encounter: Payer: Self-pay | Admitting: Internal Medicine

## 2020-03-22 ENCOUNTER — Other Ambulatory Visit: Payer: Self-pay

## 2020-03-22 DIAGNOSIS — K559 Vascular disorder of intestine, unspecified: Secondary | ICD-10-CM | POA: Diagnosis not present

## 2020-03-22 DIAGNOSIS — Z515 Encounter for palliative care: Secondary | ICD-10-CM

## 2020-03-22 DIAGNOSIS — J449 Chronic obstructive pulmonary disease, unspecified: Secondary | ICD-10-CM | POA: Diagnosis not present

## 2020-03-22 DIAGNOSIS — M79609 Pain in unspecified limb: Secondary | ICD-10-CM

## 2020-03-22 DIAGNOSIS — T8789 Other complications of amputation stump: Secondary | ICD-10-CM

## 2020-03-22 MED ORDER — FENTANYL 12 MCG/HR TD PT72: 1.0000 | MEDICATED_PATCH | TRANSDERMAL | 0 refills | Status: DC

## 2020-03-22 MED ORDER — HYDROCODONE-ACETAMINOPHEN 5-325 MG PO TABS: ORAL_TABLET | ORAL | 0 refills | Status: DC

## 2020-03-22 NOTE — Progress Notes (Signed)
[facility] [place], [place] [address]  Internal MEDICINE  Office Visit Note  Patient Name: [name]  [id number]  [id number]  Date of Service: 03/28/2020     Chief Complaint  Patient presents with  . Hospitalization Follow-up    pinch nerve as per Hospital referral for pain managment   . Hypertension    HPI Pt is here for recent hospital follow up and ED visit She has extensive history of peripheral arterial disease with bilateral above-the-knee amputation after extensive lower extremity revascularization since 2015. She has chronic medical conditions that include one functioning kidney, carotid disease, tobacco use and hyperlipidemia. Recent hospital admission with nausea, diagnosis of mesenteric ischemia is made  CTA with AortoIliac Occlusion is chronic appearing. Mesenteric vessels IMA-70%; SMA 50%; Celiac-minimal also appear chronic.  She underwent mesenteric angiography followed by angioplasty of SMA.  Patient tolerated the procedure well with resolution of symptoms. pt was also started on Plavix along with aspirin per vascular surgery recommendations and will follow up with them as an outpatient. Recently pt went to ED for right hip? Stump /phantom pain and was advised to be seen by pain management. Pt has been on narcotics for a while, uses only as prescribed. She has evidence of COPD On CXR, no PFT's on file. Echo in 2016 EF of 40-45%   Current Medication: Outpatient Encounter Medications as of 03/22/2020  Medication Sig  . aspirin 81 MG tablet Take 81 mg by mouth every morning.   Marland Kitchen atorvastatin (LIPITOR) 40 MG tablet Take 1 tablet (40 mg total) by mouth at bedtime.  . cholecalciferol (VITAMIN D3) 25 MCG (1000 UNIT) tablet Take 1,000 Units by mouth daily.  . clopidogrel (PLAVIX) 75 MG tablet Take 1 tablet (75 mg total) by mouth daily.  . famotidine (PEPCID) 20 MG tablet Take 1 tablet (20 mg total) by mouth every morning.  . ferrous sulfate  325 (65 FE) MG EC tablet Take 1 tablet (325 mg total) by mouth 2 (two) times daily.  Marland Kitchen HYDROcodone-acetaminophen (NORCO/VICODIN) 5-325 MG tablet One tab bid prn for pain of amputation  . lidocaine (LIDODERM) 5 % Place 2 patches onto the skin daily. Remove & Discard patch within 12 hours or as directed by MD  . lisinopril (ZESTRIL) 20 MG tablet Take 1 tablet (20 mg total) by mouth daily.  . magnesium hydroxide (MILK OF MAGNESIA) 400 MG/5ML suspension Take 30 mLs by mouth daily as needed for mild constipation.  . [DISCONTINUED] HYDROcodone-acetaminophen (NORCO/VICODIN) 5-325 MG tablet Take 1 tablet by mouth every 6 (six) hours as needed for moderate pain.  . [DISCONTINUED] oxyCODONE (ROXICODONE) 5 MG immediate release tablet Take 1 tablet (5 mg total) by mouth 4 (four) times daily as needed for severe pain.  . fentaNYL (DURAGESIC) 12 MCG/HR Place 1 patch onto the skin every 3 (three) days.  . [DISCONTINUED] gabapentin (NEURONTIN) 100 MG capsule Take 1 capsule (100 mg total) by mouth 3 (three) times daily. (Patient not taking: Reported on 03/22/2020)  . [DISCONTINUED] predniSONE (STERAPRED UNI-PAK 21 TAB) 10 MG (21) TBPK tablet 6 day taper - take by mouth as directed for 6 days (Patient not taking: Reported on 03/22/2020)  . [DISCONTINUED] traMADol (ULTRAM) 50 MG tablet Take 50 mg by mouth as needed.   No facility-administered encounter medications on file as of 03/22/2020.    Surgical History: Past Surgical History:  Procedure Laterality Date  . ABOVE KNEE LEG AMPUTATION Right 01/2015  . AMPUTATION Left 07/16/2015   Procedure:  AMPUTATION ABOVE KNEE;  Surgeon: Renford Dills, MD;  Location: [facility] ORS;  Service: Vascular;  Laterality: Left;  . AMPUTATION Left 07/23/2015   Procedure:   WOUND CLOSURE;  Surgeon: Renford Dills, MD;  Location: [facility] ORS;  Service: Vascular;  Laterality: Left;  . AMPUTATION Left 11/19/2015   Procedure:  ( REVISION ) AMPUTATION ABOVE KNEE ;  Surgeon: Renford Dills, MD;   Location: [facility] ORS;  Service: Vascular;  Laterality: Left;  . APPENDECTOMY  1969  . BREAST BIOPSY Right 1970   neg  . ECTOPIC PREGNANCY SURGERY Right 1969  . FOREIGN BODY REMOVAL Left 07/28/2016   Procedure: FOREIGN BODY REMOVAL ADULT ( LEG );  Surgeon: Renford Dills, MD;  Location: [facility] ORS;  Service: Vascular;  Laterality: Left;  . PERIPHERAL VASCULAR CATHETERIZATION N/A 07/22/2015   Procedure: Abdominal Aortogram w/Lower Extremity;  Surgeon: Annice Needy, MD;  Location: [facility] IN[facility] CV LAB;  Service: Cardiovascular;  Laterality: N/A;  . PERIPHERAL VASCULAR CATHETERIZATION  07/22/2015   Procedure: Lower Extremity Intervention;  Surgeon: Annice Needy, MD;  Location: [facility];  Service: Cardiovascular;;  . stents left leg    . VISCERAL ANGIOGRAPHY N/A 03/02/2020   Procedure: VISCERAL ANGIOGRAPHY;  Surgeon: Annice Needy, MD;  Location: [facility] IN[facility];  Service: Cardiovascular;  Laterality: N/A;    Medical History: Past Medical History:  Diagnosis Date  . Hypertension   . Peripheral vascular disease (HCC)   . Pneumonia 07/14/15  . Tobacco abuse   . 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 CAD Mother   . Hypertension Mother   . Stroke Mother   . CAD Father   . Hypertension Father   . Breast cancer Neg Hx     Social History   Socioeconomic History  . Marital status: Married    Spouse name: Not on file  . Number of children: Not on file  . Years of education: Not on file  . Highest education level: Not on file  Occupational History  . Not on file  Tobacco Use  . Smoking status: Light Tobacco Smoker    Packs/day: 0.25    Years: 55.00    Pack years: 13.75    Last attempt to quit: 01/20/2015    Years since quitting: 5.1  . Smokeless tobacco: Never Used  Substance and Sexual Activity  . Alcohol use: No  . Drug use: No  . Sexual activity: Not on file  Other Topics Concern   . Not on file  Social History Narrative  . Not on file   Social Determinants of Health   Financial Resource Strain:   . Difficulty of Paying Living Expenses:   Food Insecurity:   . Worried About Programme researcher, broadcasting/film/video in the Last Year:   . Barista in the Last Year:   Transportation Needs:   . Freight forwarder (Medical):   Marland Kitchen Lack of Transportation (Non-Medical):   Physical Activity:   . Days of Exercise per Week:   . Minutes of Exercise per Session:   Stress:   . Feeling of Stress :   Social Connections:   . Frequency of Communication with Friends and Family:   . Frequency of Social Gatherings with Friends and Family:   . Attends Religious Services:   . Active Member of Clubs or Organizations:   . Attends Club  or Organization Meetings:   Marland Kitchen Marital Status:   Intimate Partner Violence:   . Fear of Current or Ex-Partner:   . Emotionally Abused:   Marland Kitchen Physically Abused:   . Sexually Abused:     Review of Systems  Constitutional: Negative for chills, diaphoresis and fatigue.  HENT: Negative for ear pain, postnasal drip and sinus pressure.   Eyes: Negative for photophobia, discharge, redness, itching and visual disturbance.  Respiratory: Negative for cough, shortness of breath and wheezing.   Cardiovascular: Negative for chest pain, palpitations and leg swelling.  Gastrointestinal: Negative for abdominal pain, constipation, diarrhea, nausea and vomiting.  Genitourinary: Negative for dysuria and flank pain.  Musculoskeletal: Negative for arthralgias, back pain, gait problem and neck pain.       Right stump pain// back pain as well   Skin: Negative for color change.  Allergic/Immunologic: Negative for environmental allergies and food allergies.  Neurological: Negative for dizziness and headaches.  Hematological: Does not bruise/bleed easily.  Psychiatric/Behavioral: Negative for agitation, behavioral problems (depression) and hallucinations.    Vital Signs: BP (!)  145/74   Pulse 92   Temp 97.9 F (36.6 C)   Resp 16   Ht 3\' 10"  (1.168 m)   Wt 103 lb (46.7 kg)   SpO2 97%   BMI 34.22 kg/m    Physical Exam Constitutional:      Appearance: Normal appearance.     Comments: Pt is in w/c Bilateral above knee amputation   HENT:     Head: Normocephalic and atraumatic.  Eyes:     Extraocular Movements: Extraocular movements intact.     Pupils: Pupils are equal, round, and reactive to light.  Cardiovascular:     Rate and Rhythm: Normal rate and regular rhythm.     Pulses: Normal pulses.     Heart sounds: Normal heart sounds. No murmur heard.   Pulmonary:     Effort: Pulmonary effort is normal.     Breath sounds: Rhonchi present.     Comments: Coarse bs especially on the left Abdominal:     General: Abdomen is flat.     Tenderness: There is right CVA tenderness.  Musculoskeletal:        General: Tenderness present.     Cervical back: Normal range of motion and neck supple.  Skin:    General: Skin is warm and dry.  Neurological:     General: No focal deficit present.     Mental Status: She is alert and oriented to person, place, and time.  Psychiatric:        Mood and Affect: Mood normal.   Assessment/Plan: 1. Stump pain (HCC) - Pt has severe and widespread atherosclerotic disease, she continues to smoke, continue all meds, might need to be back on Gabapentin  - HYDROcodone-acetaminophen (NORCO/VICODIN) 5-325 MG tablet; One tab bid prn for pain of amputation  Dispense: 60 tablet; Refill: 0 - Ambulatory referral to Pain Clinic. Will start her on low dose Duragesic patch  2. Palliative care patient - She might get benefit from this service, will look into this for outpatient care   3. Mesenteric ischemia (HCC) - per vascular, pt is on Plavix, all records reviewed, including her angiogram    4. Chronic obstructive pulmonary disease, unspecified COPD type (HCC) - Will need PFT. And low dose CT chest for smoking   General Counseling:  ameri cahoon understanding of the findings of todays visit and agrees with plan of treatment. I have discussed any further diagnostic evaluation  that may be needed or ordered today. We also reviewed her medications today. she has been encouraged to call the office with any questions or concerns that should arise related to todays visit.  Counseling. Reviewed risks and possible side effects associated with taking opiates, benzodiazepines and other CNS depressants. Combination of these could cause dizziness and drowsiness. Advised patient not to drive or operate machinery when taking these medications, as patient's and other's life can be at risk and will have consequences. Patient verbalized understanding in this matter. Dependence and abuse for these drugs will be monitored closely. A Controlled substance policy and procedure is on file which allows [facility] to order a urine drug screen test at any visit. Patient understands and agrees with the plan  Meds ordered this encounter  Medications  . HYDROcodone-acetaminophen (NORCO/VICODIN) 5-325 MG tablet    Sig: One tab bid prn for pain of amputation    Dispense:  60 tablet    Refill:  0    Prescription is not for post op pain and is not new prescription. This is chronic/ongoing pain for which she takes this medication only as needed.  . fentaNYL (DURAGESIC) 12 MCG/HR    Sig: Place 1 patch onto the skin every 3 (three) days.    Dispense:  10 patch    Refill:  0   Orders Placed This Encounter  Procedures  . Ambulatory referral to Pain Clinic    I have reviewed all medical records from hospital follow up including radiology reports and consults from other physicians. Appropriate follow up diagnostics will be scheduled as needed. Patient/ Family understands the plan of treatment. Time spent 53 minutes. Pt required high level, complex and comprehensive evaluation and decision making   Dr Lyndon Code, MD Internal Medicine

## 2020-03-31 ENCOUNTER — Ambulatory Visit: Payer: Medicare HMO | Admitting: Gastroenterology

## 2020-04-05 ENCOUNTER — Other Ambulatory Visit: Payer: Self-pay

## 2020-04-05 MED ORDER — CLOPIDOGREL BISULFATE 75 MG PO TABS: 75.0000 mg | ORAL_TABLET | Freq: Every day | ORAL | 3 refills | Status: DC

## 2020-04-06 ENCOUNTER — Other Ambulatory Visit (INDEPENDENT_AMBULATORY_CARE_PROVIDER_SITE_OTHER): Payer: Self-pay | Admitting: Vascular Surgery

## 2020-04-06 DIAGNOSIS — Z9862 Peripheral vascular angioplasty status: Secondary | ICD-10-CM

## 2020-04-06 DIAGNOSIS — K551 Chronic vascular disorders of intestine: Secondary | ICD-10-CM

## 2020-04-07 ENCOUNTER — Other Ambulatory Visit: Payer: Self-pay

## 2020-04-07 ENCOUNTER — Ambulatory Visit (INDEPENDENT_AMBULATORY_CARE_PROVIDER_SITE_OTHER): Payer: Medicare HMO

## 2020-04-07 ENCOUNTER — Ambulatory Visit (INDEPENDENT_AMBULATORY_CARE_PROVIDER_SITE_OTHER): Payer: Medicare HMO | Admitting: Nurse Practitioner

## 2020-04-07 DIAGNOSIS — K551 Chronic vascular disorders of intestine: Secondary | ICD-10-CM

## 2020-04-07 DIAGNOSIS — Z9862 Peripheral vascular angioplasty status: Secondary | ICD-10-CM

## 2020-04-12 ENCOUNTER — Other Ambulatory Visit (INDEPENDENT_AMBULATORY_CARE_PROVIDER_SITE_OTHER): Payer: Self-pay | Admitting: Nurse Practitioner

## 2020-04-12 ENCOUNTER — Encounter (INDEPENDENT_AMBULATORY_CARE_PROVIDER_SITE_OTHER): Payer: Self-pay | Admitting: Nurse Practitioner

## 2020-04-12 NOTE — Progress Notes (Signed)
Pt not npo, pt not seen today d/t no studies

## 2020-04-15 ENCOUNTER — Ambulatory Visit: Payer: Medicare HMO | Admitting: Student in an Organized Health Care Education/Training Program

## 2020-05-04 ENCOUNTER — Other Ambulatory Visit: Payer: Self-pay | Admitting: Nurse Practitioner

## 2020-05-04 ENCOUNTER — Telehealth: Payer: Self-pay

## 2020-05-04 DIAGNOSIS — T8789 Other complications of amputation stump: Secondary | ICD-10-CM

## 2020-05-04 DIAGNOSIS — M79609 Pain in unspecified limb: Secondary | ICD-10-CM

## 2020-05-04 MED ORDER — HYDROCODONE-ACETAMINOPHEN 5-325 MG PO TABS: ORAL_TABLET | ORAL | 0 refills | Status: DC

## 2020-05-04 NOTE — Telephone Encounter (Signed)
Renewed her hydrocodone for thirty days. Sent to her pharmacy.

## 2020-05-04 NOTE — Telephone Encounter (Signed)
Pt advised we send pres  

## 2020-05-05 ENCOUNTER — Other Ambulatory Visit: Payer: Self-pay

## 2020-05-05 ENCOUNTER — Encounter (INDEPENDENT_AMBULATORY_CARE_PROVIDER_SITE_OTHER): Payer: Self-pay | Admitting: Nurse Practitioner

## 2020-05-05 ENCOUNTER — Ambulatory Visit (INDEPENDENT_AMBULATORY_CARE_PROVIDER_SITE_OTHER): Payer: Medicare HMO

## 2020-05-05 ENCOUNTER — Ambulatory Visit (INDEPENDENT_AMBULATORY_CARE_PROVIDER_SITE_OTHER): Payer: Medicare HMO | Admitting: Nurse Practitioner

## 2020-05-05 VITALS — BP 130/81 | HR 92 | Resp 19 | Ht <= 58 in | Wt 102.0 lb

## 2020-05-05 DIAGNOSIS — T8789 Other complications of amputation stump: Secondary | ICD-10-CM | POA: Diagnosis not present

## 2020-05-05 DIAGNOSIS — M79604 Pain in right leg: Secondary | ICD-10-CM

## 2020-05-05 DIAGNOSIS — K551 Chronic vascular disorders of intestine: Secondary | ICD-10-CM

## 2020-05-05 DIAGNOSIS — Z9862 Peripheral vascular angioplasty status: Secondary | ICD-10-CM | POA: Diagnosis not present

## 2020-05-05 DIAGNOSIS — I6523 Occlusion and stenosis of bilateral carotid arteries: Secondary | ICD-10-CM

## 2020-05-05 DIAGNOSIS — I7090 Unspecified atherosclerosis: Secondary | ICD-10-CM | POA: Diagnosis not present

## 2020-05-05 MED ORDER — GABAPENTIN 300 MG PO CAPS: 300.0000 mg | ORAL_CAPSULE | Freq: Two times a day (BID) | ORAL | 3 refills | Status: DC

## 2020-05-07 ENCOUNTER — Encounter (INDEPENDENT_AMBULATORY_CARE_PROVIDER_SITE_OTHER): Payer: Self-pay | Admitting: Nurse Practitioner

## 2020-05-07 NOTE — Progress Notes (Signed)
Subjective:    Patient ID: [name], female    DOB: 1946-04-01, 74 y.o.   MRN: [id number] Chief Complaint  Patient presents with  . Follow-up    Patient returns to the office today following angioplasty of her SMA on 03/02/2020.  The patient was in the emergency room due to abdominal pain with nausea and vomiting.  The patient notes that today her stomach feels 100% better.  She denies any food phobia.  She denies any nausea, vomiting or diarrhea.  She denies any abdominal pain.  The patient's biggest complaint is that she is having burning in her right stump.  She notes that this is not consistent with phantom limb pain.  The patient notes that she began to have this pain after discharge from the hospital.  When the patient is sitting it is much worse however laying down improves it.  Patient also notes that if she is stretching forward and also improves the pain.  But sitting is nearly unbearable for her.  The patient does note that she has had a history of lower back issues.  Today noninvasive studies showed a normal celiac artery, splenic artery hepatic artery findings.  The SMA is patent post angioplasty with elevated velocities at the mid to proximal SMA however that may be due to some compensatory flow due to a known aorta occlusion.   Review of Systems  Musculoskeletal: Positive for arthralgias, back pain, myalgias and neck pain.  All other systems reviewed and are negative.      Objective:   Physical Exam Vitals reviewed.  HENT:     Head: Normocephalic.  Neck:     Vascular: Carotid bruit (Bilateral) present.  Cardiovascular:     Rate and Rhythm: Normal rate.     Pulses: Normal pulses.     Heart sounds: Normal heart sounds.  Pulmonary:     Effort: Pulmonary effort is normal.     Breath sounds: Normal breath sounds.  Abdominal:     General: Abdomen is flat.  Musculoskeletal:     Right Lower Extremity: Right leg is amputated above knee.     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  Neurological:     Mental Status: She is alert and oriented to person, place, and time.  Psychiatric:        Mood and Affect: Mood normal.        Behavior: Behavior normal.        Thought Content: Thought content normal.        Judgment: Judgment normal.     BP 130/81 (BP Location: Left Arm)   Pulse 92   Resp 19   Ht 3\' 10"  (1.168 m)   Wt 102 lb (46.3 kg)   BMI 33.89 kg/m   Past Medical History:  Diagnosis Date  . Hypertension   . Peripheral vascular disease (HCC)   . Pneumonia 07/14/15  . Tobacco abuse   . Unilateral small kidney    a. one functional kidney    Social History   Socioeconomic History  . Marital status: Married    Spouse name: Not on file  . Number of children: Not on file  . Years of education: Not on file  . Highest education level: Not on file  Occupational History  . Not on file  Tobacco Use  . Smoking status: Light Tobacco Smoker    Packs/day: 0.25    Years: 55.00    Pack years: 13.75  Last attempt to quit: 01/20/2015    Years since quitting: 5.2  . Smokeless tobacco: Never Used  Substance and Sexual Activity  . Alcohol use: No  . Drug use: No  . Sexual activity: Not on file  Other Topics Concern  . Not on file  Social History Narrative  . Not on file   Social Determinants of Health   Financial Resource Strain:   . Difficulty of Paying Living Expenses:   Food Insecurity:   . Worried About Programme researcher, broadcasting/film/video in the Last Year:   . Barista in the Last Year:   Transportation Needs:   . Freight forwarder (Medical):   Marland Kitchen Lack of Transportation (Non-Medical):   Physical Activity:   . Days of Exercise per Week:   . Minutes of Exercise per Session:   Stress:   . Feeling of Stress :   Social Connections:   . Frequency of Communication with Friends and Family:   . Frequency of Social Gatherings with Friends and Family:   . Attends Religious Services:   . Active Member of Clubs or  Organizations:   . Attends Banker Meetings:   Marland Kitchen Marital Status:   Intimate Partner Violence:   . Fear of Current or Ex-Partner:   . Emotionally Abused:   Marland Kitchen Physically Abused:   . Sexually Abused:     Past Surgical History:  Procedure Laterality Date  . ABOVE KNEE LEG AMPUTATION Right 01/2015  . AMPUTATION Left 07/16/2015   Procedure: AMPUTATION ABOVE KNEE;  Surgeon: Renford Dills, MD;  Location: [facility] OR[facility];  Service: Vascular;  Laterality: Left;  . AMPUTATION Left 07/23/2015   Procedure:   WOUND CLOSURE;  Surgeon: Renford Dills, MD;  Location: [facility];  Service: Vascular;  Laterality: Left;  . AMPUTATION Left 11/19/2015   Procedure:  ( REVISION ) AMPUTATION ABOVE KNEE ;  Surgeon: Renford Dills, MD;  Location: [facility];  Service: Vascular;  Laterality: Left;  . APPENDECTOMY  1969  . BREAST BIOPSY Right 1970   neg  . ECTOPIC PREGNANCY SURGERY Right 1969  . FOREIGN BODY REMOVAL Left 07/28/2016   Procedure: FOREIGN BODY REMOVAL ADULT ( LEG );  Surgeon: Renford Dills, MD;  Location: [facility];  Service: Vascular;  Laterality: Left;  . PERIPHERAL VASCULAR CATHETERIZATION N/A 07/22/2015   Procedure: Abdominal Aortogram w/Lower Extremity;  Surgeon: Annice Needy, MD;  Location: [facility];  Service: Cardiovascular;  Laterality: N/A;  . PERIPHERAL VASCULAR CATHETERIZATION  07/22/2015   Procedure: Lower Extremity Intervention;  Surgeon: Annice Needy, MD;  Location: [facility];  Service: Cardiovascular;;  . stents left leg    . VISCERAL ANGIOGRAPHY N/A 03/02/2020   Procedure: VISCERAL ANGIOGRAPHY;  Surgeon: Annice Needy, MD;  Location: [facility];  Service: Cardiovascular;  Laterality: N/A;    Family History  Problem Relation Age of Onset  . CAD Mother   . Hypertension Mother   . Stroke Mother   . CAD Father   . Hypertension Father   . Breast cancer Neg Hx     Allergies  Allergen Reactions  . Contrast Media [Iodinated Diagnostic  Agents] Rash  . Morphine And Related Diarrhea and Nausea And Vomiting       Assessment & Plan:   1. Pain of amputation stump of right lower extremity (HCC) based upon the patient's description of her pain, as well as the fact that certain positional changes seem to  help her stump pain, it is likely related to her back.  The patient does have known lumbar disease, and since the pain began after the patient's hospital discharge, hospitalization may have exacerbated issues with her back.  We will refer the patient to neurosurgery for further work-up and evaluation. - gabapentin (NEURONTIN) 300 MG capsule; Take 1 capsule (300 mg total) by mouth 2 (two) times daily.  Dispense: 60 capsule; Refill: 3 - Ambulatory referral to Neurosurgery  2. Chronic mesenteric ischemia (HCC) Recommend:  The patient is status post successful angiogram with intervention of the mesenteric vessels.  SMA angioplasty was performed.  The patient reports that the abdominal pain is improved and the post prandial symptoms are essentially gone.   The patient denies lifestyle limiting changes at this point in time.  No further invasive studies, angiography or surgery at this time The patient should continue walking and begin a more formal exercise program.  The patient should continue antiplatelet therapy and aggressive treatment of the lipid abnormalities  Smoking cessation was again discussed  Patient should undergo noninvasive studies as ordered. The patient will follow up with me after the studies.   Patient will follow up in 3 months with noninvasive studies.  - VAS [place] MESENTERIC; Future  3. 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  4. Bilateral carotid artery stenosis The patient had outside imaging done in April that revealed greater than 70% ICA stenosis bilaterally.  However recent CT scan however is at about 50 to 60%.   When the patient returns for follow-up visit we will obtain a carotid artery duplex.  This will essentially be 6 months which is the timeframe that we typically follow-up carotid patients.  If the studies are consistent with a greater than 70% stenosis we will discuss possible intervention steps with the patient. - VAS [place] CAROTID; Future   Current Outpatient Medications on File Prior to Visit  Medication Sig Dispense Refill  . aspirin 81 MG tablet Take 81 mg by mouth every morning.     Marland Kitchen atorvastatin (LIPITOR) 40 MG tablet Take 1 tablet (40 mg total) by mouth at bedtime. 30 tablet 5  . cholecalciferol (VITAMIN D3) 25 MCG (1000 UNIT) tablet Take 1,000 Units by mouth daily.    . clopidogrel (PLAVIX) 75 MG tablet Take 1 tablet (75 mg total) by mouth daily. 30 tablet 3  . famotidine (PEPCID) 20 MG tablet Take 1 tablet (20 mg total) by mouth every morning. 30 tablet 5  . ferrous sulfate 325 (65 FE) MG EC tablet Take 1 tablet (325 mg total) by mouth 2 (two) times daily. 60 tablet 3  . HYDROcodone-acetaminophen (NORCO/VICODIN) 5-325 MG tablet One tab bid prn for pain of amputation 60 tablet 0  . lisinopril (ZESTRIL) 20 MG tablet Take 1 tablet (20 mg total) by mouth daily. 30 tablet 5  . magnesium hydroxide (MILK OF MAGNESIA) 400 MG/5ML suspension Take 30 mLs by mouth daily as needed for mild constipation.    . fentaNYL (DURAGESIC) 12 MCG/HR Place 1 patch onto the skin every 3 (three) days. (Patient not taking: Reported on 05/05/2020) 10 patch 0  . lidocaine (LIDODERM) 5 % Place 2 patches onto the skin daily. Remove & Discard patch within 12 hours or as directed by MD (Patient not taking: Reported on 05/05/2020) 30 patch 0   No current facility-administered medications on file prior to visit.    There are no Patient  Instructions on file for this visit. No follow-ups on file.   Kris Hartmann, NP

## 2020-05-11 ENCOUNTER — Other Ambulatory Visit: Payer: Self-pay

## 2020-05-11 DIAGNOSIS — I10 Essential (primary) hypertension: Secondary | ICD-10-CM

## 2020-05-11 MED ORDER — LISINOPRIL 20 MG PO TABS: 20.0000 mg | ORAL_TABLET | Freq: Every day | ORAL | 5 refills | Status: DC

## 2020-05-18 DIAGNOSIS — M419 Scoliosis, unspecified: Secondary | ICD-10-CM | POA: Diagnosis not present

## 2020-05-18 DIAGNOSIS — K5641 Fecal impaction: Secondary | ICD-10-CM | POA: Diagnosis not present

## 2020-05-18 DIAGNOSIS — Z95828 Presence of other vascular implants and grafts: Secondary | ICD-10-CM | POA: Diagnosis not present

## 2020-05-18 DIAGNOSIS — G8929 Other chronic pain: Secondary | ICD-10-CM | POA: Diagnosis not present

## 2020-05-18 DIAGNOSIS — M5441 Lumbago with sciatica, right side: Secondary | ICD-10-CM | POA: Diagnosis not present

## 2020-05-18 DIAGNOSIS — M5416 Radiculopathy, lumbar region: Secondary | ICD-10-CM | POA: Diagnosis not present

## 2020-05-18 DIAGNOSIS — M79604 Pain in right leg: Secondary | ICD-10-CM | POA: Diagnosis not present

## 2020-05-18 DIAGNOSIS — Z9889 Other specified postprocedural states: Secondary | ICD-10-CM | POA: Diagnosis not present

## 2020-05-18 DIAGNOSIS — I700 Atherosclerosis of aorta: Secondary | ICD-10-CM | POA: Diagnosis not present

## 2020-05-18 DIAGNOSIS — M8588 Other specified disorders of bone density and structure, other site: Secondary | ICD-10-CM | POA: Diagnosis not present

## 2020-05-18 DIAGNOSIS — M4726 Other spondylosis with radiculopathy, lumbar region: Secondary | ICD-10-CM | POA: Diagnosis not present

## 2020-05-18 DIAGNOSIS — Z89511 Acquired absence of right leg below knee: Secondary | ICD-10-CM | POA: Diagnosis not present

## 2020-05-18 DIAGNOSIS — I739 Peripheral vascular disease, unspecified: Secondary | ICD-10-CM | POA: Diagnosis not present

## 2020-05-19 ENCOUNTER — Telehealth (INDEPENDENT_AMBULATORY_CARE_PROVIDER_SITE_OTHER): Payer: Self-pay | Admitting: Vascular Surgery

## 2020-05-19 NOTE — Telephone Encounter (Signed)
Called stating that she was seen yesterday at Dr. Ophelia Charter office (Neurosurgery) and they advised her to come in to be seen. Patient has a bruise or ulcer on the back of her right stump. Patient says she noticed it about 10 days ago. She would like to come in to be seen. Patient was last seen with Mesenteric studies (FB) 05-05-20. FB advised me to bring her in with no studies. This note is for documentation purposes only.

## 2020-05-20 ENCOUNTER — Other Ambulatory Visit: Payer: Self-pay | Admitting: Nurse Practitioner

## 2020-05-20 DIAGNOSIS — M5441 Lumbago with sciatica, right side: Secondary | ICD-10-CM

## 2020-05-20 DIAGNOSIS — G8929 Other chronic pain: Secondary | ICD-10-CM

## 2020-05-21 ENCOUNTER — Other Ambulatory Visit: Payer: Self-pay

## 2020-05-21 DIAGNOSIS — K219 Gastro-esophageal reflux disease without esophagitis: Secondary | ICD-10-CM

## 2020-05-21 DIAGNOSIS — I700 Atherosclerosis of aorta: Secondary | ICD-10-CM

## 2020-05-21 MED ORDER — ATORVASTATIN CALCIUM 40 MG PO TABS: 40.0000 mg | ORAL_TABLET | Freq: Every day | ORAL | 5 refills | Status: DC

## 2020-05-21 MED ORDER — CLOPIDOGREL BISULFATE 75 MG PO TABS: 75.0000 mg | ORAL_TABLET | Freq: Every day | ORAL | 3 refills | Status: DC

## 2020-05-21 MED ORDER — FAMOTIDINE 20 MG PO TABS
20.0000 mg | ORAL_TABLET | ORAL | 5 refills | Status: DC
Start: 2020-05-21 — End: 2020-08-21

## 2020-06-07 ENCOUNTER — Telehealth: Payer: Self-pay

## 2020-06-08 ENCOUNTER — Ambulatory Visit
Admission: RE | Admit: 2020-06-08 | Discharge: 2020-06-08 | Disposition: A | Discharge status: Home / Self Care | Payer: Medicare HMO | Source: Ambulatory Visit | Attending: Nurse Practitioner | Admitting: Nurse Practitioner

## 2020-06-08 ENCOUNTER — Other Ambulatory Visit: Payer: Self-pay | Admitting: Nurse Practitioner

## 2020-06-08 ENCOUNTER — Other Ambulatory Visit: Payer: Self-pay

## 2020-06-08 DIAGNOSIS — T8789 Other complications of amputation stump: Secondary | ICD-10-CM

## 2020-06-08 DIAGNOSIS — M5441 Lumbago with sciatica, right side: Secondary | ICD-10-CM | POA: Diagnosis not present

## 2020-06-08 DIAGNOSIS — S32009A Unspecified fracture of unspecified lumbar vertebra, initial encounter for closed fracture: Secondary | ICD-10-CM | POA: Diagnosis not present

## 2020-06-08 DIAGNOSIS — M5136 Other intervertebral disc degeneration, lumbar region: Secondary | ICD-10-CM | POA: Diagnosis not present

## 2020-06-08 DIAGNOSIS — G8929 Other chronic pain: Secondary | ICD-10-CM | POA: Diagnosis not present

## 2020-06-08 DIAGNOSIS — N1330 Unspecified hydronephrosis: Secondary | ICD-10-CM | POA: Diagnosis not present

## 2020-06-08 DIAGNOSIS — M79609 Pain in unspecified limb: Secondary | ICD-10-CM

## 2020-06-08 DIAGNOSIS — N261 Atrophy of kidney (terminal): Secondary | ICD-10-CM | POA: Diagnosis not present

## 2020-06-08 MED ORDER — HYDROCODONE-ACETAMINOPHEN 5-325 MG PO TABS
ORAL_TABLET | ORAL | 0 refills | Status: DC
Start: 2020-06-08 — End: 2020-07-05

## 2020-06-08 NOTE — Telephone Encounter (Cosign Needed)
Yes if you would she said on hydrocodone.  She said the gabapentin doesn't do very well.  dbs

## 2020-06-08 NOTE — Telephone Encounter (Signed)
Does she need new prescription for hydrocodone/APAP?

## 2020-06-18 DIAGNOSIS — M25551 Pain in right hip: Secondary | ICD-10-CM | POA: Diagnosis not present

## 2020-06-18 DIAGNOSIS — M1611 Unilateral primary osteoarthritis, right hip: Secondary | ICD-10-CM | POA: Diagnosis not present

## 2020-06-22 ENCOUNTER — Other Ambulatory Visit: Payer: Self-pay

## 2020-06-22 ENCOUNTER — Encounter: Payer: Self-pay | Admitting: Emergency Medicine

## 2020-06-22 ENCOUNTER — Emergency Department: Payer: Medicare HMO

## 2020-06-22 ENCOUNTER — Telehealth (INDEPENDENT_AMBULATORY_CARE_PROVIDER_SITE_OTHER): Payer: Self-pay

## 2020-06-22 ENCOUNTER — Encounter: Payer: Self-pay | Admitting: Nurse Practitioner

## 2020-06-22 ENCOUNTER — Ambulatory Visit (INDEPENDENT_AMBULATORY_CARE_PROVIDER_SITE_OTHER): Payer: Medicare HMO | Admitting: Nurse Practitioner

## 2020-06-22 VITALS — Resp 16 | Ht <= 58 in

## 2020-06-22 DIAGNOSIS — M79609 Pain in unspecified limb: Secondary | ICD-10-CM

## 2020-06-22 DIAGNOSIS — T8741 Infection of amputation stump, right upper extremity: Secondary | ICD-10-CM | POA: Insufficient documentation

## 2020-06-22 DIAGNOSIS — I110 Hypertensive heart disease with heart failure: Secondary | ICD-10-CM | POA: Insufficient documentation

## 2020-06-22 DIAGNOSIS — T8789 Other complications of amputation stump: Secondary | ICD-10-CM | POA: Insufficient documentation

## 2020-06-22 DIAGNOSIS — Z79899 Other long term (current) drug therapy: Secondary | ICD-10-CM | POA: Diagnosis not present

## 2020-06-22 DIAGNOSIS — I5022 Chronic systolic (congestive) heart failure: Secondary | ICD-10-CM | POA: Insufficient documentation

## 2020-06-22 DIAGNOSIS — R69 Illness, unspecified: Secondary | ICD-10-CM | POA: Diagnosis not present

## 2020-06-22 DIAGNOSIS — F17210 Nicotine dependence, cigarettes, uncomplicated: Secondary | ICD-10-CM | POA: Diagnosis not present

## 2020-06-22 DIAGNOSIS — L03115 Cellulitis of right lower limb: Secondary | ICD-10-CM | POA: Insufficient documentation

## 2020-06-22 DIAGNOSIS — M79604 Pain in right leg: Secondary | ICD-10-CM | POA: Diagnosis present

## 2020-06-22 DIAGNOSIS — Z7982 Long term (current) use of aspirin: Secondary | ICD-10-CM | POA: Diagnosis not present

## 2020-06-22 DIAGNOSIS — R0902 Hypoxemia: Secondary | ICD-10-CM | POA: Diagnosis not present

## 2020-06-22 DIAGNOSIS — S71101A Unspecified open wound, right thigh, initial encounter: Secondary | ICD-10-CM

## 2020-06-22 DIAGNOSIS — M79651 Pain in right thigh: Secondary | ICD-10-CM | POA: Diagnosis not present

## 2020-06-22 DIAGNOSIS — R52 Pain, unspecified: Secondary | ICD-10-CM | POA: Diagnosis not present

## 2020-06-22 DIAGNOSIS — I959 Hypotension, unspecified: Secondary | ICD-10-CM | POA: Diagnosis not present

## 2020-06-22 DIAGNOSIS — M79605 Pain in left leg: Secondary | ICD-10-CM | POA: Diagnosis not present

## 2020-06-22 DIAGNOSIS — R5381 Other malaise: Secondary | ICD-10-CM | POA: Diagnosis not present

## 2020-06-22 DIAGNOSIS — S81801A Unspecified open wound, right lower leg, initial encounter: Secondary | ICD-10-CM

## 2020-06-22 LAB — CBC WITH DIFFERENTIAL/PLATELET
Abs Immature Granulocytes: 0.13 10*3/uL — ABNORMAL HIGH (ref 0.00–0.07)
Basophils Absolute: 0 10*3/uL (ref 0.0–0.1)
Basophils Relative: 0 %
Eosinophils Absolute: 0 10*3/uL (ref 0.0–0.5)
Eosinophils Relative: 0 %
HCT: 26.6 % — ABNORMAL LOW (ref 36.0–46.0)
Hemoglobin: 8 g/dL — ABNORMAL LOW (ref 12.0–15.0)
Immature Granulocytes: 1 %
Lymphocytes Relative: 4 %
Lymphs Abs: 0.5 10*3/uL — ABNORMAL LOW (ref 0.7–4.0)
MCH: 26.1 pg (ref 26.0–34.0)
MCHC: 30.1 g/dL (ref 30.0–36.0)
MCV: 86.6 fL (ref 80.0–100.0)
Monocytes Absolute: 0.5 10*3/uL (ref 0.1–1.0)
Monocytes Relative: 3 %
Neutro Abs: 13.1 10*3/uL — ABNORMAL HIGH (ref 1.7–7.7)
Neutrophils Relative %: 92 %
Platelets: 382 10*3/uL (ref 150–400)
RBC: 3.07 MIL/uL — ABNORMAL LOW (ref 3.87–5.11)
RDW: 16.5 % — ABNORMAL HIGH (ref 11.5–15.5)
WBC: 14.3 10*3/uL — ABNORMAL HIGH (ref 4.0–10.5)
nRBC: 0 % (ref 0.0–0.2)

## 2020-06-22 LAB — COMPREHENSIVE METABOLIC PANEL
ALT: 9 U/L (ref 0–44)
AST: 14 U/L — ABNORMAL LOW (ref 15–41)
Albumin: 3.2 g/dL — ABNORMAL LOW (ref 3.5–5.0)
Alkaline Phosphatase: 94 U/L (ref 38–126)
Anion gap: 10 (ref 5–15)
BUN: 26 mg/dL — ABNORMAL HIGH (ref 8–23)
CO2: 19 mmol/L — ABNORMAL LOW (ref 22–32)
Calcium: 8.8 mg/dL — ABNORMAL LOW (ref 8.9–10.3)
Chloride: 109 mmol/L (ref 98–111)
Creatinine, Ser: 0.6 mg/dL (ref 0.44–1.00)
GFR calc Af Amer: >60 mL/min (ref >60)
GFR calc non Af Amer: >60 mL/min (ref >60)
Glucose, Bld: 96 mg/dL (ref 70–99)
Potassium: 3.9 mmol/L (ref 3.5–5.1)
Sodium: 138 mmol/L (ref 135–145)
Total Bilirubin: 0.5 mg/dL (ref 0.3–1.2)
Total Protein: 6.6 g/dL (ref 6.5–8.1)

## 2020-06-22 LAB — LACTIC ACID, PLASMA: Lactic Acid, Venous: 0.8 mmol/L (ref 0.5–1.9)

## 2020-06-22 LAB — SEDIMENTATION RATE: Sed Rate: 23 mm/hr (ref 0–30)

## 2020-06-22 MED ORDER — OXYCODONE-ACETAMINOPHEN 5-300 MG PO TABS: 1.0000 | ORAL_TABLET | ORAL | 0 refills | Status: DC | PRN

## 2020-06-22 MED ORDER — OXYCODONE-ACETAMINOPHEN 5-325 MG PO TABS: 1.0000 | ORAL_TABLET | ORAL | 0 refills | Status: DC | PRN

## 2020-06-22 MED ORDER — SULFAMETHOXAZOLE-TRIMETHOPRIM 800-160 MG PO TABS: 1.0000 | ORAL_TABLET | Freq: Two times a day (BID) | ORAL | 0 refills | Status: DC

## 2020-06-22 NOTE — Telephone Encounter (Signed)
Right stump has Black on the end and red underneath, no fever with a lot of pain. Last seen on 05/05/20. Please advise.

## 2020-06-22 NOTE — ED Triage Notes (Signed)
Pt to triage via recliner, c/o pain to "whole right side"; pt bilat amputee; seen her PCP today and rx pain meds and bactrim for infection of leg; eschar noted to rt stump; oxycodone taken at 330pm

## 2020-06-22 NOTE — Telephone Encounter (Signed)
Pts daughter left a message on the nurses line saying that her mom's Right stump has a black patch and she needed to be seen when I returned the call to the pts daughter she said that the pt has already been worked into the scheduled to be seen here in our office on Thursday.

## 2020-06-22 NOTE — Telephone Encounter (Signed)
Let's see if we can get her in on Thursday with GS

## 2020-06-22 NOTE — Addendum Note (Signed)
Addended by: Vincent Gros on: 06/22/2020 02:08 PM   Modules accepted: Orders

## 2020-06-22 NOTE — Telephone Encounter (Signed)
See note below, per Sheppard Plumber NP. Thank you

## 2020-06-22 NOTE — Progress Notes (Signed)
[facility] [address] [place], [place] [address]  Internal MEDICINE  Office Visit Note  Patient Name: [name]  [id number]  [id number]  Date of Service: 06/22/2020  Chief Complaint  Patient presents with  . Acute Visit  . Leg Pain    infection on leg      The patient presents for acute visit. The patient has wound with cellulitis present at base of stump of right side. She states that this started about 2 weeks ago with what appeared as bruise like lesion posterior aspect of the right stump. With time, pain became worse. Wound opened up at the base of the stump. Is currently oozing serosanguinous fluid. Patient sates that previously prescribed pain medication is not touching the pain associated with this. She feels like there is a dagger sticking in through the base of the stump and into the right leg. She states that pain is much worse with any movement.  She and her daughter initially called paramedics this morning.  Was coerced to stay home and contact her primary care provider as wait time in the hospital is very long and if admission is suggested, it may take days before she gets a room. The patient was unable to come into the office from her car as pain in the le was so severe.   Pt is here for a sick visit.     Current Medication:  Outpatient Encounter Medications as of 06/22/2020  Medication Sig  . aspirin 81 MG tablet Take 81 mg by mouth every morning.   Marland Kitchen atorvastatin (LIPITOR) 40 MG tablet Take 1 tablet (40 mg total) by mouth at bedtime.  . cholecalciferol (VITAMIN D3) 25 MCG (1000 UNIT) tablet Take 1,000 Units by mouth daily.  . clopidogrel (PLAVIX) 75 MG tablet Take 1 tablet (75 mg total) by mouth daily.  . famotidine (PEPCID) 20 MG tablet Take 1 tablet (20 mg total) by mouth every morning.  . ferrous sulfate 325 (65 FE) MG EC tablet Take 1 tablet (325 mg total) by mouth 2 (two) times daily.  Marland Kitchen gabapentin (NEURONTIN) 300 MG capsule Take 1  capsule (300 mg total) by mouth 2 (two) times daily.  Marland Kitchen HYDROcodone-acetaminophen (NORCO/VICODIN) 5-325 MG tablet One tab bid prn for pain of amputation  . lisinopril (ZESTRIL) 20 MG tablet Take 1 tablet (20 mg total) by mouth daily.  . magnesium hydroxide (MILK OF MAGNESIA) 400 MG/5ML suspension Take 30 mLs by mouth daily as needed for mild constipation.  Marland Kitchen oxycodone-acetaminophen (LYNOX) 5-300 MG tablet Take 1 tablet by mouth every 4 (four) hours as needed for up to 5 days for pain.  Marland Kitchen sulfamethoxazole-trimethoprim (BACTRIM DS) 800-160 MG tablet Take 1 tablet by mouth 2 (two) times daily.  . [DISCONTINUED] fentaNYL (DURAGESIC) 12 MCG/HR Place 1 patch onto the skin every 3 (three) days. (Patient not taking: Reported on 05/05/2020)  . [DISCONTINUED] lidocaine (LIDODERM) 5 % Place 2 patches onto the skin daily. Remove & Discard patch within 12 hours or as directed by MD (Patient not taking: Reported on 05/05/2020)   No facility-administered encounter medications on file as of 06/22/2020.      Medical History: Past Medical History:  Diagnosis Date  . Hypertension   . Peripheral vascular disease (HCC)   . Pneumonia 07/14/15  . Tobacco abuse   . Unilateral small kidney    a. one functional kidney     Today's Vitals   06/22/20 1153  Resp: 16  Height: 3\' 10"  (1.168 m)  Body mass index is 33.89 kg/m.  Review of Systems  Constitutional: Positive for activity change and fatigue. Negative for chills and unexpected weight change.       Patient having hard time even moving as pain in her right leg is so severe.  HENT: Negative for congestion, postnasal drip, rhinorrhea, sneezing and sore throat.   Respiratory: Negative for cough, chest tightness and shortness of breath.   Cardiovascular: Negative for chest pain and palpitations.  Gastrointestinal: Negative for abdominal pain, constipation, diarrhea, nausea and vomiting.  Musculoskeletal: Positive for arthralgias and myalgias. Negative for  back pain, joint swelling and neck pain.       Severe pain in right stump and upper leg.   Skin: Positive for wound. Negative for rash.       Base of the stump of right leg.   Neurological: Negative for dizziness, tremors, numbness and headaches.  Hematological: Negative for adenopathy. Does not bruise/bleed easily.  Psychiatric/Behavioral: Negative for behavioral problems (Depression), dysphoric mood, sleep disturbance and suicidal ideas. The patient is nervous/anxious.     Physical Exam Vitals and nursing note reviewed.  Constitutional:      General: She is in acute distress.     Appearance: Normal appearance. She is well-developed. She is not diaphoretic.  HENT:     Head: Normocephalic and atraumatic.     Mouth/Throat:     Pharynx: No oropharyngeal exudate.  Eyes:     Pupils: Pupils are equal, round, and reactive to light.  Neck:     Thyroid: No thyromegaly.     Vascular: No JVD.     Trachea: No tracheal deviation.  Cardiovascular:     Rate and Rhythm: Normal rate and regular rhythm.     Heart sounds: Normal heart sounds. No murmur heard.  No friction rub. No gallop.   Pulmonary:     Effort: Pulmonary effort is normal. No respiratory distress.     Breath sounds: Normal breath sounds. No wheezing or rales.  Chest:     Chest wall: No tenderness.  Abdominal:     Palpations: Abdomen is soft.  Musculoskeletal:        General: Normal range of motion.     Cervical back: Normal range of motion and neck supple.     Comments: She has severe pain in right stump. There is grimacing present with nearly every movement.   Lymphadenopathy:     Cervical: No cervical adenopathy.  Skin:    General: Skin is warm and dry.     Comments: There is large, open wound on base of stump of right leg. Wound is open and currently draining moderate amount of serosanguinous fluid. There is considerable cellulitis surrounding the wound. It iw red, warm, and painful to touch, even lightly.    Neurological:     Mental Status: She is alert and oriented to person, place, and time.     Cranial Nerves: No cranial nerve deficit.  Psychiatric:        Attention and Perception: Attention and perception normal.        Mood and Affect: Affect normal. Mood is anxious.        Speech: Speech normal.        Behavior: Behavior normal. Behavior is cooperative.        Thought Content: Thought content normal.        Cognition and Memory: Cognition and memory normal.        Judgment: Judgment normal.   Assessment/Plan: 1. Open wound  multiple sites one lower limb and thigh with complication, right, initial encounter Start bactrim DS twice daliy for next 10 days. Advised her and her daughter to gently clean the wound twice daily and apply clean and dry dressing. An urgent referral to home health/woundcare management was made today for further evaluation and treatment.  - sulfamethoxazole-trimethoprim (BACTRIM DS) 800-160 MG tablet; Take 1 tablet by mouth 2 (two) times daily.  Dispense: 20 tablet; Refill: 0 - Ambulatory referral to Home Health  2. Cellulitis of right lower extremity Start bactrim DS twice daliy for next 10 days. Advised her and her daughter to gently clean the wound twice daily and apply clean and dry dressing. An urgent referral to home health/woundcare management was made today for further evaluation and treatment.  - Ambulatory referral to Home Health  3. Stump pain (HCC) Short term prescription for oxycodone/APAP 5/300mg  sent to her pharmacy. Advised her to hold previously prescribed hydrocodone/APAP 5/325mg  tablets while taking oxycodone/APAP. She voiced understanding.  - oxycodone-acetaminophen (LYNOX) 5-300 MG tablet; Take 1 tablet by mouth every 4 (four) hours as needed for up to 5 days for pain.  Dispense: 30 tablet; Refill: 0  General Counseling: fia hebert understanding of the findings of todays visit and agrees with plan of treatment. I have discussed any  further diagnostic evaluation that may be needed or ordered today. We also reviewed her medications today. she has been encouraged to call the office with any questions or concerns that should arise related to todays visit.    Counseling:  This patient was seen by Vincent Gros FNP Collaboration with Dr Lyndon Code as a part of collaborative care agreement  Orders Placed This Encounter  Procedures  . Ambulatory referral to Home Health    Meds ordered this encounter  Medications  . sulfamethoxazole-trimethoprim (BACTRIM DS) 800-160 MG tablet    Sig: Take 1 tablet by mouth 2 (two) times daily.    Dispense:  20 tablet    Refill:  0    Order Specific Question:   Supervising Provider    Answer:   Lyndon Code [[id number]]  . oxycodone-acetaminophen (LYNOX) 5-300 MG tablet    Sig: Take 1 tablet by mouth every 4 (four) hours as needed for up to 5 days for pain.    Dispense:  30 tablet    Refill:  0    Patient to hold any other pain medication while taking this. This is short term prescription only. Please fill with generic alternative preferred by patient's insurance.    Order Specific Question:   Supervising Provider    Answer:   Lyndon Code [[id number]]    Time spent: 45 Minutes

## 2020-06-23 ENCOUNTER — Telehealth (INDEPENDENT_AMBULATORY_CARE_PROVIDER_SITE_OTHER): Payer: Self-pay

## 2020-06-23 ENCOUNTER — Emergency Department
Admission: EM | Admit: 2020-06-23 | Discharge: 2020-06-23 | Disposition: A | Discharge status: Home / Self Care | Payer: Medicare HMO | Attending: Emergency Medicine | Admitting: Emergency Medicine

## 2020-06-23 ENCOUNTER — Telehealth: Payer: Self-pay

## 2020-06-23 DIAGNOSIS — T8740 Infection of amputation stump, unspecified extremity: Secondary | ICD-10-CM

## 2020-06-23 DIAGNOSIS — M79604 Pain in right leg: Secondary | ICD-10-CM

## 2020-06-23 DIAGNOSIS — L03115 Cellulitis of right lower limb: Secondary | ICD-10-CM

## 2020-06-23 DIAGNOSIS — M869 Osteomyelitis, unspecified: Secondary | ICD-10-CM

## 2020-06-23 MED ORDER — OXYCODONE-ACETAMINOPHEN 5-325 MG PO TABS
1.0000 | ORAL_TABLET | Freq: Once | ORAL | Status: AC
  Administered 2020-06-23: 1 via ORAL
  Filled 2020-06-23: qty 1

## 2020-06-23 NOTE — Telephone Encounter (Signed)
Patient's daughter Dewayne Hatch left a message regarding the patient being at the ED for over 12 hours and not being seen. Dewayne Hatch stated she called and spoke with the Vascular surgeon on call but nothing changed regarding her being there so long. Dewayne Hatch also stated that other people were being seen and her mother was sitting there with her leg rotting off and draining. I explained that our doctors do not have any control over how the ED operates or any authority to make a patient be seen. Ann understood, I did give her patient experience number for her to call with her complaints. Patient's other daughter French Ana came to our office demanding that someone look at the picture of her mother's leg and stating she needs IV antibiotics. I explained to her as I did her sister how the ED works and that we have no way of giving her mother IV antibiotics in our office and her being at the ED is the place she needs to be.

## 2020-06-23 NOTE — ED Notes (Signed)
Pt d/c home with family. Condition stable. Pt will follow up with scheduled appoint with Surgeon tomorrow. Verbalized understanding of instructions given

## 2020-06-23 NOTE — ED Provider Notes (Signed)
[facility] Emergency Department Provider Note   ____________________________________________   First MD Initiated Contact with Patient 06/23/20 1358     (approximate)  I have reviewed the triage vital signs and the nursing notes.   HISTORY  Chief Complaint Leg Pain    HPI [name] is a 74 y.o. female with a stated past medical history of hypertension, peripheral vascular disease, tobacco abuse and bilateral lower extremity amputee who presents for entire right side pain.  Patient was recently seen at her primary care physician's office and placed on antibiotic regimen for a stump infection of the right above-the-knee amputation.  Patient states that she is continuing to have pain in her right lower extremity for which she was prescribed oxycodone previously.  Patient describes aching, 10/10, pain that radiates up her entire right side and has no exacerbating or relieving factors.         Past Medical History:  Diagnosis Date   Hypertension    Peripheral vascular disease (HCC)    Pneumonia 07/14/15   Tobacco abuse    Unilateral small kidney    a. one functional kidney    Patient Active Problem List   Diagnosis Date Noted   Open wound multiple sites one lower limb and thigh with complication, right, initial encounter 06/22/2020   Stump pain (HCC) 06/22/2020   Cellulitis of right lower extremity 06/22/2020   Palliative care patient 03/22/2020   Allergic drug reaction 03/07/2020   Hospital discharge follow-up 03/07/2020   Generalized abdominal pain    GI bleed 02/29/2020   Acute GI bleeding 02/29/2020   Nicotine dependence 02/29/2020   Carotid atherosclerosis, bilateral 07/06/2019   Left carotid bruit 09/30/2018   Chronic pain of both lower extremities 03/11/2018   Gastroesophageal reflux disease without esophagitis 03/11/2018   Adjustment insomnia 03/11/2018   Vitamin D deficiency 03/11/2018   Carotid stenosis  03/05/2017   Status post above-knee amputation (HCC) 07/27/2016   Atherosclerosis of abdominal aorta (HCC) 07/27/2016   Peripheral vascular disease (HCC) 07/27/2016   Amputation of lower extremity above knee with complication (HCC) 11/19/2015   Encounter for general adult medical examination with abnormal findings 11/15/2015   Essential hypertension 11/15/2015   Chronic systolic heart failure (HCC) 08/20/2015   Tobacco use 08/20/2015   Arterial occlusion 07/14/2015   Pressure ulcer 07/14/2015   Acute respiratory failure (HCC) 07/14/2015   Pneumonia 02/13/2015    Past Surgical History:  Procedure Laterality Date   ABOVE KNEE LEG AMPUTATION Right 01/2015   AMPUTATION Left 07/16/2015   Procedure: AMPUTATION ABOVE KNEE;  Surgeon: Renford DillsGregory G Schnier, MD;  Location: [facility] ORS;  Service: Vascular;  Laterality: Left;   AMPUTATION Left 07/23/2015   Procedure:   WOUND CLOSURE;  Surgeon: Renford DillsGregory G Schnier, MD;  Location: [facility] ORS;  Service: Vascular;  Laterality: Left;   AMPUTATION Left 11/19/2015   Procedure:  ( REVISION ) AMPUTATION ABOVE KNEE ;  Surgeon: Renford DillsGregory G Schnier, MD;  Location: [facility] ORS;  Service: Vascular;  Laterality: Left;   APPENDECTOMY  1969   BREAST BIOPSY Right 1970   neg   ECTOPIC PREGNANCY SURGERY Right 1969   FOREIGN BODY REMOVAL Left 07/28/2016   Procedure: FOREIGN BODY REMOVAL ADULT ( LEG );  Surgeon: Renford DillsGregory G Schnier, MD;  Location: [facility] ORS;  Service: Vascular;  Laterality: Left;   PERIPHERAL VASCULAR CATHETERIZATION N/A 07/22/2015   Procedure: Abdominal Aortogram w/Lower Extremity;  Surgeon: Annice NeedyJason S Dew, MD;  Location: [facility];  Service: Cardiovascular;  Laterality: N/A;   PERIPHERAL VASCULAR CATHETERIZATION  07/22/2015   Procedure: Lower Extremity Intervention;  Surgeon: Annice Needy, MD;  Location: [facility];  Service: Cardiovascular;;   stents left leg     VISCERAL ANGIOGRAPHY N/A 03/02/2020   Procedure: VISCERAL  ANGIOGRAPHY;  Surgeon: Annice Needy, MD;  Location: [facility];  Service: Cardiovascular;  Laterality: N/A;    Prior to Admission medications   Medication Sig Start Date End Date Taking? Authorizing Provider  aspirin 81 MG tablet Take 81 mg by mouth every morning.    Yes [provider]  atorvastatin (LIPITOR) 40 MG tablet Take 1 tablet (40 mg total) by mouth at bedtime. 05/21/20  Yes Boscia, Kathlynn Grate, NP  cholecalciferol (VITAMIN D3) 25 MCG (1000 UNIT) tablet Take 1,000 Units by mouth daily.   Yes [provider]  clopidogrel (PLAVIX) 75 MG tablet Take 1 tablet (75 mg total) by mouth daily. 05/21/20  Yes Boscia, Kathlynn Grate, NP  famotidine (PEPCID) 20 MG tablet Take 1 tablet (20 mg total) by mouth every morning. 05/21/20  Yes Boscia, Kathlynn Grate, NP  ferrous sulfate 325 (65 FE) MG EC tablet Take 1 tablet (325 mg total) by mouth 2 (two) times daily. 03/03/20 03/03/21 Yes Arnetha Courser, MD  gabapentin (NEURONTIN) 300 MG capsule Take 1 capsule (300 mg total) by mouth 2 (two) times daily. 05/05/20  Yes Georgiana Spinner, NP  HYDROcodone-acetaminophen (NORCO/VICODIN) 5-325 MG tablet One tab bid prn for pain of amputation 06/08/20  Yes Boscia, Heather E, NP  lisinopril (ZESTRIL) 20 MG tablet Take 1 tablet (20 mg total) by mouth daily. 05/11/20  Yes Boscia, Kathlynn Grate, NP  magnesium hydroxide (MILK OF MAGNESIA) 400 MG/5ML suspension Take 30 mLs by mouth daily as needed for mild constipation.   Yes [provider]  oxyCODONE-acetaminophen (PERCOCET/ROXICET) 5-325 MG tablet Take 1 tablet by mouth every 4 (four) hours as needed for up to 5 days for severe pain. 06/22/20 06/27/20 Yes Boscia, Kathlynn Grate, NP  sulfamethoxazole-trimethoprim (BACTRIM DS) 800-160 MG tablet Take 1 tablet by mouth 2 (two) times daily. 06/22/20  Yes Boscia, Kathlynn Grate, NP    Allergies Contrast media [iodinated diagnostic agents] and Morphine and related  Family History  Problem Relation Age of Onset   CAD Mother      Hypertension Mother    Stroke Mother    CAD Father    Hypertension Father    Breast cancer Neg Hx     Social History Social History   Tobacco Use   Smoking status: Light Tobacco Smoker    Packs/day: 0.25    Years: 55.00    Pack years: 13.75    Last attempt to quit: 01/20/2015    Years since quitting: 5.4   Smokeless tobacco: Never Used  Vaping Use   Vaping Use: Never used  Substance Use Topics   Alcohol use: No   Drug use: No    Review of Systems  Constitutional: No fever/chills Eyes: No visual changes. ENT: No sore throat. Cardiovascular: Denies chest pain. Respiratory: Denies shortness of breath. Gastrointestinal: No abdominal pain.  No nausea, no vomiting.  No diarrhea. Genitourinary: Negative for dysuria. Musculoskeletal: Positive for acute arthralgias Skin: Negative for rash. Neurological: Negative for headaches, weakness/numbness/paresthesias in any extremity Psychiatric: Negative for suicidal ideation/homicidal ideation   ____________________________________________   PHYSICAL EXAM:  VITAL SIGNS: ED Triage Vitals  Enc Vitals Group     BP 06/22/20 2024 (!) 117/52     Pulse  Rate 06/22/20 2024 93     Resp 06/22/20 2024 18     Temp 06/22/20 2024 98 F (36.7 C)     Temp Source 06/22/20 2024 Oral     SpO2 06/22/20 2024 97 %     Weight 06/22/20 2027 102 lb (46.3 kg)     Height --      Head Circumference --      Peak Flow --      Pain Score 06/22/20 2026 7     Pain Loc --      Pain Edu? --      Excl. in GC? --     Constitutional: Alert and oriented. Well appearing and in no acute distress. Eyes: Conjunctivae are normal. PERRL. EOMI. Head: Atraumatic. Nose: No congestion/rhinnorhea. Mouth/Throat: Mucous membranes are moist.  Oropharynx non-erythematous. Neck: No stridor.   Cardiovascular: Normal rate, regular rhythm. Grossly normal heart sounds.  Good peripheral circulation. Respiratory: Normal respiratory effort.  No retractions. Lungs  CTAB. Gastrointestinal: Soft and nontender. No distention. No abdominal bruits. No CVA tenderness. Musculoskeletal: Bilateral above-the-knee amputations with eschar noted over right stump without surrounding erythema or any purulent drainage Neurologic:  Normal speech and language. No gross focal neurologic deficits are appreciated. No gait instability. Skin:  Skin is warm, dry and intact. No rash noted. Psychiatric: Mood and affect are normal. Speech and behavior are normal.  ____________________________________________   LABS (all labs ordered are listed, but only abnormal results are displayed)  Labs Reviewed  CBC WITH DIFFERENTIAL/PLATELET - Abnormal; Notable for the following components:      Result Value   WBC 14.3 (*)    RBC 3.07 (*)    Hemoglobin 8.0 (*)    HCT 26.6 (*)    RDW 16.5 (*)    Neutro Abs 13.1 (*)    Lymphs Abs 0.5 (*)    Abs Immature Granulocytes 0.13 (*)    All other components within normal limits  COMPREHENSIVE METABOLIC PANEL - Abnormal; Notable for the following components:   CO2 19 (*)    BUN 26 (*)    Calcium 8.8 (*)    Albumin 3.2 (*)    AST 14 (*)    All other components within normal limits  LACTIC ACID, PLASMA  SEDIMENTATION RATE    RADIOLOGY  ED MD interpretation: 2 view x-ray of the right femur shows no fracture, subluxation, or dislocation as well as no radiologic changes of osteomyelitis  Official radiology report(s): DG FEMUR, MIN 2 VIEWS RIGHT  Result Date: 06/22/2020 CLINICAL DATA:  Right side pain EXAM: RIGHT FEMUR 2 VIEWS COMPARISON:  07/28/2016 FINDINGS: Changes of prior amputation at the level of the mid right femur. No acute bony abnormality. Specifically, no fracture, subluxation, or dislocation. No radiographic changes of osteomyelitis. IMPRESSION: No acute bony abnormality. Electronically Signed   By: Charlett Nose M.D.   On: 06/22/2020 23:34    ____________________________________________   PROCEDURES  Procedure(s)  performed (including Critical Care):  Procedures   ____________________________________________   INITIAL IMPRESSION / ASSESSMENT AND PLAN / ED COURSE        Patient presents for right-sided pain in the setting of a right stump infection. Presentation most consistent with simple cellulitis. Given History, Exam, and Workup I have low suspicion for Necrotizing Fasciitis, Abscess, Osteomyelitis, DVT or other emergent problem as a cause for this presentation.  Rx: Patient prescribed Bactrim and oxycodone from her primary care physician earlier today  Disposition: Discharge. No evidence of serious bacterial illness. Nontoxic appearing,  VSS. Low risk for treatment failure based on history. Strict return precautions discussed with patient with full understanding. Advised patient to follow up promptly with primary care provider within next 48 hours.     ____________________________________________   FINAL CLINICAL IMPRESSION(S) / ED DIAGNOSES  Final diagnoses:  Right leg pain  Amputation stump infection (HCC)  Cellulitis of right lower extremity     ED Discharge Orders    None       Note:  This document was prepared using Dragon voice recognition software and may include unintentional dictation errors.   Merwyn Katos, MD 06/23/20 [phone]

## 2020-06-23 NOTE — Telephone Encounter (Signed)
Called [facility] for referral they going to run through insurance  And call us back

## 2020-06-23 NOTE — ED Notes (Signed)
D/w Dr. Dolores Frame, ok to order dose of Percocet at this time, no additional orders at this time for antibiotics, pt lactic acid is normal per Dr. Marvis Repress not start atbx in lobby.

## 2020-06-23 NOTE — ED Notes (Signed)
Daughter requesting to speak with MD. MD notified.

## 2020-06-24 ENCOUNTER — Other Ambulatory Visit: Payer: Self-pay

## 2020-06-24 ENCOUNTER — Ambulatory Visit (INDEPENDENT_AMBULATORY_CARE_PROVIDER_SITE_OTHER): Payer: Medicare HMO | Admitting: Vascular Surgery

## 2020-06-24 ENCOUNTER — Encounter (INDEPENDENT_AMBULATORY_CARE_PROVIDER_SITE_OTHER): Payer: Self-pay | Admitting: Vascular Surgery

## 2020-06-24 VITALS — BP 128/77 | HR 96 | Ht <= 58 in | Wt 102.0 lb

## 2020-06-24 DIAGNOSIS — L03115 Cellulitis of right lower limb: Secondary | ICD-10-CM

## 2020-06-24 DIAGNOSIS — I10 Essential (primary) hypertension: Secondary | ICD-10-CM

## 2020-06-24 DIAGNOSIS — I700 Atherosclerosis of aorta: Secondary | ICD-10-CM

## 2020-06-24 DIAGNOSIS — T8789 Other complications of amputation stump: Secondary | ICD-10-CM

## 2020-06-24 DIAGNOSIS — T879 Unspecified complications of amputation stump: Secondary | ICD-10-CM | POA: Diagnosis not present

## 2020-06-24 DIAGNOSIS — I5022 Chronic systolic (congestive) heart failure: Secondary | ICD-10-CM

## 2020-06-24 DIAGNOSIS — M79609 Pain in unspecified limb: Secondary | ICD-10-CM | POA: Diagnosis not present

## 2020-06-24 MED ORDER — OXYCODONE-ACETAMINOPHEN 5-325 MG PO TABS: 1.0000 | ORAL_TABLET | Freq: Four times a day (QID) | ORAL | 0 refills | Status: AC | PRN

## 2020-06-24 MED ORDER — PREDNISONE 50 MG PO TABS: 50.0000 mg | ORAL_TABLET | ORAL | 0 refills | Status: DC

## 2020-06-24 MED ORDER — METHYLPREDNISOLONE 4 MG PO TBPK: ORAL_TABLET | ORAL | 1 refills | Status: DC

## 2020-06-24 MED ORDER — DIPHENHYDRAMINE HCL 25 MG PO CAPS: 25.0000 mg | ORAL_CAPSULE | ORAL | 0 refills | Status: DC

## 2020-06-24 NOTE — Progress Notes (Signed)
MRN : [id number]  [name] is a 74 y.o. (1945/12/24) female who presents with chief complaint of  Chief Complaint  Patient presents with  . Follow-up    add on per patient call  .  History of Present Illness:   Chief complaint: Pain and new sore of right AKA stump  Location: posterior right AKA stump Character/quality of the symptom:  Acute with chronic sharp pain Severity:  10/10 Duration:  constant Timing/onset:  Began about one month and has been increasing in size Aggravating/context:  Pressure or touching Relieving/modifying:  none  Current Meds  Medication Sig  . aspirin 81 MG EC tablet Take by mouth.  Marland Kitchen. aspirin 81 MG tablet Take 81 mg by mouth every morning.   Marland Kitchen. atorvastatin (LIPITOR) 40 MG tablet Take 1 tablet (40 mg total) by mouth at bedtime.  Marland Kitchen. atorvastatin (LIPITOR) 40 MG tablet Take by mouth.  . cholecalciferol (VITAMIN D3) 25 MCG (1000 UNIT) tablet Take 1,000 Units by mouth daily.  . clopidogrel (PLAVIX) 75 MG tablet Take 1 tablet (75 mg total) by mouth daily.  . famotidine (PEPCID) 20 MG tablet Take 1 tablet (20 mg total) by mouth every morning.  . famotidine (PEPCID) 20 MG tablet Take by mouth.  . ferrous sulfate 325 (65 FE) MG EC tablet Take 1 tablet (325 mg total) by mouth 2 (two) times daily.  Marland Kitchen. gabapentin (NEURONTIN) 300 MG capsule Take 1 capsule (300 mg total) by mouth 2 (two) times daily.  Marland Kitchen. HYDROcodone-acetaminophen (NORCO/VICODIN) 5-325 MG tablet One tab bid prn for pain of amputation  . lisinopril (ZESTRIL) 20 MG tablet Take 1 tablet (20 mg total) by mouth daily.  . magnesium hydroxide (MILK OF MAGNESIA) 400 MG/5ML suspension Take 30 mLs by mouth daily as needed for mild constipation.  Marland Kitchen. oxyCODONE-acetaminophen (PERCOCET/ROXICET) 5-325 MG tablet Take 1 tablet by mouth every 4 (four) hours as needed for up to 5 days for severe pain.  Marland Kitchen. sulfamethoxazole-trimethoprim (BACTRIM DS) 800-160 MG tablet Take 1 tablet by mouth 2 (two) times daily.     Past Medical History:  Diagnosis Date  . Hypertension   . Peripheral vascular disease (HCC)   . Pneumonia 07/14/15  . Tobacco abuse   . Unilateral small kidney    a. one functional kidney    Past Surgical History:  Procedure Laterality Date  . ABOVE KNEE LEG AMPUTATION Right 01/2015  . AMPUTATION Left 07/16/2015   Procedure: AMPUTATION ABOVE KNEE;  Surgeon: Renford DillsGregory G Indica Marcott, MD;  Location: [facility] OR[facility];  Service: Vascular;  Laterality: Left;  . AMPUTATION Left 07/23/2015   Procedure:   WOUND CLOSURE;  Surgeon: Renford DillsGregory G Delron Comer, MD;  Location: [facility];  Service: Vascular;  Laterality: Left;  . AMPUTATION Left 11/19/2015   Procedure:  ( REVISION ) AMPUTATION ABOVE KNEE ;  Surgeon: Renford DillsGregory G Cabella Kimm, MD;  Location: [facility];  Service: Vascular;  Laterality: Left;  . APPENDECTOMY  1969  . BREAST BIOPSY Right 1970   neg  . ECTOPIC PREGNANCY SURGERY Right 1969  . FOREIGN BODY REMOVAL Left 07/28/2016   Procedure: FOREIGN BODY REMOVAL ADULT ( LEG );  Surgeon: Renford DillsGregory G Shirlie Enck, MD;  Location: [facility];  Service: Vascular;  Laterality: Left;  . PERIPHERAL VASCULAR CATHETERIZATION N/A 07/22/2015   Procedure: Abdominal Aortogram w/Lower Extremity;  Surgeon: Annice NeedyJason S Dew, MD;  Location: [facility];  Service: Cardiovascular;  Laterality: N/A;  . PERIPHERAL VASCULAR CATHETERIZATION  07/22/2015   Procedure: Lower Extremity Intervention;  Surgeon:  Annice Needy, MD;  Location: [facility];  Service: Cardiovascular;;  . stents left leg    . VISCERAL ANGIOGRAPHY N/A 03/02/2020   Procedure: VISCERAL ANGIOGRAPHY;  Surgeon: Annice Needy, MD;  Location: [facility];  Service: Cardiovascular;  Laterality: N/A;    Social History Social History   Tobacco Use  . Smoking status: Light Tobacco Smoker    Packs/day: 0.25    Years: 55.00    Pack years: 13.75    Last attempt to quit: 01/20/2015    Years since quitting: 5.4  . Smokeless tobacco: Never Used  Vaping Use  . Vaping Use:  Never used  Substance Use Topics  . Alcohol use: No  . Drug use: No    Family History Family History  Problem Relation Age of Onset  . CAD Mother   . Hypertension Mother   . Stroke Mother   . CAD Father   . Hypertension Father   . Breast cancer Neg Hx     Allergies  Allergen Reactions  . Contrast Media [Iodinated Diagnostic Agents] Rash  . Morphine And Related Diarrhea and Nausea And Vomiting     REVIEW OF SYSTEMS (Negative unless checked)  Constitutional: [] Weight loss  [] Fever  [] Chills Cardiac: [] Chest pain   [] Chest pressure   [] Palpitations   [] Shortness of breath when laying flat   [] Shortness of breath with exertion. Vascular:  [] Pain in legs with walking   [x] Pain in legs at rest  [] History of DVT   [] Phlebitis   [] Swelling in legs   [] Varicose veins   [x] Non-healing ulcers Pulmonary:   [] Uses home oxygen   [] Productive cough   [] Hemoptysis   [] Wheeze  [x] COPD   [] Asthma Neurologic:  [] Dizziness   [] Seizures   [] History of stroke   [] History of TIA  [] Aphasia   [] Vissual changes   [] Weakness or numbness in arm   [] Weakness or numbness in leg Musculoskeletal:   [] Joint swelling   [] Joint pain   [] Low back pain Hematologic:  [] Easy bruising  [] Easy bleeding   [] Hypercoagulable state   [] Anemic Gastrointestinal:  [] Diarrhea   [] Vomiting  [] Gastroesophageal reflux/heartburn   [] Difficulty swallowing. Genitourinary:  [] Chronic kidney disease   [] Difficult urination  [] Frequent urination   [] Blood in urine Skin:  [] Rashes   [x] Ulcers  Psychological:  [] History of anxiety   []  History of major depression.  Physical Examination  Vitals:   06/24/20 1319  BP: 128/77  Pulse: 96  Weight: 102 lb (46.3 kg)  Height: 3\' 10"  (1.168 m)   Body mass index is 33.89 kg/m. Gen: WD/WN, NAD Head: /AT, No temporalis wasting.  Ear/Nose/Throat: Hearing grossly intact, nares w/o erythema or drainage Eyes: PER, EOMI, sclera nonicteric.  Neck: Supple, no large masses.   Pulmonary:   Good air movement, no audible wheezing bilaterally, no use of accessory muscles.  Cardiac: RRR, no JVD Vascular:  eschar posterior right AKA stump consistent with ischemic breakdown Vessel Right Left  Radial Palpable Palpable  PT AKA AKA  DP AKA AKA  Gastrointestinal: Non-distended. No guarding/no peritoneal signs.  Musculoskeletal: M/S 5/5 throughout.  Bilateral AKA right with ulcer left warm and intact Neurologic: CN 2-12 intact. Symmetrical.  Speech is fluent. Motor exam as listed above. Psychiatric: Judgment intact, Mood & affect appropriate for pt's clinical situation. Dermatologic: No rashes + ulcers noted.  No changes consistent with cellulitis.  CBC Lab Results  Component Value Date   WBC 14.3 (H) 06/22/2020   HGB 8.0 (L) 06/22/2020  HCT 26.6 (L) 06/22/2020   MCV 86.6 06/22/2020   PLT 382 06/22/2020    BMET    Component Value Date/Time   NA 138 06/22/2020 2031   NA 131 (L) 02/13/2015 0426   K 3.9 06/22/2020 2031   K 3.0 (L) 02/13/2015 0426   CL 109 06/22/2020 2031   CL 103 02/13/2015 0426   CO2 19 (L) 06/22/2020 2031   CO2 23 02/13/2015 0426   GLUCOSE 96 06/22/2020 2031   GLUCOSE 104 (H) 02/13/2015 0426   BUN 26 (H) 06/22/2020 2031   BUN 10 02/13/2015 0426   CREATININE 0.60 06/22/2020 2031   CREATININE 0.83 02/13/2015 0426   CALCIUM 8.8 (L) 06/22/2020 2031   CALCIUM 7.3 (L) 02/13/2015 0426   GFRNONAA >60 06/22/2020 2031   GFRNONAA >60 02/13/2015 0426   GFRAA >60 06/22/2020 2031   GFRAA >60 02/13/2015 0426   Estimated Creatinine Clearance: 25.4 mL/min (by C-G formula based on SCr of 0.6 mg/dL).  COAG Lab Results  Component Value Date   INR 1.1 02/29/2020   INR 1.36 07/28/2016   INR 2.6 07/24/2016    Radiology MR LUMBAR SPINE WO CONTRAST  Result Date: 06/09/2020 CLINICAL DATA:  Burning pain on the anterior and medial aspect of the right leg for a few months EXAM: MRI LUMBAR SPINE WITHOUT CONTRAST TECHNIQUE: Multiplanar, multisequence MR imaging of  the lumbar spine was performed. No intravenous contrast was administered. COMPARISON:  None. FINDINGS: Segmentation: 5 lumbar type vertebrae slice in the available coverage Alignment:  Mild levocurvature Vertebrae: No recent fracture, evidence of discitis, or bone lesion. Small remote superior endplate fracture at L5 Conus medullaris and cauda equina: Conus extends to the T12-L1 level. Conus and cauda equina appear normal. Paraspinal and other soft tissues: Marked left hydronephrosis and renal cortical thinning. Small right renal cystic intensity Disc levels: T12- L1: Unremarkable. L1-L2: Unremarkable. L2-L3: Mild disc bulging eccentric to the right.  No impingement L3-L4: Mild, mainly far-lateral annulus bulging.  No impingement L4-L5: Mild facet spurring and ligamentum flavum thickening. Borderline anterolisthesis. The disc is bulging asymmetric to the left. Mild left foraminal narrowing L5-S1:Disc narrowing and bulging eccentric to the left. Mild left facet spurring. Mild left foraminal narrowing IMPRESSION: 1. Overall mild degenerative disease and levoscoliosis. No neural compression. 2. Chronic left hydronephrosis with renal atrophy. Electronically Signed   By: Marnee Spring M.D.   On: 06/09/2020 05:46   DG FEMUR, MIN 2 VIEWS RIGHT  Result Date: 06/22/2020 CLINICAL DATA:  Right side pain EXAM: RIGHT FEMUR 2 VIEWS COMPARISON:  07/28/2016 FINDINGS: Changes of prior amputation at the level of the mid right femur. No acute bony abnormality. Specifically, no fracture, subluxation, or dislocation. No radiographic changes of osteomyelitis. IMPRESSION: No acute bony abnormality. Electronically Signed   By: Charlett Nose M.D.   On: 06/22/2020 23:34     Assessment/Plan 1. AKA stump complication (HCC)  Recommend:  The patient has evidence of severe atherosclerotic changes of both lower extremities associated with ulceration and tissue loss of the right AKA stump.  This represents a limb threatening ischemia  and places the patient at the risk for further limb loss.  Patient should undergo CT angiogram of the aorta and lower extremities to try to define her current vascular status.  Patient agrees to proceed with CT angiography.  The patient will follow up with me in the office after the procedure.   - CT ANGIO AO+BIFEM W & OR WO CONTRAST; Future  2. Atherosclerosis of abdominal aorta (HCC)  See #1 - CT ANGIO AO+BIFEM W & OR WO CONTRAST; Future  3. Essential hypertension Continue antihypertensive medications as already ordered, these medications have been reviewed and there are no changes at this time.   4. Chronic systolic heart failure (HCC) Continue cardiac and antihypertensive medications as already ordered and reviewed, no changes at this time.  Continue statin as ordered and reviewed, no changes at this time  Nitrates PRN for chest pain   5. Stump pain (HCC) Patient has ischemia of her right AKA and tissue loss She is describing 10/10 rest pain and I will Rx narcotics - oxyCODONE-acetaminophen (PERCOCET/ROXICET) 5-325 MG tablet; Take 1 tablet by mouth every 6 (six) hours as needed for up to 5 days for moderate pain or severe pain.  Dispense: 40 tablet; Refill: 0    Levora Dredge, MD  06/24/2020 2:24 PM

## 2020-06-25 ENCOUNTER — Other Ambulatory Visit: Payer: Self-pay

## 2020-06-25 ENCOUNTER — Telehealth: Payer: Self-pay

## 2020-06-25 MED ORDER — CLOPIDOGREL BISULFATE 75 MG PO TABS
75.0000 mg | ORAL_TABLET | Freq: Every day | ORAL | 3 refills | Status: DC
Start: 2020-06-25 — End: 2020-08-21

## 2020-06-25 NOTE — Telephone Encounter (Signed)
CONFIRMED PT APPT 06/29/20. BR °

## 2020-06-27 ENCOUNTER — Encounter (INDEPENDENT_AMBULATORY_CARE_PROVIDER_SITE_OTHER): Payer: Self-pay | Admitting: Vascular Surgery

## 2020-06-29 ENCOUNTER — Ambulatory Visit: Payer: Medicare HMO | Admitting: Nurse Practitioner

## 2020-06-30 ENCOUNTER — Other Ambulatory Visit: Payer: Self-pay

## 2020-06-30 ENCOUNTER — Telehealth: Payer: Self-pay

## 2020-06-30 ENCOUNTER — Ambulatory Visit
Admission: RE | Admit: 2020-06-30 | Discharge: 2020-06-30 | Disposition: A | Discharge status: Home / Self Care | Payer: Medicare HMO | Source: Ambulatory Visit | Attending: Vascular Surgery | Admitting: Vascular Surgery

## 2020-06-30 DIAGNOSIS — T879 Unspecified complications of amputation stump: Secondary | ICD-10-CM | POA: Diagnosis not present

## 2020-06-30 DIAGNOSIS — I700 Atherosclerosis of aorta: Secondary | ICD-10-CM

## 2020-06-30 DIAGNOSIS — N200 Calculus of kidney: Secondary | ICD-10-CM | POA: Diagnosis not present

## 2020-06-30 DIAGNOSIS — I745 Embolism and thrombosis of iliac artery: Secondary | ICD-10-CM | POA: Diagnosis not present

## 2020-06-30 DIAGNOSIS — N280 Ischemia and infarction of kidney: Secondary | ICD-10-CM | POA: Diagnosis not present

## 2020-06-30 DIAGNOSIS — I774 Celiac artery compression syndrome: Secondary | ICD-10-CM | POA: Diagnosis not present

## 2020-06-30 MED ORDER — IOHEXOL 350 MG/ML SOLN
100.0000 mL | Freq: Once | INTRAVENOUS | Status: AC | PRN
  Administered 2020-06-30: 100 mL via INTRAVENOUS

## 2020-06-30 NOTE — Telephone Encounter (Signed)
Spoke with patient regarding hospital follow up. She has been schd with her vascular surgeon and they have her scheduled for procedures. [name]

## 2020-07-01 ENCOUNTER — Ambulatory Visit (INDEPENDENT_AMBULATORY_CARE_PROVIDER_SITE_OTHER): Payer: Medicare HMO | Admitting: Vascular Surgery

## 2020-07-05 ENCOUNTER — Encounter (INDEPENDENT_AMBULATORY_CARE_PROVIDER_SITE_OTHER): Payer: Self-pay | Admitting: Vascular Surgery

## 2020-07-05 ENCOUNTER — Ambulatory Visit (INDEPENDENT_AMBULATORY_CARE_PROVIDER_SITE_OTHER): Payer: Medicare HMO | Admitting: Vascular Surgery

## 2020-07-05 ENCOUNTER — Other Ambulatory Visit: Payer: Self-pay

## 2020-07-05 VITALS — BP 101/65 | HR 98 | Resp 16

## 2020-07-05 DIAGNOSIS — T8789 Other complications of amputation stump: Secondary | ICD-10-CM

## 2020-07-05 DIAGNOSIS — I5022 Chronic systolic (congestive) heart failure: Secondary | ICD-10-CM | POA: Diagnosis not present

## 2020-07-05 DIAGNOSIS — I7025 Atherosclerosis of native arteries of other extremities with ulceration: Secondary | ICD-10-CM

## 2020-07-05 DIAGNOSIS — T879 Unspecified complications of amputation stump: Secondary | ICD-10-CM | POA: Diagnosis not present

## 2020-07-05 DIAGNOSIS — I10 Essential (primary) hypertension: Secondary | ICD-10-CM | POA: Diagnosis not present

## 2020-07-05 DIAGNOSIS — M79609 Pain in unspecified limb: Secondary | ICD-10-CM | POA: Diagnosis not present

## 2020-07-05 DIAGNOSIS — I70269 Atherosclerosis of native arteries of extremities with gangrene, unspecified extremity: Secondary | ICD-10-CM | POA: Insufficient documentation

## 2020-07-05 MED ORDER — HYDROCODONE-ACETAMINOPHEN 5-325 MG PO TABS: 1.0000 | ORAL_TABLET | Freq: Four times a day (QID) | ORAL | 0 refills | Status: DC | PRN

## 2020-07-05 NOTE — Progress Notes (Signed)
  MRN : [id number]  [name] is a 74 y.o. (01/23/1946) female who presents with chief complaint of  Chief Complaint  Patient presents with  . Follow-up    ct results  .  History of Present Illness:   Patient returns to the office for follow-up and review of the CT angiogram.  She notes that post angiogram she developed severe nausea and vomiting as well as diarrhea.  She also notes that since the CT scan her left arm is been somewhat swollen and she is having difficulty raising it over her head.  The contrast injection was in the left arm.  She continues to have excruciating pain in her right above-knee amputation stump.  Her husband states she is not really sleeping at all just for short periods of time and then waking up in severe pain.  CT scan is reviewed by myself personally I have also shown it to the patient and her husband.  She is nonreconstructable.  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   Current Meds  Medication Sig  . aspirin 81 MG EC tablet Take by mouth.  . aspirin 81 MG tablet Take 81 mg by mouth every morning.   . atorvastatin (LIPITOR) 40 MG tablet Take 1 tablet (40 mg total) by mouth at bedtime.  . atorvastatin (LIPITOR) 40 MG tablet Take by mouth.  . cholecalciferol (VITAMIN D3) 25 MCG (1000 UNIT) tablet Take 1,000 Units by mouth daily.  . clopidogrel (PLAVIX) 75 MG tablet Take 1 tablet (75 mg total) by mouth daily.  . diphenhydrAMINE (BENADRYL) 25 mg capsule Take 1 capsule (25 mg total) by mouth as directed. Take one pill night before CT scan and one pill the morning of the scan  . famotidine (PEPCID) 20 MG tablet Take 1 tablet (20 mg total) by mouth every morning.  . famotidine (PEPCID) 20 MG tablet Take by mouth.  . ferrous sulfate 325 (65 FE) MG EC tablet Take 1 tablet (325 mg total) by mouth 2 (two) times daily.  .  gabapentin (NEURONTIN) 300 MG capsule Take 1 capsule (300 mg total) by mouth 2 (two) times daily.  . HYDROcodone-acetaminophen (NORCO/VICODIN) 5-325 MG tablet One tab bid prn for pain of amputation  . lisinopril (ZESTRIL) 20 MG tablet Take 1 tablet (20 mg total) by mouth daily.  . magnesium hydroxide (MILK OF MAGNESIA) 400 MG/5ML suspension Take 30 mLs by mouth daily as needed for mild constipation.  . methylPREDNISolone (MEDROL DOSEPAK) 4 MG TBPK tablet Dispense one dose pack begin the night of the CT scan and use as directed  . predniSONE (DELTASONE) 50 MG tablet Take 1 tablet (50 mg total) by mouth as directed. Take one tab 13 hours before the CT scan; Take one tab 7 hours before the CT scan; Take one tab 1 hour before the CT scan  . sulfamethoxazole-trimethoprim (BACTRIM DS) 800-160 MG tablet Take 1 tablet by mouth 2 (two) times daily.    Past Medical History:  Diagnosis Date  . Hypertension   . Peripheral vascular disease (HCC)   . Pneumonia 07/14/15  . Tobacco abuse   . Unilateral small kidney    a. one functional kidney    Past Surgical History:  Procedure Laterality Date  . ABOVE KNEE LEG AMPUTATION Right 01/2015  . AMPUTATION Left 07/16/2015   Procedure: AMPUTATION ABOVE KNEE;    Surgeon: Shanautica Forker G Hutson Luft, MD;  Location: [facility];  Service: Vascular;  Laterality: Left;  . AMPUTATION Left 07/23/2015   Procedure:   WOUND CLOSURE;  Surgeon: Brinlynn Gorton G Maiana Hennigan, MD;  Location: [facility];  Service: Vascular;  Laterality: Left;  . AMPUTATION Left 11/19/2015   Procedure:  ( REVISION ) AMPUTATION ABOVE KNEE ;  Surgeon: Braden Cimo G Nikkolas Coomes, MD;  Location: [facility];  Service: Vascular;  Laterality: Left;  . APPENDECTOMY  1969  . BREAST BIOPSY Right 1970   neg  . ECTOPIC PREGNANCY SURGERY Right 1969  . FOREIGN BODY REMOVAL Left 07/28/2016   Procedure: FOREIGN BODY REMOVAL ADULT ( LEG );  Surgeon: Nikoli Nasser G Aibhlinn Kalmar, MD;  Location: [facility];  Service: Vascular;  Laterality: Left;  . PERIPHERAL  VASCULAR CATHETERIZATION N/A 07/22/2015   Procedure: Abdominal Aortogram w/Lower Extremity;  Surgeon: Jason S Dew, MD;  Location: [facility];  Service: Cardiovascular;  Laterality: N/A;  . PERIPHERAL VASCULAR CATHETERIZATION  07/22/2015   Procedure: Lower Extremity Intervention;  Surgeon: Jason S Dew, MD;  Location: [facility];  Service: Cardiovascular;;  . stents left leg    . VISCERAL ANGIOGRAPHY N/A 03/02/2020   Procedure: VISCERAL ANGIOGRAPHY;  Surgeon: Dew, Jason S, MD;  Location: [facility];  Service: Cardiovascular;  Laterality: N/A;    Social History Social History   Tobacco Use  . Smoking status: Light Tobacco Smoker    Packs/day: 0.25    Years: 55.00    Pack years: 13.75    Last attempt to quit: 01/20/2015    Years since quitting: 5.4  . Smokeless tobacco: Never Used  Vaping Use  . Vaping Use: Never used  Substance Use Topics  . Alcohol use: No  . Drug use: No    Family History Family History  Problem Relation Age of Onset  . CAD Mother   . Hypertension Mother   . Stroke Mother   . CAD Father   . Hypertension Father   . Breast cancer Neg Hx     Allergies  Allergen Reactions  . Contrast Media [Iodinated Diagnostic Agents] Rash  . Morphine And Related Diarrhea and Nausea And Vomiting     REVIEW OF SYSTEMS (Negative unless checked)  Constitutional: []Weight loss  []Fever  []Chills Cardiac: []Chest pain   []Chest pressure   []Palpitations   []Shortness of breath when laying flat   []Shortness of breath with exertion. Vascular:  []Pain in legs with walking   [x]Pain in legs at rest  []History of DVT   []Phlebitis   []Swelling in legs   []Varicose veins   []Non-healing ulcers Pulmonary:   []Uses home oxygen   []Productive cough   []Hemoptysis   []Wheeze  []COPD   []Asthma Neurologic:  []Dizziness   []Seizures   []History of stroke   []History of TIA  []Aphasia   []Vissual changes   []Weakness or numbness in arm   []Weakness or numbness  in leg Musculoskeletal:   []Joint swelling   []Joint pain   []Low back pain Hematologic:  []Easy bruising  []Easy bleeding   []Hypercoagulable state   []Anemic Gastrointestinal:  []Diarrhea   []Vomiting  []Gastroesophageal reflux/heartburn   []Difficulty swallowing. Genitourinary:  []Chronic kidney disease   []Difficult urination  []Frequent urination   []Blood in urine Skin:  []Rashes   []Ulcers  Psychological:  []History of anxiety   [] History of major depression.  Physical Examination  Vitals:   07/05/20 1544  BP: 101/65  Pulse:   98  Resp: 16   There is no height or weight on file to calculate BMI. Gen: WD/WN, NAD Head: [place]/AT, No temporalis wasting.  Ear/Nose/Throat: Hearing grossly intact, nares w/o erythema or drainage Eyes: PER, EOMI, sclera nonicteric.  Neck: Supple, no large masses.   Pulmonary:  Good air movement, no audible wheezing bilaterally, no use of accessory muscles.  Cardiac: RRR, no JVD Vascular: Eschar right above-knee amputation Vessel Right Left  Radial Palpable Palpable  Gastrointestinal: Non-distended. No guarding/no peritoneal signs.  Musculoskeletal: M/S 5/5 throughout.  No deformity or atrophy.  Neurologic: CN 2-12 intact. Symmetrical.  Speech is fluent. Motor exam as listed above. Psychiatric: Judgment intact, Mood & affect appropriate for pt's clinical situation. Dermatologic: No rashes or ulcers noted.  No changes consistent with cellulitis.   CBC Lab Results  Component Value Date   WBC 14.3 (H) 06/22/2020   HGB 8.0 (L) 06/22/2020   HCT 26.6 (L) 06/22/2020   MCV 86.6 06/22/2020   PLT 382 06/22/2020    BMET    Component Value Date/Time   NA 138 06/22/2020 2031   NA 131 (L) 02/13/2015 0426   K 3.9 06/22/2020 2031   K 3.0 (L) 02/13/2015 0426   CL 109 06/22/2020 2031   CL 103 02/13/2015 0426   CO2 19 (L) 06/22/2020 2031   CO2 23 02/13/2015 0426   GLUCOSE 96 06/22/2020 2031   GLUCOSE 104 (H) 02/13/2015 0426   BUN 26 (H) 06/22/2020 2031    BUN 10 02/13/2015 0426   CREATININE 0.60 06/22/2020 2031   CREATININE 0.83 02/13/2015 0426   CALCIUM 8.8 (L) 06/22/2020 2031   CALCIUM 7.3 (L) 02/13/2015 0426   GFRNONAA >60 06/22/2020 2031   GFRNONAA >60 02/13/2015 0426   GFRAA >60 06/22/2020 2031   GFRAA >60 02/13/2015 0426   Estimated Creatinine Clearance: 25.4 mL/min (by C-G formula based on SCr of 0.6 mg/dL).  COAG Lab Results  Component Value Date   INR 1.1 02/29/2020   INR 1.36 07/28/2016   INR 2.6 07/24/2016    Radiology MR LUMBAR SPINE WO CONTRAST  Result Date: 06/09/2020 CLINICAL DATA:  Burning pain on the anterior and medial aspect of the right leg for a few months EXAM: MRI LUMBAR SPINE WITHOUT CONTRAST TECHNIQUE: Multiplanar, multisequence MR imaging of the lumbar spine was performed. No intravenous contrast was administered. COMPARISON:  None. FINDINGS: Segmentation: 5 lumbar type vertebrae slice in the available coverage Alignment:  Mild levocurvature Vertebrae: No recent fracture, evidence of discitis, or bone lesion. Small remote superior endplate fracture at L5 Conus medullaris and cauda equina: Conus extends to the T12-L1 level. Conus and cauda equina appear normal. Paraspinal and other soft tissues: Marked left hydronephrosis and renal cortical thinning. Small right renal cystic intensity Disc levels: T12- L1: Unremarkable. L1-L2: Unremarkable. L2-L3: Mild disc bulging eccentric to the right.  No impingement L3-L4: Mild, mainly far-lateral annulus bulging.  No impingement L4-L5: Mild facet spurring and ligamentum flavum thickening. Borderline anterolisthesis. The disc is bulging asymmetric to the left. Mild left foraminal narrowing L5-S1:Disc narrowing and bulging eccentric to the left. Mild left facet spurring. Mild left foraminal narrowing IMPRESSION: 1. Overall mild degenerative disease and levoscoliosis. No neural compression. 2. Chronic left hydronephrosis with renal atrophy. Electronically Signed   By: Jonathon   Watts M.D.   On: 06/09/2020 05:46   CT ANGIO AO+BIFEM W & OR WO CONTRAST  Result Date: 06/30/2020 CLINICAL DATA:  History of peripheral vascular disease and prior bilateral above knee lower extremity amputation. Ulceration   and tissue loss at the level of the right a KA stump with associated severe pain. Prior CTA demonstrated distal aortic occlusion. EXAM: CT ANGIOGRAPHY OF ABDOMINAL AORTA WITH ILIOFEMORAL RUNOFF TECHNIQUE: Multidetector CT imaging of the abdomen, pelvis and lower extremities was performed using the standard protocol during bolus administration of intravenous contrast. Multiplanar CT image reconstructions and MIPs were obtained to evaluate the vascular anatomy. CONTRAST:  OMNIPAQUE IOHEXOL 350 MG/ML SOLN COMPARISON:  CTA of the abdomen and pelvis 02/29/2020 FINDINGS: VASCULAR Aorta: Compared to the prior study the abdominal aorta is now occluded immediately below the takeoff of the left renal artery compared to just below the origin of the IMA on the prior study. Celiac: Stable 50-60% origin stenosis. Distal branches remain patent. SMA: Stable atherosclerosis involving the SMA trunk with up to approximately 50% stenosis. Renals: Stable patency of the main superior right renal artery. The tiny accessory artery supplying the tip of the lower pole is no longer patent and has occluded since the prior study. Stable patency of single left renal artery. IMA: Occluded. RIGHT Lower Extremity Inflow: Previously stented common and external iliac arteries remain chronically occluded. There is some faint collateral reconstitution of some distal internal iliac branches at the pelvic floor. Outflow: The inferior epigastric artery is patent but this is not significantly reconstitute the common femoral artery or other arteries in the remaining right lower extremity. There is no reconstitution of a previously stented SFA. There are some small pelvic floor collateral branches that ultimately supply some flow  to tiny profunda femoral muscular branches in the right thigh. LEFT Lower Extremity Inflow: Previously stented left common and external iliac arteries remain chronically occluded. Reconstitution of the obturator artery and some other distal pelvic floor arteries appears to be primarily from collateral supply from the inferior epigastric artery. Outflow: There is no reconstitution of the common femoral artery or SFA. There are some reconstituted small profunda femoral branches extending into the thigh. Review of the MIP images confirms the above findings. NON-VASCULAR Lower chest: No acute abnormality. Hepatobiliary: No focal liver abnormality is seen. No gallstones, gallbladder wall thickening, or biliary dilatation. Pancreas: Unremarkable. No pancreatic ductal dilatation or surrounding inflammatory changes. Spleen: Stable hypervascular rounded lesion in the posterior spleen measuring 11 mm and likely representing a hemangioma. Adrenals/Urinary Tract: Unremarkable adrenal glands. Stable chronic obstruction of the left kidney with renal atrophy and calculi in the dependent aspect of the lower pole. No evidence of right renal obstruction. Stomach/Bowel: Bowel shows moderate stool in the colon without evidence of overt bowel obstruction. No free air. No inflammatory process identified. Lymphatic: No enlarged lymph nodes identified in the abdomen or pelvis. Reproductive: Uterus and bilateral adnexa are unremarkable. Other: No abdominal wall hernia or abnormality. No abdominopelvic ascites. Musculoskeletal: No acute findings. IMPRESSION: 1. 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 On the prior scan in May, aortic occlusion was just below the level of the IMA origin. 2. 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 3. No reconstitution of  bilateral common femoral arteries or SFA's. There are some small pelvic floor collateral branches and inferior epigastric supply that ultimately reconstitutes some flow to tiny profunda femoral muscular branches in the thighs. 4. 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 5. Interval occlusion of a tiny accessory artery supplying the tip of the lower pole of the right kidney. 6. Stable chronic  obstruction of the left kidney with renal atrophy and calculi in the dependent aspect of the lower pole. 7. Stable 11 mm hypervascular rounded lesion in the posterior spleen likely representing a hemangioma. Electronically Signed   By: Glenn  Yamagata M.D.   On: 06/30/2020 15:46   DG FEMUR, MIN 2 VIEWS RIGHT  Result Date: 06/22/2020 CLINICAL DATA:  Right side pain EXAM: RIGHT FEMUR 2 VIEWS COMPARISON:  07/28/2016 FINDINGS: Changes of prior amputation at the level of the mid right femur. No acute bony abnormality. Specifically, no fracture, subluxation, or dislocation. No radiographic changes of osteomyelitis. IMPRESSION: No acute bony abnormality. Electronically Signed   By: Kevin  Dover M.D.   On: 06/22/2020 23:34     Assessment/Plan 1. AKA stump complication (HCC) The patient has gangrenous changes to the skin in the medial distal right above-knee amputation stump.  CT scan does not show any definable target that would allow for a reconstruction of her arterial inflow.  Given this fact I do not think revising her amputation will heal she is not even a candidate for a hip disarticulation.  At this point what I can offer is a local debridement.  This will allow me to do directly assess the perfusion of the tissues.  I will plan to place a VAC as I do not think primary closure will heal.  Hopefully this will provide some pain relief.  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 Essential hypertension Continue antihypertensive medications as already ordered, these medications have been reviewed and there are no changes at this time.   4. Chronic systolic heart failure (HCC) Continue cardiac and antihypertensive medications as already ordered and reviewed, no changes at this time.  Continue statin as ordered and reviewed, no changes at this time  Nitrates PRN for chest pain   5. Stump pain (HCC) See #1 - HYDROcodone-acetaminophen (NORCO/VICODIN) 5-325 MG tablet; Take 1-2 tablets by mouth every 6 (six) hours as needed for moderate pain or severe pain. One tab bid prn for pain of amputation  Dispense: 60 tablet; Refill: 0   Conswella Bruney, MD  07/05/2020 4:29 PM 

## 2020-07-05 NOTE — H&P (View-Only) (Signed)
MRN : [id number]  [name] is a 74 y.o. (03-Feb-1946) female who presents with chief complaint of  Chief Complaint  Patient presents with  . Follow-up    ct results  .  History of Present Illness:   Patient returns to the office for follow-up and review of the CT angiogram.  She notes that post angiogram she developed severe nausea and vomiting as well as diarrhea.  She also notes that since the CT scan her left arm is been somewhat swollen and she is having difficulty raising it over her head.  The contrast injection was in the left arm.  She continues to have excruciating pain in her right above-knee amputation stump.  Her husband states she is not really sleeping at all just for short periods of time and then waking up in severe pain.  CT scan is reviewed by myself personally I have also shown it to the patient and her husband.  She is nonreconstructable.  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   Current Meds  Medication Sig  . aspirin 81 MG EC tablet Take by mouth.  Marland Kitchen. aspirin 81 MG tablet Take 81 mg by mouth every morning.   Marland Kitchen. atorvastatin (LIPITOR) 40 MG tablet Take 1 tablet (40 mg total) by mouth at bedtime.  Marland Kitchen. atorvastatin (LIPITOR) 40 MG tablet Take by mouth.  . cholecalciferol (VITAMIN D3) 25 MCG (1000 UNIT) tablet Take 1,000 Units by mouth daily.  . clopidogrel (PLAVIX) 75 MG tablet Take 1 tablet (75 mg total) by mouth daily.  . diphenhydrAMINE (BENADRYL) 25 mg capsule Take 1 capsule (25 mg total) by mouth as directed. Take one pill night before CT scan and one pill the morning of the scan  . famotidine (PEPCID) 20 MG tablet Take 1 tablet (20 mg total) by mouth every morning.  . famotidine (PEPCID) 20 MG tablet Take by mouth.  . ferrous sulfate 325 (65 FE) MG EC tablet Take 1 tablet (325 mg total) by mouth 2 (two) times daily.  Marland Kitchen.  gabapentin (NEURONTIN) 300 MG capsule Take 1 capsule (300 mg total) by mouth 2 (two) times daily.  Marland Kitchen. HYDROcodone-acetaminophen (NORCO/VICODIN) 5-325 MG tablet One tab bid prn for pain of amputation  . lisinopril (ZESTRIL) 20 MG tablet Take 1 tablet (20 mg total) by mouth daily.  . magnesium hydroxide (MILK OF MAGNESIA) 400 MG/5ML suspension Take 30 mLs by mouth daily as needed for mild constipation.  . methylPREDNISolone (MEDROL DOSEPAK) 4 MG TBPK tablet Dispense one dose pack begin the night of the CT scan and use as directed  . predniSONE (DELTASONE) 50 MG tablet Take 1 tablet (50 mg total) by mouth as directed. Take one tab 13 hours before the CT scan; Take one tab 7 hours before the CT scan; Take one tab 1 hour before the CT scan  . sulfamethoxazole-trimethoprim (BACTRIM DS) 800-160 MG tablet Take 1 tablet by mouth 2 (two) times daily.    Past Medical History:  Diagnosis Date  . Hypertension   . Peripheral vascular disease (HCC)   . Pneumonia 07/14/15  . Tobacco abuse   . Unilateral small kidney    a. one functional kidney    Past Surgical History:  Procedure Laterality Date  . ABOVE KNEE LEG AMPUTATION Right 01/2015  . AMPUTATION Left 07/16/2015   Procedure: AMPUTATION ABOVE KNEE;  Surgeon: Renford Dills, MD;  Location: [facility];  Service: Vascular;  Laterality: Left;  . AMPUTATION Left 07/23/2015   Procedure:   WOUND CLOSURE;  Surgeon: Renford Dills, MD;  Location: [facility];  Service: Vascular;  Laterality: Left;  . AMPUTATION Left 11/19/2015   Procedure:  ( REVISION ) AMPUTATION ABOVE KNEE ;  Surgeon: Renford Dills, MD;  Location: [facility];  Service: Vascular;  Laterality: Left;  . APPENDECTOMY  1969  . BREAST BIOPSY Right 1970   neg  . ECTOPIC PREGNANCY SURGERY Right 1969  . FOREIGN BODY REMOVAL Left 07/28/2016   Procedure: FOREIGN BODY REMOVAL ADULT ( LEG );  Surgeon: Renford Dills, MD;  Location: [facility];  Service: Vascular;  Laterality: Left;  . PERIPHERAL  VASCULAR CATHETERIZATION N/A 07/22/2015   Procedure: Abdominal Aortogram w/Lower Extremity;  Surgeon: Annice Needy, MD;  Location: [facility];  Service: Cardiovascular;  Laterality: N/A;  . PERIPHERAL VASCULAR CATHETERIZATION  07/22/2015   Procedure: Lower Extremity Intervention;  Surgeon: Annice Needy, MD;  Location: [facility];  Service: Cardiovascular;;  . stents left leg    . VISCERAL ANGIOGRAPHY N/A 03/02/2020   Procedure: VISCERAL ANGIOGRAPHY;  Surgeon: Annice Needy, MD;  Location: [facility];  Service: Cardiovascular;  Laterality: N/A;    Social History Social History   Tobacco Use  . Smoking status: Light Tobacco Smoker    Packs/day: 0.25    Years: 55.00    Pack years: 13.75    Last attempt to quit: 01/20/2015    Years since quitting: 5.4  . Smokeless tobacco: Never Used  Vaping Use  . Vaping Use: Never used  Substance Use Topics  . Alcohol use: No  . Drug use: No    Family History Family History  Problem Relation Age of Onset  . CAD Mother   . Hypertension Mother   . Stroke Mother   . CAD Father   . Hypertension Father   . Breast cancer Neg Hx     Allergies  Allergen Reactions  . Contrast Media [Iodinated Diagnostic Agents] Rash  . Morphine And Related Diarrhea and Nausea And Vomiting     REVIEW OF SYSTEMS (Negative unless checked)  Constitutional: [] Weight loss  [] Fever  [] Chills Cardiac: [] Chest pain   [] Chest pressure   [] Palpitations   [] Shortness of breath when laying flat   [] Shortness of breath with exertion. Vascular:  [] Pain in legs with walking   [x] Pain in legs at rest  [] History of DVT   [] Phlebitis   [] Swelling in legs   [] Varicose veins   [] Non-healing ulcers Pulmonary:   [] Uses home oxygen   [] Productive cough   [] Hemoptysis   [] Wheeze  [] COPD   [] Asthma Neurologic:  [] Dizziness   [] Seizures   [] History of stroke   [] History of TIA  [] Aphasia   [] Vissual changes   [] Weakness or numbness in arm   [] Weakness or numbness  in leg Musculoskeletal:   [] Joint swelling   [] Joint pain   [] Low back pain Hematologic:  [] Easy bruising  [] Easy bleeding   [] Hypercoagulable state   [] Anemic Gastrointestinal:  [] Diarrhea   [] Vomiting  [] Gastroesophageal reflux/heartburn   [] Difficulty swallowing. Genitourinary:  [] Chronic kidney disease   [] Difficult urination  [] Frequent urination   [] Blood in urine Skin:  [] Rashes   [] Ulcers  Psychological:  [] History of anxiety   []  History of major depression.  Physical Examination  Vitals:   07/05/20 1544  BP: 101/65  Pulse:  98  Resp: 16   There is no height or weight on file to calculate BMI. Gen: WD/WN, NAD Head: [place]/AT, No temporalis wasting.  Ear/Nose/Throat: Hearing grossly intact, nares w/o erythema or drainage Eyes: PER, EOMI, sclera nonicteric.  Neck: Supple, no large masses.   Pulmonary:  Good air movement, no audible wheezing bilaterally, no use of accessory muscles.  Cardiac: RRR, no JVD Vascular: Eschar right above-knee amputation Vessel Right Left  Radial Palpable Palpable  Gastrointestinal: Non-distended. No guarding/no peritoneal signs.  Musculoskeletal: M/S 5/5 throughout.  No deformity or atrophy.  Neurologic: CN 2-12 intact. Symmetrical.  Speech is fluent. Motor exam as listed above. Psychiatric: Judgment intact, Mood & affect appropriate for pt's clinical situation. Dermatologic: No rashes or ulcers noted.  No changes consistent with cellulitis.   CBC Lab Results  Component Value Date   WBC 14.3 (H) 06/22/2020   HGB 8.0 (L) 06/22/2020   HCT 26.6 (L) 06/22/2020   MCV 86.6 06/22/2020   PLT 382 06/22/2020    BMET    Component Value Date/Time   NA 138 06/22/2020 2031   NA 131 (L) 02/13/2015 0426   K 3.9 06/22/2020 2031   K 3.0 (L) 02/13/2015 0426   CL 109 06/22/2020 2031   CL 103 02/13/2015 0426   CO2 19 (L) 06/22/2020 2031   CO2 23 02/13/2015 0426   GLUCOSE 96 06/22/2020 2031   GLUCOSE 104 (H) 02/13/2015 0426   BUN 26 (H) 06/22/2020 2031    BUN 10 02/13/2015 0426   CREATININE 0.60 06/22/2020 2031   CREATININE 0.83 02/13/2015 0426   CALCIUM 8.8 (L) 06/22/2020 2031   CALCIUM 7.3 (L) 02/13/2015 0426   GFRNONAA >60 06/22/2020 2031   GFRNONAA >60 02/13/2015 0426   GFRAA >60 06/22/2020 2031   GFRAA >60 02/13/2015 0426   Estimated Creatinine Clearance: 25.4 mL/min (by C-G formula based on SCr of 0.6 mg/dL).  [facility] Lab Results  Component Value Date   INR 1.1 02/29/2020   INR 1.36 07/28/2016   INR 2.6 07/24/2016    Radiology MR LUMBAR SPINE WO CONTRAST  Result Date: 06/09/2020 CLINICAL DATA:  Burning pain on the anterior and medial aspect of the right leg for a few months EXAM: MRI LUMBAR SPINE WITHOUT CONTRAST TECHNIQUE: Multiplanar, multisequence MR imaging of the lumbar spine was performed. No intravenous contrast was administered. COMPARISON:  None. FINDINGS: Segmentation: 5 lumbar type vertebrae slice in the available coverage Alignment:  Mild levocurvature Vertebrae: No recent fracture, evidence of discitis, or bone lesion. Small remote superior endplate fracture at L5 Conus medullaris and cauda equina: Conus extends to the T12-L1 level. Conus and cauda equina appear normal. Paraspinal and other soft tissues: Marked left hydronephrosis and renal cortical thinning. Small right renal cystic intensity Disc levels: T12- L1: Unremarkable. L1-L2: Unremarkable. L2-L3: Mild disc bulging eccentric to the right.  No impingement L3-L4: Mild, mainly far-lateral annulus bulging.  No impingement L4-L5: Mild facet spurring and ligamentum flavum thickening. Borderline anterolisthesis. The disc is bulging asymmetric to the left. Mild left foraminal narrowing L5-S1:Disc narrowing and bulging eccentric to the left. Mild left facet spurring. Mild left foraminal narrowing IMPRESSION: 1. Overall mild degenerative disease and levoscoliosis. No neural compression. 2. Chronic left hydronephrosis with renal atrophy. Electronically Signed   By: Marnee Spring M.D.   On: 06/09/2020 05:46   CT ANGIO AO+BIFEM W & OR WO CONTRAST  Result Date: 06/30/2020 CLINICAL DATA:  History of peripheral vascular disease and prior bilateral above knee lower extremity amputation. Ulceration  and tissue loss at the level of the right a KA stump with associated severe pain. Prior CTA demonstrated distal aortic occlusion. EXAM: CT ANGIOGRAPHY OF ABDOMINAL AORTA WITH ILIOFEMORAL RUNOFF TECHNIQUE: Multidetector CT imaging of the abdomen, pelvis and lower extremities was performed using the standard protocol during bolus administration of intravenous contrast. Multiplanar CT image reconstructions and MIPs were obtained to evaluate the vascular anatomy. CONTRAST:  OMNIPAQUE IOHEXOL 350 MG/ML SOLN COMPARISON:  CTA of the abdomen and pelvis 02/29/2020 FINDINGS: VASCULAR Aorta: Compared to the prior study the abdominal aorta is now occluded immediately below the takeoff of the left renal artery compared to just below the origin of the IMA on the prior study. Celiac: Stable 50-60% origin stenosis. Distal branches remain patent. SMA: Stable atherosclerosis involving the SMA trunk with up to approximately 50% stenosis. Renals: Stable patency of the main superior right renal artery. The tiny accessory artery supplying the tip of the lower pole is no longer patent and has occluded since the prior study. Stable patency of single left renal artery. IMA: Occluded. RIGHT Lower Extremity Inflow: Previously stented common and external iliac arteries remain chronically occluded. There is some faint collateral reconstitution of some distal internal iliac branches at the pelvic floor. Outflow: The inferior epigastric artery is patent but this is not significantly reconstitute the common femoral artery or other arteries in the remaining right lower extremity. There is no reconstitution of a previously stented SFA. There are some small pelvic floor collateral branches that ultimately supply some flow  to tiny profunda femoral muscular branches in the right thigh. LEFT Lower Extremity Inflow: Previously stented left common and external iliac arteries remain chronically occluded. Reconstitution of the obturator artery and some other distal pelvic floor arteries appears to be primarily from collateral supply from the inferior epigastric artery. Outflow: There is no reconstitution of the common femoral artery or SFA. There are some reconstituted small profunda femoral branches extending into the thigh. Review of the MIP images confirms the above findings. NON-VASCULAR Lower chest: No acute abnormality. Hepatobiliary: No focal liver abnormality is seen. No gallstones, gallbladder wall thickening, or biliary dilatation. Pancreas: Unremarkable. No pancreatic ductal dilatation or surrounding inflammatory changes. Spleen: Stable hypervascular rounded lesion in the posterior spleen measuring 11 mm and likely representing a hemangioma. Adrenals/Urinary Tract: Unremarkable adrenal glands. Stable chronic obstruction of the left kidney with renal atrophy and calculi in the dependent aspect of the lower pole. No evidence of right renal obstruction. Stomach/Bowel: Bowel shows moderate stool in the colon without evidence of overt bowel obstruction. No free air. No inflammatory process identified. Lymphatic: No enlarged lymph nodes identified in the abdomen or pelvis. Reproductive: Uterus and bilateral adnexa are unremarkable. Other: No abdominal wall hernia or abnormality. No abdominopelvic ascites. Musculoskeletal: No acute findings. IMPRESSION: 1. 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 On the prior scan in May, aortic occlusion was just below the level of the IMA origin. 2. 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 3. No reconstitution of  bilateral common femoral arteries or SFA's. There are some small pelvic floor collateral branches and inferior epigastric supply that ultimately reconstitutes some flow to tiny profunda femoral muscular branches in the thighs. 4. 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 5. Interval occlusion of a tiny accessory artery supplying the tip of the lower pole of the right kidney. 6. Stable chronic  obstruction of the left kidney with renal atrophy and calculi in the dependent aspect of the lower pole. 7. Stable 11 mm hypervascular rounded lesion in the posterior spleen likely representing a hemangioma. Electronically Signed   By: Irish Lack M.D.   On: 06/30/2020 15:46   DG FEMUR, MIN 2 VIEWS RIGHT  Result Date: 06/22/2020 CLINICAL DATA:  Right side pain EXAM: RIGHT FEMUR 2 VIEWS COMPARISON:  07/28/2016 FINDINGS: Changes of prior amputation at the level of the mid right femur. No acute bony abnormality. Specifically, no fracture, subluxation, or dislocation. No radiographic changes of osteomyelitis. IMPRESSION: No acute bony abnormality. Electronically Signed   By: Charlett Nose M.D.   On: 06/22/2020 23:34     Assessment/Plan 1. AKA stump complication (HCC) The patient has gangrenous changes to the skin in the medial distal right above-knee amputation stump.  CT scan does not show any definable target that would allow for a reconstruction of her arterial inflow.  Given this fact I do not think revising her amputation will heal she is not even a candidate for a hip disarticulation.  At this point what I can offer is a local debridement.  This will allow me to do directly assess the perfusion of the tissues.  I will plan to place a VAC as I do not think primary closure will heal.  Hopefully this will provide some pain relief.  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 Essential hypertension Continue antihypertensive medications as already ordered, these medications have been reviewed and there are no changes at this time.   4. Chronic systolic heart failure (HCC) Continue cardiac and antihypertensive medications as already ordered and reviewed, no changes at this time.  Continue statin as ordered and reviewed, no changes at this time  Nitrates PRN for chest pain   5. Stump pain (HCC) See #1 - HYDROcodone-acetaminophen (NORCO/VICODIN) 5-325 MG tablet; Take 1-2 tablets by mouth every 6 (six) hours as needed for moderate pain or severe pain. One tab bid prn for pain of amputation  Dispense: 60 tablet; Refill: 0   Levora Dredge, MD  07/05/2020 4:29 PM

## 2020-07-06 ENCOUNTER — Telehealth (INDEPENDENT_AMBULATORY_CARE_PROVIDER_SITE_OTHER): Payer: Self-pay

## 2020-07-06 ENCOUNTER — Other Ambulatory Visit (INDEPENDENT_AMBULATORY_CARE_PROVIDER_SITE_OTHER): Payer: Self-pay | Admitting: Nurse Practitioner

## 2020-07-06 NOTE — Telephone Encounter (Signed)
Spoke with the patient and she is scheduled with Dr. Gilda Crease for a debridement of right aka stump and vac placement on 07/08/20. Pre-op phone call on 07/07/20 between 8-1 and covid testing on 07/07/20 between 8-1 pm at the [facility]. Pre-surgical instructions were discussed with the patient and patient stated she was writing the information down. Christoper Allegra has been contacted for the wound vac as well.

## 2020-07-07 ENCOUNTER — Other Ambulatory Visit
Admission: RE | Admit: 2020-07-07 | Discharge: 2020-07-07 | Disposition: A | Discharge status: Home / Self Care | Payer: Medicare HMO | Source: Ambulatory Visit | Attending: Vascular Surgery | Admitting: Vascular Surgery

## 2020-07-07 ENCOUNTER — Other Ambulatory Visit: Payer: Self-pay

## 2020-07-07 ENCOUNTER — Encounter
Admission: RE | Admit: 2020-07-07 | Discharge: 2020-07-07 | Disposition: A | Discharge status: Home / Self Care | Payer: Medicare HMO | Source: Ambulatory Visit | Attending: Vascular Surgery | Admitting: Vascular Surgery

## 2020-07-07 DIAGNOSIS — I998 Other disorder of circulatory system: Secondary | ICD-10-CM | POA: Diagnosis not present

## 2020-07-07 DIAGNOSIS — F17200 Nicotine dependence, unspecified, uncomplicated: Secondary | ICD-10-CM | POA: Diagnosis not present

## 2020-07-07 DIAGNOSIS — Z8249 Family history of ischemic heart disease and other diseases of the circulatory system: Secondary | ICD-10-CM | POA: Diagnosis not present

## 2020-07-07 DIAGNOSIS — Z87442 Personal history of urinary calculi: Secondary | ICD-10-CM | POA: Diagnosis not present

## 2020-07-07 DIAGNOSIS — Z91041 Radiographic dye allergy status: Secondary | ICD-10-CM | POA: Diagnosis not present

## 2020-07-07 DIAGNOSIS — I509 Heart failure, unspecified: Secondary | ICD-10-CM | POA: Diagnosis not present

## 2020-07-07 DIAGNOSIS — K219 Gastro-esophageal reflux disease without esophagitis: Secondary | ICD-10-CM | POA: Diagnosis not present

## 2020-07-07 DIAGNOSIS — Z01812 Encounter for preprocedural laboratory examination: Secondary | ICD-10-CM | POA: Insufficient documentation

## 2020-07-07 DIAGNOSIS — X58XXXA Exposure to other specified factors, initial encounter: Secondary | ICD-10-CM | POA: Diagnosis not present

## 2020-07-07 DIAGNOSIS — I110 Hypertensive heart disease with heart failure: Secondary | ICD-10-CM | POA: Diagnosis not present

## 2020-07-07 DIAGNOSIS — M1990 Unspecified osteoarthritis, unspecified site: Secondary | ICD-10-CM | POA: Diagnosis not present

## 2020-07-07 DIAGNOSIS — D649 Anemia, unspecified: Secondary | ICD-10-CM | POA: Diagnosis not present

## 2020-07-07 DIAGNOSIS — I081 Rheumatic disorders of both mitral and tricuspid valves: Secondary | ICD-10-CM | POA: Diagnosis not present

## 2020-07-07 DIAGNOSIS — Z20822 Contact with and (suspected) exposure to covid-19: Secondary | ICD-10-CM | POA: Insufficient documentation

## 2020-07-07 DIAGNOSIS — T8789 Other complications of amputation stump: Secondary | ICD-10-CM | POA: Diagnosis present

## 2020-07-07 DIAGNOSIS — Z823 Family history of stroke: Secondary | ICD-10-CM | POA: Diagnosis not present

## 2020-07-07 DIAGNOSIS — I739 Peripheral vascular disease, unspecified: Secondary | ICD-10-CM | POA: Diagnosis not present

## 2020-07-07 DIAGNOSIS — Z885 Allergy status to narcotic agent status: Secondary | ICD-10-CM | POA: Diagnosis not present

## 2020-07-07 HISTORY — DX: Unspecified osteoarthritis, unspecified site: M19.90

## 2020-07-07 HISTORY — DX: Gastro-esophageal reflux disease without esophagitis: K21.9

## 2020-07-07 HISTORY — DX: Personal history of urinary calculi: Z87.442

## 2020-07-07 HISTORY — DX: Personal history of other diseases of the digestive system: Z87.19

## 2020-07-07 HISTORY — DX: Cardiac arrhythmia, unspecified: I49.9

## 2020-07-07 NOTE — Patient Instructions (Addendum)
Your procedure is scheduled on:07/08/2020 Report to Day Surgery on the 2nd floor of the [facility]. AT 6AM.   REMEMBER: Instructions that are not followed completely may result in serious medical risk, up to and including death; or upon the discretion of your surgeon and anesthesiologist your surgery may need to be rescheduled.  Do not eat food after midnight the night before surgery.  No gum chewing, lozengers or hard candies.  You may however, drink CLEAR liquids up to 2 hours before you are scheduled to arrive for your surgery. Do not drink anything within 2 hours of your scheduled arrival time.  Clear liquids include: - water  - apple juice without pulp - gatorade (not RED) - black coffee or tea (Do NOT add milk or creamers to the coffee or tea) Do NOT drink anything that is not on this list.  Type 1 and Type 2 diabetics should only drink water.  TAKE THESE MEDICATIONS THE MORNING OF SURGERY WITH A SIP OF WATER:  - PEPCID NIGHT BEFORE AND THE MORNING BEFORE SURGERY. - MAY TAKE HYDROCODONE THE MORNING BEFORE SURGERY.     Use inhalers on the day of surgery and bring to the hospital.  Stop Metformin and Janumet 2 days prior to surgery.  Take 1/2 of usual insulin dose the night before surgery and none on the morning of surgery.  Follow recommendations from Cardiologist, Pulmonologist or PCP regarding stopping Aspirin, Coumadin, Plavix, Eliquis, Pradaxa, or Pletal-LAST DOSE OF PLAVIX 07-06-20. CONTINUE ASPIRIN-DO NOT TAKE AM OF SURGERY  One week prior to surgery: Stop Anti-inflammatories (NSAIDS) such as Advil, Aleve, Ibuprofen, Motrin, Naproxen, Naprosyn and Aspirin based products such as Excedrin, Goodys Powder, BC Powder. Stop ANY OVER THE COUNTER supplements until after surgery. (You may continue taking Tylenol, Vitamin D, Vitamin B, and multivitamin.)  No Alcohol for 24 hours before or after surgery.  No Smoking including e-cigarettes for 24 hours prior to surgery.   No chewable tobacco products for at least 6 hours prior to surgery.  No nicotine patches on the day of surgery.  Do not use any "recreational" drugs for at least a week prior to your surgery.  Please be advised that the combination of cocaine and anesthesia may have negative outcomes, up to and including death. If you test positive for cocaine, your surgery will be cancelled.  On the morning of surgery brush your teeth with toothpaste and water, you may rinse your mouth with mouthwash if you wish. Do not swallow any toothpaste or mouthwash.  Do not wear jewelry, make-up, hairpins, clips or nail polish.  Do not wear lotions, powders, or perfumes.   Do not shave 48 hours prior to surgery.   Contact lenses, hearing aids and dentures may not be worn into surgery.  Do not bring valuables to the hospital. [facility] is not responsible for any missing/lost belongings or valuables.   Use CHG Soap or wipes as directed on instruction sheet.  Total Shoulder Arthroplasty:  use Benzolyl Peroxide 5% Gel as directed on instruction sheet.  Fleets enema or Magnesium Citrate as directed.  Bring your C-PAP to the hospital with you in case you may have to spend the night.   Notify your doctor if there is any change in your medical condition (cold, fever, infection).  Wear comfortable clothing (specific to your surgery type) to the hospital.  Plan for stool softeners for home use; pain medications have a tendency to cause constipation. You can also help prevent constipation by eating foods  high in fiber such as fruits and vegetables and drinking plenty of fluids as your diet allows.  After surgery, you can help prevent lung complications by doing breathing exercises.  Take deep breaths and cough every 1-2 hours. Your doctor may order a device called an Incentive Spirometer to help you take deep breaths. When coughing or sneezing, hold a pillow firmly against your incision with both hands. This is  called "splinting." Doing this helps protect your incision. It also decreases belly discomfort.  If you are being admitted to the hospital overnight, leave your suitcase in the car. After surgery it may be brought to your room.  If you are being discharged the day of surgery, you will not be allowed to drive home. You will need a responsible adult (18 years or older) to drive you home and stay with you that night.   If you are taking public transportation, you will need to have a responsible adult (18 years or older) with you. Please confirm with your physician that it is acceptable to use public transportation.   Please call the Pre-admissions Testing Dept. at [phone] if you have any questions about these instructions.  Visitation Policy:  Patients undergoing a surgery or procedure may have one family member or support person with them as long as that person is not COVID-19 positive or experiencing its symptoms.  That person may remain in the waiting area during the procedure.  Inpatient Visitation Update:   In an effort to ensure the safety of our team members and our patients, we are implementing a change to our visitation policy:  Effective Monday, Aug. 9, at 7 a.m., inpatients will be allowed one support person.  o The support person may change daily.  o The support person must pass our screening, gel in and out, and wear a mask at all times, including in the patient's room.  o Patients must also wear a mask when staff or their support person are in the room.  o Masking is required regardless of vaccination status.  Systemwide, no visitors 17 or younger.

## 2020-07-07 NOTE — Pre-Procedure Instructions (Addendum)
PT WAS INSTRUCTED ON PHONE CALL TODAY TO CONTINUE ASPIRIN (DO NOT TAKE DAY OF SURGERY) AND NOT TO TAKE PLAVIX TODAY OR TOMORROW PER LAURA CMA AT DR [facility]. PT VERBALIZED UNDERSTANDING

## 2020-07-07 NOTE — Pre-Procedure Instructions (Signed)
PT STATES DURING PREOP PHONE CALL THAT SHE HAD NOT RECEIVED HER WOUND VAC SUPPLIES AS OF TODAY. INSTRUCTED PT THAT IF SHE DOES RECEIVE THESE SHE NEEDS TO BRING WOUND VAC SUPPLIES WITH HER TO THE HOSPITAL. PT VERBALIZED SHE WOULD

## 2020-07-08 ENCOUNTER — Encounter: Payer: Self-pay | Admitting: Vascular Surgery

## 2020-07-08 ENCOUNTER — Other Ambulatory Visit: Payer: Self-pay

## 2020-07-08 ENCOUNTER — Ambulatory Visit: Payer: Medicare HMO

## 2020-07-08 ENCOUNTER — Telehealth (INDEPENDENT_AMBULATORY_CARE_PROVIDER_SITE_OTHER): Payer: Self-pay | Admitting: Vascular Surgery

## 2020-07-08 ENCOUNTER — Ambulatory Visit
Admission: RE | Admit: 2020-07-08 | Discharge: 2020-07-08 | Disposition: A | Discharge status: Home / Self Care | Payer: Medicare HMO | Attending: Vascular Surgery | Admitting: Vascular Surgery

## 2020-07-08 ENCOUNTER — Encounter
Admission: RE | Disposition: A | Discharge status: Home / Self Care | Payer: Self-pay | Source: Home / Self Care | Attending: Vascular Surgery

## 2020-07-08 DIAGNOSIS — I509 Heart failure, unspecified: Secondary | ICD-10-CM | POA: Insufficient documentation

## 2020-07-08 DIAGNOSIS — K219 Gastro-esophageal reflux disease without esophagitis: Secondary | ICD-10-CM | POA: Insufficient documentation

## 2020-07-08 DIAGNOSIS — Z91041 Radiographic dye allergy status: Secondary | ICD-10-CM | POA: Insufficient documentation

## 2020-07-08 DIAGNOSIS — X58XXXA Exposure to other specified factors, initial encounter: Secondary | ICD-10-CM | POA: Insufficient documentation

## 2020-07-08 DIAGNOSIS — D649 Anemia, unspecified: Secondary | ICD-10-CM | POA: Insufficient documentation

## 2020-07-08 DIAGNOSIS — M1990 Unspecified osteoarthritis, unspecified site: Secondary | ICD-10-CM | POA: Diagnosis not present

## 2020-07-08 DIAGNOSIS — F17200 Nicotine dependence, unspecified, uncomplicated: Secondary | ICD-10-CM | POA: Diagnosis not present

## 2020-07-08 DIAGNOSIS — T8789 Other complications of amputation stump: Secondary | ICD-10-CM | POA: Diagnosis not present

## 2020-07-08 DIAGNOSIS — T8753 Necrosis of amputation stump, right lower extremity: Secondary | ICD-10-CM | POA: Diagnosis not present

## 2020-07-08 DIAGNOSIS — I739 Peripheral vascular disease, unspecified: Secondary | ICD-10-CM | POA: Diagnosis not present

## 2020-07-08 DIAGNOSIS — Z823 Family history of stroke: Secondary | ICD-10-CM | POA: Insufficient documentation

## 2020-07-08 DIAGNOSIS — I110 Hypertensive heart disease with heart failure: Secondary | ICD-10-CM | POA: Diagnosis not present

## 2020-07-08 DIAGNOSIS — I5022 Chronic systolic (congestive) heart failure: Secondary | ICD-10-CM | POA: Diagnosis not present

## 2020-07-08 DIAGNOSIS — Z8249 Family history of ischemic heart disease and other diseases of the circulatory system: Secondary | ICD-10-CM | POA: Diagnosis not present

## 2020-07-08 DIAGNOSIS — Z885 Allergy status to narcotic agent status: Secondary | ICD-10-CM | POA: Insufficient documentation

## 2020-07-08 DIAGNOSIS — Z87442 Personal history of urinary calculi: Secondary | ICD-10-CM | POA: Diagnosis not present

## 2020-07-08 DIAGNOSIS — M79609 Pain in unspecified limb: Secondary | ICD-10-CM

## 2020-07-08 DIAGNOSIS — I998 Other disorder of circulatory system: Secondary | ICD-10-CM | POA: Insufficient documentation

## 2020-07-08 DIAGNOSIS — I081 Rheumatic disorders of both mitral and tricuspid valves: Secondary | ICD-10-CM | POA: Diagnosis not present

## 2020-07-08 DIAGNOSIS — I96 Gangrene, not elsewhere classified: Secondary | ICD-10-CM | POA: Diagnosis not present

## 2020-07-08 HISTORY — PX: WOUND DEBRIDEMENT: SHX247

## 2020-07-08 HISTORY — PX: APPLICATION OF WOUND VAC: SHX5189

## 2020-07-08 LAB — CBC WITH DIFFERENTIAL/PLATELET
Abs Immature Granulocytes: 0.11 10*3/uL — ABNORMAL HIGH (ref 0.00–0.07)
Basophils Absolute: 0.1 10*3/uL (ref 0.0–0.1)
Basophils Relative: 1 %
Eosinophils Absolute: 0.2 10*3/uL (ref 0.0–0.5)
Eosinophils Relative: 1 %
HCT: 30.1 % — ABNORMAL LOW (ref 36.0–46.0)
Hemoglobin: 9.2 g/dL — ABNORMAL LOW (ref 12.0–15.0)
Immature Granulocytes: 1 %
Lymphocytes Relative: 7 %
Lymphs Abs: 1 10*3/uL (ref 0.7–4.0)
MCH: 24.8 pg — ABNORMAL LOW (ref 26.0–34.0)
MCHC: 30.6 g/dL (ref 30.0–36.0)
MCV: 81.1 fL (ref 80.0–100.0)
Monocytes Absolute: 0.7 10*3/uL (ref 0.1–1.0)
Monocytes Relative: 5 %
Neutro Abs: 11.4 10*3/uL — ABNORMAL HIGH (ref 1.7–7.7)
Neutrophils Relative %: 85 %
Platelets: 463 10*3/uL — ABNORMAL HIGH (ref 150–400)
RBC: 3.71 MIL/uL — ABNORMAL LOW (ref 3.87–5.11)
RDW: 16.7 % — ABNORMAL HIGH (ref 11.5–15.5)
WBC: 13.4 10*3/uL — ABNORMAL HIGH (ref 4.0–10.5)
nRBC: 0 % (ref 0.0–0.2)

## 2020-07-08 LAB — TYPE AND SCREEN
ABO/RH(D): O POS
Antibody Screen: NEGATIVE

## 2020-07-08 LAB — BASIC METABOLIC PANEL
Anion gap: 12 (ref 5–15)
BUN: 17 mg/dL (ref 8–23)
CO2: 21 mmol/L — ABNORMAL LOW (ref 22–32)
Calcium: 9.5 mg/dL (ref 8.9–10.3)
Chloride: 102 mmol/L (ref 98–111)
Creatinine, Ser: 0.7 mg/dL (ref 0.44–1.00)
GFR calc Af Amer: >60 mL/min (ref >60)
GFR calc non Af Amer: >60 mL/min (ref >60)
Glucose, Bld: 98 mg/dL (ref 70–99)
Potassium: 3.5 mmol/L (ref 3.5–5.1)
Sodium: 135 mmol/L (ref 135–145)

## 2020-07-08 LAB — APTT: aPTT: 37 seconds — ABNORMAL HIGH (ref 24–36)

## 2020-07-08 LAB — SARS CORONAVIRUS 2 (TAT 6-24 HRS): SARS Coronavirus 2: NEGATIVE

## 2020-07-08 LAB — PROTIME-INR
INR: 1 (ref 0.8–1.2)
Prothrombin Time: 12.8 seconds (ref 11.4–15.2)

## 2020-07-08 SURGERY — DEBRIDEMENT, WOUND
Anesthesia: General | Laterality: Right

## 2020-07-08 MED ORDER — FENTANYL CITRATE (PF) 100 MCG/2ML IJ SOLN
25.0000 ug | INTRAMUSCULAR | Status: DC | PRN
  Administered 2020-07-08: 25 ug via INTRAVENOUS
  Administered 2020-07-08: 50 ug via INTRAVENOUS

## 2020-07-08 MED ORDER — EPHEDRINE SULFATE 50 MG/ML IJ SOLN
INTRAMUSCULAR | Status: DC | PRN
  Administered 2020-07-08 (×2): 5 mg via INTRAVENOUS

## 2020-07-08 MED ORDER — KETAMINE HCL 50 MG/ML IJ SOLN
INTRAMUSCULAR | Status: AC
  Filled 2020-07-08: qty 10

## 2020-07-08 MED ORDER — KETAMINE HCL 10 MG/ML IJ SOLN
INTRAMUSCULAR | Status: DC | PRN
  Administered 2020-07-08: 20 mg via INTRAVENOUS

## 2020-07-08 MED ORDER — CHLORHEXIDINE GLUCONATE 0.12 % MT SOLN
OROMUCOSAL | Status: AC
  Filled 2020-07-08: qty 15

## 2020-07-08 MED ORDER — LIDOCAINE HCL (PF) 2 % IJ SOLN
INTRAMUSCULAR | Status: AC
  Filled 2020-07-08: qty 5

## 2020-07-08 MED ORDER — FENTANYL CITRATE (PF) 100 MCG/2ML IJ SOLN
INTRAMUSCULAR | Status: AC
  Filled 2020-07-08: qty 2

## 2020-07-08 MED ORDER — FENTANYL CITRATE (PF) 100 MCG/2ML IJ SOLN
INTRAMUSCULAR | Status: DC | PRN
Start: 2020-07-08 — End: 2020-07-08
  Administered 2020-07-08 (×2): 25 ug via INTRAVENOUS
  Administered 2020-07-08: 50 ug via INTRAVENOUS

## 2020-07-08 MED ORDER — HYDROMORPHONE HCL 1 MG/ML IJ SOLN
INTRAMUSCULAR | Status: AC
  Administered 2020-07-08: 0.25 mg via INTRAVENOUS
  Filled 2020-07-08: qty 1

## 2020-07-08 MED ORDER — LIDOCAINE HCL (CARDIAC) PF 100 MG/5ML IV SOSY
PREFILLED_SYRINGE | INTRAVENOUS | Status: DC | PRN
  Administered 2020-07-08: 60 mg via INTRAVENOUS

## 2020-07-08 MED ORDER — CEFAZOLIN SODIUM-DEXTROSE 2-4 GM/100ML-% IV SOLN
INTRAVENOUS | Status: AC
  Filled 2020-07-08: qty 100

## 2020-07-08 MED ORDER — PHENYLEPHRINE HCL (PRESSORS) 10 MG/ML IV SOLN
INTRAVENOUS | Status: AC
  Filled 2020-07-08: qty 1

## 2020-07-08 MED ORDER — HYDROMORPHONE HCL 1 MG/ML IJ SOLN
0.5000 mg | INTRAMUSCULAR | Status: DC | PRN
  Administered 2020-07-08: 0.5 mg via INTRAVENOUS

## 2020-07-08 MED ORDER — ONDANSETRON HCL 4 MG/2ML IJ SOLN: 4.0000 mg | Freq: Once | INTRAMUSCULAR | Status: DC | PRN

## 2020-07-08 MED ORDER — ONDANSETRON HCL 4 MG/2ML IJ SOLN
INTRAMUSCULAR | Status: DC | PRN
  Administered 2020-07-08: 4 mg via INTRAVENOUS

## 2020-07-08 MED ORDER — OXYCODONE HCL 5 MG PO TABS
ORAL_TABLET | ORAL | Status: DC
Start: 2020-07-08 — End: 2020-07-08
  Filled 2020-07-08: qty 1

## 2020-07-08 MED ORDER — CHLORHEXIDINE GLUCONATE 0.12 % MT SOLN
15.0000 mL | Freq: Once | OROMUCOSAL | Status: AC
  Administered 2020-07-08: 15 mL via OROMUCOSAL

## 2020-07-08 MED ORDER — DEXAMETHASONE SODIUM PHOSPHATE 10 MG/ML IJ SOLN
INTRAMUSCULAR | Status: AC
  Filled 2020-07-08: qty 1

## 2020-07-08 MED ORDER — HYDROMORPHONE HCL 1 MG/ML IJ SOLN
0.2500 mg | INTRAMUSCULAR | Status: DC | PRN
  Administered 2020-07-08: 0.25 mg via INTRAVENOUS

## 2020-07-08 MED ORDER — BUPIVACAINE LIPOSOME 1.3 % IJ SUSP
INTRAMUSCULAR | Status: DC | PRN
  Administered 2020-07-08: 50 mL

## 2020-07-08 MED ORDER — HYDROCODONE-ACETAMINOPHEN 5-325 MG PO TABS: 1.0000 | ORAL_TABLET | Freq: Four times a day (QID) | ORAL | 0 refills | Status: DC | PRN

## 2020-07-08 MED ORDER — CHLORHEXIDINE GLUCONATE CLOTH 2 % EX PADS
6.0000 | MEDICATED_PAD | Freq: Once | CUTANEOUS | Status: AC
  Administered 2020-07-08: 6 via TOPICAL

## 2020-07-08 MED ORDER — CEFAZOLIN SODIUM-DEXTROSE 2-4 GM/100ML-% IV SOLN
2.0000 g | INTRAVENOUS | Status: AC
  Administered 2020-07-08: 2 g via INTRAVENOUS

## 2020-07-08 MED ORDER — PROPOFOL 10 MG/ML IV BOLUS
INTRAVENOUS | Status: DC | PRN
  Administered 2020-07-08: 70 mg via INTRAVENOUS

## 2020-07-08 MED ORDER — LACTATED RINGERS IV SOLN: INTRAVENOUS | Status: DC

## 2020-07-08 MED ORDER — OXYCODONE HCL 5 MG PO TABS
5.0000 mg | ORAL_TABLET | Freq: Once | ORAL | Status: AC | PRN
  Administered 2020-07-08: 5 mg via ORAL

## 2020-07-08 MED ORDER — PROPOFOL 10 MG/ML IV BOLUS
INTRAVENOUS | Status: AC
  Filled 2020-07-08: qty 40

## 2020-07-08 MED ORDER — ONDANSETRON HCL 4 MG/2ML IJ SOLN
INTRAMUSCULAR | Status: AC
  Filled 2020-07-08: qty 2

## 2020-07-08 MED ORDER — BUPIVACAINE HCL (PF) 0.5 % IJ SOLN
INTRAMUSCULAR | Status: AC
  Filled 2020-07-08: qty 30

## 2020-07-08 MED ORDER — FENTANYL CITRATE (PF) 100 MCG/2ML IJ SOLN
INTRAMUSCULAR | Status: AC
  Administered 2020-07-08: 25 ug via INTRAVENOUS
  Filled 2020-07-08: qty 2

## 2020-07-08 MED ORDER — PHENYLEPHRINE HCL (PRESSORS) 10 MG/ML IV SOLN
INTRAVENOUS | Status: DC | PRN
  Administered 2020-07-08 (×2): 100 ug via INTRAVENOUS

## 2020-07-08 MED ORDER — DEXAMETHASONE SODIUM PHOSPHATE 10 MG/ML IJ SOLN
INTRAMUSCULAR | Status: DC | PRN
  Administered 2020-07-08: 10 mg via INTRAVENOUS

## 2020-07-08 MED ORDER — OXYCODONE HCL 5 MG/5ML PO SOLN: 5.0000 mg | Freq: Once | ORAL | Status: AC | PRN

## 2020-07-08 MED ORDER — ORAL CARE MOUTH RINSE: 15.0000 mL | Freq: Once | OROMUCOSAL | Status: AC

## 2020-07-08 MED ORDER — BUPIVACAINE LIPOSOME 1.3 % IJ SUSP
INTRAMUSCULAR | Status: AC
  Filled 2020-07-08: qty 20

## 2020-07-08 SURGICAL SUPPLY — 26 items
APL PRP STRL LF DISP 70% ISPRP (MISCELLANEOUS) ×1
BNDG COHESIVE 6X5 TAN STRL LF (GAUZE/BANDAGES/DRESSINGS) ×2 IMPLANT
BNDG CONFORM 2 STRL LF (GAUZE/BANDAGES/DRESSINGS) IMPLANT
CANISTER SUCT 1200ML W/VALVE (MISCELLANEOUS) ×2 IMPLANT
CANISTER WOUND CARE 500ML ATS (WOUND CARE) IMPLANT
CHLORAPREP W/TINT 26 (MISCELLANEOUS) ×2 IMPLANT
COVER WAND RF STERILE (DRAPES) ×2 IMPLANT
DRAPE INCISE IOBAN 66X45 STRL (DRAPES) ×4 IMPLANT
DRSG EMULSION OIL 3X3 NADH (GAUZE/BANDAGES/DRESSINGS) IMPLANT
DRSG VAC ATS MED SENSATRAC (GAUZE/BANDAGES/DRESSINGS) IMPLANT
ELECT REM PT RETURN 9FT ADLT (ELECTROSURGICAL) ×2
ELECTRODE REM PT RTRN 9FT ADLT (ELECTROSURGICAL) ×1 IMPLANT
GAUZE SPONGE 4X4 12PLY STRL (GAUZE/BANDAGES/DRESSINGS) IMPLANT
GLOVE SURG SYN 8.0 (GLOVE) ×2 IMPLANT
GOWN STRL REUS W/ TWL LRG LVL3 (GOWN DISPOSABLE) ×1 IMPLANT
GOWN STRL REUS W/ TWL XL LVL3 (GOWN DISPOSABLE) ×1 IMPLANT
GOWN STRL REUS W/TWL LRG LVL3 (GOWN DISPOSABLE) ×2
GOWN STRL REUS W/TWL XL LVL3 (GOWN DISPOSABLE) ×2
KIT TURNOVER KIT A (KITS) ×2 IMPLANT
LABEL OR SOLS (LABEL) ×2 IMPLANT
NS IRRIG 500ML POUR BTL (IV SOLUTION) ×2 IMPLANT
PACK EXTREMITY (MISCELLANEOUS) ×2 IMPLANT
PAD PREP 24X41 OB/GYN DISP (PERSONAL CARE ITEMS) ×2 IMPLANT
SOL PREP PVP 2OZ (MISCELLANEOUS)
SOLUTION PREP PVP 2OZ (MISCELLANEOUS) IMPLANT
STOCKINETTE IMPERV 14X48 (MISCELLANEOUS) IMPLANT

## 2020-07-08 NOTE — Op Note (Signed)
    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 Raul Del  ANESTHESIA: general  ESTIMATED BLOOD LOSS: Less than 10 cc  FINDING(S): Ischemic skin and subcutaneous tissues  SPECIMEN(S): Debrided tissue to pathology  INDICATIONS:   [name] is a 74 y.o. female who presents with worsening pain and ischemic changes to the skin and subcutaneous tissues of the right above-knee amputation stump.  I am concerned there could be infection underneath or that a previous stent could be the nidus for this change.  Given these facts and the degree of pain she is having at the site I have recommended debridement.  Risks and benefits were reviewed all questions were answered patient has agreed to proceed  DESCRIPTION: After full informed written consent was obtained from the patient, the patient was brought back to the operating room and placed supine upon the operating table.  Prior to induction, the patient received IV antibiotics.   After obtaining adequate anesthesia, the patient was then prepped and draped in the standard fashion for a debridement of the right above-knee amputation stump.  Using a 10 blade scalpel a circular incision was created around the eschar.  Bleeding from the skin edges noted to be present but poor.  The incision is then carried down through the subcutaneous fat to expose fascia.  The ischemic tissue was then removed as a single piece en bloc shaped like a puck.  Measurements of the wound were 9 cm x 7 cm.  This represents 63 cm.  Once bleeding was controlled Bovie cautery Exparel with half percent Marcaine was infiltrated into the bed of the wound and all along the skin edge.  VAC dressing was applied.  No purulence was encountered.  I do  not see any exposed foreign bodies such as stents or previous bypass graft segments.  The fascia in fact appears to be intact and healthy.   The patient tolerated this procedure well.   COMPLICATIONS: None  CONDITION: [name]  Office: [phone]   07/08/2020, 8:27 AM

## 2020-07-08 NOTE — Anesthesia Preprocedure Evaluation (Addendum)
Anesthesia Evaluation  Patient identified by MRN, date of birth, ID band Patient awake    Reviewed: Allergy & Precautions, H&P , NPO status , Patient's Chart, lab work & pertinent test results  History of Anesthesia Complications Negative for: history of anesthetic complications  Airway Mallampati: II  TM Distance: >3 FB     Dental  (+) Upper Dentures, Edentulous Lower   Pulmonary neg sleep apnea, neg COPD, Current SmokerPatient did not abstain from smoking.,     + decreased breath sounds      Cardiovascular hypertension, (-) angina+ Peripheral Vascular Disease and +CHF  (-) Past MI and (-) Cardiac Stents (-) dysrhythmias  Rhythm:regular Rate:Normal  Most recent echo in 2016: - Left ventricle: The cavity size was normal. Systolic function was  mildly to moderately reduced. The estimated ejection fraction was  in the range of 40% to 45%. Hypokinesis of the anterior  myocardium. Hypokinesis of the anteroseptal myocardium.  Hypokinesis of the apical myocardium. Left ventricular diastolic  function parameters were normal.  - Mitral valve: There was mild regurgitation.  - Right ventricle: Systolic function was normal.  - Tricuspid valve: There was moderate regurgitation.  - Pulmonary arteries: Systolic pressure was severely elevated. PA  peak pressure: 66 mm Hg (S).    Neuro/Psych negative neurological ROS  negative psych ROS   GI/Hepatic Neg liver ROS, GERD  ,  Endo/Other  negative endocrine ROS  Renal/GU      Musculoskeletal   Abdominal   Peds  Hematology  (+) Blood dyscrasia, anemia , Hgb 9.2   Anesthesia Other Findings PVD s/p bilateral AKA's, now with infected stump, presenting for wound vac placement  Past Medical History: No date: Arthritis     Comment:  right  and left hip No date: Dysrhythmia No date: GERD (gastroesophageal reflux disease) No date: History of hiatal hernia     Comment:  pt  told 35 yrs ago, describes as small., causing acid               reflux No date: History of kidney stones     Comment:  kidney stone resolved No date: Hypertension No date: Peripheral vascular disease (HCC) 07/14/15: Pneumonia No date: Tobacco abuse No date: Unilateral small kidney     Comment:  a. one functional kidney  Past Surgical History: 01/2015: ABOVE KNEE LEG AMPUTATION; Right 07/16/2015: AMPUTATION; Left     Comment:  Procedure: AMPUTATION ABOVE KNEE;  Surgeon: Renford Dills, MD;  Location: [facility] ORS;  Service: Vascular;                Laterality: Left; 07/23/2015: AMPUTATION; Left     Comment:  Procedure:   WOUND CLOSURE;  Surgeon: Renford Dills,              MD;  Location: [facility];  Service: Vascular;  Laterality:              Left; 11/19/2015: AMPUTATION; Left     Comment:  Procedure:  ( REVISION ) AMPUTATION ABOVE KNEE ;                Surgeon: Renford Dills, MD;  Location: [facility];                Service: Vascular;  Laterality: Left; 1969: APPENDECTOMY 1970: BREAST BIOPSY; Right     Comment:  neg 1969: ECTOPIC PREGNANCY SURGERY; Right 07/28/2016: FOREIGN BODY  REMOVAL; Left     Comment:  Procedure: FOREIGN BODY REMOVAL ADULT ( LEG );  Surgeon:              Renford Dills, MD;  Location: [facility];  Service:               Vascular;  Laterality: Left; 07/22/2015: PERIPHERAL VASCULAR CATHETERIZATION; N/A     Comment:  Procedure: Abdominal Aortogram w/Lower Extremity;                Surgeon: Annice Needy, MD;  Location: [facility]               [facility];  Service: Cardiovascular;  Laterality: N/A; 07/22/2015: PERIPHERAL VASCULAR CATHETERIZATION     Comment:  Procedure: Lower Extremity Intervention;  Surgeon: Annice Needy, MD;  Location: [facility];  Service:               Cardiovascular;; No date: stents left leg 03/02/2020: VISCERAL ANGIOGRAPHY; N/A     Comment:  Procedure: VISCERAL ANGIOGRAPHY;  Surgeon: Annice Needy,               MD;  Location: [facility];  Service:               Cardiovascular;  Laterality: N/A;     Reproductive/Obstetrics negative OB ROS                           Anesthesia Physical Anesthesia Plan  ASA: III  Anesthesia Plan: General LMA   Post-op Pain Management:    Induction:   PONV Risk Score and Plan: Dexamethasone, Ondansetron and Treatment may vary due to age or medical condition  Airway Management Planned:   Additional Equipment:   Intra-op Plan:   Post-operative Plan:   Informed Consent: I have reviewed the patients History and Physical, chart, labs and discussed the procedure including the risks, benefits and alternatives for the proposed anesthesia with the patient or authorized representative who has indicated his/her understanding and acceptance.     Dental Advisory Given  Plan Discussed with: Anesthesiologist, CRNA and Surgeon  Anesthesia Plan Comments:        Anesthesia Quick Evaluation

## 2020-07-08 NOTE — Discharge Instructions (Signed)

## 2020-07-08 NOTE — Telephone Encounter (Signed)
Daughter called stating that GS requested her to call with this information.  Hydrocodone 5-325mg  Take 1-2 tablets every 6 hrs twice a day (as needed) She has 44 pills. This note is for documentation purposes only.

## 2020-07-08 NOTE — Transfer of Care (Signed)
Immediate Anesthesia Transfer of Care Note  Patient: MUSKAAN SMET  Procedure(s) Performed: DEBRIDEMENT WOUND (Right ) APPLICATION OF WOUND VAC (Right )  Patient Location: PACU  Anesthesia Type:General  Level of Consciousness: drowsy  Airway & Oxygen Therapy: Patient Spontanous Breathing and Patient connected to face mask oxygen  Post-op Assessment: Report given to RN and Post -op Vital signs reviewed and stable  Post vital signs: Reviewed and stable  Last Vitals:  Vitals Value Taken Time  BP 111/54 07/08/20 0837  Temp 36.9 C 07/08/20 0837  Pulse 98 07/08/20 0840  Resp 9 07/08/20 0840  SpO2 100 % 07/08/20 0840  Vitals shown include unvalidated device data.  Last Pain:  Vitals:   07/08/20 0837  TempSrc:   PainSc: Asleep         Complications: No complications documented.

## 2020-07-08 NOTE — Interval H&P Note (Signed)
History and Physical Interval Note:  07/08/2020 7:27 AM  [name]  has presented today for surgery, with the diagnosis of GANGRENE RIGHT AKA OF STUMP.  The various methods of treatment have been discussed with the patient and family. After consideration of risks, benefits and other options for treatment, the patient has consented to  Procedure(s): DEBRIDEMENT WOUND (Right) APPLICATION OF WOUND VAC (Right) as a surgical intervention.  The patient's history has been reviewed, patient examined, no change in status, stable for surgery.  I have reviewed the patient's chart and labs.  Questions were answered to the patient's satisfaction.     Levora Dredge

## 2020-07-08 NOTE — Anesthesia Procedure Notes (Addendum)
Procedure Name: LMA Insertion Date/Time: 07/08/2020 7:44 AM Performed by: Felix Ahmadi, RN Pre-anesthesia Checklist: Patient identified, Emergency Drugs available, Suction available, Patient being monitored and Timeout performed Patient Re-evaluated:Patient Re-evaluated prior to induction Oxygen Delivery Method: Circle system utilized Preoxygenation: Pre-oxygenation with 100% oxygen Induction Type: IV induction Ventilation: Mask ventilation without difficulty LMA: LMA inserted LMA Size: 3.0 Number of attempts: 1 Placement Confirmation: positive ETCO2 Tube secured with: Tape Dental Injury: Teeth and Oropharynx as per pre-operative assessment

## 2020-07-09 ENCOUNTER — Encounter: Payer: Self-pay | Admitting: Vascular Surgery

## 2020-07-09 ENCOUNTER — Ambulatory Visit (INDEPENDENT_AMBULATORY_CARE_PROVIDER_SITE_OTHER): Payer: Medicare HMO | Admitting: Nurse Practitioner

## 2020-07-09 ENCOUNTER — Telehealth (INDEPENDENT_AMBULATORY_CARE_PROVIDER_SITE_OTHER): Payer: Self-pay

## 2020-07-09 VITALS — BP 167/93 | HR 111 | Resp 16

## 2020-07-09 DIAGNOSIS — T879 Unspecified complications of amputation stump: Secondary | ICD-10-CM | POA: Diagnosis not present

## 2020-07-09 DIAGNOSIS — I7025 Atherosclerosis of native arteries of other extremities with ulceration: Secondary | ICD-10-CM | POA: Diagnosis not present

## 2020-07-09 LAB — SURGICAL PATHOLOGY

## 2020-07-09 NOTE — Telephone Encounter (Signed)
Patient daughter called stating that her mother is bleeding through the wound vac. The patient bleeding started yesterday and has snot stop. The patient has filled up 4 bags and feels light headed. I spoke with Sheppard Plumber NP and she advise for the patient to be evaluated at the ER. Patient daughter has being made aware with medical advice.

## 2020-07-09 NOTE — Progress Notes (Signed)
Patient came in for wound vac change due to patient concern with bleeding in container. Patient informed that container was full and they had just change it before coming in.  After vac was replaced the wound vac container read full container but nothing was in it and the company was contacted. The company will be replacing wound vac machine.

## 2020-07-09 NOTE — Anesthesia Postprocedure Evaluation (Signed)
Anesthesia Post Note  Patient: [name]  Procedure(s) Performed: DEBRIDEMENT WOUND (Right ) APPLICATION OF WOUND VAC (Right )  Patient location during evaluation: PACU Anesthesia Type: General Level of consciousness: awake and alert Pain management: pain level controlled Vital Signs Assessment: post-procedure vital signs reviewed and stable Respiratory status: spontaneous breathing, nonlabored ventilation and respiratory function stable Cardiovascular status: blood pressure returned to baseline and stable Postop Assessment: no apparent nausea or vomiting Anesthetic complications: no   No complications documented.   Last Vitals:  Vitals:   07/08/20 0957 07/08/20 1024  BP: (!) 135/56 136/64  Pulse: 97 (!) 102  Resp: 16 16  Temp: 36.9 C 37.1 C  SpO2: 93% 95%    Last Pain:  Vitals:   07/08/20 1024  TempSrc: Temporal  PainSc: 0-No pain                 Aurelio Brash Chrysta Fulcher

## 2020-07-12 ENCOUNTER — Encounter (INDEPENDENT_AMBULATORY_CARE_PROVIDER_SITE_OTHER): Payer: Self-pay | Admitting: Nurse Practitioner

## 2020-07-12 ENCOUNTER — Telehealth (INDEPENDENT_AMBULATORY_CARE_PROVIDER_SITE_OTHER): Payer: Self-pay

## 2020-07-12 ENCOUNTER — Other Ambulatory Visit (INDEPENDENT_AMBULATORY_CARE_PROVIDER_SITE_OTHER): Payer: Self-pay | Admitting: Vascular Surgery

## 2020-07-12 DIAGNOSIS — T879 Unspecified complications of amputation stump: Secondary | ICD-10-CM

## 2020-07-12 DIAGNOSIS — I739 Peripheral vascular disease, unspecified: Secondary | ICD-10-CM | POA: Diagnosis not present

## 2020-07-12 DIAGNOSIS — T8753 Necrosis of amputation stump, right lower extremity: Secondary | ICD-10-CM | POA: Diagnosis not present

## 2020-07-12 DIAGNOSIS — I110 Hypertensive heart disease with heart failure: Secondary | ICD-10-CM | POA: Diagnosis not present

## 2020-07-12 DIAGNOSIS — Z89611 Acquired absence of right leg above knee: Secondary | ICD-10-CM | POA: Diagnosis not present

## 2020-07-12 DIAGNOSIS — Z993 Dependence on wheelchair: Secondary | ICD-10-CM | POA: Diagnosis not present

## 2020-07-12 DIAGNOSIS — R69 Illness, unspecified: Secondary | ICD-10-CM | POA: Diagnosis not present

## 2020-07-12 DIAGNOSIS — Z9181 History of falling: Secondary | ICD-10-CM | POA: Diagnosis not present

## 2020-07-12 DIAGNOSIS — T8789 Other complications of amputation stump: Secondary | ICD-10-CM | POA: Diagnosis not present

## 2020-07-12 DIAGNOSIS — I509 Heart failure, unspecified: Secondary | ICD-10-CM | POA: Diagnosis not present

## 2020-07-12 DIAGNOSIS — I998 Other disorder of circulatory system: Secondary | ICD-10-CM | POA: Diagnosis not present

## 2020-07-12 NOTE — Telephone Encounter (Signed)
Patient was giving medical advice to be evaluated at the hospital but patient daughter reach out to vascular surgeon on call and was advise to come into the office for vac change. Patient informed that container was full and they had just change it before coming in.  After vac was replaced the wound vac container read full container but nothing was in it and the company was contacted. The company will be replacing wound vac machine.

## 2020-07-12 NOTE — Telephone Encounter (Signed)
[name], [place] Phone Number: [phone]  [facility] is not able to see this patient due to complexity of wound care. Daughter and husband also refused services from [facility] on 9/17 as well. [facility] has tried to see this patient in the past as well and they also refused home health services.      I received this message from [place] [facility] regarding the patient. I have attempted to contact the patient to have them contact their insurance to find out what home health they work with and was unable to leave a message due to the phone not taking messages.

## 2020-07-12 NOTE — Telephone Encounter (Signed)
Pt called the nurses line and wanted to know  Was home care going to come out to her home I spoke with [facility] and confirmed that they are and I called the pt back and made her aware that she would be receiving  Services to and on mondays and wendsday.

## 2020-07-13 ENCOUNTER — Telehealth (INDEPENDENT_AMBULATORY_CARE_PROVIDER_SITE_OTHER): Payer: Self-pay

## 2020-07-13 NOTE — Telephone Encounter (Signed)
Home health nurse has being made aware that orders are fine and requested medical history has being giving

## 2020-07-13 NOTE — Telephone Encounter (Signed)
That's fine

## 2020-07-15 ENCOUNTER — Telehealth (INDEPENDENT_AMBULATORY_CARE_PROVIDER_SITE_OTHER): Payer: Self-pay

## 2020-07-15 DIAGNOSIS — I998 Other disorder of circulatory system: Secondary | ICD-10-CM | POA: Diagnosis not present

## 2020-07-15 DIAGNOSIS — Z9181 History of falling: Secondary | ICD-10-CM | POA: Diagnosis not present

## 2020-07-15 DIAGNOSIS — Z993 Dependence on wheelchair: Secondary | ICD-10-CM | POA: Diagnosis not present

## 2020-07-15 DIAGNOSIS — T8753 Necrosis of amputation stump, right lower extremity: Secondary | ICD-10-CM | POA: Diagnosis not present

## 2020-07-15 DIAGNOSIS — R69 Illness, unspecified: Secondary | ICD-10-CM | POA: Diagnosis not present

## 2020-07-15 DIAGNOSIS — T8789 Other complications of amputation stump: Secondary | ICD-10-CM | POA: Diagnosis not present

## 2020-07-15 DIAGNOSIS — I739 Peripheral vascular disease, unspecified: Secondary | ICD-10-CM | POA: Diagnosis not present

## 2020-07-15 DIAGNOSIS — Z89611 Acquired absence of right leg above knee: Secondary | ICD-10-CM | POA: Diagnosis not present

## 2020-07-15 DIAGNOSIS — I509 Heart failure, unspecified: Secondary | ICD-10-CM | POA: Diagnosis not present

## 2020-07-15 DIAGNOSIS — I110 Hypertensive heart disease with heart failure: Secondary | ICD-10-CM | POA: Diagnosis not present

## 2020-07-15 NOTE — Telephone Encounter (Signed)
Patient left a voicemail requesting a refill for pain medication. I spoke with Dr Gilda Crease and will be refilling medication as requested. Patient has being made aware the refill will be sent to pharmacy

## 2020-07-19 ENCOUNTER — Other Ambulatory Visit (INDEPENDENT_AMBULATORY_CARE_PROVIDER_SITE_OTHER): Payer: Self-pay | Admitting: Vascular Surgery

## 2020-07-19 ENCOUNTER — Telehealth (INDEPENDENT_AMBULATORY_CARE_PROVIDER_SITE_OTHER): Payer: Self-pay

## 2020-07-19 DIAGNOSIS — I70263 Atherosclerosis of native arteries of extremities with gangrene, bilateral legs: Secondary | ICD-10-CM

## 2020-07-19 DIAGNOSIS — I110 Hypertensive heart disease with heart failure: Secondary | ICD-10-CM | POA: Diagnosis not present

## 2020-07-19 DIAGNOSIS — R69 Illness, unspecified: Secondary | ICD-10-CM | POA: Diagnosis not present

## 2020-07-19 DIAGNOSIS — Z89611 Acquired absence of right leg above knee: Secondary | ICD-10-CM | POA: Diagnosis not present

## 2020-07-19 DIAGNOSIS — Z9181 History of falling: Secondary | ICD-10-CM | POA: Diagnosis not present

## 2020-07-19 DIAGNOSIS — T8753 Necrosis of amputation stump, right lower extremity: Secondary | ICD-10-CM | POA: Diagnosis not present

## 2020-07-19 DIAGNOSIS — T8789 Other complications of amputation stump: Secondary | ICD-10-CM | POA: Diagnosis not present

## 2020-07-19 DIAGNOSIS — Z993 Dependence on wheelchair: Secondary | ICD-10-CM | POA: Diagnosis not present

## 2020-07-19 DIAGNOSIS — I998 Other disorder of circulatory system: Secondary | ICD-10-CM | POA: Diagnosis not present

## 2020-07-19 DIAGNOSIS — I739 Peripheral vascular disease, unspecified: Secondary | ICD-10-CM | POA: Diagnosis not present

## 2020-07-19 DIAGNOSIS — T879 Unspecified complications of amputation stump: Secondary | ICD-10-CM

## 2020-07-19 DIAGNOSIS — I509 Heart failure, unspecified: Secondary | ICD-10-CM | POA: Diagnosis not present

## 2020-07-19 MED ORDER — HYDROMORPHONE HCL 2 MG PO TABS: 1.0000 mg | ORAL_TABLET | Freq: Three times a day (TID) | ORAL | 0 refills | Status: DC | PRN

## 2020-07-19 NOTE — Progress Notes (Signed)
I was contacted by the patient's daughter.  The oxycodone is not controlling her ischemic pain.  This is a very complicated scenario as the patient is already status post bilateral above-knee amputations.  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  There is absolutely no possibility for arterial reconstruction to eliminate the ischemic rest pain.  There is no possibility for a higher amputation or even a hip disarticulation to eliminate the ischemic pain.  I believe we are transitioning to an end-of-life situation and reasonable pain control is going to become a significant problem.  I have little hope that the area that I have debrided will heal.  At this time I prescribe Dilaudid since morphine causes her to have hyperemesis.  I will also have the patient and hopefully the daughter back into the office with the hope of involving hospice and or palliative care so that we can manage her pain more appropriately

## 2020-07-20 NOTE — Telephone Encounter (Signed)
Patient left voicemail requesting for a prescription for Dilaudid to help with severe pain relief. Dr Gilda Crease was made aware and prescription was sent into pharmacy.

## 2020-07-22 ENCOUNTER — Telehealth (INDEPENDENT_AMBULATORY_CARE_PROVIDER_SITE_OTHER): Payer: Self-pay

## 2020-07-22 ENCOUNTER — Ambulatory Visit (INDEPENDENT_AMBULATORY_CARE_PROVIDER_SITE_OTHER): Payer: Medicare HMO | Admitting: Vascular Surgery

## 2020-07-22 NOTE — Telephone Encounter (Signed)
The pts daughter called and left a message on the nurses line and wanted to know could she get a Rx for dilaudid per her las conversation with Dr. Wyn Quaker per her chart and Dr. Wyn Quaker the Rx was sent to her pharmacy on 10/4 I called the pt  And made her aware I also called the pharmacy so they could call the pt and make her aware when her Rx is ready.

## 2020-07-23 DIAGNOSIS — I998 Other disorder of circulatory system: Secondary | ICD-10-CM | POA: Diagnosis not present

## 2020-07-23 DIAGNOSIS — Z993 Dependence on wheelchair: Secondary | ICD-10-CM | POA: Diagnosis not present

## 2020-07-23 DIAGNOSIS — Z9181 History of falling: Secondary | ICD-10-CM | POA: Diagnosis not present

## 2020-07-23 DIAGNOSIS — R69 Illness, unspecified: Secondary | ICD-10-CM | POA: Diagnosis not present

## 2020-07-23 DIAGNOSIS — I110 Hypertensive heart disease with heart failure: Secondary | ICD-10-CM | POA: Diagnosis not present

## 2020-07-23 DIAGNOSIS — I509 Heart failure, unspecified: Secondary | ICD-10-CM | POA: Diagnosis not present

## 2020-07-23 DIAGNOSIS — T8753 Necrosis of amputation stump, right lower extremity: Secondary | ICD-10-CM | POA: Diagnosis not present

## 2020-07-23 DIAGNOSIS — Z89611 Acquired absence of right leg above knee: Secondary | ICD-10-CM | POA: Diagnosis not present

## 2020-07-23 DIAGNOSIS — T8789 Other complications of amputation stump: Secondary | ICD-10-CM | POA: Diagnosis not present

## 2020-07-23 DIAGNOSIS — I739 Peripheral vascular disease, unspecified: Secondary | ICD-10-CM | POA: Diagnosis not present

## 2020-07-26 ENCOUNTER — Ambulatory Visit (INDEPENDENT_AMBULATORY_CARE_PROVIDER_SITE_OTHER): Payer: Medicare HMO | Admitting: Vascular Surgery

## 2020-07-26 ENCOUNTER — Encounter (INDEPENDENT_AMBULATORY_CARE_PROVIDER_SITE_OTHER): Payer: Self-pay | Admitting: Vascular Surgery

## 2020-07-26 ENCOUNTER — Other Ambulatory Visit: Payer: Self-pay

## 2020-07-26 VITALS — BP 114/62 | HR 105

## 2020-07-26 DIAGNOSIS — I70263 Atherosclerosis of native arteries of extremities with gangrene, bilateral legs: Secondary | ICD-10-CM

## 2020-07-27 ENCOUNTER — Other Ambulatory Visit (INDEPENDENT_AMBULATORY_CARE_PROVIDER_SITE_OTHER): Payer: Self-pay | Admitting: Vascular Surgery

## 2020-07-27 ENCOUNTER — Other Ambulatory Visit (INDEPENDENT_AMBULATORY_CARE_PROVIDER_SITE_OTHER): Payer: Self-pay | Admitting: Nurse Practitioner

## 2020-07-27 DIAGNOSIS — M79604 Pain in right leg: Secondary | ICD-10-CM

## 2020-07-27 DIAGNOSIS — I739 Peripheral vascular disease, unspecified: Secondary | ICD-10-CM

## 2020-07-27 DIAGNOSIS — T8789 Other complications of amputation stump: Secondary | ICD-10-CM

## 2020-07-27 DIAGNOSIS — T879 Unspecified complications of amputation stump: Secondary | ICD-10-CM

## 2020-07-28 ENCOUNTER — Telehealth (INDEPENDENT_AMBULATORY_CARE_PROVIDER_SITE_OTHER): Payer: Self-pay

## 2020-07-28 NOTE — Telephone Encounter (Signed)
[employer] [facility] changed every other day

## 2020-07-28 NOTE — Telephone Encounter (Signed)
I called and made [name] from [facility] aware of the NP's instructions.

## 2020-07-28 NOTE — Telephone Encounter (Signed)
Keym[facility] from [facility] called and left a message on the nurses line wanting to know what are the new wound care orders for the pt now that she has her Vac off. Please advise.

## 2020-07-29 ENCOUNTER — Ambulatory Visit (INDEPENDENT_AMBULATORY_CARE_PROVIDER_SITE_OTHER): Payer: Medicare HMO | Admitting: Vascular Surgery

## 2020-07-29 ENCOUNTER — Encounter (INDEPENDENT_AMBULATORY_CARE_PROVIDER_SITE_OTHER): Payer: Medicare HMO

## 2020-07-29 DIAGNOSIS — R69 Illness, unspecified: Secondary | ICD-10-CM | POA: Diagnosis not present

## 2020-07-29 DIAGNOSIS — I509 Heart failure, unspecified: Secondary | ICD-10-CM | POA: Diagnosis not present

## 2020-07-29 DIAGNOSIS — T8753 Necrosis of amputation stump, right lower extremity: Secondary | ICD-10-CM | POA: Diagnosis not present

## 2020-07-29 DIAGNOSIS — Z9181 History of falling: Secondary | ICD-10-CM | POA: Diagnosis not present

## 2020-07-29 DIAGNOSIS — I739 Peripheral vascular disease, unspecified: Secondary | ICD-10-CM | POA: Diagnosis not present

## 2020-07-29 DIAGNOSIS — I998 Other disorder of circulatory system: Secondary | ICD-10-CM | POA: Diagnosis not present

## 2020-07-29 DIAGNOSIS — Z993 Dependence on wheelchair: Secondary | ICD-10-CM | POA: Diagnosis not present

## 2020-07-29 DIAGNOSIS — I110 Hypertensive heart disease with heart failure: Secondary | ICD-10-CM | POA: Diagnosis not present

## 2020-07-29 DIAGNOSIS — Z89611 Acquired absence of right leg above knee: Secondary | ICD-10-CM | POA: Diagnosis not present

## 2020-07-29 DIAGNOSIS — T8789 Other complications of amputation stump: Secondary | ICD-10-CM | POA: Diagnosis not present

## 2020-08-02 DIAGNOSIS — Z993 Dependence on wheelchair: Secondary | ICD-10-CM | POA: Diagnosis not present

## 2020-08-02 DIAGNOSIS — I998 Other disorder of circulatory system: Secondary | ICD-10-CM | POA: Diagnosis not present

## 2020-08-02 DIAGNOSIS — Z9181 History of falling: Secondary | ICD-10-CM | POA: Diagnosis not present

## 2020-08-02 DIAGNOSIS — Z89511 Acquired absence of right leg below knee: Secondary | ICD-10-CM | POA: Diagnosis not present

## 2020-08-02 DIAGNOSIS — T8753 Necrosis of amputation stump, right lower extremity: Secondary | ICD-10-CM | POA: Diagnosis not present

## 2020-08-02 DIAGNOSIS — R69 Illness, unspecified: Secondary | ICD-10-CM | POA: Diagnosis not present

## 2020-08-02 DIAGNOSIS — I509 Heart failure, unspecified: Secondary | ICD-10-CM | POA: Diagnosis not present

## 2020-08-02 DIAGNOSIS — Z89611 Acquired absence of right leg above knee: Secondary | ICD-10-CM | POA: Diagnosis not present

## 2020-08-02 DIAGNOSIS — I739 Peripheral vascular disease, unspecified: Secondary | ICD-10-CM | POA: Diagnosis not present

## 2020-08-02 DIAGNOSIS — T8789 Other complications of amputation stump: Secondary | ICD-10-CM | POA: Diagnosis not present

## 2020-08-02 DIAGNOSIS — I110 Hypertensive heart disease with heart failure: Secondary | ICD-10-CM | POA: Diagnosis not present

## 2020-08-03 ENCOUNTER — Telehealth: Payer: Self-pay | Admitting: Adult Health Nurse Practitioner

## 2020-08-03 ENCOUNTER — Other Ambulatory Visit: Payer: Self-pay | Admitting: Nurse Practitioner

## 2020-08-03 DIAGNOSIS — I7090 Unspecified atherosclerosis: Secondary | ICD-10-CM

## 2020-08-03 DIAGNOSIS — L03115 Cellulitis of right lower limb: Secondary | ICD-10-CM

## 2020-08-03 MED ORDER — CEPHALEXIN 500 MG PO CAPS: 500.0000 mg | ORAL_CAPSULE | Freq: Three times a day (TID) | ORAL | 0 refills | Status: DC

## 2020-08-03 NOTE — Telephone Encounter (Signed)
Spoke with patient's daughter, Concepcion Elk, regarding Palliative referral/services and all questions were answered and she was in agreement with scheduling visit with NP.  I have scheduled an In-person Consult for 08/12/20 @ 3:30 PM

## 2020-08-05 ENCOUNTER — Telehealth (INDEPENDENT_AMBULATORY_CARE_PROVIDER_SITE_OTHER): Payer: Self-pay

## 2020-08-05 DIAGNOSIS — T8753 Necrosis of amputation stump, right lower extremity: Secondary | ICD-10-CM | POA: Diagnosis not present

## 2020-08-05 DIAGNOSIS — I998 Other disorder of circulatory system: Secondary | ICD-10-CM | POA: Diagnosis not present

## 2020-08-05 DIAGNOSIS — I739 Peripheral vascular disease, unspecified: Secondary | ICD-10-CM | POA: Diagnosis not present

## 2020-08-05 DIAGNOSIS — Z9181 History of falling: Secondary | ICD-10-CM | POA: Diagnosis not present

## 2020-08-05 DIAGNOSIS — T8789 Other complications of amputation stump: Secondary | ICD-10-CM | POA: Diagnosis not present

## 2020-08-05 DIAGNOSIS — R69 Illness, unspecified: Secondary | ICD-10-CM | POA: Diagnosis not present

## 2020-08-05 DIAGNOSIS — I509 Heart failure, unspecified: Secondary | ICD-10-CM | POA: Diagnosis not present

## 2020-08-05 DIAGNOSIS — Z89611 Acquired absence of right leg above knee: Secondary | ICD-10-CM | POA: Diagnosis not present

## 2020-08-05 DIAGNOSIS — Z993 Dependence on wheelchair: Secondary | ICD-10-CM | POA: Diagnosis not present

## 2020-08-05 DIAGNOSIS — I110 Hypertensive heart disease with heart failure: Secondary | ICD-10-CM | POA: Diagnosis not present

## 2020-08-05 NOTE — Telephone Encounter (Signed)
Meek[facility] from [facility] called and left a VM on the nurses line making our office aware that the pt has escar and odor. And puss  coming from her wound  On her right stump  She would like to know care what are out plans for the pts as far as her care  For this issue.

## 2020-08-06 DIAGNOSIS — Z89511 Acquired absence of right leg below knee: Secondary | ICD-10-CM | POA: Diagnosis not present

## 2020-08-06 NOTE — Telephone Encounter (Signed)
This is a very complicated scenario as the patient is already status post bilateral above-knee amputations.  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  There is absolutely no possibility for arterial reconstruction to allow for wound healing.  We believe that we are transitioning to an end-of-life situation and Dr. Gilda Crease has  little hope that the area that Ithe debrided area will heal. I also note that she was recently given antibiotics by her PCP. If improvement isn't seen by Monday we may need to try a different medication

## 2020-08-06 NOTE — Telephone Encounter (Signed)
I called [facility] and spoke to [facility] and made her aware of the NP's instructions.

## 2020-08-09 ENCOUNTER — Encounter (INDEPENDENT_AMBULATORY_CARE_PROVIDER_SITE_OTHER): Payer: Self-pay | Admitting: Vascular Surgery

## 2020-08-09 ENCOUNTER — Other Ambulatory Visit: Payer: Self-pay

## 2020-08-09 ENCOUNTER — Ambulatory Visit (INDEPENDENT_AMBULATORY_CARE_PROVIDER_SITE_OTHER): Payer: Medicare HMO | Admitting: Nurse Practitioner

## 2020-08-09 ENCOUNTER — Encounter: Payer: Self-pay | Admitting: Nurse Practitioner

## 2020-08-09 VITALS — BP 113/62 | HR 116 | Temp 98.0°F | Ht <= 58 in | Wt 102.0 lb

## 2020-08-09 DIAGNOSIS — I739 Peripheral vascular disease, unspecified: Secondary | ICD-10-CM | POA: Diagnosis not present

## 2020-08-09 DIAGNOSIS — I10 Essential (primary) hypertension: Secondary | ICD-10-CM

## 2020-08-09 DIAGNOSIS — I70263 Atherosclerosis of native arteries of extremities with gangrene, bilateral legs: Secondary | ICD-10-CM

## 2020-08-09 DIAGNOSIS — Z0001 Encounter for general adult medical examination with abnormal findings: Secondary | ICD-10-CM | POA: Diagnosis not present

## 2020-08-09 DIAGNOSIS — Z23 Encounter for immunization: Secondary | ICD-10-CM | POA: Diagnosis not present

## 2020-08-09 DIAGNOSIS — T8789 Other complications of amputation stump: Secondary | ICD-10-CM

## 2020-08-09 DIAGNOSIS — M79604 Pain in right leg: Secondary | ICD-10-CM

## 2020-08-09 MED ORDER — SULFAMETHOXAZOLE-TRIMETHOPRIM 800-160 MG PO TABS: 1.0000 | ORAL_TABLET | Freq: Two times a day (BID) | ORAL | 0 refills | Status: DC

## 2020-08-09 MED ORDER — GABAPENTIN 600 MG PO TABS: 600.0000 mg | ORAL_TABLET | Freq: Two times a day (BID) | ORAL | 2 refills | Status: AC

## 2020-08-09 MED ORDER — OXYCODONE-ACETAMINOPHEN 5-325 MG PO TABS: 1.0000 | ORAL_TABLET | Freq: Four times a day (QID) | ORAL | 0 refills | Status: DC | PRN

## 2020-08-09 NOTE — Progress Notes (Signed)
Patient ID: [name], female   DOB: 10-11-1946, 74 y.o.   MRN: [id number]  Chief Complaint  Patient presents with  . Follow-up    right stump debridement    HPI [name] is a 74 y.o. female.    Patient continues to have severe pain primarily of the right above-knee amputation stump but also the left.   Past Medical History:  Diagnosis Date  . Arthritis    right  and left hip  . Dysrhythmia   . GERD (gastroesophageal reflux disease)   . History of hiatal hernia    pt told 35 yrs ago, describes as small., causing acid reflux  . History of kidney stones    kidney stone resolved  . Hypertension   . Peripheral vascular disease (HCC)   . Pneumonia 07/14/15  . Tobacco abuse   . Unilateral small kidney    a. one functional kidney    Past Surgical History:  Procedure Laterality Date  . ABOVE KNEE LEG AMPUTATION Right 01/2015  . AMPUTATION Left 07/16/2015   Procedure: AMPUTATION ABOVE KNEE;  Surgeon: Renford Dills, MD;  Location: [facility];  Service: Vascular;  Laterality: Left;  . AMPUTATION Left 07/23/2015   Procedure:   WOUND CLOSURE;  Surgeon: Renford Dills, MD;  Location: [facility];  Service: Vascular;  Laterality: Left;  . AMPUTATION Left 11/19/2015   Procedure:  ( REVISION ) AMPUTATION ABOVE KNEE ;  Surgeon: Renford Dills, MD;  Location: [facility];  Service: Vascular;  Laterality: Left;  . APPENDECTOMY  1969  . APPLICATION OF WOUND VAC Right 07/08/2020   Procedure: APPLICATION OF WOUND VAC;  Surgeon: Renford Dills, MD;  Location: [facility];  Service: Vascular;  Laterality: Right;  . BREAST BIOPSY Right 1970   neg  . ECTOPIC PREGNANCY SURGERY Right 1969  . FOREIGN BODY REMOVAL Left 07/28/2016   Procedure: FOREIGN BODY REMOVAL ADULT ( LEG );  Surgeon: Renford Dills, MD;  Location: [facility];  Service: Vascular;  Laterality: Left;  . PERIPHERAL VASCULAR CATHETERIZATION N/A 07/22/2015   Procedure: Abdominal Aortogram w/Lower Extremity;   Surgeon: Annice Needy, MD;  Location: [facility];  Service: Cardiovascular;  Laterality: N/A;  . PERIPHERAL VASCULAR CATHETERIZATION  07/22/2015   Procedure: Lower Extremity Intervention;  Surgeon: Annice Needy, MD;  Location: [facility];  Service: Cardiovascular;;  . stents left leg    . VISCERAL ANGIOGRAPHY N/A 03/02/2020   Procedure: VISCERAL ANGIOGRAPHY;  Surgeon: Annice Needy, MD;  Location: [facility];  Service: Cardiovascular;  Laterality: N/A;  . WOUND DEBRIDEMENT Right 07/08/2020   Procedure: DEBRIDEMENT WOUND;  Surgeon: Renford Dills, MD;  Location: [facility];  Service: Vascular;  Laterality: Right;      Allergies  Allergen Reactions  . Contrast Media [Iodinated Diagnostic Agents] Diarrhea, Nausea And Vomiting, Swelling and Rash  . Morphine And Related Diarrhea and Nausea And Vomiting    Current Outpatient Medications  Medication Sig Dispense Refill  . aspirin 81 MG tablet Take 81 mg by mouth every morning.     Marland Kitchen atorvastatin (LIPITOR) 40 MG tablet Take 1 tablet (40 mg total) by mouth at bedtime. 30 tablet 5  . Cholecalciferol (VITAMIN D-3) 125 MCG (5000 UT) TABS Take 5,000 Units by mouth daily.    . clopidogrel (PLAVIX) 75 MG tablet Take 1 tablet (75 mg total) by mouth daily. 30 tablet 3  . Cyanocobalamin (VITAMIN B-12 PO) Take 1 tablet  by mouth 3 (three) times a week.    . famotidine (PEPCID) 20 MG tablet Take 1 tablet (20 mg total) by mouth every morning. 30 tablet 5  . ferrous sulfate 325 (65 FE) MG EC tablet Take 1 tablet (325 mg total) by mouth 2 (two) times daily. (Patient taking differently: Take 325 mg by mouth 2 (two) times a week. ) 60 tablet 3  . HYDROmorphone (DILAUDID) 2 MG tablet Take 0.5 tablets (1 mg total) by mouth every 8 (eight) hours as needed for severe pain. 25 tablet 0  . lisinopril (ZESTRIL) 20 MG tablet Take 1 tablet (20 mg total) by mouth daily. 30 tablet 5  . magnesium hydroxide (MILK OF MAGNESIA) 400 MG/5ML suspension  Take 30 mLs by mouth daily as needed for mild constipation.    Marland Kitchen oxyCODONE-acetaminophen (PERCOCET/ROXICET) 5-325 MG tablet Take 1 tablet by mouth every 6 (six) hours as needed.    . cephALEXin (KEFLEX) 500 MG capsule Take 1 capsule (500 mg total) by mouth 3 (three) times daily. Reported on 11/08/2015 30 capsule 0  . diphenhydrAMINE (BENADRYL) 25 mg capsule Take 1 capsule (25 mg total) by mouth as directed. Take one pill night before CT scan and one pill the morning of the scan (Patient not taking: Reported on 07/26/2020) 2 capsule 0  . gabapentin (NEURONTIN) 300 MG capsule TAKE 1 CAPSULE BY MOUTH TWICE A DAY 60 capsule 3   No current facility-administered medications for this visit.        Physical Exam BP 114/62 (BP Location: Left Arm)   Pulse (!) 105  Gen:  WD/WN, NAD Skin: incision VAC is removed and there is desiccation of the surrounding tissues.  There is no evidence of granulation tissue.  Essentially the wound appears ischemic     Assessment/Plan: 1. 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) This is a very complicated and difficult situation.  Review of the CT angiogram with the patient on a previous visit does not demonstrate any potential for further arterial revascularization or reconstruction.  Excision of the necrotic area of the right amputation stump has not yielded any pain relief and the wound bed is now just as poorly healing as the original lesion.  For the time being I will continue her narcotics as she has more than ample reason to have significant pain.  Clearly this is atherosclerotic ischemic rest pain.  We did discuss the use of oxycodone versus Dilaudid.  For the time being the patient felt like Dilaudid was too strong but was hoping that she could have both I informed her that with the given constraints and rules and regulations that we would need to choose 1 and if the Cotolone was working for the most part we should stick  with it.  I did also discuss pain management I have attempted to contact them and will continue to try.  The question I have is would a nerve ablation be possible or would this be a situation where a TENS unit might help.  This will not help [place] with our wound healing problem but it may help provide relief from her ischemic pain.  I also discussed palliative care and hospice.  She is reluctant because she does not feel like she is dying.  However I tried to explain that this is a chronic problem that likely will continue unto the day of her death furthermore it would likely get worse and that if we could establish with services such as  palliative care or even hospice that she would have additional support throughout this process.     Levora Dredge 08/09/2020, 1:47 PM   This note was created with Dragon medical transcription system.  Any errors from dictation are unintentional.

## 2020-08-09 NOTE — Progress Notes (Signed)
[facility] [address] [place], [place] [id number]  Internal MEDICINE  Office Visit Note  Patient Name: [name]  [id number]  [id number]  Date of Service: 08/18/2020   Pt is here for routine health maintenance examination   Chief Complaint  Patient presents with   Medicare Wellness    referral [facility] at [facility]   Gastroesophageal Reflux   Hypertension   Quality Metric Gaps    tetnaus, colonoscopy   controlled substance form    reviewed with PT   Pain    in a lot of pain   Medication Refill    oxy,   visits    vitural visit request until she gets better     The patient is here for health maintenance exam. The patient has wound with cellulitis present at base of stump of right side. She states that this started about 2 weeks ago with what appeared as bruise like lesion posterior aspect of the right stump. With time, pain became worse. Wound opened up at the base of the stump. Is currently oozing serosanguinous fluid. Patient sates that previously prescribed pain medication is not touching the pain associated with this. She feels like there is a dagger sticking in through the base of the stump and into the right leg. She states that pain is much worse with any movement.  She would like to be referred to new vascular surgeon. She states that she was told her current vascular surgeon told her there was nothing else that could be done for her.    Current Medication: Outpatient Encounter Medications as of 08/09/2020  Medication Sig   aspirin 81 MG tablet Take 81 mg by mouth every morning.    atorvastatin (LIPITOR) 40 MG tablet Take 1 tablet (40 mg total) by mouth at bedtime.   cephALEXin (KEFLEX) 500 MG capsule Take 1 capsule (500 mg total) by mouth 3 (three) times daily. Reported on 11/08/2015   Cholecalciferol (VITAMIN D-3) 125 MCG (5000 UT) TABS Take 5,000 Units by mouth daily.   clopidogrel (PLAVIX) 75 MG tablet Take 1 tablet (75  mg total) by mouth daily.   Cyanocobalamin (VITAMIN B-12 PO) Take 1 tablet by mouth 3 (three) times a week.   diphenhydrAMINE (BENADRYL) 25 mg capsule Take 1 capsule (25 mg total) by mouth as directed. Take one pill night before CT scan and one pill the morning of the scan   famotidine (PEPCID) 20 MG tablet Take 1 tablet (20 mg total) by mouth every morning.   ferrous sulfate 325 (65 FE) MG EC tablet Take 1 tablet (325 mg total) by mouth 2 (two) times daily. (Patient taking differently: Take 325 mg by mouth 2 (two) times a week. )   lisinopril (ZESTRIL) 20 MG tablet Take 1 tablet (20 mg total) by mouth daily.   magnesium hydroxide (MILK OF MAGNESIA) 400 MG/5ML suspension Take 30 mLs by mouth daily as needed for mild constipation.   oxyCODONE-acetaminophen (PERCOCET/ROXICET) 5-325 MG tablet Take 1 tablet by mouth every 6 (six) hours as needed.   [DISCONTINUED] gabapentin (NEURONTIN) 300 MG capsule TAKE 1 CAPSULE BY MOUTH TWICE A DAY   [DISCONTINUED] HYDROmorphone (DILAUDID) 2 MG tablet Take 0.5 tablets (1 mg total) by mouth every 8 (eight) hours as needed for severe pain.   [DISCONTINUED] oxyCODONE-acetaminophen (PERCOCET/ROXICET) 5-325 MG tablet Take 1 tablet by mouth every 6 (six) hours as needed.   gabapentin (NEURONTIN) 600 MG tablet Take 1 tablet (600 mg total) by mouth  2 (two) times daily.   sulfamethoxazole-trimethoprim (BACTRIM DS) 800-160 MG tablet Take 1 tablet by mouth 2 (two) times daily.   No facility-administered encounter medications on file as of 08/09/2020.    Surgical History: Past Surgical History:  Procedure Laterality Date   ABOVE KNEE LEG AMPUTATION Right 01/2015   AMPUTATION Left 07/16/2015   Procedure: AMPUTATION ABOVE KNEE;  Surgeon: Renford Dills, MD;  Location: [facility];  Service: Vascular;  Laterality: Left;   AMPUTATION Left 07/23/2015   Procedure:   WOUND CLOSURE;  Surgeon: Renford Dills, MD;  Location: [facility];  Service: Vascular;   Laterality: Left;   AMPUTATION Left 11/19/2015   Procedure:  ( REVISION ) AMPUTATION ABOVE KNEE ;  Surgeon: Renford Dills, MD;  Location: [facility];  Service: Vascular;  Laterality: Left;   APPENDECTOMY  1969   APPLICATION OF WOUND VAC Right 07/08/2020   Procedure: APPLICATION OF WOUND VAC;  Surgeon: Renford Dills, MD;  Location: [facility];  Service: Vascular;  Laterality: Right;   BREAST BIOPSY Right 1970   neg   ECTOPIC PREGNANCY SURGERY Right 1969   FOREIGN BODY REMOVAL Left 07/28/2016   Procedure: FOREIGN BODY REMOVAL ADULT ( LEG );  Surgeon: Renford Dills, MD;  Location: [facility];  Service: Vascular;  Laterality: Left;   PERIPHERAL VASCULAR CATHETERIZATION N/A 07/22/2015   Procedure: Abdominal Aortogram w/Lower Extremity;  Surgeon: Annice Needy, MD;  Location: [facility];  Service: Cardiovascular;  Laterality: N/A;   PERIPHERAL VASCULAR CATHETERIZATION  07/22/2015   Procedure: Lower Extremity Intervention;  Surgeon: Annice Needy, MD;  Location: [facility];  Service: Cardiovascular;;   stents left leg     VISCERAL ANGIOGRAPHY N/A 03/02/2020   Procedure: VISCERAL ANGIOGRAPHY;  Surgeon: Annice Needy, MD;  Location: [facility];  Service: Cardiovascular;  Laterality: N/A;   WOUND DEBRIDEMENT Right 07/08/2020   Procedure: DEBRIDEMENT WOUND;  Surgeon: Renford Dills, MD;  Location: [facility];  Service: Vascular;  Laterality: Right;    Medical History: Past Medical History:  Diagnosis Date   Arthritis    right  and left hip   Dysrhythmia    GERD (gastroesophageal reflux disease)    History of hiatal hernia    pt told 35 yrs ago, describes as small., causing acid reflux   History of kidney stones    kidney stone resolved   Hypertension    Peripheral vascular disease (HCC)    Pneumonia 07/14/15   Tobacco abuse    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 Negative for chills and unexpected weight change.       Patient having hard time even moving as pain in her right leg is so severe.  HENT: Negative for congestion, postnasal drip, rhinorrhea, sneezing and sore throat.   Respiratory: Negative for cough, chest tightness and shortness of breath.   Cardiovascular: Negative for chest pain and palpitations.  Gastrointestinal: Negative for abdominal pain, constipation, diarrhea, nausea and vomiting.  Endocrine: Negative for cold intolerance, heat intolerance, polydipsia and polyuria.  Musculoskeletal: Positive for arthralgias and myalgias. Negative for back  pain, joint swelling and neck pain.       Severe pain in right stump and upper leg.   Skin: Positive for wound. Negative for rash.       Base of the stump of right leg.   Neurological: Negative for dizziness, tremors, numbness and headaches.  Hematological: Negative for adenopathy. Does not bruise/bleed easily.  Psychiatric/Behavioral: Negative for behavioral problems (Depression), dysphoric mood, sleep disturbance and suicidal ideas. The patient is nervous/anxious.      Today's Vitals   08/09/20 1440  BP: 113/62  Pulse: (!) 116  Temp: 98 F (36.7 C)  SpO2: 96%  Weight: 102 lb (46.3 kg)  Height: 3\' 10"  (1.168 m)   Body mass index is 33.89 kg/m.  Physical Exam Vitals and nursing note reviewed.  Constitutional:      General: She is in acute distress.     Appearance: Normal appearance. She is well-developed. She is ill-appearing. She is not diaphoretic.  HENT:     Head: Normocephalic and atraumatic.     Nose: Nose normal.     Mouth/Throat:     Pharynx: No oropharyngeal exudate.  Eyes:     Pupils: Pupils are equal, round, and reactive to light.  Neck:     Thyroid:  No thyromegaly.     Vascular: No JVD.     Trachea: No tracheal deviation.  Cardiovascular:     Rate and Rhythm: Regular rhythm. Tachycardia present.     Heart sounds: Normal heart sounds. No murmur heard.  No friction rub. No gallop.   Pulmonary:     Effort: Pulmonary effort is normal. No respiratory distress.     Breath sounds: Normal breath sounds. No wheezing or rales.  Chest:     Chest wall: No tenderness.  Abdominal:     General: Bowel sounds are normal.     Palpations: Abdomen is soft.     Tenderness: There is no abdominal tenderness.  Musculoskeletal:        General: Normal range of motion.     Cervical back: Normal range of motion and neck supple.     Comments: She has severe pain in right stump. There is grimacing present with nearly every movement.   Lymphadenopathy:     Cervical: No cervical adenopathy.  Skin:    General: Skin is warm and dry.     Comments: Right stump is currently wrapped in clean dry dressing.   Neurological:     General: No focal deficit present.     Mental Status: She is alert and oriented to person, place, and time.     Cranial Nerves: No cranial nerve deficit.  Psychiatric:        Attention and Perception: Attention and perception normal.        Mood and Affect: Affect normal. Mood is anxious.        Speech: Speech normal.        Behavior: Behavior normal. Behavior is cooperative.        Thought Content: Thought content normal.        Cognition and Memory: Cognition and memory normal.        Judgment: Judgment normal.    Depression screen [facility] 2/9 08/09/2020 03/22/2020 12/29/2019 06/27/2019 12/30/2018  Decreased Interest 0 0 0 0 0  Down, Depressed, Hopeless 0 0 0 0 0  PHQ - 2 Score 0 0 0 0 0    Functional Status Survey: Is the patient deaf or have difficulty hearing?: Yes Does the patient  have difficulty seeing, even when wearing glasses/contacts?: No Does the patient have difficulty concentrating, remembering, or making decisions?:  Yes Does the patient have difficulty walking or climbing stairs?: Yes Does the patient have difficulty dressing or bathing?: Yes Does the patient have difficulty doing errands alone such as visiting a doctor's office or shopping?: Yes  MMSE - Mini Mental State Exam 08/09/2020 06/27/2019 02/18/2018  Orientation to time 5 5 5   Orientation to Place 5 5 5   Registration 3 3 3   Attention/ Calculation 5 5 3   Recall 3 3 3   Language- name 2 objects 2 2 2   Language- repeat 1 1 1   Language- follow 3 step command 3 3 3   Language- read & follow direction 1 1 1   Write a sentence 1 1 1   Copy design 1 1 1   Total score 30 30 28     Fall Risk  08/09/2020 03/22/2020 12/29/2019 06/27/2019 12/30/2018  Falls in the past year? 0 0 0 0 0      LABS: Recent Results (from the past 2160 hour(s))  CBC with Differential     Status: Abnormal   Collection Time: 06/22/20  8:31 PM  Result Value Ref Range   WBC 14.3 (H) 4.0 - 10.5 K/uL   RBC 3.07 (L) 3.87 - 5.11 MIL/uL   Hemoglobin 8.0 (L) 12.0 - 15.0 g/dL   HCT (L) 36 - 46 %   MCV 86.6 80.0 - 100.0 fL   MCH 26.1 26.0 - 34.0 pg   MCHC 30.1 30.0 - 36.0 g/dL   RDW (H) - %   Platelets 382 150 - 400 K/uL   nRBC 0.0 0.0 - 0.2 %   Neutrophils Relative % 92 %   Neutro Abs 13.1 (H) 1.7 - 7.7 K/uL   Lymphocytes Relative 4 %   Lymphs Abs 0.5 (L) 0.7 - 4.0 K/uL   Monocytes Relative 3 %   Monocytes Absolute 0.5 0.1 - 1.0 K/uL   Eosinophils Relative 0 %   Eosinophils Absolute 0.0 0.0 - 0.5 K/uL   Basophils Relative 0 %   Basophils Absolute 0.0 0.0 - 0.1 K/uL   Immature Granulocytes 1 %   Abs Immature Granulocytes 0.13 (H) 0.00 - 0.07 K/uL    Comment: Performed at [facility], [address] [place], 12/31/2019 08/27/2019  Comprehensive metabolic panel     Status: Abnormal   Collection Time: 06/22/20  8:31 PM  Result Value Ref Range   Sodium 138 135 - 145 mmol/L   Potassium 3.9 3.5 - 5.1 mmol/L   Chloride 109 98 - 111 mmol/L   CO2 19  (L) 22 - 32 mmol/L   Glucose, Bld 96 70 - 99 mg/dL    Comment: Glucose reference range applies only to samples taken after fasting for at least 8 hours.   BUN 26 (H) 8 - 23 mg/dL   Creatinine, Ser 2161 0.44 - 1.00 mg/dL   Calcium 8.8 (L) 8.9 - 10.3 mg/dL   Total Protein 6.6 6.5 - 8.1 g/dL   Albumin 3.2 (L) 3.5 - 5.0 g/dL   AST 14 (L) 15 - 41 U/L   ALT 9 0 - 44 U/L   Alkaline Phosphatase 94 38 - 126 U/L   Total Bilirubin 0.5 0.3 - 1.2 mg/dL   GFR calc non Af Amer >60 >60 mL/min   GFR calc Af Amer >60 >60 mL/min   Anion gap 10 5 - 15    Comment: Performed at 08/22/20  [facility], [address] [place], [place] [address]  Lactic acid, plasma     Status: None   Collection Time: 06/22/20  8:31 PM  Result Value Ref Range   Lactic Acid, Venous 0.8 0.5 - 1.9 mmol/L    Comment: Performed at [facility], [address] [place], [place] [address]  Sedimentation rate     Status: None   Collection Time: 06/22/20  8:31 PM  Result Value Ref Range   Sed Rate 23 0 - 30 mm/hr    Comment: Performed at [facility], [address] [place], [place] [address]  SARS CORONAVIRUS 2 (TAT 6-24 HRS) Nasopharyngeal Nasopharyngeal Swab     Status: None   Collection Time: 07/07/20  1:53 PM   Specimen: Nasopharyngeal Swab  Result Value Ref Range   SARS Coronavirus 2 NEGATIVE NEGATIVE    Comment: (NOTE) SARS-CoV-2 target nucleic acids are NOT DETECTED.  The SARS-CoV-2 RNA is generally detectable in upper and lower respiratory specimens during the acute phase of infection. Negative results do not preclude SARS-CoV-2 infection, do not rule out co-infections with other pathogens, and should not be used as the sole basis for treatment or other patient management decisions. Negative results must be combined with clinical observations, patient history, and epidemiological information. The expected result is Negative.  Fact Sheet for  Patients: HairSlick.no  Fact Sheet for Healthcare Providers: quierodirigir.com  This test is not yet approved or cleared by the [place] FDA and  has been authorized for detection and/or diagnosis of SARS-CoV-2 by FDA under an Emergency Use Authorization (EUA). This EUA will remain  in effect (meaning this test can be used) for the duration of the COVID-19 declaration under Se ction 564(b)(1) of the Act, 21 U.S.C. section 360bbb-3(b)(1), unless the authorization is terminated or revoked sooner.  Performed at [facility], [address], [place], [place] [address]   APTT     Status: Abnormal   Collection Time: 07/08/20  6:33 AM  Result Value Ref Range   aPTT 37 (H) 24 - 36 seconds    Comment:        IF BASELINE aPTT IS ELEVATED, SUGGEST PATIENT RISK ASSESSMENT BE USED TO DETERMINE APPROPRIATE ANTICOAGULANT THERAPY. Performed at [facility], [address] [place], [place] [address]   Basic metabolic panel     Status: Abnormal   Collection Time: 07/08/20  6:33 AM  Result Value Ref Range   Sodium 135 135 - 145 mmol/L   Potassium 3.5 3.5 - 5.1 mmol/L   Chloride 102 98 - 111 mmol/L   CO2 21 (L) 22 - 32 mmol/L   Glucose, Bld 98 70 - 99 mg/dL    Comment: Glucose reference range applies only to samples taken after fasting for at least 8 hours.   BUN 17 8 - 23 mg/dL   Creatinine, Ser 8.92 0.44 - 1.00 mg/dL   Calcium 9.5 8.9 - 11.9 mg/dL   GFR calc non Af Amer >60 >60 mL/min   GFR calc Af Amer >60 >60 mL/min   Anion gap 12 5 - 15    Comment: Performed at [facility], [address] [place], [place] [address]  CBC WITH DIFFERENTIAL     Status: Abnormal   Collection Time: 07/08/20  6:33 AM  Result Value Ref Range   WBC 13.4 (H) 4.0 - 10.5 K/uL   RBC 3.71 (L) 3.87 - 5.11 MIL/uL   Hemoglobin 9.2 (L) 12.0 - 15.0 g/dL  HCT 30.1 (L) 36 - 46 %   MCV 81.1 80.0 - 100.0 fL   MCH 24.8 (L) 26.0 -  34.0 pg   MCHC 30.6 30.0 - 36.0 g/dL   RDW 16.1 (H) 09.6 - 04.5 %   Platelets 463 (H) 150 - 400 K/uL   nRBC 0.0 0.0 - 0.2 %   Neutrophils Relative % 85 %   Neutro Abs 11.4 (H) 1.7 - 7.7 K/uL   Lymphocytes Relative 7 %   Lymphs Abs 1.0 0.7 - 4.0 K/uL   Monocytes Relative 5 %   Monocytes Absolute 0.7 0.1 - 1.0 K/uL   Eosinophils Relative 1 %   Eosinophils Absolute 0.2 0.0 - 0.5 K/uL   Basophils Relative 1 %   Basophils Absolute 0.1 0.0 - 0.1 K/uL   Immature Granulocytes 1 %   Abs Immature Granulocytes 0.11 (H) 0.00 - 0.07 K/uL    Comment: Performed at [facility], [address] [place], [place] [address]  Protime-INR     Status: None   Collection Time: 07/08/20  6:33 AM  Result Value Ref Range   Prothrombin Time 12.8 11.4 - 15.2 seconds   INR 1.0 0.8 - 1.2    Comment: (NOTE) INR goal varies based on device and disease states. Performed at [facility], [address] [place], [place] [address]   Type and screen     Status: None   Collection Time: 07/08/20  6:33 AM  Result Value Ref Range   ABO/RH(D) O POS    Antibody Screen NEG    Sample Expiration      07/11/2020,2359 Performed at [facility], [address] [place] [place], [place] [address]   Surgical pathology     Status: None   Collection Time: 07/08/20  8:20 AM  Result Value Ref Range   SURGICAL PATHOLOGY      SURGICAL PATHOLOGY CASE: [id number] PATIENT: Zamyra Ancrum Surgical Pathology Report     Specimen Submitted: A. 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 SKIN AND SOFT TISSUE, WOUND FROM RIGHT STUMP; DEBRIDEMENT: - ULCERATED SKIN AND SUBCUTANEOUS TISSUE WITH ABSCESS AND NECROSIS. - NEGATIVE FOR MALIGNANCY.   GROSS DESCRIPTION: A. Labeled: Wound from right stump Received: Formalin Tissue fragment(s): 2 Size: 2.4 x 1.5 x 1.1 cm and 8.2 x 7.3 x 1.8 cm Description: The larger fragment has a  circular portion of tan-white and smooth skin with a central 8.2 x 6.1 cm area of black-brown and firm roughened discoloration.  The area of discoloration is focally less than 0.1 cm to be closest peripheral margin.  The second fragment is comprised of yellow lobulated adipose tissue.  The resection margin of the larger fragment is inked blue and the smaller fragment is entirely inked black.  Und erlying the area of discoloration is yellow-pink lobulated adipose tissue.  The area of discoloration is focally 0.5 cm to be closest deep margin.  No distinct masses or lesions are grossly appreciated.  The specimen is submitted as follows: 1 - closest approach of discoloration to peripheral margin 2 - closest approach of discoloration to the margin 3 - representative additional sections    Final Diagnosis performed by Georgeanna Harrison, MD.   Electronically signed 07/09/2020 9:27:49AM The electronic signature indicates that the named Attending Pathologist has evaluated the specimen Technical component performed at [facility], [address] [place], [place] [address] Lab: [phone] Dir: Jolene Schimke, MD, MMM  Professional component performed at [facility], [facility], [address] [place], [place], [place] [address] Lab: [phone] Dir: Georgiann Cocker. Oneita Kras, MD     Assessment/Plan: 1. Encounter for general adult medical examination with abnormal findings Annual health maintenance exam today.  2. Pain of amputation stump of right lower extremity (HCC) Increase dose gabapentin to  twice daily. Short term prescription for oxycodone/APAP 5/325mg  tablets. Patient may take up to four times daily as needed for severe pain. Single prescription for #30 tablets was sent to her pharmacy today.  - gabapentin (NEURONTIN) 600 MG tablet; Take 1 tablet (600 mg total) by mouth 2 (two) times daily.  Dispense: 60 tablet; Refill: 2 - oxyCODONE-acetaminophen (PERCOCET/ROXICET) 5-325 MG tablet;  Take 1 tablet by mouth every 6 (six) hours as needed.  Dispense: 30 tablet; Refill: 0  3. Peripheral vascular disease [facility]) Referral to new vascular surgeon for second opinion made today.  - Ambulatory referral to Vascular Surgery  4. 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 Continue to keep wound clean and dry and wrapped in clean dressings. Referral to new vascular surgeon for second opinion made today.  - Ambulatory referral to Vascular Surgery - sulfamethoxazole-trimethoprim (BACTRIM DS) 800-160 MG tablet; Take 1 tablet by mouth 2 (two) times daily.  Dispense: 28 tablet; Refill: 0  5. Essential hypertension Continue bp medication as prescribed   6. Needs flu shot Flu vaccine administered today.  - Flu Vaccine MDCK QUAD PF  General Counseling: latacha texeira understanding of the findings of todays visit and agrees with plan of treatment. I have discussed any further diagnostic evaluation that may be needed or ordered today. We also reviewed her medications today. she has been encouraged to call the office with any questions or concerns that should arise related to todays visit.    Counseling:  Reviewed risks and possible side effects associated with taking opiates, benzodiazepines and other CNS depressants. Combination of these could cause dizziness and drowsiness. Advised patient not to drive or operate machinery when taking these medications, as patient's and other's life can be at risk and will have consequences. Patient verbalized understanding in this matter. Dependence and abuse for these drugs will be monitored closely. A Controlled substance policy and procedure is on file which allows [facility] to order a urine drug screen test at any visit. Patient understands and agrees with the plan  This patient was seen by Vincent Gros FNP Collaboration with Dr Lyndon Code as a part of collaborative care agreement  Orders Placed This Encounter  Procedures   Flu Vaccine MDCK QUAD PF   Ambulatory referral to Vascular Surgery    Meds ordered this encounter  Medications   gabapentin (NEURONTIN) 600 MG tablet    Sig: Take 1 tablet (600 mg total) by mouth 2 (two) times daily.    Dispense:  60 tablet    Refill:  2    Changed dosing to  tablets    Order Specific Question:   Supervising Provider    Answer:   Lyndon Code [[id number]]   oxyCODONE-acetaminophen (PERCOCET/ROXICET) 5-325 MG tablet    Sig: Take 1 tablet by mouth every 6 (six) hours as needed.    Dispense:  30 tablet    Refill:  0    Order Specific Question:   Supervising Provider    Answer:   Lyndon Code [[id number]]   sulfamethoxazole-trimethoprim (BACTRIM DS)  800-160 MG tablet    Sig: Take 1 tablet by mouth 2 (two) times daily.    Dispense:  28 tablet    Refill:  0    D/c prescription for keflex.    Order Specific Question:   Supervising Provider    Answer:   Lyndon Code [[id number]]    Total time spent: 45 Minutes  Time spent includes review of chart, medications, test results, and follow up plan with the patient.     Lyndon Code, MD  Internal Medicine

## 2020-08-12 ENCOUNTER — Other Ambulatory Visit: Payer: Medicare HMO | Admitting: Adult Health Nurse Practitioner

## 2020-08-12 ENCOUNTER — Other Ambulatory Visit: Payer: Self-pay

## 2020-08-12 ENCOUNTER — Telehealth: Payer: Self-pay

## 2020-08-12 DIAGNOSIS — T8789 Other complications of amputation stump: Secondary | ICD-10-CM | POA: Diagnosis not present

## 2020-08-12 DIAGNOSIS — T8753 Necrosis of amputation stump, right lower extremity: Secondary | ICD-10-CM | POA: Diagnosis not present

## 2020-08-12 DIAGNOSIS — Z515 Encounter for palliative care: Secondary | ICD-10-CM

## 2020-08-12 DIAGNOSIS — I739 Peripheral vascular disease, unspecified: Secondary | ICD-10-CM

## 2020-08-12 DIAGNOSIS — S81801A Unspecified open wound, right lower leg, initial encounter: Secondary | ICD-10-CM

## 2020-08-12 DIAGNOSIS — S71101A Unspecified open wound, right thigh, initial encounter: Secondary | ICD-10-CM

## 2020-08-12 DIAGNOSIS — R69 Illness, unspecified: Secondary | ICD-10-CM | POA: Diagnosis not present

## 2020-08-12 DIAGNOSIS — I509 Heart failure, unspecified: Secondary | ICD-10-CM | POA: Diagnosis not present

## 2020-08-12 DIAGNOSIS — Z993 Dependence on wheelchair: Secondary | ICD-10-CM | POA: Diagnosis not present

## 2020-08-12 DIAGNOSIS — Z89611 Acquired absence of right leg above knee: Secondary | ICD-10-CM | POA: Diagnosis not present

## 2020-08-12 DIAGNOSIS — I110 Hypertensive heart disease with heart failure: Secondary | ICD-10-CM | POA: Diagnosis not present

## 2020-08-12 DIAGNOSIS — T879 Unspecified complications of amputation stump: Secondary | ICD-10-CM

## 2020-08-12 DIAGNOSIS — I998 Other disorder of circulatory system: Secondary | ICD-10-CM | POA: Diagnosis not present

## 2020-08-12 DIAGNOSIS — Z9181 History of falling: Secondary | ICD-10-CM | POA: Diagnosis not present

## 2020-08-12 DIAGNOSIS — I10 Essential (primary) hypertension: Secondary | ICD-10-CM

## 2020-08-12 NOTE — Progress Notes (Signed)
Therapist, nutritional Palliative Care Consult Note Telephone: [phone]  Fax: [phone]  PATIENT NAME: [name] DOB: 03/06/1946 MRN: [id number]  PRIMARY CARE PROVIDER:   Carlean Jews, NP  REFERRING PROVIDER:  Carlean Jews, NP [address] [place],  [place] [address]  RESPONSIBLE PARTY:   Self [phone] Concepcion Elk, daughter [phone]    RECOMMENDATIONS and PLAN:  1.  Advanced care planning.  Patient has Living Will.  Encouraged to give copy to her provider.  Left MOST form to go over with family.  2.  Functional status.  Patient is able to transfer independently.  Is able to independently perform ADLs.  Does have problems getting in and out of car for appointments.  Is continent of B&B.  States that Dr. Lorretta Harp had ordered a lift wheelchair but has not heard anything since. Will reach out to Dr. Eustace Quail office on status of the wheelchair.  3.  Nutritional status.  Patient states that she has some days in which she eats better than other days.  Family does state that it does seem as if she has lost weight.  They have noticed that she is lighter when they help her into the car.  Have encouraged supplements such as Ensure or Boost.  Will reach out to PCP with recommendation for remeron for appetite stimulant.  4.  Stump wound.  Patient has chronic wound to right stump.  This is in part due to PAD and PVD.  Patient is being followed by home health RN for wound care.  Patient may benefit from evaluation from wound clinic.  Will reach out to PCP with recommendation for referral for wound clinic.  5. Blood pressure.  Family concerned that BP lower than usual and have advised to take BP each morning before taking BP meds and will evaluate in 2 weeks if any changes need to be made to her BP meds.  6.  Pain. Patient has pain to right hip and thigh.  Has started having pain in lower back.  Does get relief with percocet 5/325 Q 6 hrs PRN and  gabapentin 300 mg BID.  Continue current pain regimen and follow up with vein and vascular for circulation issues that may be causing pain issues.  Palliative will continue to monitor for symptom management/decline and make recommendations as needed.  Follow up visit in 4 weeks. Encourage to call with any question or concerns.  I spent 80 minutes providing this consultation,  from 3:30 to 4:50 including time spent with patient/family, chart review, provider coordination, documentation. More than 50% of the time in this consultation was spent coordinating communication.   HISTORY OF PRESENT ILLNESS:  [name] is a 74 y.o. year old female with multiple medical problems including PAD, bilateral AKA, CAD, HTN, GERD, OA. Palliative Care was asked to help address goals of care. Patient had bilateral AKA about 4 years ago. Hospitalization 5/16-5/19/21 for nausea, diarrhea, abdominal pain and found to have mesenteric ischemia and had angioplasty of SMA and started on plavix and ASA.  03/14/20 seen in ER for right hip pain with no acute findings.  06/23/20 seen in ER for right leg pain and found to have right stump infection.  On 07/08/20 had wound debridement. Patient states that she was told that there was nothing else that could be done.  She is in the process getting an appointment with vein and vascular at [facility] to get a second opinion. Gets occasional dizziness with position changes  but tries to move slowly.  Denies headaches, increased SOB or cough, N/V/D, dysuria, hematuria.  CODE STATUS: see above  PPS: 40% HOSPICE ELIGIBILITY/DIAGNOSIS: TBD  PHYSICAL EXAM:  BP 118/56  HR 95  O2 94% on RA General: NAD, frail appearing, thin Cardiovascular: regular rate and rhythm Pulmonary: lung sounds clear; normal respiratory effort Abdomen: soft, nontender, + bowel sounds Extremities: bilateral AKA Skin: no rashes on exposed skin Neurological: Weakness but otherwise nonfocal   PAST MEDICAL HISTORY:    Past Medical History:  Diagnosis Date  . Arthritis    right  and left hip  . Dysrhythmia   . GERD (gastroesophageal reflux disease)   . History of hiatal hernia    pt told 35 yrs ago, describes as small., causing acid reflux  . History of kidney stones    kidney stone resolved  . Hypertension   . Peripheral vascular disease (HCC)   . Pneumonia 07/14/15  . Tobacco abuse   . Unilateral small kidney    a. one functional kidney    SOCIAL HX:  Social History   Tobacco Use  . Smoking status: Light Tobacco Smoker    Packs/day: 0.25    Years: 55.00    Pack years: 13.75    Types: Cigarettes    Last attempt to quit: 01/20/2015    Years since quitting: 5.5  . Smokeless tobacco: Never Used  Substance Use Topics  . Alcohol use: No    ALLERGIES:  Allergies  Allergen Reactions  . Contrast Media [Iodinated Diagnostic Agents] Diarrhea, Nausea And Vomiting, Swelling and Rash  . Morphine And Related Diarrhea and Nausea And Vomiting     PERTINENT MEDICATIONS:  Outpatient Encounter Medications as of 08/12/2020  Medication Sig  . aspirin 81 MG tablet Take 81 mg by mouth every morning.   Marland Kitchen atorvastatin (LIPITOR) 40 MG tablet Take 1 tablet (40 mg total) by mouth at bedtime.  . cephALEXin (KEFLEX) 500 MG capsule Take 1 capsule (500 mg total) by mouth 3 (three) times daily. Reported on 11/08/2015  . Cholecalciferol (VITAMIN D-3) 125 MCG (5000 UT) TABS Take 5,000 Units by mouth daily.  . clopidogrel (PLAVIX) 75 MG tablet Take 1 tablet (75 mg total) by mouth daily.  . Cyanocobalamin (VITAMIN B-12 PO) Take 1 tablet by mouth 3 (three) times a week.  . diphenhydrAMINE (BENADRYL) 25 mg capsule Take 1 capsule (25 mg total) by mouth as directed. Take one pill night before CT scan and one pill the morning of the scan  . famotidine (PEPCID) 20 MG tablet Take 1 tablet (20 mg total) by mouth every morning.  . ferrous sulfate 325 (65 FE) MG EC tablet Take 1 tablet (325 mg total) by mouth 2 (two) times  daily. (Patient taking differently: Take 325 mg by mouth 2 (two) times a week. )  . gabapentin (NEURONTIN) 600 MG tablet Take 1 tablet (600 mg total) by mouth 2 (two) times daily.  Marland Kitchen lisinopril (ZESTRIL) 20 MG tablet Take 1 tablet (20 mg total) by mouth daily.  . magnesium hydroxide (MILK OF MAGNESIA) 400 MG/5ML suspension Take 30 mLs by mouth daily as needed for mild constipation.  Marland Kitchen oxyCODONE-acetaminophen (PERCOCET/ROXICET) 5-325 MG tablet Take 1 tablet by mouth every 6 (six) hours as needed.  . sulfamethoxazole-trimethoprim (BACTRIM DS) 800-160 MG tablet Take 1 tablet by mouth 2 (two) times daily.   No facility-administered encounter medications on file as of 08/12/2020.     Tova Vater Marlena Clipper, NP

## 2020-08-12 NOTE — Telephone Encounter (Signed)
Pt documents were faxed to [facility] on 10/26 and went through, [facility] advised Pt they werent received.  Refaxing 08/12/20 @10 :30.

## 2020-08-16 DIAGNOSIS — Z9181 History of falling: Secondary | ICD-10-CM | POA: Diagnosis not present

## 2020-08-16 DIAGNOSIS — R69 Illness, unspecified: Secondary | ICD-10-CM | POA: Diagnosis not present

## 2020-08-16 DIAGNOSIS — T8789 Other complications of amputation stump: Secondary | ICD-10-CM | POA: Diagnosis not present

## 2020-08-16 DIAGNOSIS — I739 Peripheral vascular disease, unspecified: Secondary | ICD-10-CM | POA: Diagnosis not present

## 2020-08-16 DIAGNOSIS — I998 Other disorder of circulatory system: Secondary | ICD-10-CM | POA: Diagnosis not present

## 2020-08-16 DIAGNOSIS — Z89611 Acquired absence of right leg above knee: Secondary | ICD-10-CM | POA: Diagnosis not present

## 2020-08-16 DIAGNOSIS — T8753 Necrosis of amputation stump, right lower extremity: Secondary | ICD-10-CM | POA: Diagnosis not present

## 2020-08-16 DIAGNOSIS — I509 Heart failure, unspecified: Secondary | ICD-10-CM | POA: Diagnosis not present

## 2020-08-16 DIAGNOSIS — I110 Hypertensive heart disease with heart failure: Secondary | ICD-10-CM | POA: Diagnosis not present

## 2020-08-16 DIAGNOSIS — Z993 Dependence on wheelchair: Secondary | ICD-10-CM | POA: Diagnosis not present

## 2020-08-17 ENCOUNTER — Telehealth: Payer: Self-pay

## 2020-08-17 ENCOUNTER — Other Ambulatory Visit: Payer: Medicare HMO | Admitting: Adult Health Nurse Practitioner

## 2020-08-17 ENCOUNTER — Other Ambulatory Visit: Payer: Self-pay

## 2020-08-17 DIAGNOSIS — M79605 Pain in left leg: Secondary | ICD-10-CM | POA: Diagnosis not present

## 2020-08-17 DIAGNOSIS — G8929 Other chronic pain: Secondary | ICD-10-CM

## 2020-08-17 DIAGNOSIS — Z515 Encounter for palliative care: Secondary | ICD-10-CM

## 2020-08-17 DIAGNOSIS — M79604 Pain in right leg: Secondary | ICD-10-CM | POA: Diagnosis not present

## 2020-08-17 NOTE — Telephone Encounter (Signed)
rx sent to pharmacy

## 2020-08-17 NOTE — Progress Notes (Signed)
Therapist, nutritional Palliative Care Consult Note Telephone: [phone]  Fax: [phone]  PATIENT NAME: [name] DOB: [birth year] MRN: [id number]  PRIMARY CARE PROVIDER:   Carlean Jews, NP  REFERRING PROVIDER:  Carlean Jews, NP [address] [place],  [place] [address]  RESPONSIBLE PARTY:   Self [phone] [name], daughter [phone]    RECOMMENDATIONS and PLAN:  1.  Advanced care planning.  Patient has Living Will.  2.  Pain.  Have reached out to PCP office while in the home with patient to get refill on her oxycodone.    3. Mobility.  Patient having some weakness more related to her pain.  This is causing mobility issues and she is requiring more help from her husband who has hurt himself trying to help her in and out of bed.  Patient could benefit with a hospital bed for mobility and an all electric hospital bed would help her with more independence in getting in and out of bed.  Have reached out to PCP office with this request.  Palliative will continue to monitor for symptom management/decline and make recommendations as needed.  Follow up visit in 3 weeks. Encourage to call with any question or concerns.  Have reached out to RN navigator to follow up on recommendations for remeron and to check status of lift wheelchair.  I spent 30 minutes providing this consultation,  from 10:30 to 11:00 including time spent with patient/family, chart review, provider coordination, documentation. More than 50% of the time in this consultation was spent coordinating communication.   HISTORY OF PRESENT ILLNESS:  [name] is a 74 y.o. year old female with multiple medical problems including PAD, bilateral AKA, CAD, HTN, GERD, OA. Palliative Care was asked to help address goals of care.  Patient's daughter called asking about getting a hospital bed.  Had stated that her mother has been weaker, in more pain in left hip, and not eating.  Stated  that her husband has having to help her more in and out of bed and felt like she has been weaker in right arm.  Patient states that she ran out of her oxycodone on Sunday and had called her PCP on Monday for refill.  States that she has been repositioning a lot and laying on her right arm and it will fall asleep.  States that when she is in pain that she is not hungry.  Denies increased SOB or cough, chest pain, palpitations, changes in urinary habits, fever, sore throat.  Do not believe that patient has had any event such as CVA or MI.  Believe symptoms are related to her pain.    CODE STATUS: see above  PPS: 40% HOSPICE ELIGIBILITY/DIAGNOSIS: TBD  PHYSICAL EXAM:   General: NAD but does appear uncomfortable, frail appearing, thin Extremities: bilateral AKA Skin: no rashes on exposed skin Neurological: Weakness but otherwise nonfocal; PERRLA, equal hand grips.  No facial drooping or slurred speech noted. A&Ox3  PAST MEDICAL HISTORY:  Past Medical History:  Diagnosis Date  . Arthritis    right  and left hip  . Dysrhythmia   . GERD (gastroesophageal reflux disease)   . History of hiatal hernia    pt told 35 yrs ago, describes as small., causing acid reflux  . History of kidney stones    kidney stone resolved  . Hypertension   . Peripheral vascular disease (HCC)   . Pneumonia 07/14/15  . Tobacco abuse   . Unilateral small  kidney    a. one functional kidney    SOCIAL HX:  Social History   Tobacco Use  . Smoking status: Light Tobacco Smoker    Packs/day: 0.25    Years: 55.00    Pack years: 13.75    Types: Cigarettes    Last attempt to quit: 01/20/2015    Years since quitting: 5.5  . Smokeless tobacco: Never Used  Substance Use Topics  . Alcohol use: No    ALLERGIES:  Allergies  Allergen Reactions  . Contrast Media [Iodinated Diagnostic Agents] Diarrhea, Nausea And Vomiting, Swelling and Rash  . Morphine And Related Diarrhea and Nausea And Vomiting     PERTINENT  MEDICATIONS:  Outpatient Encounter Medications as of 08/17/2020  Medication Sig  . aspirin 81 MG tablet Take 81 mg by mouth every morning.   Marland Kitchen atorvastatin (LIPITOR) 40 MG tablet Take 1 tablet (40 mg total) by mouth at bedtime.  . cephALEXin (KEFLEX) 500 MG capsule Take 1 capsule (500 mg total) by mouth 3 (three) times daily. Reported on 11/08/2015  . Cholecalciferol (VITAMIN D-3) 125 MCG (5000 UT) TABS Take 5,000 Units by mouth daily.  . clopidogrel (PLAVIX) 75 MG tablet Take 1 tablet (75 mg total) by mouth daily.  . Cyanocobalamin (VITAMIN B-12 PO) Take 1 tablet by mouth 3 (three) times a week.  . diphenhydrAMINE (BENADRYL) 25 mg capsule Take 1 capsule (25 mg total) by mouth as directed. Take one pill night before CT scan and one pill the morning of the scan  . famotidine (PEPCID) 20 MG tablet Take 1 tablet (20 mg total) by mouth every morning.  . ferrous sulfate 325 (65 FE) MG EC tablet Take 1 tablet (325 mg total) by mouth 2 (two) times daily. (Patient taking differently: Take 325 mg by mouth 2 (two) times a week. )  . gabapentin (NEURONTIN) 600 MG tablet Take 1 tablet (600 mg total) by mouth 2 (two) times daily.  Marland Kitchen lisinopril (ZESTRIL) 20 MG tablet Take 1 tablet (20 mg total) by mouth daily.  . magnesium hydroxide (MILK OF MAGNESIA) 400 MG/5ML suspension Take 30 mLs by mouth daily as needed for mild constipation.  Marland Kitchen oxyCODONE-acetaminophen (PERCOCET/ROXICET) 5-325 MG tablet Take 1 tablet by mouth every 6 (six) hours as needed.  . sulfamethoxazole-trimethoprim (BACTRIM DS) 800-160 MG tablet Take 1 tablet by mouth 2 (two) times daily.   No facility-administered encounter medications on file as of 08/17/2020.     Whitney Patricelli Marlena Clipper, NP

## 2020-08-18 ENCOUNTER — Telehealth: Payer: Self-pay

## 2020-08-18 ENCOUNTER — Other Ambulatory Visit: Payer: Self-pay | Admitting: Nurse Practitioner

## 2020-08-18 DIAGNOSIS — T8789 Other complications of amputation stump: Secondary | ICD-10-CM

## 2020-08-18 DIAGNOSIS — M79604 Pain in right leg: Secondary | ICD-10-CM | POA: Insufficient documentation

## 2020-08-18 DIAGNOSIS — F321 Major depressive disorder, single episode, moderate: Secondary | ICD-10-CM

## 2020-08-18 DIAGNOSIS — Z23 Encounter for immunization: Secondary | ICD-10-CM | POA: Insufficient documentation

## 2020-08-18 MED ORDER — MIRTAZAPINE 7.5 MG PO TABS: 7.5000 mg | ORAL_TABLET | Freq: Every day | ORAL | 3 refills | Status: DC

## 2020-08-18 MED ORDER — OXYCODONE-ACETAMINOPHEN 5-325 MG PO TABS: 1.0000 | ORAL_TABLET | Freq: Four times a day (QID) | ORAL | 0 refills | Status: AC | PRN

## 2020-08-18 NOTE — Telephone Encounter (Signed)
Pt.notified

## 2020-08-18 NOTE — Telephone Encounter (Signed)
Spoke with pt daughter we add remeron

## 2020-08-18 NOTE — Telephone Encounter (Signed)
I have just sent this prescription to [facility].

## 2020-08-18 NOTE — Telephone Encounter (Signed)
Rx for what was sent to pharmacy? If she has palliative care, can they not get her the stuff she needs, like a hospital bed? I know it was much easier to get that stuff through hospice for my dad.

## 2020-08-18 NOTE — Telephone Encounter (Signed)
I sent prescription for remeron 7.5mg  every evening to [facility].

## 2020-08-19 ENCOUNTER — Telehealth: Payer: Self-pay

## 2020-08-19 ENCOUNTER — Inpatient Hospital Stay
Admission: EM | Admit: 2020-08-19 | Discharge: 2020-08-21 | DRG: 564 | Disposition: A | Discharge status: Hospice | Payer: Medicare HMO | Attending: Internal Medicine | Admitting: Internal Medicine

## 2020-08-19 ENCOUNTER — Other Ambulatory Visit: Payer: Self-pay

## 2020-08-19 ENCOUNTER — Emergency Department: Payer: Medicare HMO

## 2020-08-19 ENCOUNTER — Telehealth: Payer: Self-pay | Admitting: Adult Health Nurse Practitioner

## 2020-08-19 DIAGNOSIS — I70263 Atherosclerosis of native arteries of extremities with gangrene, bilateral legs: Secondary | ICD-10-CM | POA: Diagnosis not present

## 2020-08-19 DIAGNOSIS — A419 Sepsis, unspecified organism: Secondary | ICD-10-CM | POA: Diagnosis present

## 2020-08-19 DIAGNOSIS — R4182 Altered mental status, unspecified: Secondary | ICD-10-CM | POA: Diagnosis present

## 2020-08-19 DIAGNOSIS — E785 Hyperlipidemia, unspecified: Secondary | ICD-10-CM | POA: Diagnosis present

## 2020-08-19 DIAGNOSIS — R000 Tachycardia, unspecified: Secondary | ICD-10-CM | POA: Diagnosis not present

## 2020-08-19 DIAGNOSIS — Z89612 Acquired absence of left leg above knee: Secondary | ICD-10-CM

## 2020-08-19 DIAGNOSIS — F17210 Nicotine dependence, cigarettes, uncomplicated: Secondary | ICD-10-CM | POA: Diagnosis present

## 2020-08-19 DIAGNOSIS — Z87442 Personal history of urinary calculi: Secondary | ICD-10-CM

## 2020-08-19 DIAGNOSIS — Z515 Encounter for palliative care: Secondary | ICD-10-CM

## 2020-08-19 DIAGNOSIS — Z89619 Acquired absence of unspecified leg above knee: Secondary | ICD-10-CM

## 2020-08-19 DIAGNOSIS — Z91041 Radiographic dye allergy status: Secondary | ICD-10-CM | POA: Diagnosis not present

## 2020-08-19 DIAGNOSIS — Z823 Family history of stroke: Secondary | ICD-10-CM

## 2020-08-19 DIAGNOSIS — R531 Weakness: Secondary | ICD-10-CM | POA: Diagnosis not present

## 2020-08-19 DIAGNOSIS — Z66 Do not resuscitate: Secondary | ICD-10-CM | POA: Diagnosis not present

## 2020-08-19 DIAGNOSIS — I5022 Chronic systolic (congestive) heart failure: Secondary | ICD-10-CM | POA: Diagnosis not present

## 2020-08-19 DIAGNOSIS — M160 Bilateral primary osteoarthritis of hip: Secondary | ICD-10-CM | POA: Diagnosis present

## 2020-08-19 DIAGNOSIS — Z7902 Long term (current) use of antithrombotics/antiplatelets: Secondary | ICD-10-CM | POA: Diagnosis not present

## 2020-08-19 DIAGNOSIS — K219 Gastro-esophageal reflux disease without esophagitis: Secondary | ICD-10-CM | POA: Diagnosis present

## 2020-08-19 DIAGNOSIS — K922 Gastrointestinal hemorrhage, unspecified: Secondary | ICD-10-CM | POA: Diagnosis present

## 2020-08-19 DIAGNOSIS — T879 Unspecified complications of amputation stump: Secondary | ICD-10-CM | POA: Diagnosis not present

## 2020-08-19 DIAGNOSIS — R195 Other fecal abnormalities: Secondary | ICD-10-CM | POA: Diagnosis present

## 2020-08-19 DIAGNOSIS — L89159 Pressure ulcer of sacral region, unspecified stage: Secondary | ICD-10-CM | POA: Diagnosis present

## 2020-08-19 DIAGNOSIS — I110 Hypertensive heart disease with heart failure: Secondary | ICD-10-CM | POA: Diagnosis present

## 2020-08-19 DIAGNOSIS — Z720 Tobacco use: Secondary | ICD-10-CM | POA: Diagnosis present

## 2020-08-19 DIAGNOSIS — D509 Iron deficiency anemia, unspecified: Secondary | ICD-10-CM | POA: Diagnosis present

## 2020-08-19 DIAGNOSIS — D72829 Elevated white blood cell count, unspecified: Secondary | ICD-10-CM | POA: Insufficient documentation

## 2020-08-19 DIAGNOSIS — Z7982 Long term (current) use of aspirin: Secondary | ICD-10-CM | POA: Diagnosis not present

## 2020-08-19 DIAGNOSIS — I10 Essential (primary) hypertension: Secondary | ICD-10-CM | POA: Diagnosis not present

## 2020-08-19 DIAGNOSIS — Z89611 Acquired absence of right leg above knee: Secondary | ICD-10-CM

## 2020-08-19 DIAGNOSIS — T8743 Infection of amputation stump, right lower extremity: Secondary | ICD-10-CM | POA: Diagnosis not present

## 2020-08-19 DIAGNOSIS — L03115 Cellulitis of right lower limb: Secondary | ICD-10-CM | POA: Diagnosis not present

## 2020-08-19 DIAGNOSIS — R0902 Hypoxemia: Secondary | ICD-10-CM | POA: Diagnosis not present

## 2020-08-19 DIAGNOSIS — L89150 Pressure ulcer of sacral region, unstageable: Secondary | ICD-10-CM | POA: Diagnosis not present

## 2020-08-19 DIAGNOSIS — Z79899 Other long term (current) drug therapy: Secondary | ICD-10-CM

## 2020-08-19 DIAGNOSIS — Z885 Allergy status to narcotic agent status: Secondary | ICD-10-CM

## 2020-08-19 DIAGNOSIS — Y835 Amputation of limb(s) as the cause of abnormal reaction of the patient, or of later complication, without mention of misadventure at the time of the procedure: Secondary | ICD-10-CM | POA: Diagnosis not present

## 2020-08-19 DIAGNOSIS — F32A Depression, unspecified: Secondary | ICD-10-CM | POA: Diagnosis present

## 2020-08-19 DIAGNOSIS — Z8249 Family history of ischemic heart disease and other diseases of the circulatory system: Secondary | ICD-10-CM

## 2020-08-19 DIAGNOSIS — G8929 Other chronic pain: Secondary | ICD-10-CM | POA: Diagnosis present

## 2020-08-19 DIAGNOSIS — R0689 Other abnormalities of breathing: Secondary | ICD-10-CM | POA: Diagnosis not present

## 2020-08-19 DIAGNOSIS — Z20822 Contact with and (suspected) exposure to covid-19: Secondary | ICD-10-CM | POA: Diagnosis not present

## 2020-08-19 DIAGNOSIS — L8990 Pressure ulcer of unspecified site, unspecified stage: Secondary | ICD-10-CM | POA: Diagnosis present

## 2020-08-19 DIAGNOSIS — I959 Hypotension, unspecified: Secondary | ICD-10-CM | POA: Diagnosis not present

## 2020-08-19 LAB — COMPREHENSIVE METABOLIC PANEL
ALT: 40 U/L (ref 0–44)
AST: 57 U/L — ABNORMAL HIGH (ref 15–41)
Albumin: 2.7 g/dL — ABNORMAL LOW (ref 3.5–5.0)
Alkaline Phosphatase: 91 U/L (ref 38–126)
Anion gap: 10 (ref 5–15)
BUN: 25 mg/dL — ABNORMAL HIGH (ref 8–23)
CO2: 23 mmol/L (ref 22–32)
Calcium: 9.3 mg/dL (ref 8.9–10.3)
Chloride: 104 mmol/L (ref 98–111)
Creatinine, Ser: 0.72 mg/dL (ref 0.44–1.00)
GFR, Estimated: >60 mL/min (ref >60)
Glucose, Bld: 102 mg/dL — ABNORMAL HIGH (ref 70–99)
Potassium: 4.5 mmol/L (ref 3.5–5.1)
Sodium: 137 mmol/L (ref 135–145)
Total Bilirubin: 0.4 mg/dL (ref 0.3–1.2)
Total Protein: 6.8 g/dL (ref 6.5–8.1)

## 2020-08-19 LAB — CBC WITH DIFFERENTIAL/PLATELET
Abs Immature Granulocytes: 0.16 10*3/uL — ABNORMAL HIGH (ref 0.00–0.07)
Basophils Absolute: 0 10*3/uL (ref 0.0–0.1)
Basophils Relative: 0 %
Eosinophils Absolute: 0 10*3/uL (ref 0.0–0.5)
Eosinophils Relative: 0 %
HCT: 21.6 % — ABNORMAL LOW (ref 36.0–46.0)
Hemoglobin: 6.1 g/dL — ABNORMAL LOW (ref 12.0–15.0)
Immature Granulocytes: 1 %
Lymphocytes Relative: 8 %
Lymphs Abs: 1.4 10*3/uL (ref 0.7–4.0)
MCH: 22.1 pg — ABNORMAL LOW (ref 26.0–34.0)
MCHC: 28.2 g/dL — ABNORMAL LOW (ref 30.0–36.0)
MCV: 78.3 fL — ABNORMAL LOW (ref 80.0–100.0)
Monocytes Absolute: 0.9 10*3/uL (ref 0.1–1.0)
Monocytes Relative: 5 %
Neutro Abs: 15.7 10*3/uL — ABNORMAL HIGH (ref 1.7–7.7)
Neutrophils Relative %: 86 %
Platelets: 489 10*3/uL — ABNORMAL HIGH (ref 150–400)
RBC: 2.76 MIL/uL — ABNORMAL LOW (ref 3.87–5.11)
RDW: 20.1 % — ABNORMAL HIGH (ref 11.5–15.5)
WBC: 18.2 10*3/uL — ABNORMAL HIGH (ref 4.0–10.5)
nRBC: 0 % (ref 0.0–0.2)

## 2020-08-19 LAB — PREPARE RBC (CROSSMATCH)

## 2020-08-19 LAB — URINALYSIS, COMPLETE (UACMP) WITH MICROSCOPIC
Bacteria, UA: NONE SEEN
Bilirubin Urine: NEGATIVE
Glucose, UA: NEGATIVE mg/dL
Hgb urine dipstick: NEGATIVE
Ketones, ur: NEGATIVE mg/dL
Leukocytes,Ua: NEGATIVE
Nitrite: NEGATIVE
Protein, ur: NEGATIVE mg/dL
Specific Gravity, Urine: 1.018 (ref 1.005–1.030)
Squamous Epithelial / HPF: NONE SEEN (ref 0–5)
pH: 5 (ref 5.0–8.0)

## 2020-08-19 LAB — CBC
HCT: 20.3 % — ABNORMAL LOW (ref 36.0–46.0)
HCT: 26.1 % — ABNORMAL LOW (ref 36.0–46.0)
Hemoglobin: 5.9 g/dL — ABNORMAL LOW (ref 12.0–15.0)
Hemoglobin: 7.9 g/dL — ABNORMAL LOW (ref 12.0–15.0)
MCH: 22.6 pg — ABNORMAL LOW (ref 26.0–34.0)
MCH: 24.1 pg — ABNORMAL LOW (ref 26.0–34.0)
MCHC: 29.1 g/dL — ABNORMAL LOW (ref 30.0–36.0)
MCHC: 30.3 g/dL (ref 30.0–36.0)
MCV: 77.8 fL — ABNORMAL LOW (ref 80.0–100.0)
MCV: 79.6 fL — ABNORMAL LOW (ref 80.0–100.0)
Platelets: 462 10*3/uL — ABNORMAL HIGH (ref 150–400)
Platelets: 379 10*3/uL (ref 150–400)
RBC: 2.61 MIL/uL — ABNORMAL LOW (ref 3.87–5.11)
RBC: 3.28 MIL/uL — ABNORMAL LOW (ref 3.87–5.11)
RDW: 20 % — ABNORMAL HIGH (ref 11.5–15.5)
RDW: 18.2 % — ABNORMAL HIGH (ref 11.5–15.5)
WBC: 16.8 10*3/uL — ABNORMAL HIGH (ref 4.0–10.5)
WBC: 15.8 10*3/uL — ABNORMAL HIGH (ref 4.0–10.5)
nRBC: 0 % (ref 0.0–0.2)
nRBC: 0 % (ref 0.0–0.2)

## 2020-08-19 LAB — BRAIN NATRIURETIC PEPTIDE: B Natriuretic Peptide: 140.5 pg/mL — ABNORMAL HIGH (ref 0.0–100.0)

## 2020-08-19 LAB — RESPIRATORY PANEL BY RT PCR (FLU A&B, COVID)
Influenza A by PCR: NEGATIVE
Influenza B by PCR: NEGATIVE
SARS Coronavirus 2 by RT PCR: NEGATIVE

## 2020-08-19 LAB — APTT: aPTT: 24 seconds (ref 24–36)

## 2020-08-19 LAB — LACTIC ACID, PLASMA: Lactic Acid, Venous: 1.1 mmol/L (ref 0.5–1.9)

## 2020-08-19 LAB — PROTIME-INR
INR: 1.1 (ref 0.8–1.2)
Prothrombin Time: 13.3 seconds (ref 11.4–15.2)

## 2020-08-19 MED ORDER — NICOTINE 21 MG/24HR TD PT24
21.0000 mg | MEDICATED_PATCH | Freq: Every day | TRANSDERMAL | Status: DC
  Administered 2020-08-21: 21 mg via TRANSDERMAL
  Filled 2020-08-19: qty 1

## 2020-08-19 MED ORDER — FAMOTIDINE 20 MG PO TABS
20.0000 mg | ORAL_TABLET | ORAL | Status: DC
  Filled 2020-08-19: qty 1

## 2020-08-19 MED ORDER — GABAPENTIN 600 MG PO TABS
600.0000 mg | ORAL_TABLET | Freq: Two times a day (BID) | ORAL | Status: DC
  Administered 2020-08-19: 600 mg via ORAL
  Filled 2020-08-19: qty 1

## 2020-08-19 MED ORDER — OXYCODONE-ACETAMINOPHEN 5-325 MG PO TABS
1.0000 | ORAL_TABLET | ORAL | Status: DC | PRN
  Administered 2020-08-19 – 2020-08-20 (×3): 1 via ORAL
  Filled 2020-08-19 (×3): qty 1

## 2020-08-19 MED ORDER — SODIUM CHLORIDE 0.9 % IV SOLN: INTRAVENOUS | Status: DC

## 2020-08-19 MED ORDER — FERROUS SULFATE 325 (65 FE) MG PO TABS
325.0000 mg | ORAL_TABLET | Freq: Two times a day (BID) | ORAL | Status: DC
  Administered 2020-08-20: 325 mg via ORAL
  Filled 2020-08-19: qty 1

## 2020-08-19 MED ORDER — VITAMIN D-3 125 MCG (5000 UT) PO TABS
5000.0000 [IU] | ORAL_TABLET | Freq: Every day | ORAL | Status: DC
  Administered 2020-08-20: 5000 [IU] via ORAL
  Filled 2020-08-19: qty 3

## 2020-08-19 MED ORDER — ATORVASTATIN CALCIUM 20 MG PO TABS
40.0000 mg | ORAL_TABLET | Freq: Every day | ORAL | Status: DC
  Administered 2020-08-19: 40 mg via ORAL
  Filled 2020-08-19: qty 2

## 2020-08-19 MED ORDER — SODIUM CHLORIDE 0.9% IV SOLUTION
Freq: Once | INTRAVENOUS | Status: DC
  Filled 2020-08-19: qty 250

## 2020-08-19 MED ORDER — ASPIRIN 81 MG PO CHEW
81.0000 mg | CHEWABLE_TABLET | ORAL | Status: DC
  Filled 2020-08-19: qty 1

## 2020-08-19 MED ORDER — ACETAMINOPHEN 325 MG PO TABS: 650.0000 mg | ORAL_TABLET | Freq: Four times a day (QID) | ORAL | Status: DC | PRN

## 2020-08-19 MED ORDER — MIRTAZAPINE 15 MG PO TABS
7.5000 mg | ORAL_TABLET | Freq: Every day | ORAL | Status: DC
  Administered 2020-08-19 – 2020-08-20 (×2): 7.5 mg via ORAL
  Filled 2020-08-19 (×2): qty 1

## 2020-08-19 MED ORDER — PEG 3350-KCL-NA BICARB-NACL 420 G PO SOLR
4000.0000 mL | Freq: Once | ORAL | Status: DC
  Filled 2020-08-19: qty 4000

## 2020-08-19 MED ORDER — PANTOPRAZOLE SODIUM 40 MG IV SOLR
40.0000 mg | Freq: Two times a day (BID) | INTRAVENOUS | Status: DC
  Administered 2020-08-19 – 2020-08-21 (×4): 40 mg via INTRAVENOUS
  Filled 2020-08-19 (×4): qty 40

## 2020-08-19 MED ORDER — VITAMIN B-12 100 MCG PO TABS
100.0000 ug | ORAL_TABLET | ORAL | Status: DC
  Administered 2020-08-20: 100 ug via ORAL
  Filled 2020-08-19: qty 1

## 2020-08-19 MED ORDER — ONDANSETRON HCL 4 MG/2ML IJ SOLN: 4.0000 mg | Freq: Three times a day (TID) | INTRAMUSCULAR | Status: DC | PRN

## 2020-08-19 MED ORDER — VANCOMYCIN HCL 750 MG/150ML IV SOLN
750.0000 mg | Freq: Once | INTRAVENOUS | Status: AC
  Administered 2020-08-19: 750 mg via INTRAVENOUS
  Filled 2020-08-19 (×2): qty 150

## 2020-08-19 MED ORDER — SODIUM CHLORIDE 0.9 % IV SOLN: 10.0000 mL/h | Freq: Once | INTRAVENOUS | Status: DC

## 2020-08-19 MED ORDER — FENTANYL CITRATE (PF) 100 MCG/2ML IJ SOLN
50.0000 ug | Freq: Once | INTRAMUSCULAR | Status: AC
  Administered 2020-08-19: 50 ug via INTRAVENOUS
  Filled 2020-08-19: qty 2

## 2020-08-19 MED ORDER — GABAPENTIN 300 MG PO CAPS
600.0000 mg | ORAL_CAPSULE | Freq: Two times a day (BID) | ORAL | Status: DC
  Administered 2020-08-19: 600 mg via ORAL
  Filled 2020-08-19: qty 2

## 2020-08-19 MED ORDER — MAGNESIUM HYDROXIDE 400 MG/5ML PO SUSP: 30.0000 mL | Freq: Every day | ORAL | Status: DC | PRN

## 2020-08-19 MED ORDER — VANCOMYCIN HCL IN DEXTROSE 1-5 GM/200ML-% IV SOLN: 1000.0000 mg | Freq: Once | INTRAVENOUS | Status: DC

## 2020-08-19 MED ORDER — FENTANYL CITRATE (PF) 100 MCG/2ML IJ SOLN
12.5000 ug | INTRAMUSCULAR | Status: DC | PRN
  Administered 2020-08-19 – 2020-08-20 (×3): 12.5 ug via INTRAVENOUS
  Filled 2020-08-19 (×3): qty 2

## 2020-08-19 MED ORDER — VANCOMYCIN HCL 500 MG/100ML IV SOLN
500.0000 mg | INTRAVENOUS | Status: DC
  Filled 2020-08-19: qty 100

## 2020-08-19 NOTE — Telephone Encounter (Signed)
Attempting to call PCP office with update on patient in hospital.  Though it is normal business get outgoing message that office is closed. Will attempt to reach someone in the office later. Dijuan Sleeth K. Garner Nash NP

## 2020-08-19 NOTE — Consult Note (Signed)
Whitney Minium, MD [facility]  [address], [address] [place], [place] [address] Phone: [phone] Fax : [phone]  Consultation  Referring Provider:     Dr. Clyde Lundborg Primary Care Physician:  Carlean Jews, NP Primary Gastroenterologist: Gentry Fitz         Reason for Consultation:     Anemia  Date of Admission:  08/19/2020 Date of Consultation:  08/19/2020         HPI:   [name] is a 74 y.o. female who was admitted with a change in mental status.  The patient has a history of bilateral AKA with a infection in the right leg stump.  The patient reports with her daughter that she has been told by vascular surgery that there is nothing further to do for this leg.  She has a considerable amount of pain that she is reporting at the right stump.  The patient denies any history of seeing a GI doctor and has not had a colonoscopy or EGD in the past. The patient had a history of a hemoglobin of 9.2 one month ago with 5 months ago having a hemoglobin of 11.5.  The patient was found to have 6.1 as the hemoglobin today.  The patient and daughter deny any bright red blood per rectum or black stools.  The patient also has an increased white cell count of 18.2 which is up from 13.4 last month.  The patient had a chest x-ray without any active disease seen.  Patient reports that she continues to smoke cigarettes.   Past Medical History:  Diagnosis Date  . Arthritis    right  and left hip  . Dysrhythmia   . GERD (gastroesophageal reflux disease)   . History of hiatal hernia    pt told 35 yrs ago, describes as small., causing acid reflux  . History of kidney stones    kidney stone resolved  . Hypertension   . Peripheral vascular disease (HCC)   . Pneumonia 07/14/15  . Tobacco abuse   . Unilateral small kidney    a. one functional kidney    Past Surgical History:  Procedure Laterality Date  . ABOVE KNEE LEG AMPUTATION Right 01/2015  . AMPUTATION Left 07/16/2015   Procedure: AMPUTATION  ABOVE KNEE;  Surgeon: Renford Dills, MD;  Location: [facility] ORS;  Service: Vascular;  Laterality: Left;  . AMPUTATION Left 07/23/2015   Procedure:   WOUND CLOSURE;  Surgeon: Renford Dills, MD;  Location: [facility] OR[facility];  Service: Vascular;  Laterality: Left;  . AMPUTATION Left 11/19/2015   Procedure:  ( REVISION ) AMPUTATION ABOVE KNEE ;  Surgeon: Renford Dills, MD;  Location: [facility] OR[facility];  Service: Vascular;  Laterality: Left;  . APPENDECTOMY  1969  . APPLICATION OF WOUND VAC Right 07/08/2020   Procedure: APPLICATION OF WOUND VAC;  Surgeon: Renford Dills, MD;  Location: [facility] OR[facility];  Service: Vascular;  Laterality: Right;  . BREAST BIOPSY Right 1970   neg  . ECTOPIC PREGNANCY SURGERY Right 1969  . FOREIGN BODY REMOVAL Left 07/28/2016   Procedure: FOREIGN BODY REMOVAL ADULT ( LEG );  Surgeon: Renford Dills, MD;  Location: [facility] OR[facility];  Service: Vascular;  Laterality: Left;  . PERIPHERAL VASCULAR CATHETERIZATION N/A 07/22/2015   Procedure: Abdominal Aortogram w/Lower Extremity;  Surgeon: Annice Needy, MD;  Location: [facility] INVASIVE CV [facility];  Service: Cardiovascular;  Laterality: N/A;  . PERIPHERAL VASCULAR CATHETERIZATION  07/22/2015   Procedure: Lower Extremity Intervention;  Surgeon: Marlow Baars  Wyn Quaker, MD;  Location: [facility];  Service: Cardiovascular;;  . stents left leg    . VISCERAL ANGIOGRAPHY N/A 03/02/2020   Procedure: VISCERAL ANGIOGRAPHY;  Surgeon: Annice Needy, MD;  Location: [facility];  Service: Cardiovascular;  Laterality: N/A;  . WOUND DEBRIDEMENT Right 07/08/2020   Procedure: DEBRIDEMENT WOUND;  Surgeon: Renford Dills, MD;  Location: [facility] OR[facility];  Service: Vascular;  Laterality: Right;    Prior to Admission medications   Medication Sig Start Date End Date Taking? Authorizing Provider  aspirin 81 MG tablet Take 81 mg by mouth every morning.     [provider]  atorvastatin (LIPITOR) 40 MG tablet Take 1 tablet (40 mg total) by mouth at bedtime. 05/21/20    Carlean Jews, NP  Cholecalciferol (VITAMIN D-3) 125 MCG (5000 UT) TABS Take 5,000 Units by mouth daily.    [provider]  clopidogrel (PLAVIX) 75 MG tablet Take 1 tablet (75 mg total) by mouth daily. 06/25/20   Carlean Jews, NP  Cyanocobalamin (VITAMIN B-12 PO) Take 1 tablet by mouth 3 (three) times a week.    [provider]  diphenhydrAMINE (BENADRYL) 25 mg capsule Take 1 capsule (25 mg total) by mouth as directed. Take one pill night before CT scan and one pill the morning of the scan 06/24/20   Schnier, Latina Craver, MD  famotidine (PEPCID) 20 MG tablet Take 1 tablet (20 mg total) by mouth every morning. 05/21/20   Carlean Jews, NP  ferrous sulfate 325 (65 FE) MG EC tablet Take 1 tablet (325 mg total) by mouth 2 (two) times daily. Patient taking differently: Take 325 mg by mouth 2 (two) times a week.  03/03/20 03/03/21  Arnetha Courser, MD  gabapentin (NEURONTIN) 600 MG tablet Take 1 tablet (600 mg total) by mouth 2 (two) times daily. 08/09/20   Carlean Jews, NP  lisinopril (ZESTRIL) 20 MG tablet Take 1 tablet (20 mg total) by mouth daily. 05/11/20   Carlean Jews, NP  magnesium hydroxide (MILK OF MAGNESIA) 400 MG/5ML suspension Take 30 mLs by mouth daily as needed for mild constipation.    [provider]  mirtazapine (REMERON) 7.5 MG tablet Take 1 tablet (7.5 mg total) by mouth at bedtime. 08/18/20   Carlean Jews, NP  oxyCODONE-acetaminophen (PERCOCET/ROXICET) 5-325 MG tablet Take 1 tablet by mouth every 6 (six) hours as needed. 08/18/20   Carlean Jews, NP  sulfamethoxazole-trimethoprim (BACTRIM DS) 800-160 MG tablet Take 1 tablet by mouth 2 (two) times daily. 08/09/20   Carlean Jews, NP    Family History  Problem Relation Age of Onset  . CAD Mother   . Hypertension Mother   . Stroke Mother   . CAD Father   . Hypertension Father   . Breast cancer Neg Hx      Social History   Tobacco Use  . Smoking status: Light Tobacco Smoker     Packs/day: 0.25    Years: 55.00    Pack years: 13.75    Types: Cigarettes    Last attempt to quit: 01/20/2015    Years since quitting: 5.5  . Smokeless tobacco: Never Used  Vaping Use  . Vaping Use: Never used  Substance Use Topics  . Alcohol use: No  . Drug use: No    Allergies as of 08/19/2020 - Review Complete 08/19/2020  Allergen Reaction Noted  . Contrast media [iodinated diagnostic agents] Diarrhea, Nausea And Vomiting, Swelling, and Rash 02/29/2020  .  Morphine and related Diarrhea and Nausea And Vomiting 02/13/2015    Review of Systems:    All systems reviewed and negative except where noted in HPI.   Physical Exam:  Vital signs in last 24 hours: Temp:  [99.2 F (37.3 C)] 99.2 F (37.3 C) (11/04 0857) Pulse Rate:  [87-104] 94 (11/04 1330) Resp:  [9-23] 16 (11/04 1330) BP: (113-136)/(51-100) 122/56 (11/04 1330) SpO2:  [85 %-100 %] 96 % (11/04 1330) Weight:  [36.3 kg] 36.3 kg (11/04 0858)   General:   Pleasant, patient in visible pain and cannot get comfortable in bed Head:  Normocephalic and atraumatic. Eyes:   No icterus.   Conjunctiva pink. PERRLA. Ears:  Normal auditory acuity. Neck:  Supple; no masses or thyroidomegaly Lungs: Respirations even and unlabored. Lungs clear to auscultation bilaterally.   No wheezes, crackles, or rhonchi.  Heart:  Regular rate and rhythm;  Without murmur, clicks, rubs or gallops Abdomen:  Soft, nondistended, nontender. Normal bowel sounds. No appreciable masses or hepatomegaly.  No rebound or guarding.  Rectal:  Not performed. Msk:  Symmetrical without gross deformities.    Extremities: Bilateral AKA with a bandage on her right remaining stump Neurologic:  Alert and oriented x3;  grossly normal neurologically. Skin:  Intact without significant lesions or rashes. Cervical Nodes:  No significant cervical adenopathy. Psych:  Alert and cooperative. Normal affect.  LAB RESULTS: Recent Labs    08/19/20 0910  WBC 18.2*  HGB 6.1*    HCT 21.6*  PLT 489*   BMET Recent Labs    08/19/20 0910  NA 137  K 4.5  CL 104  CO2 23  GLUCOSE 102*  BUN 25*  CREATININE 0.72  CALCIUM 9.3   LFT Recent Labs    08/19/20 0910  PROT 6.8  ALBUMIN 2.7*  AST 57*  ALT 40  ALKPHOS 91  BILITOT 0.4   PT/INR Recent Labs    08/19/20 0945  LABPROT 13.3  INR 1.1    STUDIES: DG Chest Port 1 View  Result Date: 08/19/2020 CLINICAL DATA:  Sepsis. EXAM: PORTABLE CHEST 1 VIEW COMPARISON:  November 08, 2015. FINDINGS: The heart size and mediastinal contours are within normal limits. Both lungs are clear. The visualized skeletal structures are unremarkable. IMPRESSION: No active disease. Electronically Signed   By: Lupita RaiderJames  Green Jr M.D.   On: 08/19/2020 09:23      Impression / Plan:   Assessment: Principal Problem:   GIB (gastrointestinal bleeding) Active Problems:   Chronic systolic heart failure (HCC)   Tobacco use   Essential hypertension   Status post above-knee amputation (HCC)   Gastroesophageal reflux disease without esophagitis   Leukocytosis   Morene Antuatricia S Weese is a 74 y.o. y/o female with significant anemia and was reported to have dark stools by the hospitalist.  The patient has never had an EGD and colonoscopy.  I have discussed the risks and benefits with the patient and the daughter and they voiced the desire to proceed with any invasive procedures needed to find out what is going on and why the blood count is low.  Plan:  This patient has multiple medical problems with bilateral above-knee amputations and a infected right remaining amputation site.  The patient would like to proceed with aggressive investigations as the cause of the anemia.  The patient had stool Hemoccult positive and was reported to have dark stools.  The patient will be prepped today for an EGD and colonoscopy for tomorrow.  The patient and her daughter  have been explained the plan and agree with it.  Thank you for involving me in the care  of this patient.      LOS: 0 days   Whitney Minium, MD, [facility] 08/19/2020, 2:50 PM,  Pager [phone] 7am-5pm  Check AMION for 5pm -7am coverage and on weekends   Note: This dictation was prepared with Dragon dictation along with smaller phrase technology. Any transcriptional errors that result from this process are unintentional.

## 2020-08-19 NOTE — Telephone Encounter (Signed)
This is a late entry.   Had spoken with Denisha at PCP office and had questions about who would be prescribing and why hospice wasn't doing it.  Discussed that we are palliative services and not hospice. Spoke with daughter, Dewayne Hatch, yesterday.  Had questions about medications.  Attempted to answer her questions.  Discussed decline that her mother is having.  States that she is having to be propped up to be able to sit up.  She is not eating much and they are trying to get her to take Ensure.  Discussed that she would benefit with hospice support.  She will discuss with family and get back with me.  Offered to be present during family meeting to discuss hospice if needed. Jourdon Zimmerle K. Garner Nash NP

## 2020-08-19 NOTE — ED Notes (Signed)
NPO after midnight per ultra sound

## 2020-08-19 NOTE — Telephone Encounter (Signed)
Was able to talk with [name] at PCP office to update on patient being in hospital and family wanting hospice. [name] NP

## 2020-08-19 NOTE — Consult Note (Signed)
Pharmacy Antibiotic Note  [name] is a 74 y.o. female admitted on 08/19/2020 with cellulitis. Pt presenting with AMS, dark stool, and right AKA stump infection. 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 Pharmacy has been consulted for vancomycin dosing.  Afebrile, pulse 100-110, SBP 110-130, WBC 16.8 Pre-AKA height: 5'5  SCR 0.72, using TBW and Vd 0.72 would give recommended dose 500mg  q24h w predicted AUC 524  Plan: Vancomycin 50 mg x 1 dose followed by  Vancomycin 500 mg IV every 24 hours.  Goal trough 10-15 mcg/mL.  Monitor SCr  Height:  (pt is double amputation) Weight: 36.3 kg (80 lb)  Temp (24hrs), Avg:99.1 F (37.3 C), Min:99.1 F (37.3 C), Max:99.2 F (37.3 C)  Recent Labs  Lab 08/19/20 0910 08/19/20 1204  WBC 18.2* 16.8*  CREATININE 0.72  --   LATICACIDVEN 1.1  --     Estimated Creatinine Clearance: 21.6 mL/min (by C-G formula based on SCr of 0.72 mg/dL).    Allergies  Allergen Reactions  . Contrast Media [Iodinated Diagnostic Agents] Diarrhea, Nausea And Vomiting, Swelling and Rash  . Morphine And Related Diarrhea and Nausea And Vomiting    Antimicrobials this admission: 11/4 vancomycin >>  Microbiology results: 11/4 BCx: pending 11/4 UCx: pending 11/4 viral respiratory panel: negative  Thank you for allowing pharmacy to be a part of this patient's care.  13/4, PharmD Pharmacy Resident  08/19/2020 6:10 PM

## 2020-08-19 NOTE — ED Triage Notes (Signed)
Pt arrived via ems for report of altered mental status and failure to thrive - Pt is a double amputation and the right stump is infected the wound center will not treat further because the pt has such poor circulation - Family was concerned d/t pt not arousable - Pt is A&O x4 in the ED at this time

## 2020-08-19 NOTE — Telephone Encounter (Signed)
Daughter called this morning and patient unresponsive and ambulance taking her to hospital.  She did say that family is in agreement with hospice services.  Reached out to hospital liaisons with her information.  Will reach out to PCP office as well. Maybell Misenheimer K. Garner Nash NP

## 2020-08-19 NOTE — ED Notes (Signed)
Admitting provider messaged for pain medication - provider given list that pt and her daughter state that she takes at home

## 2020-08-19 NOTE — Telephone Encounter (Signed)
Remeron was sent to pharmacy.  Noticed in pt's chart this morning that pt is at ER this morning.

## 2020-08-19 NOTE — H&P (Signed)
History and Physical    Whitney Antuatricia S Shenefield EAV:[id number] DOB: [birth year] DOA: 08/19/2020  Referring MD/NP/PA:   PCP: Carlean JewsBoscia, Heather E, NP   Patient coming from:  The patient is coming from home.  At baseline, pt is dependent for most of ADL.        Chief Complaint: AMS, dark stook, right AKA stump infection  HPI: [name] is a 74 y.o. female with medical history significant of hypertension, hyperlipidemia, GERD, depression, tobacco abuse, PVD, kidney stone, CHF with EF of 40-45%, GI bleeding, bilateral AKA with right stump infection, iron deficiency anemia, who presents with altered mental status, dark stool, right AKA stump infection.  Per patient's daughter, patient was found to be confused this morning and was not arousable.  Patient took oxycodone for pain, which was believed to have contributed by her daughter.  At arrival to ED, patient mental status has improved.  When I saw patient in ED, patient is alert, oriented x3.  No unilateral numbness or tingling, no facial droop or slurred speech.  Her mental status is back to normal.  The patient has history of bilateral AKA with infection in the right AKA stump. Her daughter states that pt was told by vascular surgery that there is nothing further to do for this leg. They are trying to go to [facility] for second opinion.   Pt was found to have Hgb dropped from 9.2 on 07/08/2020 to 6.1 in ED. Patient denies dark stool, but per ED physician who did rectal examination, the stool is dark with positive FOBT.  Patient denies nausea, vomiting, diarrhea or abdominal pain.  No chest pain, shortness breath, cough, fever or chills.  No symptoms of UTI.   ED Course: pt was found to have WBC 18.2, lactic acid 1.1, INR 1.1, PTT 24, BNP 140, negative Covid PCR, electrolytes renal function okay, temperature 99.2, tachycardia with heart rate of 100, RR 15, oxygen saturation 85% which improved to 94% on 3 L oxygen, blood pressure 115/58.  Chest x-ray  negative.  Patient is placed on MedSurg bed for observation.  Review of Systems:   General: no fevers, chills, no body weight gain, has fatigue HEENT: no blurry vision, hearing changes or sore throat Respiratory: no dyspnea, coughing, wheezing CV: no chest pain, no palpitations GI: no nausea, vomiting, abdominal pain, diarrhea, constipation GU: no dysuria, burning on urination, increased urinary frequency, hematuria  Ext: no leg edema. S/p of bilateral AKA, has open wound in the right AKA stump Neuro: no unilateral weakness, numbness, or tingling, no vision change or hearing loss. Had AMS Skin: no rash, no skin tear. MSK: No muscle spasm, no deformity, no limitation of range of movement in spin Heme: No easy bruising.  Travel history: No recent long distant travel.  Allergy:  Allergies  Allergen Reactions  . Contrast Media [Iodinated Diagnostic Agents] Diarrhea, Nausea And Vomiting, Swelling and Rash  . Morphine And Related Diarrhea and Nausea And Vomiting    Past Medical History:  Diagnosis Date  . Arthritis    right  and left hip  . Dysrhythmia   . GERD (gastroesophageal reflux disease)   . History of hiatal hernia    pt told 35 yrs ago, describes as small., causing acid reflux  . History of kidney stones    kidney stone resolved  . Hypertension   . Peripheral vascular disease (HCC)   . Pneumonia 07/14/15  . Tobacco abuse   . Unilateral small kidney    a. one functional  kidney    Past Surgical History:  Procedure Laterality Date  . ABOVE KNEE LEG AMPUTATION Right 01/2015  . AMPUTATION Left 07/16/2015   Procedure: AMPUTATION ABOVE KNEE;  Surgeon: Renford Dills, MD;  Location: [facility] ORS;  Service: Vascular;  Laterality: Left;  . AMPUTATION Left 07/23/2015   Procedure:   WOUND CLOSURE;  Surgeon: Renford Dills, MD;  Location: [facility] OR[facility];  Service: Vascular;  Laterality: Left;  . AMPUTATION Left 11/19/2015   Procedure:  ( REVISION ) AMPUTATION ABOVE KNEE ;  Surgeon:  Renford Dills, MD;  Location: [facility];  Service: Vascular;  Laterality: Left;  . APPENDECTOMY  1969  . APPLICATION OF WOUND VAC Right 07/08/2020   Procedure: APPLICATION OF WOUND VAC;  Surgeon: Renford Dills, MD;  Location: [facility] ORS;  Service: Vascular;  Laterality: Right;  . BREAST BIOPSY Right 1970   neg  . ECTOPIC PREGNANCY SURGERY Right 1969  . FOREIGN BODY REMOVAL Left 07/28/2016   Procedure: FOREIGN BODY REMOVAL ADULT ( LEG );  Surgeon: Renford Dills, MD;  Location: [facility] OR[facility];  Service: Vascular;  Laterality: Left;  . PERIPHERAL VASCULAR CATHETERIZATION N/A 07/22/2015   Procedure: Abdominal Aortogram w/Lower Extremity;  Surgeon: Annice Needy, MD;  Location: [facility];  Service: Cardiovascular;  Laterality: N/A;  . PERIPHERAL VASCULAR CATHETERIZATION  07/22/2015   Procedure: Lower Extremity Intervention;  Surgeon: Annice Needy, MD;  Location: [facility];  Service: Cardiovascular;;  . stents left leg    . VISCERAL ANGIOGRAPHY N/A 03/02/2020   Procedure: VISCERAL ANGIOGRAPHY;  Surgeon: Annice Needy, MD;  Location: [facility];  Service: Cardiovascular;  Laterality: N/A;  . WOUND DEBRIDEMENT Right 07/08/2020   Procedure: DEBRIDEMENT WOUND;  Surgeon: Renford Dills, MD;  Location: [facility];  Service: Vascular;  Laterality: Right;    Social History:  reports that she has been smoking cigarettes. She has a 13.75 pack-year smoking history. She has never used smokeless tobacco. She reports that she does not drink alcohol and does not use drugs.  Family History:  Family History  Problem Relation Age of Onset  . CAD Mother   . Hypertension Mother   . Stroke Mother   . CAD Father   . Hypertension Father   . Breast cancer Neg Hx      Prior to Admission medications   Medication Sig Start Date End Date Taking? Authorizing Provider  aspirin 81 MG tablet Take 81 mg by mouth every morning.     [provider]  atorvastatin (LIPITOR) 40 MG  tablet Take 1 tablet (40 mg total) by mouth at bedtime. 05/21/20   Carlean Jews, NP  cephALEXin (KEFLEX) 500 MG capsule Take 1 capsule (500 mg total) by mouth 3 (three) times daily. Reported on 11/08/2015 08/03/20   Carlean Jews, NP  Cholecalciferol (VITAMIN D-3) 125 MCG (5000 UT) TABS Take 5,000 Units by mouth daily.    [provider]  clopidogrel (PLAVIX) 75 MG tablet Take 1 tablet (75 mg total) by mouth daily. 06/25/20   Carlean Jews, NP  Cyanocobalamin (VITAMIN B-12 PO) Take 1 tablet by mouth 3 (three) times a week.    [provider]  diphenhydrAMINE (BENADRYL) 25 mg capsule Take 1 capsule (25 mg total) by mouth as directed. Take one pill night before CT scan and one pill the morning of the scan 06/24/20   Schnier, Latina Craver, MD  famotidine (PEPCID) 20 MG tablet Take 1 tablet (20  mg total) by mouth every morning. 05/21/20   Carlean Jews, NP  ferrous sulfate 325 (65 FE) MG EC tablet Take 1 tablet (325 mg total) by mouth 2 (two) times daily. Patient taking differently: Take 325 mg by mouth 2 (two) times a week.  03/03/20 03/03/21  Arnetha Courser, MD  gabapentin (NEURONTIN) 600 MG tablet Take 1 tablet (600 mg total) by mouth 2 (two) times daily. 08/09/20   Carlean Jews, NP  lisinopril (ZESTRIL) 20 MG tablet Take 1 tablet (20 mg total) by mouth daily. 05/11/20   Carlean Jews, NP  magnesium hydroxide (MILK OF MAGNESIA) 400 MG/5ML suspension Take 30 mLs by mouth daily as needed for mild constipation.    [provider]  mirtazapine (REMERON) 7.5 MG tablet Take 1 tablet (7.5 mg total) by mouth at bedtime. 08/18/20   Carlean Jews, NP  oxyCODONE-acetaminophen (PERCOCET/ROXICET) 5-325 MG tablet Take 1 tablet by mouth every 6 (six) hours as needed. 08/18/20   Carlean Jews, NP  sulfamethoxazole-trimethoprim (BACTRIM DS) 800-160 MG tablet Take 1 tablet by mouth 2 (two) times daily. 08/09/20   Carlean Jews, NP    Physical Exam: Vitals:   08/19/20  1430 08/19/20 1445 08/19/20 1600 08/19/20 1639  BP: (!) 124/57  (!) 129/58 (!) 110/44  Pulse: (!) 108 (!) 105 (!) 101 (!) 106  Resp:   18 18  Temp:   99.1 F (37.3 C) 99.1 F (37.3 C)  TempSrc:   Oral Oral  SpO2: 94% 92% 97% 100%  Weight:       General: Not in acute distress. Pale looking. HEENT:       Eyes: PERRL, EOMI, no scleral icterus.       ENT: No discharge from the ears and nose, no pharynx injection, no tonsillar enlargement.        Neck: No JVD, no bruit, no mass felt. Heme: No neck lymph node enlargement. Cardiac: S1/S2, RRR, No murmurs, No gallops or rubs. Respiratory: No rales, wheezing, rhonchi or rubs. GI: Soft, nondistended, nontender, no rebound pain, no organomegaly, BS present. GU: No hematuria Ext: has an open wound in right AKA stump, with dark gangrenous change with little pus on the surface.  Has tenderness.     Musculoskeletal: No joint deformities, No joint redness or warmth, no limitation of ROM in spin. Skin: No rashes.  Neuro: Alert, oriented X3, cranial nerves II-XII grossly intact, moves all extremities. Psych: Patient is not psychotic, no suicidal or hemocidal ideation.  Labs on Admission: I have personally reviewed following labs and imaging studies  CBC: Recent Labs  Lab 08/19/20 0910 08/19/20 1204  WBC 18.2* 16.8*  NEUTROABS 15.7*  --   HGB 6.1* 5.9*  HCT 21.6* 20.3*  MCV 78.3* 77.8*  PLT 489* 462*   Basic Metabolic Panel: Recent Labs  Lab 08/19/20 0910  NA 137  K 4.5  CL 104  CO2 23  GLUCOSE 102*  BUN 25*  CREATININE 0.72  CALCIUM 9.3   GFR: Estimated Creatinine Clearance: 21.6 mL/min (by C-G formula based on SCr of 0.72 mg/dL). Liver Function Tests: Recent Labs  Lab 08/19/20 0910  AST 57*  ALT 40  ALKPHOS 91  BILITOT 0.4  PROT 6.8  ALBUMIN 2.7*   No results for input(s): LIPASE, AMYLASE in the last 168 hours. No results for input(s): AMMONIA in the last 168 hours. Coagulation Profile: Recent Labs  Lab  08/19/20 0945  INR 1.1   Cardiac Enzymes: No results for input(s):  CKTOTAL, CKMB, CKMBINDEX, TROPONINI in the last 168 hours. BNP (last 3 results) No results for input(s): PROBNP in the last 8760 hours. HbA1C: No results for input(s): HGBA1C in the last 72 hours. CBG: No results for input(s): GLUCAP in the last 168 hours. Lipid Profile: No results for input(s): CHOL, HDL, LDLCALC, TRIG, CHOLHDL, LDLDIRECT in the last 72 hours. Thyroid Function Tests: No results for input(s): TSH, T4TOTAL, FREET4, T3FREE, THYROIDAB in the last 72 hours. Anemia Panel: No results for input(s): VITAMINB12, FOLATE, FERRITIN, TIBC, IRON, RETICCTPCT in the last 72 hours. Urine analysis:    Component Value Date/Time   COLORURINE YELLOW (A) 08/19/2020 1203   APPEARANCEUR CLEAR (A) 08/19/2020 1203   APPEARANCEUR Cloudy 02/12/2015 2217   LABSPEC 1.018 08/19/2020 1203   LABSPEC 1.015 02/12/2015 2217   PHURINE 5.0 08/19/2020 1203   GLUCOSEU NEGATIVE 08/19/2020 1203   GLUCOSEU Negative 02/12/2015 2217   HGBUR NEGATIVE 08/19/2020 1203   BILIRUBINUR NEGATIVE 08/19/2020 1203   BILIRUBINUR Negative 02/12/2015 2217   KETONESUR NEGATIVE 08/19/2020 1203   PROTEINUR NEGATIVE 08/19/2020 1203   NITRITE NEGATIVE 08/19/2020 1203   LEUKOCYTESUR NEGATIVE 08/19/2020 1203   LEUKOCYTESUR 3+ 02/12/2015 2217   Sepsis Labs: @LABRCNTIP (procalcitonin:4,lacticidven:4) ) Recent Results (from the past 240 hour(s))  Respiratory Panel by RT PCR (Flu A&B, Covid) - Urine, Catheterized     Status: None   Collection Time: 08/19/20  1:50 PM   Specimen: Urine, Catheterized; Nasopharyngeal  Result Value Ref Range Status   SARS Coronavirus 2 by RT PCR NEGATIVE NEGATIVE Final    Comment: (NOTE) SARS-CoV-2 target nucleic acids are NOT DETECTED.  The SARS-CoV-2 RNA is generally detectable in upper respiratoy specimens during the acute phase of infection. The lowest concentration of SARS-CoV-2 viral copies this assay can detect  is 131 copies/mL. A negative result does not preclude SARS-Cov-2 infection and should not be used as the sole basis for treatment or other patient management decisions. A negative result may occur with  improper specimen collection/handling, submission of specimen other than nasopharyngeal swab, presence of viral mutation(s) within the areas targeted by this assay, and inadequate number of viral copies (<131 copies/mL). A negative result must be combined with clinical observations, patient history, and epidemiological information. The expected result is Negative.  Fact Sheet for Patients:  13/04/21  Fact Sheet for Healthcare Providers:  https://www.moore.com/  This test is no t yet approved or cleared by the https://www.young.biz/ FDA and  has been authorized for detection and/or diagnosis of SARS-CoV-2 by FDA under an Emergency Use Authorization (EUA). This EUA will remain  in effect (meaning this test can be used) for the duration of the COVID-19 declaration under Section 564(b)(1) of the Act, 21 U.S.C. section 360bbb-3(b)(1), unless the authorization is terminated or revoked sooner.     Influenza A by PCR NEGATIVE NEGATIVE Final   Influenza B by PCR NEGATIVE NEGATIVE Final    Comment: (NOTE) The Xpert Xpress SARS-CoV-2/FLU/RSV assay is intended as an aid in  the diagnosis of influenza from Nasopharyngeal swab specimens and  should not be used as a sole basis for treatment. Nasal washings and  aspirates are unacceptable for Xpert Xpress SARS-CoV-2/FLU/RSV  testing.  Fact Sheet for Patients: [place]  Fact Sheet for Healthcare Providers: https://www.moore.com/  This test is not yet approved or cleared by the https://www.young.biz/ FDA and  has been authorized for detection and/or diagnosis of SARS-CoV-2 by  FDA under an Emergency Use Authorization (EUA). This EUA will remain  in effect  (meaning  this test can be used) for the duration of the  Covid-19 declaration under Section 564(b)(1) of the Act, 21  U.S.C. section 360bbb-3(b)(1), unless the authorization is  terminated or revoked. Performed at [facility], [address] [place], [place] [address]      Radiological Exams on Admission: DG Chest [facility] 1 View  Result Date: 08/19/2020 CLINICAL DATA:  Sepsis. EXAM: PORTABLE CHEST 1 VIEW COMPARISON:  November 08, 2015. FINDINGS: The heart size and mediastinal contours are within normal limits. Both lungs are clear. The visualized skeletal structures are unremarkable. IMPRESSION: No active disease. Electronically Signed   By: Lupita Raider M.D.   On: 08/19/2020 09:23     EKG: I have personally reviewed.  Sinus rhythm, QTC 466, low voltage, nonspecific T wave change  Assessment/Plan Principal Problem:   GIB (gastrointestinal bleeding) Active Problems:   Chronic systolic heart failure (HCC)   Tobacco use   Essential hypertension   Status post above-knee amputation (HCC)   Gastroesophageal reflux disease without esophagitis   AKA stump complication with right stump infection   Sepsis (HCC)   GIB (gastrointestinal bleeding): Hgb 9.2 -->6.1 -->5.9.  Dr. Servando Snare of GI is consulted  - will admitted to med-surg bed as inpatient - transfuse 2 units of blood - GI consulted by Ed, will follow up recommendations - NPO  - IVF: 75 mL/hr NS - Start IV pantoprazole 40 mg bid - Zofran IV for nausea - Avoid NSAIDs and SQ heparin - Maintain IV access (2 large bore IVs if possible). - Monitor closely and follow q6h cbc, transfuse as necessary, if Hgb<7.0 - LaB: INR, PTT and type screen  Sepsis due to AKA stump complication with right stump infection: pt is taking Keflex and Bactrim, but still has leukocytosis.  Patient meets criteria for sepsis with leukocytosis with WBC 18.2, tachycardia with HR 100.  Lactic acid is normal.  Currently hemodynamically stable. -will  switch to IV vancomycin -f/u Bx  -wound care consult  Chronic systolic heart failure (HCC): 2D echo on 08/20/2015 showed EF of 40-45%.  Patient does not have leg edema.  No pulmonary edema on chest x-ray.  CHF seem to be compensated.  BNP 140. -Hold volume status closely  Tobacco use -Nicotine  Essential hypertension: Blood pressure 115/58 -hold lisinopril due to sepsis  Gastroesophageal reflux disease without esophagitis -On IV Protonix as above     DVT ppx: SCD Code Status: Full code Family Communication:    Yes, patient's daughter at bed side Disposition Plan:  Anticipate discharge back to previous environment Consults called:  Dr. Servando Snare of GI Admission status: Med-surg bed for obs    Status is: Inpatient  Remains inpatient appropriate because:Inpatient level of care appropriate due to severity of illness.  Patient has multiple comorbidities, now presents with GI bleeding, altered mental status, right AKA stump infection with sepsis.  Her presentation is highly complicated.  Patient is at high risk of deteriorating.  Need to be treated in hospital for at least 2 days.   Dispo: The patient is from: Home              Anticipated d/c is to: Home              Anticipated d/c date is: 2 days              Patient currently is not medically stable to d/c.           Date of Service 08/19/2020  Lorretta Harp [facility]   If 7PM-7AM, please contact night-coverage [email or web address] 08/19/2020, 5:40 PM

## 2020-08-19 NOTE — Progress Notes (Addendum)
[facility] Liaison note:  New referral for [facility] hospice services at home. Patient is a currently followed by our community Palliative program and family has chosen for her to transition to hospice services. Patient information sent to referral. Hospice eligibility confirmed.  Writer met in the room with patient and her daughter [name], and daughter [name] via telephone conference to initiate education regarding hospice services, philosophy and team approach to care with understanding voiced. DME needs discussed and will be ordered for delivery tomorrow 11/5. Patient reporting significant pain through out the visit, this was report to staff RN and MD. Per discussion with family and EDP Dr. Corky Downs plan is for admission to treat a hemoglobin of 6.1. TOC [name] updated. Will continue to follow thorough discharge. Thank you.  Flo Shanks BSN, RN, [place] [phone]

## 2020-08-19 NOTE — ED Provider Notes (Signed)
[facility] Emergency Department Provider Note   ____________________________________________    I have reviewed the triage vital signs and the nursing notes.   HISTORY  Chief Complaint Altered Mental Status     HPI [name] is a 74 y.o. female brought in for reported altered mental status.  Apparently the patient was difficult to arouse this morning.  Per medical records family has been working with palliative care and has been considering hospice given patient's significant medical history is an apparent gradual decline over the last several weeks.  Patient reports that she was just deeply asleep this morning, does note that she took an oxycodone yesterday before going to bed which may have contributed.  She denies new pain, denies fevers.  No nausea or vomiting.  Past Medical History:  Diagnosis Date  . Arthritis    right  and left hip  . Dysrhythmia   . GERD (gastroesophageal reflux disease)   . History of hiatal hernia    pt told 35 yrs ago, describes as small., causing acid reflux  . History of kidney stones    kidney stone resolved  . Hypertension   . Peripheral vascular disease (HCC)   . Pneumonia 07/14/15  . Tobacco abuse   . 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 GIB (gastrointestinal bleeding) 08/19/2020  . Leukocytosis 08/19/2020  . Pain of amputation stump of right lower extremity (HCC) 08/18/2020  . Needs flu shot 08/18/2020  . Atherosclerosis of native arteries of the extremities with gangrene (HCC) 07/05/2020  . AKA stump complication (HCC) 06/24/2020  . Open wound multiple sites one lower limb and thigh with complication, right, initial encounter 06/22/2020  . Stump pain (HCC) 06/22/2020  . Cellulitis of right lower extremity 06/22/2020  . Palliative care patient 03/22/2020  . Allergic drug reaction 03/07/2020  . Hospital discharge follow-up  03/07/2020  . Generalized abdominal pain   . GI bleed 02/29/2020  . Acute GI bleeding 02/29/2020  . Nicotine dependence 02/29/2020  . Carotid atherosclerosis, bilateral 07/06/2019  . Left carotid bruit 09/30/2018  . Chronic pain of both lower extremities 03/11/2018  . Gastroesophageal reflux disease without esophagitis 03/11/2018  . Adjustment insomnia 03/11/2018  . Vitamin D deficiency 03/11/2018  . Carotid stenosis 03/05/2017  . Status post above-knee amputation (HCC) 07/27/2016  . Atherosclerosis of abdominal aorta (HCC) 07/27/2016  . Peripheral vascular disease (HCC) 07/27/2016  . Amputation of lower extremity above knee with complication (HCC) 11/19/2015  . Encounter for general adult medical examination with abnormal findings 11/15/2015  . Essential hypertension 11/15/2015  . Chronic systolic heart failure (HCC) 08/20/2015  . Tobacco use 08/20/2015  . Arterial occlusion 07/14/2015  . Pressure ulcer 07/14/2015  . Acute respiratory failure (HCC) 07/14/2015  . Pneumonia 02/13/2015    Past Surgical History:  Procedure Laterality Date  . ABOVE KNEE LEG AMPUTATION Right 01/2015  . AMPUTATION Left 07/16/2015   Procedure: AMPUTATION ABOVE KNEE;  Surgeon: Renford DillsGregory G Schnier, MD;  Location: [facility] ORS;  Service: Vascular;  Laterality: Left;  . AMPUTATION Left 07/23/2015   Procedure:   WOUND CLOSURE;  Surgeon: Renford DillsGregory G Schnier, MD;  Location: [facility];  Service: Vascular;  Laterality: Left;  . AMPUTATION Left 11/19/2015   Procedure:  ( REVISION ) AMPUTATION ABOVE KNEE ;  Surgeon: Renford DillsGregory G Schnier, MD;  Location: [facility];  Service: Vascular;  Laterality: Left;  . APPENDECTOMY  1969  . APPLICATION OF WOUND VAC Right 07/08/2020   Procedure: APPLICATION OF WOUND VAC;  Surgeon: Renford Dills, MD;  Location: [facility];  Service: Vascular;  Laterality: Right;  . BREAST BIOPSY Right 1970   neg  . ECTOPIC PREGNANCY SURGERY Right 1969  . FOREIGN BODY REMOVAL Left 07/28/2016   Procedure:  FOREIGN BODY REMOVAL ADULT ( LEG );  Surgeon: Renford Dills, MD;  Location: [facility] OR[facility];  Service: Vascular;  Laterality: Left;  . PERIPHERAL VASCULAR CATHETERIZATION N/A 07/22/2015   Procedure: Abdominal Aortogram w/Lower Extremity;  Surgeon: Annice Needy, MD;  Location: [facility];  Service: Cardiovascular;  Laterality: N/A;  . PERIPHERAL VASCULAR CATHETERIZATION  07/22/2015   Procedure: Lower Extremity Intervention;  Surgeon: Annice Needy, MD;  Location: [facility];  Service: Cardiovascular;;  . stents left leg    . VISCERAL ANGIOGRAPHY N/A 03/02/2020   Procedure: VISCERAL ANGIOGRAPHY;  Surgeon: Annice Needy, MD;  Location: [facility];  Service: Cardiovascular;  Laterality: N/A;  . WOUND DEBRIDEMENT Right 07/08/2020   Procedure: DEBRIDEMENT WOUND;  Surgeon: Renford Dills, MD;  Location: [facility];  Service: Vascular;  Laterality: Right;    Prior to Admission medications   Medication Sig Start Date End Date Taking? Authorizing Provider  aspirin 81 MG tablet Take 81 mg by mouth every morning.     [provider]  atorvastatin (LIPITOR) 40 MG tablet Take 1 tablet (40 mg total) by mouth at bedtime. 05/21/20   Carlean Jews, NP  Cholecalciferol (VITAMIN D-3) 125 MCG (5000 UT) TABS Take 5,000 Units by mouth daily.    [provider]  clopidogrel (PLAVIX) 75 MG tablet Take 1 tablet (75 mg total) by mouth daily. 06/25/20   Carlean Jews, NP  Cyanocobalamin (VITAMIN B-12 PO) Take 1 tablet by mouth 3 (three) times a week.    [provider]  diphenhydrAMINE (BENADRYL) 25 mg capsule Take 1 capsule (25 mg total) by mouth as directed. Take one pill night before CT scan and one pill the morning of the scan 06/24/20   Schnier, Latina Craver, MD  famotidine (PEPCID) 20 MG tablet Take 1 tablet (20 mg total) by mouth every morning. 05/21/20   Carlean Jews, NP  ferrous sulfate 325 (65 FE) MG EC tablet Take 1 tablet (325 mg total) by mouth 2 (two) times  daily. Patient taking differently: Take 325 mg by mouth 2 (two) times a week.  03/03/20 03/03/21  Arnetha Courser, MD  gabapentin (NEURONTIN) 600 MG tablet Take 1 tablet (600 mg total) by mouth 2 (two) times daily. 08/09/20   Carlean Jews, NP  lisinopril (ZESTRIL) 20 MG tablet Take 1 tablet (20 mg total) by mouth daily. 05/11/20   Carlean Jews, NP  magnesium hydroxide (MILK OF MAGNESIA) 400 MG/5ML suspension Take 30 mLs by mouth daily as needed for mild constipation.    [provider]  mirtazapine (REMERON) 7.5 MG tablet Take 1 tablet (7.5 mg total) by mouth at bedtime. 08/18/20   Carlean Jews, NP  oxyCODONE-acetaminophen (PERCOCET/ROXICET) 5-325 MG tablet Take 1 tablet by mouth every 6 (six) hours as needed. 08/18/20   Carlean Jews, NP  sulfamethoxazole-trimethoprim (BACTRIM DS) 800-160 MG tablet Take 1 tablet by mouth 2 (two) times daily. 08/09/20   Carlean Jews, NP     Allergies Contrast media [iodinated diagnostic agents] and Morphine and related  Family History  Problem Relation Age of Onset  .  CAD Mother   . Hypertension Mother   . Stroke Mother   . CAD Father   . Hypertension Father   . Breast cancer Neg Hx     Social History Social History   Tobacco Use  . Smoking status: Light Tobacco Smoker    Packs/day: 0.25    Years: 55.00    Pack years: 13.75    Types: Cigarettes    Last attempt to quit: 01/20/2015    Years since quitting: 5.5  . Smokeless tobacco: Never Used  Vaping Use  . Vaping Use: Never used  Substance Use Topics  . Alcohol use: No  . Drug use: No    Review of Systems  Constitutional: No reports of fever Eyes: No visual changes.  ENT: Denies difficulty swallowing Cardiovascular: Denies chest pain. Respiratory: No cough Gastrointestinal: No abdominal pain.  No nausea, no vomiting.   Genitourinary: Negative for dysuria. Musculoskeletal: Negative for back pain. Skin: known gangrenous wound to right aka stump Neurological:  Negative for headaches   ____________________________________________   PHYSICAL EXAM:  VITAL SIGNS: ED Triage Vitals  Enc Vitals Group     BP 08/19/20 0857 (!) 115/58     Pulse Rate 08/19/20 0857 100     Resp 08/19/20 0857 15     Temp 08/19/20 0857 99.2 F (37.3 C)     Temp Source 08/19/20 0857 Axillary     SpO2 08/19/20 0856 (S) (!) 85 %     Weight 08/19/20 0858 36.3 kg (80 lb)     Height --      Head Circumference --      Peak Flow --      Pain Score --      Pain Loc --      Pain Edu? --      Excl. in GC? --     Constitutional: Alert and oriented. No acute distress.  Eyes: Conjunctivae are normal.  Head: Atraumatic. Nose: No congestion/rhinnorhea.  Neck:  Painless ROM Cardiovascular: Normal rate, regular rhythm. Grossly normal heart sounds.  Good peripheral circulation. Respiratory: Normal respiratory effort.  No retractions. Lungs CTAB. Gastrointestinal: Soft and nontender. No distention.  No CVA tenderness.  Musculoskeletal: Bilateral AKA, right stump with large gangrenous wound to the medial aspect Neurologic:  Normal speech and language. No gross focal neurologic deficits are appreciated.  Skin:  Skin is warm, dry, see above Psychiatric: Mood and affect are normal. Speech and behavior are normal.  ____________________________________________   LABS (all labs ordered are listed, but only abnormal results are displayed)  Labs Reviewed  COMPREHENSIVE METABOLIC PANEL - Abnormal; Notable for the following components:      Result Value   Glucose, Bld 102 (*)    BUN 25 (*)    Albumin 2.7 (*)    AST 57 (*)    All other components within normal limits  CBC WITH DIFFERENTIAL/PLATELET - Abnormal; Notable for the following components:   WBC 18.2 (*)    RBC 2.76 (*)    Hemoglobin 6.1 (*)    HCT 21.6 (*)    MCV 78.3 (*)    MCH 22.1 (*)    MCHC 28.2 (*)    RDW 20.1 (*)    Platelets 489 (*)    Neutro Abs 15.7 (*)    Abs Immature Granulocytes 0.16 (*)    All  other components within normal limits  URINALYSIS, COMPLETE (UACMP) WITH MICROSCOPIC - Abnormal; Notable for the following components:   Color, Urine YELLOW (*)  APPearance CLEAR (*)    All other components within normal limits  BRAIN NATRIURETIC PEPTIDE - Abnormal; Notable for the following components:   B Natriuretic Peptide 140.5 (*)    All other components within normal limits  CULTURE, BLOOD (SINGLE)  URINE CULTURE  RESPIRATORY PANEL BY RT PCR (FLU A&B, COVID)  LACTIC ACID, PLASMA  PROTIME-INR  APTT  LACTIC ACID, PLASMA  CBC  CBC  CBC  PREPARE RBC (CROSSMATCH)  TYPE AND SCREEN   ____________________________________________  EKG  ED ECG REPORT I, Jene Every, the attending physician, personally viewed and interpreted this ECG.  Date: 08/19/2020  Rhythm: normal sinus rhythm QRS Axis: normal Intervals: normal ST/T Wave abnormalities: normal Narrative Interpretation: no evidence of acute ischemia  ____________________________________________  RADIOLOGY  Chest x-ray viewed by me, no infiltrate or effusion ____________________________________________   PROCEDURES  Procedure(s) performed: No  Procedures   Critical Care performed: yes  CRITICAL CARE Performed by: Jene Every   Total critical care time:35 minutes  Critical care time was exclusive of separately billable procedures and treating other patients.  Critical care was necessary to treat or prevent imminent or life-threatening deterioration.  Critical care was time spent personally by me on the following activities: development of treatment plan with patient and/or surrogate as well as nursing, discussions with consultants, evaluation of patient's response to treatment, examination of patient, obtaining history from patient or surrogate, ordering and performing treatments and interventions, ordering and review of laboratory studies, ordering and review of radiographic studies, pulse oximetry  and re-evaluation of patient's condition.  ____________________________________________   INITIAL IMPRESSION / ASSESSMENT AND PLAN / ED COURSE  Pertinent labs & imaging results that were available during my care of the patient were reviewed by me and considered in my medical decision making (see chart for details).  Patient presents for reported altered mental status, seems to be alert and oriented at this time.  Review of medical records demonstrates apparent decline documented by family and PCP over the last several weeks, has been cared for by palliative care and family is interested in hospice, given those notes I did contact the hospice coordinator who will see the patient as well.  We will evaluate the patient for sepsis, other electrolyte normalities, infection/pneumonia, UTI.  Lab work notable for normal lactic acid, elevated white blood cell count however this seems somewhat chronic for her.  Notably she has a hemoglobin of 6.1.  Able to verify dark stool guaiac positive per nurse.  She is on Plavix reportedly, apparently not on Eliquis at this time per patient.  I suspect her anemia may be contributing to her decreased responsiveness this morning.  Family would like to proceed with PRBCs and admission    ____________________________________________   FINAL CLINICAL IMPRESSION(S) / ED DIAGNOSES  Final diagnoses:  Gastrointestinal hemorrhage, unspecified gastrointestinal hemorrhage type        Note:  This document was prepared using Dragon voice recognition software and may include unintentional dictation errors.   Jene Every, MD 08/19/20 1245

## 2020-08-19 NOTE — Telephone Encounter (Signed)
Thank you for letting me know

## 2020-08-20 ENCOUNTER — Encounter: Payer: Self-pay | Admitting: Anesthesiology

## 2020-08-20 ENCOUNTER — Other Ambulatory Visit: Payer: Self-pay

## 2020-08-20 ENCOUNTER — Encounter
Admission: EM | Disposition: A | Discharge status: Hospice | Payer: Self-pay | Source: Home / Self Care | Attending: Internal Medicine

## 2020-08-20 DIAGNOSIS — A419 Sepsis, unspecified organism: Secondary | ICD-10-CM

## 2020-08-20 DIAGNOSIS — T879 Unspecified complications of amputation stump: Secondary | ICD-10-CM

## 2020-08-20 DIAGNOSIS — L89150 Pressure ulcer of sacral region, unstageable: Secondary | ICD-10-CM

## 2020-08-20 LAB — BPAM RBC
Blood Product Expiration Date: 202112042359
Blood Product Expiration Date: 202112042359
ISSUE DATE / TIME: 202111041622
ISSUE DATE / TIME: 202111042253
Unit Type and Rh: 5100
Unit Type and Rh: 5100

## 2020-08-20 LAB — CBC
HCT: 31.6 % — ABNORMAL LOW (ref 36.0–46.0)
HCT: 33.3 % — ABNORMAL LOW (ref 36.0–46.0)
Hemoglobin: 9.8 g/dL — ABNORMAL LOW (ref 12.0–15.0)
Hemoglobin: 10.4 g/dL — ABNORMAL LOW (ref 12.0–15.0)
MCH: 24.7 pg — ABNORMAL LOW (ref 26.0–34.0)
MCH: 25.2 pg — ABNORMAL LOW (ref 26.0–34.0)
MCHC: 31 g/dL (ref 30.0–36.0)
MCHC: 31.2 g/dL (ref 30.0–36.0)
MCV: 79.8 fL — ABNORMAL LOW (ref 80.0–100.0)
MCV: 80.8 fL (ref 80.0–100.0)
Platelets: 370 10*3/uL (ref 150–400)
Platelets: 376 10*3/uL (ref 150–400)
RBC: 3.96 MIL/uL (ref 3.87–5.11)
RBC: 4.12 MIL/uL (ref 3.87–5.11)
RDW: 17.8 % — ABNORMAL HIGH (ref 11.5–15.5)
RDW: 18 % — ABNORMAL HIGH (ref 11.5–15.5)
WBC: 14.4 10*3/uL — ABNORMAL HIGH (ref 4.0–10.5)
WBC: 17.9 10*3/uL — ABNORMAL HIGH (ref 4.0–10.5)
nRBC: 0 % (ref 0.0–0.2)
nRBC: 0 % (ref 0.0–0.2)

## 2020-08-20 LAB — BASIC METABOLIC PANEL
Anion gap: 9 (ref 5–15)
BUN: 22 mg/dL (ref 8–23)
CO2: 21 mmol/L — ABNORMAL LOW (ref 22–32)
Calcium: 9 mg/dL (ref 8.9–10.3)
Chloride: 104 mmol/L (ref 98–111)
Creatinine, Ser: 0.7 mg/dL (ref 0.44–1.00)
GFR, Estimated: >60 mL/min (ref >60)
Glucose, Bld: 81 mg/dL (ref 70–99)
Potassium: 4 mmol/L (ref 3.5–5.1)
Sodium: 134 mmol/L — ABNORMAL LOW (ref 135–145)

## 2020-08-20 LAB — TYPE AND SCREEN
ABO/RH(D): O POS
Antibody Screen: NEGATIVE
Unit division: 0
Unit division: 0

## 2020-08-20 LAB — URINE CULTURE: Culture: <10000 — AB

## 2020-08-20 LAB — GLUCOSE, CAPILLARY
Glucose-Capillary: 69 mg/dL — ABNORMAL LOW (ref 70–99)
Glucose-Capillary: 193 mg/dL — ABNORMAL HIGH (ref 70–99)

## 2020-08-20 SURGERY — EGD (ESOPHAGOGASTRODUODENOSCOPY)
Anesthesia: General

## 2020-08-20 MED ORDER — GABAPENTIN 600 MG PO TABS
600.0000 mg | ORAL_TABLET | Freq: Three times a day (TID) | ORAL | Status: DC
  Administered 2020-08-20 – 2020-08-21 (×4): 600 mg via ORAL
  Filled 2020-08-20 (×4): qty 1

## 2020-08-20 MED ORDER — DAKINS (1/4 STRENGTH) 0.125 % EX SOLN
Freq: Every day | CUTANEOUS | Status: DC
  Filled 2020-08-20: qty 473

## 2020-08-20 MED ORDER — KETOROLAC TROMETHAMINE 15 MG/ML IJ SOLN: 15.0000 mg | Freq: Once | INTRAMUSCULAR | Status: DC

## 2020-08-20 MED ORDER — VANCOMYCIN HCL 500 MG/100ML IV SOLN
500.0000 mg | INTRAVENOUS | Status: DC
  Filled 2020-08-20: qty 100

## 2020-08-20 MED ORDER — DEXTROSE 50 % IV SOLN
1.0000 | Freq: Once | INTRAVENOUS | Status: AC
  Administered 2020-08-20: 50 mL via INTRAVENOUS
  Filled 2020-08-20: qty 50

## 2020-08-20 MED ORDER — KETOROLAC TROMETHAMINE 15 MG/ML IJ SOLN
15.0000 mg | Freq: Four times a day (QID) | INTRAMUSCULAR | Status: DC
  Administered 2020-08-20 – 2020-08-21 (×4): 15 mg via INTRAVENOUS
  Filled 2020-08-20 (×4): qty 1

## 2020-08-20 MED ORDER — HYDROMORPHONE HCL 1 MG/ML IJ SOLN
1.0000 mg | INTRAMUSCULAR | Status: DC | PRN
  Filled 2020-08-20: qty 1

## 2020-08-20 MED ORDER — KETOROLAC TROMETHAMINE 15 MG/ML IJ SOLN: 15.0000 mg | Freq: Four times a day (QID) | INTRAMUSCULAR | Status: DC

## 2020-08-20 MED ORDER — HYDROMORPHONE HCL 1 MG/ML IJ SOLN
1.5000 mg | INTRAMUSCULAR | Status: DC | PRN
  Administered 2020-08-20 – 2020-08-21 (×6): 1.5 mg via INTRAVENOUS
  Filled 2020-08-20 (×6): qty 1.5

## 2020-08-20 NOTE — TOC Progression Note (Addendum)
Transition of Care [facility]) - Progression Note    Patient Details  Name: [name] MRN: [id number] Date of Birth: [birth year]  [facility] CM/SW Contact  Chapman Fitch, RN Phone Number: 08/20/2020, 1:37 PM  Clinical Narrative:     Family now considering residential hospice.  Clydie Braun with [employer] contracting to follow up with family today.  If they decide this is the route they want to take there should be a bed available tomorrow     Update:  Plan for residential hospice tomorrow   Expected Discharge Plan and Services                                                 Social Determinants of Health (SDOH) Interventions    Readmission Risk Interventions No flowsheet data found.

## 2020-08-20 NOTE — Progress Notes (Signed)
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  [place] hemoglobin 6.1.  Transfused 2 units with appropriate rise in hemoglobin.  GI was consulted from admission and recommended EGD and colonoscopy for identification of bleeding source.  08/20/2020: I saw and evaluated patient this morning.  Patient's daughter was at bedside.  Remainder of family were communicated with via phone.  Patient is in substantial amount of pain.  She is seemingly unresponsive to intravenous doses of fentanyl and Dilaudid.  I had a long discussion with the patient and the family.  Recommended hospice/comfort measures.  Also recommended DNR status to which they are in agreement.  Regarding colonoscopy and endoscopy I explained that while the bleeding source may be identified and stopped it is likely that procedure such as this would not result in improve quality of life.  I did left the patient room and gave family time to come to a decision.  Was later informed that they elected to defer endoscopic evaluation at this time.  I have notified the gastroenterologist.  Currently plan is to discharge within the next 24 hours with hospice services.  The decision is whether to discharge home or to discharge to hospice facility.  Inpatient hospice liaison Dayna Barker aware and we are finding a appropriate disposition plan.   Assessment & Plan:   Principal Problem:   GIB (gastrointestinal bleeding) Active Problems:   Pressure injury of skin   Chronic systolic heart failure (HCC)   Tobacco use   Essential hypertension   Status post above-knee amputation (HCC)    Gastroesophageal reflux disease without esophagitis   AKA stump complication with right stump infection   Sepsis (HCC)  Intractable pain Likely secondary to infected stump and sacral wounds Patient is in severe pain requiring multiple doses of IV narcotic with minimal effect We will attempt to optimize pain regimen is much as possible Currently requiring IV Dilaudid every 2 hours In my opinion patient may be appropriate for residential hospice care as she is nearly at total assist at home and her needs would likely be too high for the family to handle.  If we are able to wean her to oral narcotics then home is a possible option though I feel that an inpatient hospice facility would be optimal at this time   GIB (gastrointestinal bleeding):  Hgb 9.2 -->6.1 -->5.9.   Dr. Servando Snare of GI is consulted Transfused 2 units packed red cells Hemoglobin improved to 9.8 After repeat discussions endoscopic evaluation is deferred at this time Plan: Can advance diet as tolerated Continue twice daily Protonix Antiemetics as needed Patient is discharging home with hospice will recommend discontinuation of dual antiplatelet therapy  Sepsis due to AKA stump complication with right stump infection:  pt is taking Keflex and Bactrim, but still has leukocytosis.   Patient meets criteria for sepsis with leukocytosis with WBC 18.2, tachycardia with HR 100.   Patient has sacral ulcers as well as a stump infection all potential sources for sepsis  Currently hemodynamically stable. Plan: Continue IV antibiotics for now Follow cultures while admitted Appreciate wound care evaluation  Chronic systolic heart failure (HCC)  2D echo on 08/20/2015 showed EF of 40-45%.   Patient does  not have leg edema.  No pulmonary edema on chest x-ray.   CHF seem to be compensated.  BNP 140. -Monitor volume status closely  Tobacco use -Nicotine patch  Essential hypertension  Blood pressure 115/58 -hold lisinopril due to  sepsis  Gastroesophageal reflux disease without esophagitis -On IV Protonix as above   DVT prophylaxis: SCD Code Status: DNR Family Communication: Daughter at bedside, other family members via phone Disposition Plan:Status is: Inpatient  Remains inpatient appropriate because:Ongoing active pain requiring inpatient pain management   Dispo: The patient is from: Home              Anticipated d/c is to: Residential hospice facility              Anticipated d/c date is: 1 day              Patient currently is not medically stable to d/c.   Consultants:  GI Procedures:   None  Antimicrobials:  Vancomycin   Subjective: Seen and examined.  In severe pain.  Minimal effect from fentanyl and Dilaudid  Objective: Vitals:   08/20/20 0600 08/20/20 0825 08/20/20 0852 08/20/20 1156  BP:  (!) 121/109 (!) 133/57 135/67  Pulse:  91 94 82  Resp:  18  15  Temp:    99.1 F (37.3 C)  TempSrc:    Oral  SpO2:    94%  Weight: 35.4 kg     Height:        Intake/Output Summary (Last 24 hours) at 08/20/2020 1239 Last data filed at 08/20/2020 0800 Gross per 24 hour  Intake 1816.1 ml  Output --  Net 1816.1 ml   Filed Weights   08/19/20 0858 08/20/20 0600  Weight: 36.3 kg 35.4 kg    Examination:  General exam: Uncomfortable.  Appears in pain. Respiratory system: Clear to auscultation. Respiratory effort normal.  Cardiovascular system: S1 & S2 heard, RRR. No JVD, murmurs, rubs, gallops or clicks. No pedal edema. Gastrointestinal system: Abdomen is nondistended, soft and nontender. No organomegaly or masses felt. Normal bowel sounds heard. Central nervous system: Alert and oriented. No focal neurological deficits. Extremities: Symmetric 5 x 5 power. Skin: No rashes, lesions or ulcers Psychiatry: Judgement and insight appear normal. Mood & affect appropriate.     Data Reviewed: I have personally reviewed following labs and imaging studies  CBC: Recent Labs  Lab 08/19/20 0910  08/19/20 1204 08/19/20 1922 08/20/20 0402  WBC 18.2* 16.8* 15.8* 14.4*  NEUTROABS 15.7*  --   --   --   HGB 6.1* 5.9* 7.9* 9.8*  HCT 21.6* 20.3* 26.1* 31.6*  MCV 78.3* 77.8* 79.6* 79.8*  PLT 489* 462* 379 370   Basic Metabolic Panel: Recent Labs  Lab 08/19/20 0910 08/20/20 0402  NA 137 134*  K 4.5 4.0  CL 104 104  CO2 23 21*  GLUCOSE 102* 81  BUN 25* 22  CREATININE 0.72 0.70  CALCIUM 9.3 9.0   GFR: Estimated Creatinine Clearance: 27.8 mL/min (by C-G formula based on SCr of 0.7 mg/dL). Liver Function Tests: Recent Labs  Lab 08/19/20 0910  AST 57*  ALT 40  ALKPHOS 91  BILITOT 0.4  PROT 6.8  ALBUMIN 2.7*   No results for input(s): LIPASE, AMYLASE in the last 168 hours. No results for input(s): AMMONIA in the last 168 hours. Coagulation Profile: Recent Labs  Lab 08/19/20 0945  INR 1.1   Cardiac Enzymes: No results for input(s): CKTOTAL, CKMB, CKMBINDEX, TROPONINI in the last 168 hours. BNP (  last 3 results) No results for input(s): PROBNP in the last 8760 hours. HbA1C: No results for input(s): HGBA1C in the last 72 hours. CBG: Recent Labs  Lab 08/20/20 0759 08/20/20 0850  GLUCAP 69* 193*   Lipid Profile: No results for input(s): CHOL, HDL, LDLCALC, TRIG, CHOLHDL, LDLDIRECT in the last 72 hours. Thyroid Function Tests: No results for input(s): TSH, T4TOTAL, FREET4, T3FREE, THYROIDAB in the last 72 hours. Anemia Panel: No results for input(s): VITAMINB12, FOLATE, FERRITIN, TIBC, IRON, RETICCTPCT in the last 72 hours. Sepsis Labs: Recent Labs  Lab 08/19/20 0910  LATICACIDVEN 1.1    Recent Results (from the past 240 hour(s))  Blood culture (routine single)     Status: None (Preliminary result)   Collection Time: 08/19/20  9:10 AM   Specimen: BLOOD  Result Value Ref Range Status   Specimen Description BLOOD BLOOD RIGHT FOREARM  Final   Special Requests   Final    BOTTLES DRAWN AEROBIC AND ANAEROBIC Blood Culture results may not be optimal due to an  excessive volume of blood received in culture bottles   Culture   Final    NO GROWTH < 24 HOURS Performed at [facility], [address], [place], [place] [address]    Report Status PENDING  Incomplete  Respiratory Panel by RT PCR (Flu A&B, Covid) - Urine, Catheterized     Status: None   Collection Time: 08/19/20  1:50 PM   Specimen: Urine, Catheterized; Nasopharyngeal  Result Value Ref Range Status   SARS Coronavirus 2 by RT PCR NEGATIVE NEGATIVE Final    Comment: (NOTE) SARS-CoV-2 target nucleic acids are NOT DETECTED.  The SARS-CoV-2 RNA is generally detectable in upper respiratoy specimens during the acute phase of infection. The lowest concentration of SARS-CoV-2 viral copies this assay can detect is 131 copies/mL. A negative result does not preclude SARS-Cov-2 infection and should not be used as the sole basis for treatment or other patient management decisions. A negative result may occur with  improper specimen collection/handling, submission of specimen other than nasopharyngeal swab, presence of viral mutation(s) within the areas targeted by this assay, and inadequate number of viral copies (<131 copies/mL). A negative result must be combined with clinical observations, patient history, and epidemiological information. The expected result is Negative.  Fact Sheet for Patients:  https://www.moore.com/  Fact Sheet for Healthcare Providers:  https://www.young.biz/  This test is no t yet approved or cleared by the [place] FDA and  has been authorized for detection and/or diagnosis of SARS-CoV-2 by FDA under an Emergency Use Authorization (EUA). This EUA will remain  in effect (meaning this test can be used) for the duration of the COVID-19 declaration under Section 564(b)(1) of the Act, 21 U.S.C. section 360bbb-3(b)(1), unless the authorization is terminated or revoked sooner.     Influenza A by PCR NEGATIVE  NEGATIVE Final   Influenza B by PCR NEGATIVE NEGATIVE Final    Comment: (NOTE) The Xpert Xpress SARS-CoV-2/FLU/RSV assay is intended as an aid in  the diagnosis of influenza from Nasopharyngeal swab specimens and  should not be used as a sole basis for treatment. Nasal washings and  aspirates are unacceptable for Xpert Xpress SARS-CoV-2/FLU/RSV  testing.  Fact Sheet for Patients: https://www.moore.com/  Fact Sheet for Healthcare Providers: https://www.young.biz/  This test is not yet approved or cleared by the [place] FDA and  has been authorized for detection and/or diagnosis of SARS-CoV-2 by  FDA under an Emergency Use Authorization (EUA). This EUA will remain  in effect (meaning this test can be used) for the duration of the  Covid-19 declaration under Section 564(b)(1) of the Act, 21  U.S.C. section 360bbb-3(b)(1), unless the authorization is  terminated or revoked. Performed at [facility], [address] [place], [place] [address]          Radiology Studies: [facility] Chest [place] 1 View  Result Date: 08/19/2020 CLINICAL DATA:  Sepsis. EXAM: PORTABLE CHEST 1 VIEW COMPARISON:  November 08, 2015. FINDINGS: The heart size and mediastinal contours are within normal limits. Both lungs are clear. The visualized skeletal structures are unremarkable. IMPRESSION: No active disease. Electronically Signed   By: Lupita Raider M.D.   On: 08/19/2020 09:23        Scheduled Meds: . sodium chloride   Intravenous Once  . aspirin  81 mg Oral BH-q7a  . atorvastatin  40 mg Oral QHS  . famotidine  20 mg Oral BH-q7a  . ferrous sulfate  325 mg Oral BID  . gabapentin  600 mg Oral TID  . ketorolac  15 mg Intravenous Q6H  . mirtazapine  7.5 mg Oral QHS  . nicotine  21 mg Transdermal Daily  . pantoprazole (PROTONIX) IV  40 mg Intravenous Q12H  . sodium hypochlorite   Irrigation Daily  . vitamin B-12  100 mcg Oral Once per day on Mon Wed  Fri  . Vitamin D-3  5,000 Units Oral Daily   Continuous Infusions: . sodium chloride    . [START ON 08/21/2020] vancomycin       LOS: 1 day    Time spent: 25 minutes    Tresa Moore, MD [facility] Pager [phone]  If 7PM-7AM, please contact night-coverage 08/20/2020, 12:39 PM

## 2020-08-20 NOTE — Consult Note (Signed)
WOC Nurse Consult Note: Reason for Consult:Right ischemic stump from AKA.  Recent debridement by Dr Lorretta Harp.  Will implement maintenance topical treatment.  With less than optimal perfusion, hemoglobin and albumin, healing is not likely.  IS on vancomycin for cellulitis.  Wound type:surgical, nonhealing, infectious Pressure Injury POA: NA Measurement:15 cm x 18 cm necrotic wound bed Wound MMH:[id number] Drainage (amount, consistency, odor) dry, none Periwound:erythema, tender to touch Dressing procedure/placement/frequency: Cleanse right AKA stump with NS and pat dry. Apply Dakins moist kerlix.  Cover with dry dressing and ABD pads, kerlix and tape.  Change daily.  Will request vascular team re-evaluate,   Will not follow at this time.  Please re-consult if needed.  Maple Hudson MSN, RN, FNP-BC CWON Wound, Ostomy, Continence Nurse Pager [phone]

## 2020-08-20 NOTE — Consult Note (Signed)
Pharmacy Antibiotic Note  [name] is a 74 y.o. female admitted on 08/19/2020 with cellulitis. Pt presenting with AMS, dark stool, and right AKA stump infection. 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 Pharmacy was consulted for vancomycin dosing.   Plan: adjust vancomycin dose to 500 mg IV every 36 hours  Ke: 0.024 h-1, T1/2: 28.7 h  Css (calculated): 34.2 / 14.5 mcg/mL  Assess renal function daily while on vancomycin  Levels as clinically necessary  Height: 4\' 3"  (129.5 cm) Weight: 35.4 kg (78 lb 0.7 oz) IBW/kg (Calculated) : 24.8  Temp (24hrs), Avg:98.4 F (36.9 C), Min:97.5 F (36.4 C), Max:99.2 F (37.3 C)  Recent Labs  Lab 08/19/20 0910 08/19/20 1204 08/19/20 1922 08/20/20 0402  WBC 18.2* 16.8* 15.8* 14.4*  CREATININE 0.72  --   --  0.70  LATICACIDVEN 1.1  --   --   --     Estimated Creatinine Clearance: 27.8 mL/min (by C-G formula based on SCr of 0.7 mg/dL).    Allergies  Allergen Reactions  . Contrast Media [Iodinated Diagnostic Agents] Diarrhea, Nausea And Vomiting, Swelling and Rash  . Morphine And Related Diarrhea and Nausea And Vomiting    Antimicrobials this admission: 11/4 vancomycin >>  Microbiology results: 11/4 BCx: NG < 24h 11/4 UCx: pending 11/4 viral respiratory panel: negative  Thank you for allowing pharmacy to be a part of this patient's care.  13/4, PharmD Pharmacy Resident  08/20/2020 7:04 AM

## 2020-08-20 NOTE — TOC Progression Note (Signed)
Transition of Care [facility]) - Progression Note    Patient Details  Name: [name] MRN: [id number] Date of Birth: 1946-05-21  [facility] CM/SW Contact  Beverly Sessions, RN Phone Number: 08/20/2020, 10:27 AM  Clinical Narrative:     Met with patient and daughter at bedside.  Patient in quite of pain at this time.  MD and RN aware Confirmed with patient that the plan is to discharge home with hospice services through [employer].  Santiago Glad with [employer] aware and has already ordered DME  Patient changed to DNR.  Signed and on chart        Expected Discharge Plan and Services                                                 Social Determinants of Health (SDOH) Interventions    Readmission Risk Interventions No flowsheet data found.

## 2020-08-20 NOTE — Progress Notes (Signed)
The patient was seen yesterday and was planned for an EGD and colonoscopy due to anemia and a suspected GI bleed.  The patient has multiple medical problems including vascular compromise with a bilateral AKA and chronic pain in her right visual stump.  The patient and her family have refused any further GI intervention and hospice care is now involved.  Nothing further to do from a GI point of view.  I will sign off.  Please call if any further GI concerns or questions.  We would like to thank you for the opportunity to participate in the care of [name].

## 2020-08-20 NOTE — Progress Notes (Signed)
[facility] hospital liaison noteRunner, broadcasting/film/video met with patient and her family to discuss discharge options. They have chosen not to have any further interventions with GI. IV antibiotics will be discontinued and focus will be on pain management/comfort with transition to the hospice home tomorrow 11/6. Hospital care team updated. Patient will require non-emergent transport with signed DNR in place. The weekend hospice home staff with contact the hospital care team to set up discharge time tomorrow and to receive report..  Please fax discharge summary to [phone] when ready. Thank you. Flo Shanks BSN, RN, [place]  [phone]

## 2020-08-21 LAB — GLUCOSE, CAPILLARY: Glucose-Capillary: 72 mg/dL (ref 70–99)

## 2020-08-21 MED ORDER — SULFAMETHOXAZOLE-TRIMETHOPRIM 800-160 MG PO TABS: 1.0000 | ORAL_TABLET | Freq: Two times a day (BID) | ORAL | 0 refills | Status: AC

## 2020-08-21 NOTE — Discharge Summary (Signed)
Physician Discharge Summary  [name]:[id number] DOB: Nov 08, 1945 DOA: 08/19/2020  PCP: Carlean Jews, NP  Admit date: 08/19/2020 Discharge date: 08/21/2020  Admitted From: Home Disposition: Residential hospice  Recommendations for Outpatient Follow-up:  1. 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  [place] hemoglobin 6.1.  Transfused 2 units with appropriate rise in hemoglobin.  GI was consulted from admission and recommended EGD and colonoscopy for identification of bleeding source.  08/20/2020: I saw and evaluated patient this morning.  Patient's daughter was at bedside.  Remainder of family were communicated with via phone.  Patient is in substantial amount of pain.  She is seemingly unresponsive to intravenous doses of fentanyl and Dilaudid.  I had a long discussion with the patient and the family.  Recommended hospice/comfort measures.  Also recommended DNR status to which they are in agreement.  Regarding colonoscopy and endoscopy I explained that while the bleeding source may be identified and stopped it is likely that procedure such as this would not result in improve quality of life.  I did left the patient room and gave family time to come to a decision.  Was later informed that they elected to defer endoscopic evaluation at this time.  I have notified the gastroenterologist.  11/5: Patient seen and examined in the morning of discharge.  Currently pain controlled.  No labs checked this morning.  Lengthy conversation with the patient as well as his to family  members at bedside.  Explained the nature of her disease process.  Family and patient in agreement with discharge to residential hospice facility.  Patient continues to require use of intravenous narcotics for pain control.  At time of discharge she is receiving 1.5 mg IV Dilaudid every 2 hours as needed in addition to oral pain regimen.  Patient and family in agreement with discharge plan.  Patient discharged to residential hospice facility.  Discharge Diagnoses:  Principal Problem:   GIB (gastrointestinal bleeding) Active Problems:   Pressure injury of skin   Chronic systolic heart failure (HCC)   Tobacco use   Essential hypertension   Status post above-knee amputation (HCC)   Gastroesophageal reflux disease without esophagitis   AKA stump complication with right stump infection   Sepsis (HCC)  Intractable pain Likely secondary to infected stump and sacral wounds Patient is in severe pain requiring multiple doses of IV narcotic with minimal effect At time of discharge patient is requiring 1.5 mg IV Dilaudid every 2 hours as needed for pain control.  My suspicion is she will continue to require this until pain is better controlled.  In my estimation there is a possibility that patient could be transition to an oral pain regimen as she tolerates with possible plan to transition to home with hospice services.  At this time the patient's acuity and need for intravenous pain medication is too high to support dispo to home.  Patient discharged to inpatient hospice facility.   GIB (gastrointestinal bleeding): Hgb 9.2 -->6.1 -->5.9.  Dr. Servando Snare of GI is consulted Transfused 2 units packed red cells Hemoglobin improved to 9.8 After repeat discussions endoscopic evaluation is deferred at this time Patient was maintained on twice daily Protonix while hospitalized.  At  time of discharge this will be discontinued.  Will defer decision regarding continuation of PPI therapy to hospice medical  staff.  Sepsis due toAKA stump complication with right stump infection: pt is taking Keflex and Bactrim, but still has leukocytosis.  Patient meets criteria for sepsis with leukocytosis with WBC 18.2, tachycardia with HR 100.  Patient has sacral ulcers as well as a stump infection all potential sources for sepsis Currently hemodynamically stable at the time of discharge.  Will recommend 1 week of Bactrim DS 1 tab twice daily for cellulitic changes around the area of eschar with sacral ulcers and stump.  Wound care per hospice facility staff  Chronic systolic heart failure (HCC) 2D echo on 08/20/2015 showed EF of 40-45%.  Patient does not have leg edema. No pulmonary edema on chest x-ray.  CHF seem to be compensated. BNP 140.   Tobacco use -Nicotine patch  Essential hypertension Blood pressure 115/58 -holdlisinopril due to sepsis.  Discontinued on discharge  Gastroesophageal reflux disease without esophagitis -PPI discontinued on discharge.  Deferred discussion regarding continuation of medication to hospice staff   Discharge Instructions  Discharge Instructions    Diet - low sodium heart healthy   Complete by: As directed    Increase activity slowly   Complete by: As directed    No wound care   Complete by: As directed      Allergies as of 08/21/2020      Reactions   Contrast Media [iodinated Diagnostic Agents] Diarrhea, Nausea And Vomiting, Swelling, Rash   Morphine And Related Diarrhea, Nausea And Vomiting      Medication List    STOP taking these medications   aspirin 81 MG tablet   atorvastatin 40 MG tablet Commonly known as: LIPITOR   clopidogrel 75 MG tablet Commonly known as: PLAVIX   diphenhydrAMINE 25 mg capsule Commonly known as: BENADRYL   famotidine 20 MG tablet Commonly known as: PEPCID   ferrous sulfate 325 (65 FE) MG EC tablet   lisinopril 20 MG tablet Commonly known as: ZESTRIL   VITAMIN B-12 PO   Vitamin D-3 125 MCG  (5000 UT) Tabs     TAKE these medications   gabapentin 600 MG tablet Commonly known as: Neurontin Take 1 tablet (600 mg total) by mouth 2 (two) times daily.   magnesium hydroxide 400 MG/5ML suspension Commonly known as: MILK OF MAGNESIA Take 30 mLs by mouth daily as needed for mild constipation.   mirtazapine 7.5 MG tablet Commonly known as: REMERON Take 1 tablet (7.5 mg total) by mouth at bedtime.   oxyCODONE-acetaminophen 5-325 MG tablet Commonly known as: PERCOCET/ROXICET Take 1 tablet by mouth every 6 (six) hours as needed.   sulfamethoxazole-trimethoprim 800-160 MG tablet Commonly known as: BACTRIM DS Take 1 tablet by mouth 2 (two) times daily for 7 days.       Allergies  Allergen Reactions  . Contrast Media [Iodinated Diagnostic Agents] Diarrhea, Nausea And Vomiting, Swelling and Rash  . Morphine And Related Diarrhea and Nausea And Vomiting    Consultations:  GI   Procedures/Studies: DG Chest Port 1 View  Result Date: 08/19/2020 CLINICAL DATA:  Sepsis. EXAM: PORTABLE CHEST 1 VIEW COMPARISON:  November 08, 2015. FINDINGS: The heart size and mediastinal contours are within normal limits. Both lungs are clear. The visualized skeletal structures are unremarkable. IMPRESSION: No active disease. Electronically Signed   By: Lupita Raider M.D.   On: 08/19/2020 09:23    (Echo, Carotid, EGD, Colonoscopy, ERCP)    Subjective:  Seen and examined the day of discharge.  Stable, minimal distress from pain.  Much improved from prior.  Will discharge to residential hospice facility.  Discharge Exam: Vitals:   08/20/20 1156 08/20/20 2315  BP: 135/67 (!) 119/48  Pulse: 82 84  Resp: 15 16  Temp: 99.1 F (37.3 C) 98.9 F (37.2 C)  SpO2: 94% 90%   Vitals:   08/20/20 0825 08/20/20 0852 08/20/20 1156 08/20/20 2315  BP: (!) 121/109 (!) 133/57 135/67 (!) 119/48  Pulse: 91 94 82 84  Resp: Temp:   99.1 F (37.3 C) 98.9 F (37.2 C)  TempSrc:   Oral   SpO2:    94% 90%  Weight:      Height:        General: Patient is alert.  Mild distress due to pain Cardiovascular: RRR, S1/S2 +, no rubs, no gallops Respiratory: CTA bilaterally, no wheezing, no rhonchi Abdominal: Soft, NT, ND, bowel sounds + Extremities: Bilateral AKA.  Right stump infection with eschar.  Sacral ulcers with eschar formation    The results of significant diagnostics from this hospitalization (including imaging, microbiology, ancillary and laboratory) are listed below for reference.     Microbiology: Recent Results (from the past 240 hour(s))  Blood culture (routine single)     Status: None (Preliminary result)   Collection Time: 08/19/20  9:10 AM   Specimen: BLOOD  Result Value Ref Range Status   Specimen Description BLOOD BLOOD RIGHT FOREARM  Final   Special Requests   Final    BOTTLES DRAWN AEROBIC AND ANAEROBIC Blood Culture results may not be optimal due to an excessive volume of blood received in culture bottles   Culture   Final    NO GROWTH 2 DAYS Performed at [facility], [address], [place], [place] [address]    Report Status PENDING  Incomplete  Urine culture     Status: Abnormal   Collection Time: 08/19/20 12:03 PM   Specimen: Urine, Catheterized  Result Value Ref Range Status   Specimen Description   Final    URINE, CATHETERIZED Performed at [facility], [address], [place], [place] [address]    Special Requests   Final    NONE Performed at [facility], [address], [place], [place] [address]    Culture (A)  Final    <10,000 COLONIES/mL INSIGNIFICANT GROWTH Performed at [facility], [address], [place], [place] [address]    Report Status 08/20/2020 FINAL  Final  Respiratory Panel by RT PCR (Flu A&B, Covid) - Urine, Catheterized     Status: None   Collection Time: 08/19/20  1:50 PM   Specimen: Urine, Catheterized; Nasopharyngeal  Result Value Ref Range Status   SARS Coronavirus 2 by RT PCR  NEGATIVE NEGATIVE Final    Comment: (NOTE) SARS-CoV-2 target nucleic acids are NOT DETECTED.  The SARS-CoV-2 RNA is generally detectable in upper respiratoy specimens during the acute phase of infection. The lowest concentration of SARS-CoV-2 viral copies this assay can detect is 131 copies/mL. A negative result does not preclude SARS-Cov-2 infection and should not be used as the sole basis for treatment or other patient management decisions. A negative result may occur with  improper specimen collection/handling, submission of specimen other than nasopharyngeal swab, presence of viral mutation(s) within the areas targeted by this assay, and inadequate number of viral copies (<131 copies/mL). A negative result must be combined with clinical observations, patient history, and  epidemiological information. The expected result is Negative.  Fact Sheet for Patients:  https://www.moore.com/  Fact Sheet for Healthcare Providers:  https://www.young.biz/  This test is no t yet approved or cleared by the [place] FDA and  has been authorized for detection and/or diagnosis of SARS-CoV-2 by FDA under an Emergency Use Authorization (EUA). This EUA will remain  in effect (meaning this test can be used) for the duration of the COVID-19 declaration under Section 564(b)(1) of the Act, 21 U.S.C. section 360bbb-3(b)(1), unless the authorization is terminated or revoked sooner.     Influenza A by PCR NEGATIVE NEGATIVE Final   Influenza B by PCR NEGATIVE NEGATIVE Final    Comment: (NOTE) The Xpert Xpress SARS-CoV-2/FLU/RSV assay is intended as an aid in  the diagnosis of influenza from Nasopharyngeal swab specimens and  should not be used as a sole basis for treatment. Nasal washings and  aspirates are unacceptable for Xpert Xpress SARS-CoV-2/FLU/RSV  testing.  Fact Sheet for Patients: https://www.moore.com/  Fact Sheet for  Healthcare Providers: https://www.young.biz/  This test is not yet approved or cleared by the [place] FDA and  has been authorized for detection and/or diagnosis of SARS-CoV-2 by  FDA under an Emergency Use Authorization (EUA). This EUA will remain  in effect (meaning this test can be used) for the duration of the  Covid-19 declaration under Section 564(b)(1) of the Act, 21  U.S.C. section 360bbb-3(b)(1), unless the authorization is  terminated or revoked. Performed at [facility], [address] [place], [place] [address]      Labs: BNP (last 3 results) Recent Labs    08/19/20 0910  BNP 140.5*   Basic Metabolic Panel: Recent Labs  Lab 08/19/20 0910 08/20/20 0402  NA 137 134*  K 4.5 4.0  CL 104 104  CO2 23 21*  GLUCOSE 102* 81  BUN 25* 22  CREATININE 0.72 0.70  CALCIUM 9.3 9.0   Liver Function Tests: Recent Labs  Lab 08/19/20 0910  AST 57*  ALT 40  ALKPHOS 91  BILITOT 0.4  PROT 6.8  ALBUMIN 2.7*   No results for input(s): LIPASE, AMYLASE in the last 168 hours. No results for input(s): AMMONIA in the last 168 hours. CBC: Recent Labs  Lab 08/19/20 0910 08/19/20 1204 08/19/20 1922 08/20/20 0402 08/20/20 1237  WBC 18.2* 16.8* 15.8* 14.4* 17.9*  NEUTROABS 15.7*  --   --   --   --   HGB 6.1* 5.9* 7.9* 9.8* 10.4*  HCT 21.6* 20.3* 26.1* 31.6* 33.3*  MCV 78.3* 77.8* 79.6* 79.8* 80.8  PLT 489* 462* 379 370 376   Cardiac Enzymes: No results for input(s): CKTOTAL, CKMB, CKMBINDEX, TROPONINI in the last 168 hours. BNP: Invalid input(s): POCBNP CBG: Recent Labs  Lab 08/20/20 0759 08/20/20 0850 08/21/20 0802  GLUCAP 69* 193* 72   D-Dimer No results for input(s): DDIMER in the last 72 hours. Hgb A1c No results for input(s): HGBA1C in the last 72 hours. Lipid Profile No results for input(s): CHOL, HDL, LDLCALC, TRIG, CHOLHDL, LDLDIRECT in the last 72 hours. Thyroid function studies No results for input(s): TSH,  T4TOTAL, T3FREE, THYROIDAB in the last 72 hours.  Invalid input(s): FREET3 Anemia work up No results for input(s): VITAMINB12, FOLATE, FERRITIN, TIBC, IRON, RETICCTPCT in the last 72 hours. Urinalysis    Component Value Date/Time   COLORURINE YELLOW (A) 08/19/2020 1203   APPEARANCEUR CLEAR (A) 08/19/2020 1203   APPEARANCEUR Cloudy 02/12/2015 2217   LABSPEC 1.018 08/19/2020 1203   LABSPEC  1.015 02/12/2015 2217   PHURINE 5.0 08/19/2020 1203   GLUCOSEU NEGATIVE 08/19/2020 1203   GLUCOSEU Negative 02/12/2015 2217   HGBUR NEGATIVE 08/19/2020 1203   BILIRUBINUR NEGATIVE 08/19/2020 1203   BILIRUBINUR Negative 02/12/2015 2217   KETONESUR NEGATIVE 08/19/2020 1203   PROTEINUR NEGATIVE 08/19/2020 1203   NITRITE NEGATIVE 08/19/2020 1203   LEUKOCYTESUR NEGATIVE 08/19/2020 1203   LEUKOCYTESUR 3+ 02/12/2015 2217   Sepsis Labs Invalid input(s): PROCALCITONIN,  WBC,  LACTICIDVEN Microbiology Recent Results (from the past 240 hour(s))  Blood culture (routine single)     Status: None (Preliminary result)   Collection Time: 08/19/20  9:10 AM   Specimen: BLOOD  Result Value Ref Range Status   Specimen Description BLOOD BLOOD RIGHT FOREARM  Final   Special Requests   Final    BOTTLES DRAWN AEROBIC AND ANAEROBIC Blood Culture results may not be optimal due to an excessive volume of blood received in culture bottles   Culture   Final    NO GROWTH 2 DAYS Performed at [facility], [address], [place], [place] [address]    Report Status PENDING  Incomplete  Urine culture     Status: Abnormal   Collection Time: 08/19/20 12:03 PM   Specimen: Urine, Catheterized  Result Value Ref Range Status   Specimen Description   Final    URINE, CATHETERIZED Performed at [facility], [address] [place], [place] [address]    Special Requests   Final    NONE Performed at [facility], [address], [place], [place] [address]    Culture (A)  Final    <10,000  COLONIES/mL INSIGNIFICANT GROWTH Performed at [facility], [address] [place], [place] [address]    Report Status 08/20/2020 FINAL  Final  Respiratory Panel by RT PCR (Flu A&B, Covid) - Urine, Catheterized     Status: None   Collection Time: 08/19/20  1:50 PM   Specimen: Urine, Catheterized; Nasopharyngeal  Result Value Ref Range Status   SARS Coronavirus 2 by RT PCR NEGATIVE NEGATIVE Final    Comment: (NOTE) SARS-CoV-2 target nucleic acids are NOT DETECTED.  The SARS-CoV-2 RNA is generally detectable in upper respiratoy specimens during the acute phase of infection. The lowest concentration of SARS-CoV-2 viral copies this assay can detect is 131 copies/mL. A negative result does not preclude SARS-Cov-2 infection and should not be used as the sole basis for treatment or other patient management decisions. A negative result may occur with  improper specimen collection/handling, submission of specimen other than nasopharyngeal swab, presence of viral mutation(s) within the areas targeted by this assay, and inadequate number of viral copies (<131 copies/mL). A negative result must be combined with clinical observations, patient history, and epidemiological information. The expected result is Negative.  Fact Sheet for Patients:  https://www.moore.com/  Fact Sheet for Healthcare Providers:  https://www.young.biz/  This test is no t yet approved or cleared by the [place] FDA and  has been authorized for detection and/or diagnosis of SARS-CoV-2 by FDA under an Emergency Use Authorization (EUA). This EUA will remain  in effect (meaning this test can be used) for the duration of the COVID-19 declaration under Section 564(b)(1) of the Act, 21 U.S.C. section 360bbb-3(b)(1), unless the authorization is terminated or revoked sooner.     Influenza A by PCR NEGATIVE NEGATIVE Final   Influenza B by PCR NEGATIVE NEGATIVE Final     Comment: (NOTE) The Xpert Xpress SARS-CoV-2/FLU/RSV assay is intended as an  aid in  the diagnosis of influenza from Nasopharyngeal swab specimens and  should not be used as a sole basis for treatment. Nasal washings and  aspirates are unacceptable for Xpert Xpress SARS-CoV-2/FLU/RSV  testing.  Fact Sheet for Patients: https://www.moore.com/https://www.fda.gov/media/142436/download  Fact Sheet for Healthcare Providers: https://www.young.biz/https://www.fda.gov/media/142435/download  This test is not yet approved or cleared by the [place] FDA and  has been authorized for detection and/or diagnosis of SARS-CoV-2 by  FDA under an Emergency Use Authorization (EUA). This EUA will remain  in effect (meaning this test can be used) for the duration of the  Covid-19 declaration under Section 564(b)(1) of the Act, 21  U.S.C. section 360bbb-3(b)(1), unless the authorization is  terminated or revoked. Performed at [facility], [address] [place], [place] [id number]      Time coordinating discharge: Over 30 minutes  SIGNED:   Tresa MooreSudheer B [place], MD  [facility] 08/21/2020, 10:36 AM Pager   If 7PM-7AM, please contact night-coverage

## 2020-08-21 NOTE — Progress Notes (Signed)
Report given to Steward Drone, RN at [facility] facility.

## 2020-08-23 MED FILL — Cholecalciferol Tab 25 MCG (1000 Unit): ORAL | Qty: 5 | Status: AC

## 2020-08-24 LAB — CULTURE, BLOOD (SINGLE): Culture: NO GROWTH

## 2020-08-30 ENCOUNTER — Ambulatory Visit (INDEPENDENT_AMBULATORY_CARE_PROVIDER_SITE_OTHER): Payer: Medicare HMO | Admitting: Vascular Surgery

## 2020-09-06 ENCOUNTER — Other Ambulatory Visit: Payer: Medicare HMO | Admitting: Adult Health Nurse Practitioner

## 2020-09-09 ENCOUNTER — Other Ambulatory Visit: Payer: Self-pay | Admitting: Nurse Practitioner

## 2020-09-09 DIAGNOSIS — F321 Major depressive disorder, single episode, moderate: Secondary | ICD-10-CM

## 2020-09-20 ENCOUNTER — Other Ambulatory Visit: Payer: Self-pay

## 2020-09-20 ENCOUNTER — Ambulatory Visit: Payer: Medicare HMO | Admitting: Nurse Practitioner

## 2020-11-08 ENCOUNTER — Telehealth: Payer: Self-pay

## 2020-11-08 NOTE — Telephone Encounter (Signed)
Patient passed away with hospice care. [name]

## 2020-11-16 DEATH — deceased

## 2020-11-30 ENCOUNTER — Telehealth: Payer: Self-pay

## 2020-11-30 NOTE — Telephone Encounter (Signed)
Mailed patient records to [facility] on 11/30/2020 at [address] [place] [address] 

## 2021-08-11 ENCOUNTER — Ambulatory Visit: Payer: Medicare HMO | Admitting: Hospice and Palliative Medicine
